# Patient Record
Sex: Female | Born: 1945
Health system: Southern US, Community
[De-identification: ages and names within clinical notes are randomized; demographics above are authoritative.]

## PROBLEM LIST (undated history)

## (undated) DIAGNOSIS — F419 Anxiety disorder, unspecified: Secondary | ICD-10-CM

## (undated) DIAGNOSIS — C50511 Malignant neoplasm of lower-outer quadrant of right female breast: Secondary | ICD-10-CM

## (undated) DIAGNOSIS — I340 Nonrheumatic mitral (valve) insufficiency: Secondary | ICD-10-CM

## (undated) DIAGNOSIS — I499 Cardiac arrhythmia, unspecified: Secondary | ICD-10-CM

## (undated) DIAGNOSIS — M1990 Unspecified osteoarthritis, unspecified site: Secondary | ICD-10-CM

## (undated) DIAGNOSIS — C50919 Malignant neoplasm of unspecified site of unspecified female breast: Secondary | ICD-10-CM

## (undated) DIAGNOSIS — R922 Inconclusive mammogram: Secondary | ICD-10-CM

## (undated) DIAGNOSIS — H269 Unspecified cataract: Secondary | ICD-10-CM

## (undated) DIAGNOSIS — E039 Hypothyroidism, unspecified: Secondary | ICD-10-CM

## (undated) DIAGNOSIS — I059 Rheumatic mitral valve disease, unspecified: Secondary | ICD-10-CM

## (undated) DIAGNOSIS — G629 Polyneuropathy, unspecified: Secondary | ICD-10-CM

## (undated) DIAGNOSIS — R011 Cardiac murmur, unspecified: Secondary | ICD-10-CM

## (undated) DIAGNOSIS — E079 Disorder of thyroid, unspecified: Secondary | ICD-10-CM

## (undated) DIAGNOSIS — R202 Paresthesia of skin: Secondary | ICD-10-CM

## (undated) DIAGNOSIS — G250 Essential tremor: Secondary | ICD-10-CM

## (undated) DIAGNOSIS — M5412 Radiculopathy, cervical region: Secondary | ICD-10-CM

## (undated) DIAGNOSIS — Z923 Personal history of irradiation: Secondary | ICD-10-CM

## (undated) DIAGNOSIS — G2581 Restless legs syndrome: Secondary | ICD-10-CM

## (undated) DIAGNOSIS — I491 Atrial premature depolarization: Secondary | ICD-10-CM

## (undated) DIAGNOSIS — R251 Tremor, unspecified: Secondary | ICD-10-CM

## (undated) DIAGNOSIS — I341 Nonrheumatic mitral (valve) prolapse: Secondary | ICD-10-CM

## (undated) DIAGNOSIS — C801 Malignant (primary) neoplasm, unspecified: Secondary | ICD-10-CM

## (undated) DIAGNOSIS — Z170 Estrogen receptor positive status [ER+]: Secondary | ICD-10-CM

## (undated) DIAGNOSIS — N3010 Interstitial cystitis (chronic) without hematuria: Secondary | ICD-10-CM

## (undated) DIAGNOSIS — I83891 Varicose veins of right lower extremities with other complications: Secondary | ICD-10-CM

## (undated) DIAGNOSIS — Z853 Personal history of malignant neoplasm of breast: Secondary | ICD-10-CM

## (undated) HISTORY — DX: Polyneuropathy, unspecified: G62.9

## (undated) HISTORY — PX: EYE SURGERY: SHX253

## (undated) HISTORY — DX: Palpitations: R00.2

## (undated) HISTORY — DX: Unspecified cataract: H26.9

## (undated) HISTORY — DX: Disorder of thyroid, unspecified: E07.9

## (undated) HISTORY — DX: Radiculopathy, cervical region: M54.12

## (undated) HISTORY — DX: Malignant neoplasm of lower-outer quadrant of right female breast: C50.511

## (undated) HISTORY — PX: TONSILLECTOMY: SUR1361

## (undated) HISTORY — DX: Essential tremor: G25.0

## (undated) HISTORY — DX: Atrial premature depolarization: I49.1

## (undated) HISTORY — PX: BREAST LUMPECTOMY: SHX2

## (undated) HISTORY — DX: Interstitial cystitis (chronic) without hematuria: N30.10

## (undated) HISTORY — DX: Nonrheumatic mitral (valve) insufficiency: I34.0

## (undated) HISTORY — PX: DILATION AND CURETTAGE OF UTERUS: SHX78

## (undated) HISTORY — DX: Tremor, unspecified: R25.1

## (undated) HISTORY — DX: Estrogen receptor positive status (ER+): Z17.0

## (undated) HISTORY — DX: Nonrheumatic mitral (valve) prolapse: I34.1

## (undated) HISTORY — PX: CORONARY ANGIOPLASTY: SHX604

## (undated) HISTORY — DX: Rheumatic mitral valve disease, unspecified: I05.9

## (undated) HISTORY — PX: CARDIAC CATHETERIZATION: SHX172

## (undated) HISTORY — DX: Restless legs syndrome: G25.81

## (undated) HISTORY — DX: Malignant neoplasm of unspecified site of unspecified female breast: C50.919

## (undated) HISTORY — PX: OTHER SURGICAL HISTORY: SHX169

## (undated) HISTORY — DX: Inconclusive mammogram: R92.2

## (undated) HISTORY — DX: Anxiety disorder, unspecified: F41.9

## (undated) HISTORY — DX: Unspecified osteoarthritis, unspecified site: M19.90

## (undated) HISTORY — DX: Paresthesia of skin: R20.2

## (undated) HISTORY — PX: SHOULDER ARTHROSCOPY: SHX128

## (undated) HISTORY — DX: Varicose veins of right lower extremity with other complications: I83.891

## (undated) HISTORY — DX: Personal history of malignant neoplasm of breast: Z85.3

## (undated) SURGICAL SUPPLY — 2 items
WATCHMAN FLX PRO PROCEDURE (KITS) ×1 IMPLANT
WATCHMAN TRUSTEER PROCEDURE (KITS) ×1 IMPLANT

---

## 1978-09-29 HISTORY — PX: MELANOMA EXCISION: SHX5266

## 1988-09-29 HISTORY — PX: HEMANGIOMA EXCISION: SHX1734

## 1998-10-17 ENCOUNTER — Other Ambulatory Visit: Admission: RE | Admit: 1998-10-17 | Discharge: 1998-10-17 | Payer: Self-pay | Admitting: Obstetrics and Gynecology

## 1999-10-24 ENCOUNTER — Other Ambulatory Visit: Admission: RE | Admit: 1999-10-24 | Discharge: 1999-10-24 | Payer: Self-pay | Admitting: Obstetrics and Gynecology

## 2002-11-28 ENCOUNTER — Other Ambulatory Visit: Admission: RE | Admit: 2002-11-28 | Discharge: 2002-11-28 | Payer: Self-pay | Admitting: Obstetrics & Gynecology

## 2004-01-22 ENCOUNTER — Other Ambulatory Visit: Admission: RE | Admit: 2004-01-22 | Discharge: 2004-01-22 | Payer: Self-pay | Admitting: Obstetrics & Gynecology

## 2005-01-30 ENCOUNTER — Other Ambulatory Visit: Admission: RE | Admit: 2005-01-30 | Discharge: 2005-01-30 | Payer: Self-pay | Admitting: Obstetrics & Gynecology

## 2007-07-07 ENCOUNTER — Ambulatory Visit: Payer: Self-pay | Admitting: Internal Medicine

## 2007-07-22 ENCOUNTER — Ambulatory Visit: Payer: Self-pay | Admitting: Internal Medicine

## 2007-09-30 HISTORY — PX: BREAST BIOPSY: SHX20

## 2007-11-15 ENCOUNTER — Ambulatory Visit: Payer: Self-pay | Admitting: Internal Medicine

## 2007-11-26 ENCOUNTER — Ambulatory Visit: Payer: Self-pay

## 2007-11-26 ENCOUNTER — Encounter: Payer: Self-pay | Admitting: Internal Medicine

## 2007-12-24 ENCOUNTER — Ambulatory Visit: Payer: Self-pay | Admitting: Internal Medicine

## 2007-12-28 ENCOUNTER — Encounter: Admission: RE | Admit: 2007-12-28 | Discharge: 2007-12-28 | Payer: Self-pay | Admitting: Internal Medicine

## 2007-12-29 ENCOUNTER — Ambulatory Visit: Payer: Self-pay | Admitting: Internal Medicine

## 2008-01-04 ENCOUNTER — Encounter: Payer: Self-pay | Admitting: Internal Medicine

## 2008-01-04 ENCOUNTER — Ambulatory Visit: Payer: Self-pay | Admitting: Internal Medicine

## 2008-02-04 ENCOUNTER — Encounter: Admission: RE | Admit: 2008-02-04 | Discharge: 2008-02-04 | Payer: Self-pay | Admitting: Internal Medicine

## 2008-07-11 ENCOUNTER — Encounter: Admission: RE | Admit: 2008-07-11 | Discharge: 2008-07-11 | Payer: Self-pay | Admitting: Obstetrics & Gynecology

## 2008-07-12 ENCOUNTER — Encounter: Admission: RE | Admit: 2008-07-12 | Discharge: 2008-07-12 | Payer: Self-pay | Admitting: Obstetrics & Gynecology

## 2008-07-12 ENCOUNTER — Encounter (INDEPENDENT_AMBULATORY_CARE_PROVIDER_SITE_OTHER): Payer: Self-pay | Admitting: Diagnostic Radiology

## 2008-07-20 ENCOUNTER — Encounter: Admission: RE | Admit: 2008-07-20 | Discharge: 2008-07-20 | Payer: Self-pay | Admitting: Obstetrics & Gynecology

## 2008-07-25 ENCOUNTER — Encounter: Admission: RE | Admit: 2008-07-25 | Discharge: 2008-07-25 | Payer: Self-pay | Admitting: General Surgery

## 2008-07-25 ENCOUNTER — Ambulatory Visit (HOSPITAL_BASED_OUTPATIENT_CLINIC_OR_DEPARTMENT_OTHER): Admission: RE | Admit: 2008-07-25 | Discharge: 2008-07-25 | Payer: Self-pay | Admitting: General Surgery

## 2008-07-25 ENCOUNTER — Encounter (INDEPENDENT_AMBULATORY_CARE_PROVIDER_SITE_OTHER): Payer: Self-pay | Admitting: General Surgery

## 2008-07-25 DIAGNOSIS — C50919 Malignant neoplasm of unspecified site of unspecified female breast: Secondary | ICD-10-CM

## 2008-07-25 HISTORY — DX: Malignant neoplasm of unspecified site of unspecified female breast: C50.919

## 2008-07-25 HISTORY — PX: BREAST LUMPECTOMY: SHX2

## 2008-08-01 ENCOUNTER — Ambulatory Visit: Payer: Self-pay | Admitting: Oncology

## 2008-08-16 ENCOUNTER — Encounter: Payer: Self-pay | Admitting: Internal Medicine

## 2008-08-16 LAB — CBC WITH DIFFERENTIAL/PLATELET
Basophils Absolute: 0 10*3/uL (ref 0.0–0.1)
EOS%: 3.8 % (ref 0.0–7.0)
Eosinophils Absolute: 0.2 10*3/uL (ref 0.0–0.5)
HGB: 13.7 g/dL (ref 11.6–15.9)
LYMPH%: 27 % (ref 14.0–48.0)
MCH: 30.2 pg (ref 26.0–34.0)
MCV: 88 fL (ref 81.0–101.0)
MONO%: 12.5 % (ref 0.0–13.0)
NEUT#: 2.6 10*3/uL (ref 1.5–6.5)
NEUT%: 56.1 % (ref 39.6–76.8)
Platelets: 289 10*3/uL (ref 145–400)
RDW: 13.6 % (ref 11.3–14.5)

## 2008-08-17 LAB — CANCER ANTIGEN 27.29: CA 27.29: 32 U/mL (ref 0–39)

## 2008-08-17 LAB — COMPREHENSIVE METABOLIC PANEL
AST: 25 U/L (ref 0–37)
Albumin: 4.5 g/dL (ref 3.5–5.2)
Alkaline Phosphatase: 74 U/L (ref 39–117)
BUN: 12 mg/dL (ref 6–23)
Creatinine, Ser: 0.77 mg/dL (ref 0.40–1.20)
Glucose, Bld: 69 mg/dL — ABNORMAL LOW (ref 70–99)
Potassium: 3.8 mEq/L (ref 3.5–5.3)
Total Bilirubin: 0.5 mg/dL (ref 0.3–1.2)

## 2008-08-17 LAB — VITAMIN D 25 HYDROXY (VIT D DEFICIENCY, FRACTURES): Vit D, 25-Hydroxy: 58 ng/mL (ref 30–89)

## 2008-08-18 ENCOUNTER — Ambulatory Visit: Admission: RE | Admit: 2008-08-18 | Discharge: 2008-11-05 | Payer: Self-pay | Admitting: Radiation Oncology

## 2008-10-13 ENCOUNTER — Ambulatory Visit: Payer: Self-pay | Admitting: Oncology

## 2008-10-17 ENCOUNTER — Encounter: Payer: Self-pay | Admitting: Internal Medicine

## 2008-10-23 ENCOUNTER — Ambulatory Visit: Payer: Self-pay | Admitting: Internal Medicine

## 2009-02-06 ENCOUNTER — Ambulatory Visit: Payer: Self-pay | Admitting: Oncology

## 2009-02-08 LAB — CBC WITH DIFFERENTIAL/PLATELET
BASO%: 0.2 % (ref 0.0–2.0)
EOS%: 1.7 % (ref 0.0–7.0)
LYMPH%: 19.8 % (ref 14.0–49.7)
MCH: 30.4 pg (ref 25.1–34.0)
MCHC: 33.9 g/dL (ref 31.5–36.0)
MONO#: 0.5 10*3/uL (ref 0.1–0.9)
RBC: 4.05 10*6/uL (ref 3.70–5.45)
WBC: 4.5 10*3/uL (ref 3.9–10.3)
lymph#: 0.9 10*3/uL (ref 0.9–3.3)

## 2009-02-09 LAB — COMPREHENSIVE METABOLIC PANEL
ALT: 11 U/L (ref 0–35)
AST: 18 U/L (ref 0–37)
Alkaline Phosphatase: 43 U/L (ref 39–117)
BUN: 11 mg/dL (ref 6–23)
Calcium: 9.2 mg/dL (ref 8.4–10.5)
Chloride: 108 mEq/L (ref 96–112)
Creatinine, Ser: 0.85 mg/dL (ref 0.40–1.20)
Potassium: 4.1 mEq/L (ref 3.5–5.3)

## 2009-02-09 LAB — CANCER ANTIGEN 27.29: CA 27.29: 20 U/mL (ref 0–39)

## 2009-02-15 ENCOUNTER — Encounter: Payer: Self-pay | Admitting: Internal Medicine

## 2009-02-23 ENCOUNTER — Encounter (INDEPENDENT_AMBULATORY_CARE_PROVIDER_SITE_OTHER): Payer: Self-pay | Admitting: *Deleted

## 2009-04-09 ENCOUNTER — Encounter: Payer: Self-pay | Admitting: Internal Medicine

## 2009-05-08 ENCOUNTER — Ambulatory Visit: Payer: Self-pay | Admitting: Internal Medicine

## 2009-05-08 DIAGNOSIS — I059 Rheumatic mitral valve disease, unspecified: Secondary | ICD-10-CM

## 2009-05-08 DIAGNOSIS — I491 Atrial premature depolarization: Secondary | ICD-10-CM

## 2009-05-08 DIAGNOSIS — I340 Nonrheumatic mitral (valve) insufficiency: Secondary | ICD-10-CM | POA: Insufficient documentation

## 2009-05-08 HISTORY — DX: Rheumatic mitral valve disease, unspecified: I05.9

## 2009-05-08 HISTORY — DX: Atrial premature depolarization: I49.1

## 2009-07-12 ENCOUNTER — Encounter: Admission: RE | Admit: 2009-07-12 | Discharge: 2009-07-12 | Payer: Self-pay | Admitting: Obstetrics & Gynecology

## 2009-07-16 ENCOUNTER — Ambulatory Visit: Payer: Self-pay | Admitting: Oncology

## 2009-07-18 ENCOUNTER — Encounter: Payer: Self-pay | Admitting: Internal Medicine

## 2009-07-18 LAB — RESEARCH LABS

## 2009-09-10 ENCOUNTER — Telehealth (INDEPENDENT_AMBULATORY_CARE_PROVIDER_SITE_OTHER): Payer: Self-pay | Admitting: *Deleted

## 2009-09-11 ENCOUNTER — Ambulatory Visit (HOSPITAL_COMMUNITY): Admission: RE | Admit: 2009-09-11 | Discharge: 2009-09-11 | Payer: Self-pay | Admitting: Neurosurgery

## 2009-10-30 HISTORY — PX: NECK SURGERY: SHX720

## 2009-11-09 ENCOUNTER — Encounter: Admission: RE | Admit: 2009-11-09 | Discharge: 2009-11-09 | Payer: Self-pay | Admitting: Neurosurgery

## 2010-02-18 ENCOUNTER — Ambulatory Visit: Payer: Self-pay | Admitting: Oncology

## 2010-02-19 LAB — CBC WITH DIFFERENTIAL/PLATELET
Basophils Absolute: 0 10*3/uL (ref 0.0–0.1)
EOS%: 2.9 % (ref 0.0–7.0)
HCT: 37.4 % (ref 34.8–46.6)
HGB: 12.8 g/dL (ref 11.6–15.9)
MCH: 30.9 pg (ref 25.1–34.0)
NEUT%: 60.6 % (ref 38.4–76.8)
lymph#: 1.3 10*3/uL (ref 0.9–3.3)

## 2010-02-20 LAB — COMPREHENSIVE METABOLIC PANEL
ALT: 17 U/L (ref 0–35)
Albumin: 4.3 g/dL (ref 3.5–5.2)
Alkaline Phosphatase: 42 U/L (ref 39–117)
Chloride: 104 mEq/L (ref 96–112)
Glucose, Bld: 85 mg/dL (ref 70–99)
Total Bilirubin: 0.4 mg/dL (ref 0.3–1.2)
Total Protein: 7.1 g/dL (ref 6.0–8.3)

## 2010-02-26 ENCOUNTER — Encounter: Payer: Self-pay | Admitting: Internal Medicine

## 2010-03-12 ENCOUNTER — Ambulatory Visit: Payer: Self-pay | Admitting: Internal Medicine

## 2010-04-03 ENCOUNTER — Ambulatory Visit (HOSPITAL_COMMUNITY): Admission: RE | Admit: 2010-04-03 | Discharge: 2010-04-03 | Payer: Self-pay | Admitting: Internal Medicine

## 2010-04-03 ENCOUNTER — Encounter: Payer: Self-pay | Admitting: Internal Medicine

## 2010-04-03 ENCOUNTER — Ambulatory Visit: Payer: Self-pay | Admitting: Internal Medicine

## 2010-04-03 ENCOUNTER — Ambulatory Visit: Payer: Self-pay | Admitting: Cardiology

## 2010-07-17 ENCOUNTER — Encounter: Admission: RE | Admit: 2010-07-17 | Discharge: 2010-07-17 | Payer: Self-pay | Admitting: Oncology

## 2010-08-15 ENCOUNTER — Ambulatory Visit: Payer: Self-pay | Admitting: Oncology

## 2010-08-19 LAB — CBC WITH DIFFERENTIAL/PLATELET
BASO%: 0.3 % (ref 0.0–2.0)
Basophils Absolute: 0 10*3/uL (ref 0.0–0.1)
EOS%: 2.3 % (ref 0.0–7.0)
HCT: 37.8 % (ref 34.8–46.6)
HGB: 12.7 g/dL (ref 11.6–15.9)
LYMPH%: 24.5 % (ref 14.0–49.7)
MCH: 30.7 pg (ref 25.1–34.0)
MCHC: 33.7 g/dL (ref 31.5–36.0)
MCV: 91.3 fL (ref 79.5–101.0)
MONO%: 11 % (ref 0.0–14.0)
NEUT%: 61.9 % (ref 38.4–76.8)
Platelets: 182 10*3/uL (ref 145–400)
lymph#: 1 10*3/uL (ref 0.9–3.3)

## 2010-08-19 LAB — COMPREHENSIVE METABOLIC PANEL
AST: 22 U/L (ref 0–37)
BUN: 13 mg/dL (ref 6–23)
Calcium: 9.7 mg/dL (ref 8.4–10.5)
Chloride: 105 mEq/L (ref 96–112)
Creatinine, Ser: 0.82 mg/dL (ref 0.40–1.20)
Total Bilirubin: 0.3 mg/dL (ref 0.3–1.2)

## 2010-08-28 ENCOUNTER — Encounter: Payer: Self-pay | Admitting: Internal Medicine

## 2010-10-31 NOTE — Assessment & Plan Note (Signed)
Summary: rov/jss  Medications Added VITAMIN D 1000 UNIT TABS (CHOLECALCIFEROL) 1 tab 4 days weekly * OMEGA 3 1000MG   (OMEGA-3 FATTY ACIDS) 1 tab by mouth two times a day MACRODANTIN 50 MG CAPS (NITROFURANTOIN MACROCRYSTAL) 1 tab by mouth once daily BIOTIN 5000 MCG CAPS (BIOTIN) 1 tab by mouth once daily MAGNESIUM 250 MG TABS (MAGNESIUM) 1 tab by mouth once daily * ELAVIL 25MG  1 tab by mouth once daily        Primary Provider:  Buren Kos MD  CC:  check up.  History of Present Illness: Kellie Humphrey is a very pleasant 65 year old woman with a history of mild mitral valve prolapse and associated PACs.   She had an exercise echo done for chest pain in 2009, this showed good exercise capacity with normal stress echo images.  She did have occasional PACs with brief 5-6 beat run of atrial tachycardia.   Last year we increased her lopressor due to palpitations. She returns for routine f/u. Feels good with few palpitations. Occasionally feels a hum with her heartbeat.  Walks 4 miles 4 days per week without problem. No CP or undue SOB. No edema, syncope, presyncope. Having hot flashes with Tamoxifen consider trying Neurontin.   Current Medications (verified): 1)  Metoprolol Tartrate 25 Mg Tabs (Metoprolol Tartrate) .[name].. 1 Tab Two Times A Day 2)  Aspirin 81 Mg Tbec (Aspirin) .... Take One Tablet By Mouth Daily 3)  Multivitamins   Tabs (Multiple Vitamin) .... Take One Tablet By Mouth Once Daily. 4)  Vitamin D 1000 Unit Tabs (Cholecalciferol) .[name].. 1 Tab 4 Days Weekly 5)  Omega 3 1000mg   (Omega-3 Fatty Acids) .[name].. 1 Tab By Mouth Two Times A Day 6)  Calcium Citrate   Powd (Calcium Citrate Tetrahydrate) .... 100mg  Daily 7)  Tamoxifen Citrate 20 Mg Tabs (Tamoxifen Citrate) .... Take One Tablet By Mouth Once Daily. 8)  Flexeril 10 Mg Tabs (Cyclobenzaprine Hcl) .... As Needed 9)  Macrodantin 50 Mg Caps (Nitrofurantoin Macrocrystal) .[name].. 1 Tab By Mouth Once Daily 10)  Biotin 5000 Mcg Caps (Biotin) .[name].. 1  Tab By Mouth Once Daily 11)  Magnesium 250 Mg Tabs (Magnesium) .[name].. 1 Tab By Mouth Once Daily 12)  Elavil 25mg  .... 1 Tab By Mouth Once Daily  Allergies: 1)  ! Codeine  Past History:  Past Medical History: Last updated: 05/08/2009 Breast CA s/p lumpectomy 2009     --adjuvant radiation      --on Tamoxifen Mitral valve prolapse Palpitations due to PACs  Past Surgical History: Last updated: 05/04/2009  DATE OF PROCEDURE:  07/25/2008   PROCEDURES:   1. Right breast wire-guided lumpectomy.   2. Right axillary sentinel lymph node biopsy with radioactive tracer       and methylene blue dye.     SURGEON:  Juanetta Gosling, MD   Review of Systems        As per HPI and past medical history; otherwise all systems negative.   Vital Signs:  Patient profile:   65 year old female Height:      68 inches Weight:      137 pounds BMI:     20.91 Pulse rate:   75 / minute Resp:     12 per minute BP sitting:   100 / 60  (left arm)  Vitals Entered By: Kem Parkinson (March 12, 2010 7:59 AM)  Physical Exam  General:  Thin. well appearing in no acute distress, ambulates around the clinic without any respiratory difficulty. HEENT:  Normal. NECK:  Supple.  There is no JVD.  Carotids are 2+ bilaterally without any bruits.  There is no lymphadenopathy or thyromegaly. CARDIAC:  PMI is nondisplaced.  She is regular rate and rhythm.  There is a very soft midsystolic click. no murmur LUNGS:  Clear. ABDOMEN:  Soft, nontender, and nondistended.  No hepatosplenomegaly.  No bruits.  No masses.  Good bowel sounds. EXTREMITIES:  Warm with no cyanosis, clubbing, or edema.  No rash. NEURO:  Alert and oriented x3.  Cranial nerves II through XII are intact.  Moves all 4 extremities without difficulty.  Affect is pleasant.   Impression & Recommendations:  Problem # 1:  PREMATURE ATRIAL CONTRACTIONS (ICD-427.61) Stable. Continue b-blocker.  Problem # 2:  MITRAL VALVE PROLAPSE (ICD-424.0) Stable  on exam. She is due for her repeat echo.   Other Orders: EKG w/ Interpretation (93000) Echocardiogram (Echo)  Patient Instructions: 1)  Your physician recommends that you schedule a follow-up appointment in: 79yr with Dr Gala Romney 2)  Your physician recommends that you continue on your current medications as directed. Please refer to the Current Medication list given to you today. 3)  Your physician has requested that you have an echocardiogram.  Echocardiography is a painless test that uses sound waves to create images of your heart. It provides your doctor with information about the size and shape of your heart and how well your heart's chambers and valves are working.  This procedure takes approximately one hour. There are no restrictions for this procedure.

## 2010-10-31 NOTE — Letter (Signed)
Summary: [facility]  [facility]   Imported By: Sherian Rein 09/17/2010 14:00:33  _____________________________________________________________________  External Attachment:    Type:   Image     Comment:   External Document

## 2010-10-31 NOTE — Progress Notes (Signed)
Summary: [facility]  [facility]   Imported By: Debby Freiberg 03/23/2010 10:04:08  _____________________________________________________________________  External Attachment:    Type:   Image     Comment:   External Document

## 2010-12-31 LAB — BASIC METABOLIC PANEL
BUN: 11 mg/dL (ref 6–23)
CO2: 28 mEq/L (ref 19–32)
Calcium: 9.6 mg/dL (ref 8.4–10.5)
Chloride: 105 mEq/L (ref 96–112)
Creatinine, Ser: 0.71 mg/dL (ref 0.4–1.2)
GFR calc Af Amer: >60 mL/min (ref >60)
GFR calc non Af Amer: >60 mL/min (ref >60)
Glucose, Bld: 98 mg/dL (ref 70–99)
Potassium: 4 mEq/L (ref 3.5–5.1)
Sodium: 141 mEq/L (ref 135–145)

## 2010-12-31 LAB — TYPE AND SCREEN

## 2010-12-31 LAB — DIFFERENTIAL
Basophils Relative: 1 % (ref 0–1)
Eosinophils Absolute: 0.1 10*3/uL (ref 0.0–0.7)
Monocytes Absolute: 0.6 10*3/uL (ref 0.1–1.0)
Monocytes Relative: 14 % — ABNORMAL HIGH (ref 3–12)
Neutro Abs: 2.1 10*3/uL (ref 1.7–7.7)

## 2010-12-31 LAB — CBC
Hemoglobin: 13.1 g/dL (ref 12.0–15.0)
MCHC: 34.6 g/dL (ref 30.0–36.0)
MCV: 91.7 fL (ref 78.0–100.0)
RBC: 4.13 MIL/uL (ref 3.87–5.11)

## 2010-12-31 LAB — ABO/RH: ABO/RH(D): A POS

## 2011-01-27 ENCOUNTER — Other Ambulatory Visit: Payer: Self-pay | Admitting: Internal Medicine

## 2011-02-11 ENCOUNTER — Other Ambulatory Visit: Payer: Self-pay | Admitting: Oncology

## 2011-02-11 ENCOUNTER — Encounter (HOSPITAL_BASED_OUTPATIENT_CLINIC_OR_DEPARTMENT_OTHER): Payer: BC Managed Care – PPO | Admitting: Oncology

## 2011-02-11 DIAGNOSIS — M949 Disorder of cartilage, unspecified: Secondary | ICD-10-CM

## 2011-02-11 DIAGNOSIS — C50319 Malignant neoplasm of lower-inner quadrant of unspecified female breast: Secondary | ICD-10-CM

## 2011-02-11 LAB — CBC WITH DIFFERENTIAL/PLATELET
Basophils Absolute: 0 10*3/uL (ref 0.0–0.1)
EOS%: 4.7 % (ref 0.0–7.0)
Eosinophils Absolute: 0.2 10*3/uL (ref 0.0–0.5)
HCT: 36.2 % (ref 34.8–46.6)
HGB: 12.3 g/dL (ref 11.6–15.9)
MCH: 30.5 pg (ref 25.1–34.0)
MONO#: 0.4 10*3/uL (ref 0.1–0.9)
NEUT#: 2.5 10*3/uL (ref 1.5–6.5)
NEUT%: 62.1 % (ref 38.4–76.8)
lymph#: 0.9 10*3/uL (ref 0.9–3.3)

## 2011-02-11 LAB — COMPREHENSIVE METABOLIC PANEL
Albumin: 4.3 g/dL (ref 3.5–5.2)
BUN: 19 mg/dL (ref 6–23)
CO2: 27 mEq/L (ref 19–32)
Calcium: 9.5 mg/dL (ref 8.4–10.5)
Chloride: 103 mEq/L (ref 96–112)
Creatinine, Ser: 0.76 mg/dL (ref 0.40–1.20)
Glucose, Bld: 100 mg/dL — ABNORMAL HIGH (ref 70–99)

## 2011-02-11 LAB — VITAMIN D 25 HYDROXY (VIT D DEFICIENCY, FRACTURES): Vit D, 25-Hydroxy: 61 ng/mL (ref 30–89)

## 2011-02-11 LAB — CANCER ANTIGEN 27.29: CA 27.29: 22 U/mL (ref 0–39)

## 2011-02-11 NOTE — Assessment & Plan Note (Signed)
[facility]                            CARDIOLOGY OFFICE NOTE   VEVERLY, LARIMER                       MRN:          [id number]  DATE:10/23/2008                            DOB:          1946/02/20    PRIMARY CARE PHYSICIAN:  Kari Baars, MD   INTERVAL HISTORY:  Kellie Humphrey is a very pleasant 65 year old woman with a  history of mild mitral valve prolapse and associated PACs.   She had an exercise test done for chest pain, this showed good exercise  capacity with normal stress echo images.  She did have occasional PACs  with brief 5-6 beat run of atrial tachycardia.   She returns today for routine followup.  She is doing well.  She remains  very active.  She does note that she continues to have brief  palpitations, but nothing prolonged.  Lopressure may have helped some.  She has not had any syncope or presyncope.  No dyspnea.  No chest pain.   REVIEW OF SYSTEMS:  Remainder of review of systems is negative except as  above.   CURRENT MEDICATIONS:  1. Multivitamin.  2. Vitamin D.  3. Aspirin 81.  4. Lopressor 25 a day.  5. Fish oil.  6. Calcium.  7. Amitriptyline 25 a day.   PHYSICAL EXAMINATION:  GENERAL:  She is well appearing in no acute  distress, ambulates around the clinic without any respiratory  difficulty.  VITAL SIGNS:  Blood pressure is 110/64, heart rate is 94, weight is 136.  HEENT:  Normal.  NECK:  Supple.  There is no JVD.  Carotids are 2+ bilaterally without  any bruits.  There is no lymphadenopathy or thyromegaly.  CARDIAC:  PMI is nondisplaced.  She is regular rate and rhythm.  There  is a midsystolic click with a very brief mitral regurgitation murmur at  the apex.  LUNGS:  Clear.  ABDOMEN:  Soft, nontender, and nondistended.  No hepatosplenomegaly.  No  bruits.  No masses.  Good bowel sounds.  EXTREMITIES:  Warm with no cyanosis, clubbing, or edema.  No rash.  NEURO:  Alert and oriented x3.  Cranial nerves II  through XII are  intact.  Moves all 4 extremities without difficulty.  Affect is  pleasant.   ASSESSMENT AND PLAN:  Mitral valve prolapse with associated premature  atrial contractions and brief atrial tachycardia.  I will increase her  Lopressor to 25 b.i.d. and continue to follow and reassure her that  these are relatively benign.  She will not given new guideline.  She  will not need endocarditis prophylaxis prior to dental procedures.   DISPOSITION:  We will see her back in 6 months for routine followup.     Kellie Humphrey. Bensimhon, MD  Electronically Signed    DRB/MedQ  DD: 10/23/2008  DT: 10/24/2008  Job #: [id number]

## 2011-02-11 NOTE — Assessment & Plan Note (Signed)
[facility]                         GASTROENTEROLOGY OFFICE NOTE   Kellie Humphrey, Kellie Humphrey                       MRN:          [id number]  DATE:12/29/2007                            DOB:          1946-03-04    REFERRING PHYSICIAN:  Kari Baars, M.D.   PROBLEM:  Chest burning and indigestion.   HISTORY:  Kellie Humphrey is a 65 year old female known to Dr. Stan Head from  prior colonoscopy which was done in October of 2008 for routine  screening.  This was a normal exam.  The patient says that she has been  having constant heartburn over the past 3 weeks.  She initially started  taking over-the-counter Prilosec, and her symptoms did not improve at  all.  She was then seen by Dr. Clelia Croft and placed on Protonix 40 mg b.i.d.  Again, after 1 week of Protonix, she does not feel any relief.  She has  no complaint of dysphagia or odynophagia.  She has noticed some  intermittent radiation to her mid-back.  She denies any new medications,  supplements, vitamins, etc., and has not been on any recent antibiotics.  She feels that the burning some times is transiently better while she is  eating and then seems to be exacerbated.  She also denies any regular  aspirin or NSAID use.  She had a recent ultrasound done that showed 2  gallbladder polyps, was otherwise negative.   CURRENT MEDICATIONS:  1. Multivitamin daily.  2. Vitamin D.  3. Fish oil.  4. Aspirin 81 mg daily.  5. Protonix 40 mg twice daily.  6. Prempro 1 p.o. daily.  7. Lopressor 24 daily.   ALLERGIES/INTOLERANCES:  CODEINE WITH NAUSEA AND VOMITING.   FAMILY HISTORY:  Negative for colon cancer, polyps, or inflammatory  bowel disease.   PAST MEDICAL HISTORY:  1. Mitral valve prolapse.  2. Melanoma on her right thigh approximately 25 years ago which was      removed.  3. No prior abdominal surgeries.   SOCIAL HISTORY:  The patient is married.  She has 2 children.  She is a  nonsmoker.  She drinks  alcohol socially.   REVIEW OF SYSTEMS:  10-point review of systems is entirely negative,  other than GI as mentioned above.   PHYSICAL EXAMINATION:  GENERAL:  Well-developed white female in no acute  distress.  VITAL SIGNS:  Height 5 feet 8 inches, weight 132.4.  Blood pressure  104/72, pulse 88.  HEENT:  Atraumatic, normocephalic.  EOMI.  PERRLA.  Sclerae anicteric.  NECK:  Supple.  CARDIOVASCULAR:  Regular rate and rhythm with S1 and S2.  No murmurs,  rubs, or gallops.  PULMONARY:  Clear to A&P.  ABDOMEN:  Soft and nontender.  There is no palpable mass or  hepatosplenomegaly.  Bowel sounds are active.  RECTAL:  Exam was not done at this time.   IMPRESSION:  A 65 year old white female with 3-week history of constant  heartburn unrelieved at present with twice daily proton pump inhibitors.  Rule out acute esophagitis, rule out infectious esophagitis.   PLAN:  1. Schedule upper endoscopy with  Dr. Leone Payor as soon as possible.  2. Continue b.i.d. Protonix.  3. Add Carafate slurry 1 gram between meals and at bedtime.      Mike Gip, PA-C  Electronically Signed      Wilhemina Bonito. Marina Goodell, MD  Electronically Signed   AE/MedQ  DD: 01/03/2008  DT: 01/03/2008  Job #: [id number]   cc:   Kari Baars, M.D.  Iva Boop, MD,FACG

## 2011-02-11 NOTE — Assessment & Plan Note (Signed)
[facility]                            CARDIOLOGY OFFICE NOTE   Ahmoni, Edge GENEVIEVE ARBAUGH                       MRN:          [id number]  DATE:12/24/2007                            DOB:          05-Feb-1946    INTERVAL HISTORY:  Ms. Wurtz is a delightful 65 year old woman with  history of mitral valve prolapse and associated PACs.  She also has a  history of melanoma.  Otherwise, her medical history is fairly benign.   We saw her in consultation last month for dyspnea with irregular  heartbeat as well as abnormal EKG.  Since that time, she underwent  placement of a Holter monitor and a stress echocardiogram.  She had  excellent exercise capacity exercising for 11 minutes on a Bruce  protocol.  Stress echo was normal.  No evidence of ischemia.  Her  baseline echo showed an EF of 55-65% with mild bileaflet prolapse and  mild mitral regurgitation.  Holter monitor showed PACs with occasional  brief runs, 5-6 beats of atrial tachycardia.   She returns today.  She says she is feeling better.  Her shortness of  breath and lightheadedness have markedly improved.  She does  occasionally have irregular heartbeats, but these are either 5-6 beats  or less.  There have been no prolonged arrhythmias.  No syncope.  She  does have what she calls sort of a diffuse chest pain which she thinks  may be related to reflux.  Recently her PPI was changed to see if this  would help.  Otherwise, she is doing well.   CURRENT MEDICATIONS:  1. Multivitamin.  2. Vitamin D.  3. Fish oil 1000 a day.  4. Aspirin 81 a day.  5. Protonix 80 a day.  6. Prempro.   PHYSICAL EXAMINATION:  GENERAL:  She is well-appearing in no acute  distress.  She ambulates around the clinic without any respiratory  difficulty.  VITAL SIGNS:  Blood pressure 110/78, heart rate 80, weight is 133.  HEENT:  Normal.  NECK:  Supple.  There is no JVD.  Carotids are 2+ bilaterally without  any bruits.  There is  no lymphadenopathy or thyromegaly.  CARDIAC:  She has a regular rate and rhythm.  PMI is nondisplaced.  She  has a midsystolic click with no audible murmur.  LUNGS:  Clear.  ABDOMEN:  Soft, nontender, nondistended.  There is no  hepatosplenomegaly, no bruits, no masses.  Good bowel sounds.  EXTREMITIES:  Warm with no cyanosis, clubbing or edema.  No rash.  NEURO:  Alert and oriented x3.  Cranial nerves II-XII are intact.  Moves  all 4 extremities without difficulty.  Affect is pleasant.   ASSESSMENT/PLAN:  1. Palpitations.  Her Holter monitor shows clear episodes of PACs and      brief atrial tachycardia.  This is benign.  We have given her a      prescription for Lopressor 25 mg a day to take as needed.  I have      reminded her of the need to avoid stress as much as possible and  avoid caffeine as well which she is already doing.  2. Chest pain.  I suspect given her excellent exercise capacity and      normal stress echo, I suspect this is noncardiac.  However, should      it continue and another cause is not identifiable, we could      consider cardiac catheterization down the road, but I would like to      exhaust other avenues first as I think significant coronary artery      disease is fairly unlikely here.   DISPOSITION:  We will see her back in 6 months for a routine followup.     Bevelyn Buckles. Bensimhon, MD  Electronically Signed    DRB/MedQ  DD: 12/24/2007  DT: 12/25/2007  Job #: [id number]   cc:   Kari Baars, M.D.

## 2011-02-11 NOTE — Op Note (Signed)
Kellie Humphrey, Kellie Humphrey                ACCOUNT NO.:  [id number]   MEDICAL RECORD NO.:  [id number]          PATIENT TYPE:  AMB   LOCATION:  DSC                          FACILITY:  [facility]   PHYSICIAN:  Juanetta Gosling, MDDATE OF BIRTH:  Aug 26, 1946   DATE OF PROCEDURE:  07/25/2008  DATE OF DISCHARGE:                               OPERATIVE REPORT   PREOPERATIVE DIAGNOSIS:  Right breast cancer.   POSTOPERATIVE DIAGNOSIS:  Right breast cancer.   PROCEDURES:  1. Right breast wire-guided lumpectomy.  2. Right axillary sentinel lymph node biopsy with radioactive tracer      and methylene blue dye.   SURGEON:  Juanetta Gosling, MD   ASSISTANTS:  None.   ANESTHESIA:  General.   SPECIMENS:  Sentinel node x2 from the right axilla with counts in the  1300 range.  The bed was in the 10-20 range.  Left breast lumpectomy  with wire oriented appropriately to Pathology.  Touch prep of the  axillary nodes was negative for tumor.   ESTIMATED BLOOD LOSS:  Minimal.   COMPLICATIONS:  None.   DRAINS:  None.   DISPOSITION:  To PACU in stable condition.   INDICATIONS:  She is a 65 year old female who is otherwise healthy who  was found to have on her yearly screening mammogram, a hypoechoic mass  of the 4'o clock position measuring 1.2 cm side ultrasound-guided core  biopsy showed a low-grade invasive ductal carcinoma with low-grade DCIS.  We discussed a right breast wire-guided lumpectomy and a sentinel lymph  node biopsy.   PROCEDURE:  After informed consent was obtained, the patient was taken  to the operating room.  She was administered 1 g of intravenous Ancef.  She was then placed under general endotracheal anesthesia.  She  previously during the day had had a wire placed in the lesion as well as  radioactive tracer injected into the right breast.  After she was  asleep, I noted an area with the NeoProbe that appeared to have a  sentinel node in her right axilla.  Her right breast  and axilla were  then prepped and draped in a standard sterile surgical fashion.  A 2 mL  of methylene blue was then injected at four positions around the nipple  without complication.  This was massaged and allowed to spread.  I then  identified the sentinel node with the NeoProbe.  A 1.5 cm incision was  then made in her axilla.  Dissection carried out down to the level of  the axillary fascia.  The NeoProbe was then used to identify an area of  increased activity.  There was one node that was noted to be hot and  blue.  This was removed and passed off the table as a specimen, and  counts of this were approximately 1300.  There was still some activity  remaining and I removed one further specimen, which what the counts were  approximately in the 200 range.  Following this, the bed counts were in  the 10-20 range with no other further activity.  There were no other  abnormalities and no other blue nodes that I noted during this portion  of procedure.  These were sent to pathologist.  They were negative on  touch prep.  The axillary fascia was then closed with 3-0 Vicryl and  skin closed with 4-0 Monocryl, this layer was closed with Dermabond for  the skin.  The breast was then approached and approximately 4.5-cm  incision was made in the inferior portion of her right breast overlying  where the tumor was as well as the wire.  The mammograms were available  for my review in the operating room.  Dissection was carried out  inferiorly, superiorly, laterally, and medially with flaps.  The tumor  was completely encircled with a margin of normal tissue.  This was then  taken down to the muscle and the fascia was removed in total.  The wire  was brought into the wound.  The lumpectomy specimen was then passed off  the table and specimen after being appropriately marked.  The Faxitron  was then used to confirm removal, which it did.  Hemostasis was  observed.  I closed her breast tissue to  attempt to maintain her shape  with a 3-0 Vicryl.  I then closed the dermis with a 4-0 Vicryl and the  skin with a 4-0 Monocryl in a subcuticular fashion.  I infiltrated 10 mL  of 0.25% Marcaine mixed with 1% lidocaine without epinephrine into the  breast tumor as well into axillary wound.  The Steri-Strips and a  sterile drapes were placed over the breast wound.  She tolerated this  well, was transferred to the PACU in stable condition.      Juanetta Gosling, MD  Electronically Signed     MCW/MEDQ  D:  07/25/2008  T:  07/26/2008  Job:  [id number]   cc:   Kari Baars, M.D.  Freddy Finner, M.D.  Daryl Eastern, M.D.

## 2011-02-11 NOTE — Assessment & Plan Note (Signed)
[facility]                            CARDIOLOGY OFFICE NOTE   Kellie Humphrey, Kellie Humphrey                       MRN:          [id number]  DATE:11/15/2007                            DOB:          1945-12-11    REASON FOR CONSULTATION:  Dyspnea and irregular heartbeat.   HISTORY OF PRESENT ILLNESS:  Kellie Humphrey is a delightful 65 year old woman  with history of mitral valve prolapse and trivial mitral regurgitation.  She also has a history of melanoma.  Otherwise her medical history is  relatively benign.  She denies any history of hypertension,  hyperlipidemia, diabetes and known coronary artery disease.  She tells  me that about 30 years ago she experienced irregular heartbeat and she  wore a monitor at that time, was told she had some skipped beat.  In  2002 she developed some chest pain.  She underwent what appeared to be a  stress echocardiogram which was normal.  There was mild mitral valve  prolapse with trivial mitral regurgitation.  The chest pain eventually  waned in the several months and she has not had a problem with it.   Over the past few weeks she has had 15 or 20 episodes of transient  dyspnea.  She says that this she gets short of breath and cannot catch  her breath.  This lasts about 30 seconds and then resolves.  It usually  occurs at rest.  There is really no pattern to it.  She occasionally  feels a little bit flushed and then it resolves.  After she feels like  she may have a little bit of a cold sweat.  She occasionally has some  chest tightness with this.  She denies any palpitations during these  episodes.  However, she does note that she is more aware of her  heartbeat has frequent skipped beats.  She did have one episode where  she woke up at night and the first sensation she felt was almost  presyncopal feeling.  This has not recurred.  She has not had any other  presyncope or syncope.   At baseline, she is very active.  She  walks 4 miles several times a week  at a brisk pace about four miles an hour.  She has not noticed any  change in exercise capacity.  She denies any significant caffeine  intake.  She does not feel that she is under an inordinate amount of  stress.  She saw Dr. Clelia Croft was referred over here for further evaluation.  Of note, in his office, EKG showed some mild nonspecific ST depression  which was more prominent from previous.  TSH was normal.   On review of systems are Caspar HPI and problem list.  Otherwise, only  notable for allergies and hay fever.  She does not have any wheezing and  no cough.  No fevers, chills, weight loss or night sweats.   PAST MEDICAL HISTORY:  1. Mitral valve prolapse.  2. Palpitations.  3. Postmenopausal.   CURRENT MEDICATIONS:  Multivitamin, calcium, omega 3, vitamin D, low-  dose aspirin, and hormone  replacement.   ALLERGIES:  CODEINE.   SOCIAL HISTORY:  She is married with 2 kids.  She is a housewife.  No  tobacco social alcohol.   FAMILY HISTORY:  Mother is alive at 64.  She has a history hypertension,  atrial fibrillation.  Father is alive at 83.  He is okay.  Sister is 3  is okay.  There is no family history of premature coronary artery  disease.   PHYSICAL EXAM:  She is well-appearing no acute distress ambulates  without any respiratory difficulty.  Blood pressure is 116/70.  Heart rate is 86.  HEENT is normal.  Neck is supple.  There is no JVD.  Carotids 2+ bilateral without bruits.  There is no lymphadenopathy or thyromegaly.  CARDIAC:  PMI is nondisplaced.  Regular rate and rhythm with no murmur  or rub.  There does appear to be a mid systolic click.  Lungs are clear.  ABDOMEN:  Soft, nontender, nondistended.  No hepatosplenomegaly.  No  bruits, no masses.  Good bowel sounds.  EXTREMITIES:  Warm with no cyanosis, clubbing or edema.  No rash.  NEURO:  Alert and x3.  Cranial nerves II-XII intact.  Moves all four  extremities without  difficulty.  Affect is pleasant.   EKG shows normal sinus rhythm at a rate of 86.  There is normal axis and  intervals.  No pre-excitation minimal ST depression diffusely.   ASSESSMENT/PLAN:  Paroxysmal dyspnea and occasional chest tightness.  Given her history of mitral valve prolapse I am most concerned for  possible underlying atrial arrhythmia.  We will put 2-week event monitor  Holter monitor on her and also check a stress echocardiogram to evaluate  for structural heart disease as well as possible ischemia.  We discussed  the possibility of rare processes like pheochromocytoma or possible  carcinoid given her flushing, but I think these are quite unlikely.  We  can consider these at a further workup by down the road.   DISPOSITION:  Will see her back in several weeks for routine follow-up.     Bevelyn Buckles. Bensimhon, MD  Electronically Signed    DRB/MedQ  DD: 11/15/2007  DT: 11/16/2007  Job #: [id number]

## 2011-02-18 ENCOUNTER — Encounter (HOSPITAL_BASED_OUTPATIENT_CLINIC_OR_DEPARTMENT_OTHER): Payer: BC Managed Care – PPO | Admitting: Oncology

## 2011-02-18 DIAGNOSIS — Z170 Estrogen receptor positive status [ER+]: Secondary | ICD-10-CM

## 2011-02-18 DIAGNOSIS — C50319 Malignant neoplasm of lower-inner quadrant of unspecified female breast: Secondary | ICD-10-CM

## 2011-02-18 DIAGNOSIS — I059 Rheumatic mitral valve disease, unspecified: Secondary | ICD-10-CM

## 2011-07-01 LAB — COMPREHENSIVE METABOLIC PANEL
Albumin: 3.6
Alkaline Phosphatase: 55
BUN: 9
CO2: 30
Chloride: 106
Creatinine, Ser: 0.69
GFR calc non Af Amer: >60
Glucose, Bld: 75
Total Bilirubin: 0.5

## 2011-07-01 LAB — LACTATE DEHYDROGENASE: LDH: 143

## 2011-07-01 LAB — DIFFERENTIAL
Basophils Absolute: 0
Basophils Relative: 1
Lymphocytes Relative: 25
Neutro Abs: 2.6
Neutrophils Relative %: 63

## 2011-07-01 LAB — CBC
HCT: 39.7
MCHC: 34
MCV: 89.5
RBC: 4.44

## 2011-07-01 LAB — CEA: CEA: <0.5

## 2011-07-15 ENCOUNTER — Other Ambulatory Visit: Payer: Self-pay | Admitting: Oncology

## 2011-07-15 DIAGNOSIS — Z9889 Other specified postprocedural states: Secondary | ICD-10-CM

## 2011-07-15 DIAGNOSIS — Z853 Personal history of malignant neoplasm of breast: Secondary | ICD-10-CM

## 2011-07-30 ENCOUNTER — Ambulatory Visit
Admission: RE | Admit: 2011-07-30 | Discharge: 2011-07-30 | Disposition: A | Discharge status: Home / Self Care | Payer: BC Managed Care – PPO | Source: Ambulatory Visit | Attending: Oncology | Admitting: Oncology

## 2011-07-30 DIAGNOSIS — Z9889 Other specified postprocedural states: Secondary | ICD-10-CM

## 2011-07-30 DIAGNOSIS — Z853 Personal history of malignant neoplasm of breast: Secondary | ICD-10-CM

## 2011-08-04 ENCOUNTER — Encounter: Payer: Self-pay | Admitting: Internal Medicine

## 2011-08-04 ENCOUNTER — Other Ambulatory Visit: Payer: Self-pay | Admitting: Oncology

## 2011-08-04 ENCOUNTER — Other Ambulatory Visit (HOSPITAL_BASED_OUTPATIENT_CLINIC_OR_DEPARTMENT_OTHER): Payer: BC Managed Care – PPO | Admitting: Lab

## 2011-08-04 DIAGNOSIS — Z170 Estrogen receptor positive status [ER+]: Secondary | ICD-10-CM

## 2011-08-04 DIAGNOSIS — C50319 Malignant neoplasm of lower-inner quadrant of unspecified female breast: Secondary | ICD-10-CM

## 2011-08-04 LAB — CBC WITH DIFFERENTIAL/PLATELET
Basophils Absolute: 0 10*3/uL (ref 0.0–0.1)
EOS%: 3.5 % (ref 0.0–7.0)
Eosinophils Absolute: 0.1 10*3/uL (ref 0.0–0.5)
HGB: 12.8 g/dL (ref 11.6–15.9)
NEUT#: 2.4 10*3/uL (ref 1.5–6.5)
RBC: 4.26 10*6/uL (ref 3.70–5.45)
RDW: 14 % (ref 11.2–14.5)
lymph#: 1.1 10*3/uL (ref 0.9–3.3)

## 2011-08-05 ENCOUNTER — Ambulatory Visit (INDEPENDENT_AMBULATORY_CARE_PROVIDER_SITE_OTHER): Payer: BC Managed Care – PPO | Admitting: Internal Medicine

## 2011-08-05 ENCOUNTER — Encounter: Payer: Self-pay | Admitting: Internal Medicine

## 2011-08-05 VITALS — BP 100/68 | HR 67 | Ht 67.5 in | Wt 138.8 lb

## 2011-08-05 DIAGNOSIS — I341 Nonrheumatic mitral (valve) prolapse: Secondary | ICD-10-CM

## 2011-08-05 DIAGNOSIS — R002 Palpitations: Secondary | ICD-10-CM

## 2011-08-05 DIAGNOSIS — I491 Atrial premature depolarization: Secondary | ICD-10-CM

## 2011-08-05 DIAGNOSIS — I059 Rheumatic mitral valve disease, unspecified: Secondary | ICD-10-CM

## 2011-08-05 LAB — COMPREHENSIVE METABOLIC PANEL
AST: 24 U/L (ref 0–37)
Albumin: 4.4 g/dL (ref 3.5–5.2)
Alkaline Phosphatase: 68 U/L (ref 39–117)
BUN: 17 mg/dL (ref 6–23)
Calcium: 9.6 mg/dL (ref 8.4–10.5)
Chloride: 102 mEq/L (ref 96–112)
Glucose, Bld: 78 mg/dL (ref 70–99)
Potassium: 4 mEq/L (ref 3.5–5.3)
Sodium: 140 mEq/L (ref 135–145)
Total Protein: 6.9 g/dL (ref 6.0–8.3)

## 2011-08-05 NOTE — Patient Instructions (Signed)
The current medical regimen is effective;  continue present plan and medications.  Your physician has requested that you have an echocardiogram in 1 year. Echocardiography is a painless test that uses sound waves to create images of your heart. It provides your doctor with information about the size and shape of your heart and how well your heart's chambers and valves are working. This procedure takes approximately one hour. There are no restrictions for this procedure.  Follow up in 1 year with Dr Gala Romney in 1 year in the CHF  clinic.  You will receive a letter in the mail 2 months before you are due.  Please call us when you receive this letter to schedule your follow up appointment.

## 2011-08-05 NOTE — Assessment & Plan Note (Signed)
Occasional palpitations. Doing well with b-blockers. Contact us if prolonged palpitations.

## 2011-08-05 NOTE — Assessment & Plan Note (Signed)
Stable. Will f/u echo every 2 years unless develops symptoms.

## 2011-08-05 NOTE — Progress Notes (Signed)
HPI:  Kellie Humphrey is a very pleasant 65 year old woman with a history of mild mitral valve prolapse and associated PACs.  She had an exercise echo done for chest pain in 2009, this showed good exercise capacity with normal stress echo images.  She did have occasional PACs with brief 5-6 beat run of atrial tachycardia.Last year we increased her lopressor due to palpitations.  She returns for routine f/u. Feels good with brief palpitations a couple of times per day.   Walks 4 miles 4 days per week without problem. No CP or undue SOB. No edema, syncope, presyncope. BP 90-100 but asymptomatic.   ROS: All systems negative except as listed in HPI, PMH and Problem List.  Past Medical History  Diagnosis Date  . Breast cancer     s/p lumpectomy  . MVP (mitral valve prolapse)   . PAC (premature atrial contraction)   . Palpitations     Current Outpatient Prescriptions  Medication Sig Dispense Refill  . aspirin 81 MG tablet Take 81 mg by mouth daily.        . Biotin 5000 MCG CAPS Take 5,000 mcg by mouth daily.        . calcium carbonate (OS-CAL) 600 MG TABS Take 600 mg by mouth daily.        . cholecalciferol (VITAMIN D) 1000 UNITS tablet Take 1,000 Units by mouth daily. Taking 3 Days a Week      . DULoxetine (CYMBALTA) 60 MG capsule Take 60 mg by mouth daily.        . fish oil-omega-3 fatty acids 1000 MG capsule Take 2 g by mouth daily.        [name] gabapentin (NEURONTIN) 300 MG capsule Take 300 mg by mouth daily.        [name] letrozole (FEMARA) 2.5 MG tablet Take 2.5 mg by mouth daily.        . metoprolol tartrate (LOPRESSOR) 25 MG tablet TAKE ONE TABLET BY MOUTH TWICE DAILY  60 tablet  6  . multivitamin (THERAGRAN) per tablet Take 1 tablet by mouth daily.        . Nutritional Supplements (MELATONIN PO) Take by mouth daily.           PHYSICAL EXAM: Filed Vitals:   08/05/11 0900  BP: 100/68  Pulse: 67   General:  Well appearing. No resp difficulty HEENT: normal Neck: supple. JVP flat. Carotids  2+ bilaterally; no bruits. No lymphadenopathy or thryomegaly appreciated. Cor: PMI normal. Regular rate & rhythm. No rubs, gallops. + mid-systolic click and 2/6 MR Lungs: clear Abdomen: soft, nontender, nondistended. No hepatosplenomegaly. No bruits or masses. Good bowel sounds. Extremities: no cyanosis, clubbing, rash, edema Neuro: alert & orientedx3, cranial nerves grossly intact. Moves all 4 extremities w/o difficulty. Affect pleasant.    ECG:  NSR 67 No ST-T wave abnormalities.     ASSESSMENT & PLAN:

## 2011-08-11 ENCOUNTER — Ambulatory Visit (HOSPITAL_BASED_OUTPATIENT_CLINIC_OR_DEPARTMENT_OTHER): Payer: BC Managed Care – PPO | Admitting: Oncology

## 2011-08-11 ENCOUNTER — Telehealth: Payer: Self-pay | Admitting: *Deleted

## 2011-08-11 VITALS — BP 102/66 | HR 73 | Temp 97.5°F | Ht 68.0 in | Wt 142.0 lb

## 2011-08-11 DIAGNOSIS — C50919 Malignant neoplasm of unspecified site of unspecified female breast: Secondary | ICD-10-CM

## 2011-08-11 DIAGNOSIS — C50319 Malignant neoplasm of lower-inner quadrant of unspecified female breast: Secondary | ICD-10-CM

## 2011-08-11 DIAGNOSIS — Z170 Estrogen receptor positive status [ER+]: Secondary | ICD-10-CM

## 2011-08-11 NOTE — Progress Notes (Signed)
ID: Willaim Rayas   Interval History: Kellie Humphrey today for followup of her breast cancer she is studying up on "spirited children" as she has a 63-year-old grandchild who is different. 18 and is very intolerant of transitions and irritants such as itchy". Family but she seems to be to make the very best of it.  She had gone off letrozole because of aches and pains but found that her aches and pains really did not get any better so she ended up going back on the letrozole is tolerating it just like before and overall tells me she is doing "great". She is looking forward to the holidays which she plans to and with family.    ROS:   For her aches and pains she takes ibuprofen 400 mg twice daily. She is not on a proton pump inhibitor. Assert palpitations of which is CA with any shortness of breath or chest pressure and which have not increased in frequency or intensity, she thinks she needs glasses, and she has some mild hot flashes for which any medication she is not having any gastritis nausea vomiting or abdominal discomfort at detailed review of systems today was otherwise entirely unremarkable  . Medications: I have reviewed the patient's current medications.   Current Outpatient Prescriptions  Medication Sig Dispense Refill  . aspirin 81 MG tablet Take 81 mg by mouth daily.        . Biotin 5000 MCG CAPS Take 5,000 mcg by mouth daily.        . calcium carbonate (OS-CAL) 600 MG TABS Take 600 mg by mouth daily.        . cholecalciferol (VITAMIN D) 1000 UNITS tablet Take 1,000 Units by mouth daily. Taking 3 Days a Week      . DULoxetine (CYMBALTA) 60 MG capsule Take 60 mg by mouth daily.        . fish oil-omega-3 fatty acids 1000 MG capsule Take 2 g by mouth daily.        [name] gabapentin (NEURONTIN) 300 MG capsule Take 300 mg by mouth daily.        [name] letrozole (FEMARA) 2.5 MG tablet Take 2.5 mg by mouth daily.        . metoprolol tartrate (LOPRESSOR) 25 MG tablet TAKE ONE TABLET BY MOUTH TWICE DAILY   60 tablet  6  . multivitamin (THERAGRAN) per tablet Take 1 tablet by mouth daily.        . Nutritional Supplements (MELATONIN PO) Take by mouth daily.           Objective: Vital signs in last 24 hours: BP 102/66  Pulse 73  Temp(Src) 97.5 F (36.4 C) (Oral)  Ht 5\' 8"  (1.727 m)  Wt 142 lb (64.411 kg)  BMI 21.59 kg/m2   Physical Exam:    Sclerae unicteric  Oropharynx clear  No peripheral adenopathy  Lungs clear -- no rales or rhonchi  Heart regular rate and rhythm  Abdomen benign  MSK no focal spinal tenderness, no peripheral edema  Neuro nonfocal  Breast exam: This is status post lumpectomy with very good cosmetic result it is a "lumpy" breast, but not change from prior left breast no suspicious masses  Lab Results:   BMET    Component Value Date/Time   NA 140 08/04/2011 1416   NA 140 08/04/2011 1416   NA 140 08/04/2011 1416   K 4.0 08/04/2011 1416   K 4.0 08/04/2011 1416   K 4.0 08/04/2011 1416   CL 102  08/04/2011 1416   CL 102 08/04/2011 1416   CL 102 08/04/2011 1416   CO2 29 08/04/2011 1416   CO2 29 08/04/2011 1416   CO2 29 08/04/2011 1416   GLUCOSE 78 08/04/2011 1416   GLUCOSE 78 08/04/2011 1416   GLUCOSE 78 08/04/2011 1416   BUN 17 08/04/2011 1416   BUN 17 08/04/2011 1416   BUN 17 08/04/2011 1416   CREATININE 0.79 08/04/2011 1416   CREATININE 0.79 08/04/2011 1416   CREATININE 0.79 08/04/2011 1416   CALCIUM 9.6 08/04/2011 1416   CALCIUM 9.6 08/04/2011 1416   CALCIUM 9.6 08/04/2011 1416   GFRNONAA >60 09/10/2009 0918   GFRAA  Value: >60        The eGFR has been calculated using the MDRD equation. This calculation has not been validated in all clinical situations. eGFR's persistently <60 mL/min signify possible Chronic Kidney Disease. 09/10/2009 0918     CMP     Component Value Date/Time   NA 140 08/04/2011 1416   NA 140 08/04/2011 1416   NA 140 08/04/2011 1416   K 4.0 08/04/2011 1416   K 4.0 08/04/2011 1416   K 4.0 08/04/2011 1416   CL 102 08/04/2011 1416   CL 102 08/04/2011  1416   CL 102 08/04/2011 1416   CO2 29 08/04/2011 1416   CO2 29 08/04/2011 1416   CO2 29 08/04/2011 1416   GLUCOSE 78 08/04/2011 1416   GLUCOSE 78 08/04/2011 1416   GLUCOSE 78 08/04/2011 1416   BUN 17 08/04/2011 1416   BUN 17 08/04/2011 1416   BUN 17 08/04/2011 1416   CREATININE 0.79 08/04/2011 1416   CREATININE 0.79 08/04/2011 1416   CREATININE 0.79 08/04/2011 1416   CALCIUM 9.6 08/04/2011 1416   CALCIUM 9.6 08/04/2011 1416   CALCIUM 9.6 08/04/2011 1416   PROT 6.9 08/04/2011 1416   PROT 6.9 08/04/2011 1416   PROT 6.9 08/04/2011 1416   ALBUMIN 4.4 08/04/2011 1416   ALBUMIN 4.4 08/04/2011 1416   ALBUMIN 4.4 08/04/2011 1416   AST 24 08/04/2011 1416   AST 24 08/04/2011 1416   AST 24 08/04/2011 1416   ALT 20 08/04/2011 1416   ALT 20 08/04/2011 1416   ALT 20 08/04/2011 1416   ALKPHOS 68 08/04/2011 1416   ALKPHOS 68 08/04/2011 1416   ALKPHOS 68 08/04/2011 1416   BILITOT 0.4 08/04/2011 1416   BILITOT 0.4 08/04/2011 1416   BILITOT 0.4 08/04/2011 1416   GFRNONAA >60 09/10/2009 0918   GFRAA  Value: >60        The eGFR has been calculated using the MDRD equation. This calculation has not been validated in all clinical situations. eGFR's persistently <60 mL/min signify possible Chronic Kidney Disease. 09/10/2009 0918    CBC    Component Value Date/Time   WBC 4.0 09/10/2009 0918   RBC 4.13 09/10/2009 0918   HGB 12.8 08/04/2011 1416   HGB 13.1 09/10/2009 0918   HCT 38.1 08/04/2011 1416   HCT 37.9 09/10/2009 0918   PLT 209 08/04/2011 1416   PLT 184 09/10/2009 0918   MCV 89.3 08/04/2011 1416   MCV 91.7 09/10/2009 0918   MCHC 34.6 09/10/2009 0918   RDW 13.5 09/10/2009 0918   LYMPHSABS 1.2 09/10/2009 0918   MONOABS 0.6 09/10/2009 0918   EOSABS 0.1 08/04/2011 1416   EOSABS 0.1 09/10/2009 0918   BASOSABS 0.0 08/04/2011 1416   BASOSABS 0.0 09/10/2009 0918        Studies/Results: Diagnostic mammography October 31 of this year  was unremarkable Assessment:  65 year old [place] woman status post right  lumpectomy and sentinel lymph node sampling October of 2009 for a T1 N0 grade 1 invasive ductal carcinoma strongly estrogen and progesterone receptor positive HER-2 negative with an MIB-1-1 of less than 13; treated adjuvantly with radiation then tamoxifen between February of 2010 and January of 2012 when we switched to letrozole.     Plan: The plan is to continue letrozole on till we complete 5 years of anti-estrogen therapy and that will be February of 2015. She will see me again in May (she sees Dr. Clelia Croft in February and Dr. Lloyd Huger in the fall) is that she start omeprazole prophylactically at 20 mg daily. Her yoga or swimming at to see if that makes a difference to her aches and pains. She will let us know if any new problems develop before the next visit.  MAGRINAT,GUSTAV C 08/11/2011

## 2011-08-11 NOTE — Telephone Encounter (Signed)
Gave patient appointment for 01-2012 

## 2011-08-14 ENCOUNTER — Other Ambulatory Visit: Payer: Self-pay | Admitting: Internal Medicine

## 2011-08-14 MED ORDER — METOPROLOL TARTRATE 25 MG PO TABS: 50.0000 mg | ORAL_TABLET | Freq: Two times a day (BID) | ORAL | Status: DC

## 2011-08-15 ENCOUNTER — Encounter: Payer: Self-pay | Admitting: *Deleted

## 2011-11-13 ENCOUNTER — Other Ambulatory Visit: Payer: Self-pay | Admitting: *Deleted

## 2011-11-13 DIAGNOSIS — C50919 Malignant neoplasm of unspecified site of unspecified female breast: Secondary | ICD-10-CM

## 2011-11-13 MED ORDER — LETROZOLE 2.5 MG PO TABS: 2.5000 mg | ORAL_TABLET | Freq: Every day | ORAL | Status: DC

## 2012-02-04 ENCOUNTER — Other Ambulatory Visit: Payer: BC Managed Care – PPO | Admitting: Lab

## 2012-02-11 ENCOUNTER — Ambulatory Visit (HOSPITAL_BASED_OUTPATIENT_CLINIC_OR_DEPARTMENT_OTHER): Payer: BC Managed Care – PPO | Admitting: Oncology

## 2012-02-11 ENCOUNTER — Telehealth: Payer: Self-pay | Admitting: *Deleted

## 2012-02-11 VITALS — BP 93/58 | HR 67 | Temp 98.3°F | Ht 68.0 in | Wt 141.7 lb

## 2012-02-11 DIAGNOSIS — C50919 Malignant neoplasm of unspecified site of unspecified female breast: Secondary | ICD-10-CM

## 2012-02-11 DIAGNOSIS — Z170 Estrogen receptor positive status [ER+]: Secondary | ICD-10-CM

## 2012-02-11 DIAGNOSIS — C50511 Malignant neoplasm of lower-outer quadrant of right female breast: Secondary | ICD-10-CM | POA: Insufficient documentation

## 2012-02-11 HISTORY — DX: Malignant neoplasm of lower-outer quadrant of right female breast: C50.511

## 2012-02-11 NOTE — Progress Notes (Signed)
ID: Kellie Humphrey   DOB: Oct 29, 1945  MR#: [id number]  WUJ#:[id number]  HISTORY OF PRESENT ILLNESS: She had a screening mammogram October 7th which showed a possible mass in the right breast.  Additional views October 13th showed a spiculated mass at the 4 o'clock position which mammographically measured 1.4 cm.  It was palpable by Dr. Deboraha Sprang at that time.  An ultrasound showed a spiculated hypoechoic mass measuring 1.2 cm by ultrasound.    Core biopsy was scheduled for the next day, October 14th, and showed ([id number] and [id number]) an invasive ductal carcinoma, appearing low grade, 100% estrogen and 100% progesterone receptor positive with a low MIB-1 at 11% and negative for the HER-2 receptor overexpression test at 1+.    Bilateral breast MRIs October 22nd.  There was no evidence of lymphadenopathy on either side in the right breast.  There was a 1.4 cm spiculated enhancing mass, but no other area of abnormality. With this information, the patient underwent needle localization, right lumpectomy and sentinel lymph node biopsy on July 25, 2008.  The final pathology ([id number]) showed a 1.3 cm invasive ductal carcinoma, grade 1, with ample margins, positive for lymphovascular invasion, but with 0 of 2 sentinel lymph nodes involved.   Her subsequent history is as detailed below  INTERVAL HISTORY: Kellie Humphrey returns today for followup of her breast cancer. The interval history is generally unremarkable. She enjoyed watching her grandchild graduate with [employer]. [employer], from preschool.  REVIEW OF SYSTEMS: She went off tamoxifen because of concerns regarding possible interactions with Cymbalta. She tried letrozole but it seemed to cause arthralgias and myalgias. She went off it for one month and the arthralgias and myalgias were no better, so she went back to it. However she feels that those aches and pains are getting worse all the time. If she sits down for 15 minutes starting to move his unpleasant. She just feels  "old". She is having some hot flashes and vaginal dryness issues, some of palpitations which are not accompanied by chest pain or shortness of breath or other symptoms, but it's really the arthralgias/myalgias problem that of is bothering her. A detailed review of systems was otherwise entirely negative  PAST MEDICAL HISTORY: Past Medical History  Diagnosis Date  . Breast cancer     s/p lumpectomy  . MVP (mitral valve prolapse)   . PAC (premature atrial contraction)   . Palpitations   She had a melanoma removed from the right side by Elmyra Ricks in 1982.  She had a hemangioma removed from the left elbow in 1987.  She is status post tonsillectomy. She has a history of mitral valve prolapse and has been evaluated for this most recently by Nicholes Mango, and she had an echocardiogram November 26, 2007 at [facility] which showed a normal left ventricle with an ejection fraction in the 55 to 65%, with mild mitral valve prolapse and regurgitation.  There was a small pericardial effusion.  Her understanding is that this is not enough of a concern that she would need antibiotic prophylaxis for dental or similar procedures.    PAST SURGICAL HISTORY: Past Surgical History  Procedure Date  . Breast lumpectomy     FAMILY HISTORY The patient's father is in his mid 40s. The patient's mother is also in her mid 20s.  She had a myocardial infarction remotely but is doing "okay" currently.  The patient has one sister in good health.  There is no history of breast or ovarian cancer in the family to  her knowledge.  [name]  GYNECOLOGIC HISTORY: She is GX P2, first pregnancy to term age 25.  She went through the change of life more than 10 years ago and had been on hormones until her breast cancer diagnosis.    SOCIAL HISTORY: She is a Manufacturing systems engineer.  Her husband Theodoro Grist is retired from [employer].  They have a daughter, Rubye Beach, who lives in [place] and has two children; a daughter, Jodelle Green, who lives in  [place] and has one child.  They are members of [employer].   ADVANCED DIRECTIVES: in place  HEALTH MAINTENANCE: History  Substance Use Topics  . Smoking status: Never Smoker   . Smokeless tobacco: Not on file  . Alcohol Use: Yes     Socially     Colonoscopy: 2008  PAP: UTD/Neal  Bone density: osteopenia  Lipid panel:  Allergies  Allergen Reactions  . Codeine     Current Outpatient Prescriptions  Medication Sig Dispense Refill  . aspirin 81 MG tablet Take 81 mg by mouth daily.        . calcium carbonate (OS-CAL) 600 MG TABS Take 600 mg by mouth daily.        . cholecalciferol (VITAMIN D) 1000 UNITS tablet Take 1,000 Units by mouth daily. Taking 3 Days a Week      . DULoxetine (CYMBALTA) 60 MG capsule Take 60 mg by mouth daily.        . fish oil-omega-3 fatty acids 1000 MG capsule Take 2 g by mouth daily.        [name] gabapentin (NEURONTIN) 300 MG capsule Take 300 mg by mouth daily.        [name] letrozole (FEMARA) 2.5 MG tablet Take 1 tablet (2.5 mg total) by mouth daily.  90 tablet  4  . metoprolol tartrate (LOPRESSOR) 25 MG tablet Take 2 tablets (50 mg total) by mouth 2 (two) times daily.  60 tablet  6  . multivitamin (THERAGRAN) per tablet Take 1 tablet by mouth daily.        . Biotin 5000 MCG CAPS Take 5,000 mcg by mouth daily.        . Nutritional Supplements (MELATONIN PO) Take by mouth daily.          OBJECTIVE: Middle-aged white woman in no acute distress Filed Vitals:   02/11/12 1151  BP: 93/58  Pulse: 67  Temp: 98.3 F (36.8 C)     Body mass index is 21.55 kg/(m^2).    ECOG FS: 0  Sclerae unicteric Oropharynx clear No peripheral adenopathy Lungs no rales or rhonchi Heart regular rate and rhythm Abd benign MSK no focal spinal tenderness, no peripheral edema Neuro: nonfocal Breasts: The right breast is status post lumpectomy. There is no evidence of local recurrence. The left breast is unremarkable  LAB RESULTS: Lab Results  Component Value Date    WBC 4.1 08/04/2011   NEUTROABS 2.4 08/04/2011   HGB 12.8 08/04/2011   HCT 38.1 08/04/2011   MCV 89.3 08/04/2011   PLT 209 08/04/2011      Chemistry      Component Value Date/Time   NA 140 08/04/2011 1416   NA 140 08/04/2011 1416   NA 140 08/04/2011 1416   K 4.0 08/04/2011 1416   K 4.0 08/04/2011 1416   K 4.0 08/04/2011 1416   CL 102 08/04/2011 1416   CL 102 08/04/2011 1416   CL 102 08/04/2011 1416   CO2 29 08/04/2011 1416   CO2 29 08/04/2011 1416  CO2 29 08/04/2011 1416   BUN 17 08/04/2011 1416   BUN 17 08/04/2011 1416   BUN 17 08/04/2011 1416   CREATININE 0.79 08/04/2011 1416   CREATININE 0.79 08/04/2011 1416   CREATININE 0.79 08/04/2011 1416      Component Value Date/Time   CALCIUM 9.6 08/04/2011 1416   CALCIUM 9.6 08/04/2011 1416   CALCIUM 9.6 08/04/2011 1416   ALKPHOS 68 08/04/2011 1416   ALKPHOS 68 08/04/2011 1416   ALKPHOS 68 08/04/2011 1416   AST 24 08/04/2011 1416   AST 24 08/04/2011 1416   AST 24 08/04/2011 1416   ALT 20 08/04/2011 1416   ALT 20 08/04/2011 1416   ALT 20 08/04/2011 1416   BILITOT 0.4 08/04/2011 1416   BILITOT 0.4 08/04/2011 1416   BILITOT 0.4 08/04/2011 1416       Lab Results  Component Value Date   LABCA2 23 08/04/2011   LABCA2 23 08/04/2011    No components found with this basename: [id number]    No results found for this basename: INR:1;PROTIME:1 in the last 168 hours  Urinalysis No results found for this basename: colorurine, appearanceur, labspec, phurine, glucoseu, hgbur, bilirubinur, ketonesur, proteinur, urobilinogen, nitrite, leukocytesur    STUDIES: Mammography October of 2012 was unremarkable  ASSESSMEN 66 year old [place] woman status post right lumpectomy and sentinel lymph node sampling October 2009 for a T1 N0, stage IA  invasive ductal carcinoma, grade 1, strongly estrogen and progesterone receptor positive, HER2 negative, with a proliferation fraction under 13.  She received adjuvant radiation and then started tamoxifen in February 2010.  When  she started Cymbalta, we switched her to letrozole beginning January 2012.   PLAN: We discussed the data that shows that tamoxifen metabolism, while in the affected by medications such as Cymbalta, result in no clinical loss of function. In short tamoxifen works equally well whether or not she takes drugs like Cymbalta. The second new bit of information is that she can obtain optimal risk reduction by taking tamoxifen for total of 10 years, so that a switch to an aromatase inhibitor is not mandatory.  Her choices boil down to taking letrozole an additional 2 years or taking tamoxifen an additional 7 years.  She is very much attracted to the idea of stopping after 2 years, so what we decided to do is stop the letrozole now and wait 3 months. She will call me late August. If she is feeling considerably better off the letrozole will go on tamoxifen and she will take an additional 7 years. If she is not really feeling much different, then she will go back on the letrozole and take it an additional 2 years  A side from all this she will see me again in one year. She knows to call for any problems that may develop before the next visit   Dezzie Badilla C    02/11/2012

## 2012-02-11 NOTE — Telephone Encounter (Signed)
gave patient appointment for 01-2013 printed out calendar and gave to the patient 

## 2012-02-20 ENCOUNTER — Other Ambulatory Visit (HOSPITAL_COMMUNITY): Payer: Self-pay | Admitting: *Deleted

## 2012-02-20 MED ORDER — METOPROLOL TARTRATE 25 MG PO TABS: 25.0000 mg | ORAL_TABLET | Freq: Two times a day (BID) | ORAL | Status: DC

## 2012-02-20 NOTE — Telephone Encounter (Signed)
Refilled metoprolol 

## 2012-05-11 ENCOUNTER — Telehealth: Payer: Self-pay | Admitting: *Deleted

## 2012-05-11 NOTE — Telephone Encounter (Signed)
Message left by pt stating she has been off the letrozole for 3 months with no significant benefit.  She would like to resume tamoxifen instead letrozole with the understanding she would be on it longer.  This RN called pt's home number and obtained identified answering machine. Message left for a return call per above with inquiry if tamoxifen prescription needed to be sent to pharmacy as well as to verify appropriate pharmacy.

## 2012-05-12 ENCOUNTER — Other Ambulatory Visit: Payer: Self-pay | Admitting: Emergency Medicine

## 2012-05-12 MED ORDER — TAMOXIFEN CITRATE 20 MG PO TABS: 20.0000 mg | ORAL_TABLET | Freq: Every day | ORAL | Status: AC

## 2012-06-21 ENCOUNTER — Other Ambulatory Visit: Payer: Self-pay | Admitting: Obstetrics & Gynecology

## 2012-06-21 DIAGNOSIS — Z853 Personal history of malignant neoplasm of breast: Secondary | ICD-10-CM

## 2012-06-21 DIAGNOSIS — Z9889 Other specified postprocedural states: Secondary | ICD-10-CM

## 2012-08-19 ENCOUNTER — Ambulatory Visit
Admission: RE | Admit: 2012-08-19 | Discharge: 2012-08-19 | Disposition: A | Discharge status: Home / Self Care | Payer: BC Managed Care – PPO | Source: Ambulatory Visit | Attending: Obstetrics & Gynecology | Admitting: Obstetrics & Gynecology

## 2012-08-19 DIAGNOSIS — Z853 Personal history of malignant neoplasm of breast: Secondary | ICD-10-CM

## 2012-08-19 DIAGNOSIS — Z9889 Other specified postprocedural states: Secondary | ICD-10-CM

## 2012-09-07 ENCOUNTER — Other Ambulatory Visit (HOSPITAL_COMMUNITY): Payer: Self-pay | Admitting: Internal Medicine

## 2012-10-26 ENCOUNTER — Other Ambulatory Visit (HOSPITAL_COMMUNITY): Payer: Self-pay | Admitting: Cardiology

## 2012-10-26 DIAGNOSIS — I059 Rheumatic mitral valve disease, unspecified: Secondary | ICD-10-CM

## 2012-10-26 MED ORDER — METOPROLOL TARTRATE 25 MG PO TABS: 25.0000 mg | ORAL_TABLET | Freq: Two times a day (BID) | ORAL | Status: DC

## 2012-10-26 NOTE — Telephone Encounter (Signed)
PT CALLED TO REQUEST A REFILL ON MEDS AS HER MOST RECENT REFILL STATES SHE NEEDS OFFICE VISIT FOR ADDITIONAL REFILLS. PT IS NOT DUE TO RETURN TO OFFICE UNTIL 07/2013. REFILLED MEDS UNTIL NEXT APPT DUE

## 2012-11-29 ENCOUNTER — Telehealth: Payer: Self-pay | Admitting: Internal Medicine

## 2012-11-29 NOTE — Telephone Encounter (Signed)
I have reviewed her chart and do not see an indication for an earlier colonoscopy (routine) based upon her situation. Specifically - there is not a proven increased risk of colon cancer in breast cancer patients except possibly in ones with a pecific genetic abnormality and unless wrong do not think she has that.  If she is having GI sxs we can discuss at an office visit.

## 2012-11-29 NOTE — Telephone Encounter (Signed)
Patient advised of Dr. Gessner's recommendations 

## 2012-11-29 NOTE — Telephone Encounter (Signed)
Patient had a normal colon in 06/2007 recall recommended for 2018.  She has been diagnosed with breast cancer since.  She wants to discuss if ok to schedule colon earlier.  Her oncologist has recommended this to her.  Please advise

## 2012-12-09 ENCOUNTER — Other Ambulatory Visit (HOSPITAL_COMMUNITY): Payer: Self-pay | Admitting: *Deleted

## 2012-12-09 DIAGNOSIS — I059 Rheumatic mitral valve disease, unspecified: Secondary | ICD-10-CM

## 2012-12-09 MED ORDER — METOPROLOL TARTRATE 25 MG PO TABS: 25.0000 mg | ORAL_TABLET | Freq: Two times a day (BID) | ORAL | Status: DC

## 2013-01-06 ENCOUNTER — Encounter: Payer: Self-pay | Admitting: Oncology

## 2013-02-01 ENCOUNTER — Other Ambulatory Visit (HOSPITAL_BASED_OUTPATIENT_CLINIC_OR_DEPARTMENT_OTHER): Payer: BC Managed Care – PPO | Admitting: Lab

## 2013-02-01 DIAGNOSIS — C50919 Malignant neoplasm of unspecified site of unspecified female breast: Secondary | ICD-10-CM

## 2013-02-01 DIAGNOSIS — C50319 Malignant neoplasm of lower-inner quadrant of unspecified female breast: Secondary | ICD-10-CM

## 2013-02-01 LAB — COMPREHENSIVE METABOLIC PANEL (CC13)
ALT: 15 U/L (ref 0–55)
Albumin: 3.4 g/dL — ABNORMAL LOW (ref 3.5–5.0)
CO2: 27 mEq/L (ref 22–29)
Calcium: 8.8 mg/dL (ref 8.4–10.4)
Chloride: 108 mEq/L — ABNORMAL HIGH (ref 98–107)
Creatinine: 0.8 mg/dL (ref 0.6–1.1)

## 2013-02-01 LAB — CBC WITH DIFFERENTIAL/PLATELET
BASO%: 0.6 % (ref 0.0–2.0)
Basophils Absolute: 0 10*3/uL (ref 0.0–0.1)
HCT: 37.6 % (ref 34.8–46.6)
HGB: 12.7 g/dL (ref 11.6–15.9)
MCHC: 33.9 g/dL (ref 31.5–36.0)
MONO#: 0.4 10*3/uL (ref 0.1–0.9)
NEUT%: 62.4 % (ref 38.4–76.8)
WBC: 3.2 10*3/uL — ABNORMAL LOW (ref 3.9–10.3)
lymph#: 0.8 10*3/uL — ABNORMAL LOW (ref 0.9–3.3)

## 2013-02-08 ENCOUNTER — Telehealth: Payer: Self-pay | Admitting: *Deleted

## 2013-02-08 ENCOUNTER — Ambulatory Visit (HOSPITAL_BASED_OUTPATIENT_CLINIC_OR_DEPARTMENT_OTHER): Payer: BC Managed Care – PPO | Admitting: Oncology

## 2013-02-08 VITALS — BP 102/66 | HR 67 | Temp 98.6°F | Resp 20 | Ht 68.0 in | Wt 136.1 lb

## 2013-02-08 DIAGNOSIS — C50919 Malignant neoplasm of unspecified site of unspecified female breast: Secondary | ICD-10-CM

## 2013-02-08 DIAGNOSIS — C50911 Malignant neoplasm of unspecified site of right female breast: Secondary | ICD-10-CM

## 2013-02-08 NOTE — Telephone Encounter (Signed)
appts made and printed...td 

## 2013-02-08 NOTE — Progress Notes (Signed)
ID: Kellie Humphrey   DOB: 1946/08/31  MR#: [id number]  AOZ#:[id number]  PCP: Pcp Not In System   HISTORY OF PRESENT ILLNESS: She had a screening mammogram October 7th which showed a possible mass in the right breast.  Additional views October 13th showed a spiculated mass at the 4 o'clock position which mammographically measured 1.4 cm.  It was palpable by Dr. Deboraha Humphrey at that time.  An ultrasound showed a spiculated hypoechoic mass measuring 1.2 cm by ultrasound.    Core biopsy was scheduled for the next day, October 14th, and showed ([id number] and [id number]) an invasive ductal carcinoma, appearing low grade, 100% estrogen and 100% progesterone receptor positive with a low MIB-1 at 11% and negative for the HER-2 receptor overexpression test at 1+.    Bilateral breast MRIs October 22nd.  There was no evidence of lymphadenopathy on either side in the right breast.  There was a 1.4 cm spiculated enhancing mass, but no other area of abnormality. With this information, the patient underwent needle localization, right lumpectomy and sentinel lymph node biopsy on July 25, 2008.  The final pathology ([id number]) showed a 1.3 cm invasive ductal carcinoma, grade 1, with ample margins, positive for lymphovascular invasion, but with 0 of 2 sentinel lymph nodes involved.   Her subsequent history is as detailed below  INTERVAL HISTORY: Kellie Humphrey returns today for followup of her breast cancer. The interval history is generally unremarkable. She exercises mostly by walking. The go to the mountains as often as they can and do a little bit of hiking there as well  REVIEW OF SYSTEMS: She went off letrozole May 2013, and after 3 months she really didn't feel a lot better. Despite that she prefer to go back on tamoxifen, which she resumed August of 2013. She is having minimal hot flashes, no problems with vaginal dryness or vaginal wetness. She has some heartburn, and she is taking Prilosec for that. She feels some joint  stiffness here in there, and has a history of palpitations, which were evaluated by Dr. Gala Humphrey. She was started on beta blockers. Otherwise a detailed review of systems today was entirely benign.  PAST MEDICAL HISTORY: Past Medical History  Diagnosis Date  . Breast cancer     s/p lumpectomy  . MVP (mitral valve prolapse)   . PAC (premature atrial contraction)   . Palpitations   She had a melanoma removed from the right side by Kellie Humphrey in 1982.  She had a hemangioma removed from the left elbow in 1987.  She is status post tonsillectomy. She has a history of mitral valve prolapse and has been evaluated for this most recently by Kellie Humphrey, and she had an echocardiogram November 26, 2007 at [facility] which showed a normal left ventricle with an ejection fraction in the 55 to 65%, with mild mitral valve prolapse and regurgitation.  There was a small pericardial effusion.  Her understanding is that this is not enough of a concern that she would need antibiotic prophylaxis for dental or similar procedures.    PAST SURGICAL HISTORY: Past Surgical History  Procedure Laterality Date  . Breast lumpectomy      FAMILY HISTORY The patient's father is in his mid 5s. The patient's mother is also in her mid 32s.  She had a myocardial infarction remotely but is doing "okay" currently.  The patient has one sister in good health.  There is no history of breast or ovarian cancer in the family to her knowledge.  [name]  GYNECOLOGIC HISTORY: She is GX P2, first pregnancy to term age 28.  She went through the change of life more than 10 years ago and had been on hormones until her breast cancer diagnosis.    SOCIAL HISTORY: She is a Manufacturing systems engineer.  Her husband Kellie Humphrey is retired from [employer].  They have a daughter, Kellie Humphrey, who lives in [place] and has two children; a daughter, Kellie Humphrey, who lives in [place] and has one child.  They are members of [employer].   ADVANCED DIRECTIVES: in  place  HEALTH MAINTENANCE: History  Substance Use Topics  . Smoking status: Never Smoker   . Smokeless tobacco: Not on file  . Alcohol Use: Yes     Comment: Socially     Colonoscopy: 2008  PAP: UTD/Kellie Humphrey  Bone density: osteopenia  Lipid panel:  Allergies  Allergen Reactions  . Codeine     Current Outpatient Prescriptions  Medication Sig Dispense Refill  . aspirin 81 MG tablet Take 81 mg by mouth daily.        . Biotin 5000 MCG CAPS Take 5,000 mcg by mouth daily.        . calcium carbonate (OS-CAL) 600 MG TABS Take 600 mg by mouth daily.        . cholecalciferol (VITAMIN D) 1000 UNITS tablet Take 1,000 Units by mouth daily. Taking 3 Days a Week      . DULoxetine (CYMBALTA) 60 MG capsule Take 60 mg by mouth daily.        . fish oil-omega-3 fatty acids 1000 MG capsule Take 2 g by mouth daily.        [name] gabapentin (NEURONTIN) 300 MG capsule Take 300 mg by mouth daily.        . metoprolol tartrate (LOPRESSOR) 25 MG tablet Take 1 tablet (25 mg total) by mouth 2 (two) times daily.  180 tablet  0  . multivitamin (THERAGRAN) per tablet Take 1 tablet by mouth daily.        . Nutritional Supplements (MELATONIN PO) Take by mouth daily.         No current facility-administered medications for this visit.    OBJECTIVE: Middle-aged white woman who looks well Filed Vitals:   02/08/13 0858  BP: 102/66  Pulse: 67  Temp: 98.6 F (37 C)  Resp: 20     Body mass index is 20.7 kg/(m^2).    ECOG FS: 0  Sclerae unicteric Oropharynx clear No peripheral adenopathy Lungs no rales or rhonchi Heart regular rate and rhythm Abd benign MSK no focal spinal tenderness, no peripheral edema Neuro: nonfocal Breasts: The right breast is status post lumpectomy and radiation. There is no evidence of local recurrence. The right axilla is benign. The left breast is unremarkable  LAB RESULTS: Lab Results  Component Value Date   WBC 3.2* 02/01/2013   NEUTROABS 2.0 02/01/2013   HGB 12.7 02/01/2013   HCT  37.6 02/01/2013   MCV 89.7 02/01/2013   PLT 183 02/01/2013      Chemistry      Component Value Date/Time   NA 142 02/01/2013 0953   NA 140 08/04/2011 1416   K 3.8 02/01/2013 0953   K 4.0 08/04/2011 1416   CL 108* 02/01/2013 0953   CL 102 08/04/2011 1416   CO2 27 02/01/2013 0953   CO2 29 08/04/2011 1416   BUN 11.2 02/01/2013 0953   BUN 17 08/04/2011 1416   CREATININE 0.8 02/01/2013 0953   CREATININE 0.79 08/04/2011 1416  Component Value Date/Time   CALCIUM 8.8 02/01/2013 0953   CALCIUM 9.6 08/04/2011 1416   ALKPHOS 49 02/01/2013 0953   ALKPHOS 68 08/04/2011 1416   AST 21 02/01/2013 0953   AST 24 08/04/2011 1416   ALT 15 02/01/2013 0953   ALT 20 08/04/2011 1416   BILITOT 0.49 02/01/2013 0953   BILITOT 0.4 08/04/2011 1416       Lab Results  Component Value Date   LABCA2 23 08/04/2011    No components found with this basename: [id number]    No results found for this basename: INR,  in the last 168 hours  Urinalysis No results found for this basename: colorurine,  appearanceur,  labspec,  phurine,  glucoseu,  hgbur,  bilirubinur,  ketonesur,  proteinur,  urobilinogen,  nitrite,  leukocytesur    STUDIES: Mammography 08/19/2012 was unremarkable  ASSESSMEN 67 y.o. [place] woman status post right lumpectomy and sentinel lymph node sampling October 2009 for a T1c N0, stage IA  invasive ductal carcinoma, grade 1, strongly estrogen and progesterone receptor positive, HER2 negative, with a proliferation fraction under 13.  She received adjuvant radiation and then started tamoxifen in February 2010.  When she started Cymbalta, we switched her to letrozole beginning January 2012. She then went off letrozole in May 2013, resuming tamoxifen August of 2013  PLAN: We spent most of today's 30 minute visit discussing her options regarding antiestrogen therapy. Basically a year from now she will have completed 5 years of anti-estrogens, including 1-1/4 year of aromatase inhibitors. If she has had 2 full years of  aromatase inhibitors, the risk reduction would be equivalent to her taking tamoxifen for 10 years the difference between one and a quarter year of letrozole in 2 years of letrozole is probably no more than 1 or 1-1/2% risk reduction.  In short I am comfortable stopping her anti-estrogens May of 2015. We will release surgery her primary care physician at that time. She knows to call for any problems that may develop before then.   MAGRINAT,GUSTAV C    02/08/2013

## 2013-02-10 ENCOUNTER — Ambulatory Visit: Payer: BC Managed Care – PPO | Admitting: Oncology

## 2013-02-10 ENCOUNTER — Other Ambulatory Visit: Payer: Self-pay | Admitting: *Deleted

## 2013-02-28 ENCOUNTER — Other Ambulatory Visit: Payer: Self-pay | Admitting: *Deleted

## 2013-02-28 DIAGNOSIS — C50919 Malignant neoplasm of unspecified site of unspecified female breast: Secondary | ICD-10-CM

## 2013-02-28 MED ORDER — TAMOXIFEN CITRATE 20 MG PO TABS: 20.0000 mg | ORAL_TABLET | Freq: Every day | ORAL | Status: DC

## 2013-02-28 NOTE — Progress Notes (Signed)
Tamoxifen refill to provider for review.

## 2013-03-11 ENCOUNTER — Other Ambulatory Visit: Payer: Self-pay | Admitting: *Deleted

## 2013-05-25 ENCOUNTER — Other Ambulatory Visit (HOSPITAL_COMMUNITY): Payer: Self-pay | Admitting: *Deleted

## 2013-05-25 DIAGNOSIS — I059 Rheumatic mitral valve disease, unspecified: Secondary | ICD-10-CM

## 2013-05-25 MED ORDER — METOPROLOL TARTRATE 25 MG PO TABS: 25.0000 mg | ORAL_TABLET | Freq: Two times a day (BID) | ORAL | Status: DC

## 2013-07-20 ENCOUNTER — Other Ambulatory Visit: Payer: Self-pay | Admitting: Obstetrics & Gynecology

## 2013-07-20 ENCOUNTER — Other Ambulatory Visit: Payer: Self-pay

## 2013-07-20 DIAGNOSIS — Z853 Personal history of malignant neoplasm of breast: Secondary | ICD-10-CM

## 2013-07-20 DIAGNOSIS — Z9889 Other specified postprocedural states: Secondary | ICD-10-CM

## 2013-08-22 ENCOUNTER — Ambulatory Visit
Admission: RE | Admit: 2013-08-22 | Discharge: 2013-08-22 | Disposition: A | Discharge status: Home / Self Care | Payer: Medicare Other | Source: Ambulatory Visit | Attending: Obstetrics & Gynecology | Admitting: Obstetrics & Gynecology

## 2013-08-22 DIAGNOSIS — Z9889 Other specified postprocedural states: Secondary | ICD-10-CM

## 2013-08-22 DIAGNOSIS — Z853 Personal history of malignant neoplasm of breast: Secondary | ICD-10-CM

## 2013-08-29 ENCOUNTER — Telehealth (HOSPITAL_COMMUNITY): Payer: Self-pay | Admitting: Cardiology

## 2013-08-29 DIAGNOSIS — C50919 Malignant neoplasm of unspecified site of unspecified female breast: Secondary | ICD-10-CM

## 2013-08-29 NOTE — Telephone Encounter (Signed)
Order placed for upcoming ECHO 

## 2013-08-31 ENCOUNTER — Ambulatory Visit (HOSPITAL_COMMUNITY)
Admission: RE | Admit: 2013-08-31 | Discharge: 2013-08-31 | Disposition: A | Discharge status: Home / Self Care | Payer: Medicare Other | Source: Ambulatory Visit | Attending: Internal Medicine | Admitting: Internal Medicine

## 2013-08-31 ENCOUNTER — Ambulatory Visit (HOSPITAL_BASED_OUTPATIENT_CLINIC_OR_DEPARTMENT_OTHER)
Admission: RE | Admit: 2013-08-31 | Discharge: 2013-08-31 | Disposition: A | Discharge status: Home / Self Care | Payer: Medicare Other | Source: Ambulatory Visit | Attending: Internal Medicine | Admitting: Internal Medicine

## 2013-08-31 ENCOUNTER — Encounter (HOSPITAL_COMMUNITY): Payer: Self-pay

## 2013-08-31 VITALS — BP 106/72 | HR 69 | Ht 67.5 in | Wt 138.8 lb

## 2013-08-31 DIAGNOSIS — I369 Nonrheumatic tricuspid valve disorder, unspecified: Secondary | ICD-10-CM

## 2013-08-31 DIAGNOSIS — C50919 Malignant neoplasm of unspecified site of unspecified female breast: Secondary | ICD-10-CM

## 2013-08-31 DIAGNOSIS — I491 Atrial premature depolarization: Secondary | ICD-10-CM | POA: Insufficient documentation

## 2013-08-31 DIAGNOSIS — I059 Rheumatic mitral valve disease, unspecified: Secondary | ICD-10-CM | POA: Insufficient documentation

## 2013-08-31 LAB — BASIC METABOLIC PANEL
BUN: 24 mg/dL — ABNORMAL HIGH (ref 6–23)
Chloride: 102 mEq/L (ref 96–112)
GFR calc Af Amer: 34 mL/min — ABNORMAL LOW (ref >90)
Glucose, Bld: 107 mg/dL — ABNORMAL HIGH (ref 70–99)
Potassium: 4 mEq/L (ref 3.5–5.1)
Sodium: 138 mEq/L (ref 135–145)

## 2013-08-31 MED ORDER — METOPROLOL TARTRATE 25 MG PO TABS: 25.0000 mg | ORAL_TABLET | Freq: Two times a day (BID) | ORAL | Status: DC

## 2013-08-31 NOTE — Progress Notes (Signed)
*  PRELIMINARY RESULTS* Echocardiogram 2D Echocardiogram has been performed.  Kellie Humphrey 08/31/2013, 10:55 AM

## 2013-08-31 NOTE — Progress Notes (Signed)
Patient ID: Kellie Humphrey, female   DOB: Dec 01, 1945, 67 y.o.   MRN: [id number] PCP: Dr. Clelia Croft  HPI: Kellie Humphrey is a very pleasant 67year-old woman with a history of mild mitral valve prolapse and associated PACs.  She had an exercise echo done for chest pain in 2009, this showed good exercise capacity with normal stress echo images.  She did have occasional PACs with brief 5-6 beat run of atrial tachycardia.  Follow up: Doing well. Brief palpitations every couple of days. No dizziness, SOB, or CP.  Walks a couple of days per week without problem.  No edema, syncope, presyncope.  Echo: EF 50-55% LVIDd 4.6 MV moderately thickened. Mild bileaflet MVP mild to moderate posterior MR.  No pulmonary HTN.    ROS: All systems negative except as listed in HPI, PMH and Problem List.  Past Medical History  Diagnosis Date  . Breast cancer     s/p lumpectomy  . MVP (mitral valve prolapse)   . PAC (premature atrial contraction)   . Palpitations     Current Outpatient Prescriptions  Medication Sig Dispense Refill  . amitriptyline (ELAVIL) 50 MG tablet Take 50 mg by mouth at bedtime as needed for sleep.      . Biotin 5000 MCG CAPS Take 5,000 mcg by mouth daily.        . calcium carbonate (OS-CAL) 600 MG TABS Take 600 mg by mouth daily.        . cholecalciferol (VITAMIN D) 1000 UNITS tablet Take 1,000 Units by mouth daily. Taking 3 Days a Week      . fish oil-omega-3 fatty acids 1000 MG capsule Take 2 g by mouth daily.        [name] gabapentin (NEURONTIN) 300 MG capsule Take 300 mg by mouth daily.        . metoprolol tartrate (LOPRESSOR) 25 MG tablet Take 1 tablet (25 mg total) by mouth 2 (two) times daily.  180 tablet  0  . multivitamin (THERAGRAN) per tablet Take 1 tablet by mouth daily.        . tamoxifen (NOLVADEX) 20 MG tablet Take 1 tablet (20 mg total) by mouth daily.  90 tablet  3   No current facility-administered medications for this encounter.   Filed Vitals:   08/31/13 1107  BP: 106/72   Pulse: 69  Height: 5' 7.5" (1.715 m)  Weight: 138 lb 12.8 oz (62.959 kg)  SpO2: 99%   PHYSICAL EXAM: General:  Well appearing. No resp difficulty HEENT: normal Neck: supple. JVP flat. Carotids 2+ bilaterally; no bruits. No lymphadenopathy or thryomegaly appreciated. Cor: PMI normal. Regular rate & rhythm. No rubs, gallops. + mid-systolic click and faint MR Lungs: clear Abdomen: soft, nontender, nondistended. No hepatosplenomegaly. No bruits or masses. Good bowel sounds. Extremities: no cyanosis, clubbing, rash, edema Neuro: alert & orientedx3, cranial nerves grossly intact. Moves all 4 extremities w/o difficulty. Affect pleasant.  ASSESSMENT & PLAN:  1) PACs - Still having a few PACs every couple of days. Not symptomatic. Will not increase BB at this time SBP 100s.  2) MVP with MR - ECHO reviewed today personally. MVP, MR and LV dimensions all very stable. Will continue to follow with yearly echos. If any symptoms call clinic.   Tahiry Spicer,MD 11:40 AM

## 2013-08-31 NOTE — Progress Notes (Signed)
*  PRELIMINARY RESULTS* Echocardiogram 2D Echocardiogram has been performed.  Kellie Humphrey 08/31/2013, 11:13 AM

## 2013-09-06 ENCOUNTER — Telehealth: Payer: Self-pay | Admitting: Oncology

## 2013-09-06 ENCOUNTER — Other Ambulatory Visit: Payer: Self-pay | Admitting: *Deleted

## 2013-09-06 DIAGNOSIS — Z853 Personal history of malignant neoplasm of breast: Secondary | ICD-10-CM

## 2013-09-06 DIAGNOSIS — R922 Inconclusive mammogram: Secondary | ICD-10-CM

## 2013-09-06 HISTORY — DX: Inconclusive mammogram: R92.2

## 2013-09-06 HISTORY — DX: Personal history of malignant neoplasm of breast: Z85.3

## 2013-09-06 NOTE — Progress Notes (Signed)
This RN spoke with pt per her inquiry by GYN stating need to obtain MRI of breast due to noted dense breast.  Per discussion and MD recommendation this RN placed order for MRI of breast.

## 2013-09-06 NOTE — Telephone Encounter (Signed)
pof sent 12/9 re order entered for mri-breast @ [facility] central rad scheduling will contact pt w/appt - no other orders

## 2013-10-05 ENCOUNTER — Other Ambulatory Visit (HOSPITAL_COMMUNITY): Payer: Self-pay | Admitting: Cardiology

## 2013-10-05 ENCOUNTER — Ambulatory Visit (INDEPENDENT_AMBULATORY_CARE_PROVIDER_SITE_OTHER): Payer: Medicare Other | Admitting: *Deleted

## 2013-10-05 ENCOUNTER — Ambulatory Visit (HOSPITAL_COMMUNITY): Admission: RE | Admit: 2013-10-05 | Payer: Medicare Other | Source: Ambulatory Visit

## 2013-10-05 DIAGNOSIS — I059 Rheumatic mitral valve disease, unspecified: Secondary | ICD-10-CM

## 2013-10-05 LAB — BASIC METABOLIC PANEL
BUN: 11 mg/dL (ref 6–23)
CALCIUM: 9.5 mg/dL (ref 8.4–10.5)
CO2: 31 meq/L (ref 19–32)
CREATININE: 0.8 mg/dL (ref 0.4–1.2)
Chloride: 104 mEq/L (ref 96–112)
GFR: 74.18 mL/min (ref >60.00)
Glucose, Bld: 78 mg/dL (ref 70–99)
Potassium: 3.4 mEq/L — ABNORMAL LOW (ref 3.5–5.1)
Sodium: 141 mEq/L (ref 135–145)

## 2013-10-11 ENCOUNTER — Other Ambulatory Visit: Payer: Self-pay | Admitting: *Deleted

## 2013-10-12 ENCOUNTER — Other Ambulatory Visit: Payer: Self-pay | Admitting: *Deleted

## 2013-10-14 ENCOUNTER — Telehealth (HOSPITAL_COMMUNITY): Payer: Self-pay | Admitting: Cardiology

## 2013-10-14 NOTE — Telephone Encounter (Signed)
Message copied by Mende Biswell, Sharlot Gowda on Fri Oct 14, 2013  5:50 PM ------      Message from: Jolaine Artist      Created: Fri Oct 14, 2013  4:16 PM       Potassium low. Please recheck ------

## 2013-10-18 NOTE — Telephone Encounter (Signed)
Pt aware of lab results via Kevan Rosebush, RN

## 2013-10-19 ENCOUNTER — Ambulatory Visit (HOSPITAL_COMMUNITY)
Admission: RE | Admit: 2013-10-19 | Discharge: 2013-10-19 | Disposition: A | Discharge status: Home / Self Care | Payer: Medicare Other | Source: Ambulatory Visit | Attending: Oncology | Admitting: Oncology

## 2013-10-19 DIAGNOSIS — K769 Liver disease, unspecified: Secondary | ICD-10-CM | POA: Insufficient documentation

## 2013-10-19 DIAGNOSIS — R922 Inconclusive mammogram: Secondary | ICD-10-CM

## 2013-10-19 DIAGNOSIS — Z09 Encounter for follow-up examination after completed treatment for conditions other than malignant neoplasm: Secondary | ICD-10-CM | POA: Insufficient documentation

## 2013-10-19 DIAGNOSIS — Z853 Personal history of malignant neoplasm of breast: Secondary | ICD-10-CM

## 2013-10-19 MED ORDER — GADOBENATE DIMEGLUMINE 529 MG/ML IV SOLN
12.0000 mL | Freq: Once | INTRAVENOUS | Status: AC | PRN
  Administered 2013-10-19: 12 mL via INTRAVENOUS

## 2013-10-27 ENCOUNTER — Telehealth: Payer: Self-pay | Admitting: *Deleted

## 2013-10-27 NOTE — Telephone Encounter (Signed)
Called pt to inform her of MRI results. No evidence of cancer. Told pt to F/U with appt in May to see Dr. Jana Hakim and yearly mammogram due in November 2015.

## 2013-12-07 ENCOUNTER — Encounter: Payer: Self-pay | Admitting: Neurology

## 2013-12-08 ENCOUNTER — Encounter: Payer: Self-pay | Admitting: Neurology

## 2013-12-08 ENCOUNTER — Encounter (INDEPENDENT_AMBULATORY_CARE_PROVIDER_SITE_OTHER): Payer: Self-pay

## 2013-12-08 ENCOUNTER — Ambulatory Visit (INDEPENDENT_AMBULATORY_CARE_PROVIDER_SITE_OTHER): Payer: Medicare Other | Admitting: Neurology

## 2013-12-08 VITALS — BP 103/71 | HR 78 | Ht 68.25 in | Wt 141.0 lb

## 2013-12-08 DIAGNOSIS — R251 Tremor, unspecified: Secondary | ICD-10-CM

## 2013-12-08 DIAGNOSIS — R259 Unspecified abnormal involuntary movements: Secondary | ICD-10-CM

## 2013-12-08 DIAGNOSIS — R209 Unspecified disturbances of skin sensation: Secondary | ICD-10-CM

## 2013-12-08 DIAGNOSIS — R202 Paresthesia of skin: Secondary | ICD-10-CM

## 2013-12-08 HISTORY — DX: Tremor, unspecified: R25.1

## 2013-12-08 MED ORDER — GABAPENTIN 300 MG PO CAPS: ORAL_CAPSULE | ORAL | Status: DC

## 2013-12-08 NOTE — Progress Notes (Signed)
[facility]    Provider:  Dr Janann Colonel Referring Provider: Marton Redwood, MD Primary Care Physician:  Marton Redwood, MD  CC:  tremor  HPI:  Kellie Humphrey is a 68 y.o. female here as a referral from Dr. Brigitte Pulse for tremor management   Has been evaluated and treated by Dr Erling Cruz in the past, currently taking Gabapentin 600mg  TID for suspected familial essential tremor. She initially noted tremors around 4 years ago. Describes as an action/postural tremor, no rest component. Has slowly progressed, feels it is bilateral. No difficulty with eating, does not make a mess. Has difficulty with writing and using a mouse. Describes difficulty with fine motor tasks. No LE or head tremor. Lately feels legs are a little unsteady but no trips or falls. Denies any slowing down of walking or movements. Tremor worse with stress, anxiety or cold weather. Does not drink caffeine. No noted change with EtOH intake. Currently taking Gabapentin 600mg  TID for tremor, feels it also helps RLS symptoms. Has not been on any other tremor medications in the past.   Patient also notes a generalized pins and needles sensation and specific areas where she has a burning sensation. Is currently taking Elavil 100mg  daily for this, has not noted any improvement.   Father has similar tremors.    Review of Systems: Out of a complete 14 system review, the patient complains of only the following symptoms, and all other reviewed systems are negative. + for skin sensitivity, palpitations, murmur, tremor  History   Social History  . Marital Status: Married    Spouse Name: [name]    Number of Children: 2  . Years of Education: Bachelor   Occupational History  . Not on file.   Social History Main Topics  . Smoking status: Never Smoker   . Smokeless tobacco: Never Used  . Alcohol Use: 0.0 oz/week     Comment: Socially  . Drug Use: No  . Sexual Activity: Not on file   Other Topics Concern  . Not on file    Social History Narrative   Patient is married to [name], has 2 children   Patient is right handed   Education level is Bachelor    Caffeine consumption is none          Family History  Problem Relation Age of Onset  . Coronary artery disease Mother   . Hypertension Mother   . Osteoporosis Mother     wrist fracture  . Osteoarthritis Father   . Neuropathy Father   . Tremor Father   . Other Father     lymphedema  . Fibromyalgia Sister   . Heart disease Maternal Grandmother     Past Medical History  Diagnosis Date  . Breast cancer     s/p lumpectomy  . MVP (mitral valve prolapse)   . PAC (premature atrial contraction)   . Palpitations     Past Surgical History  Procedure Laterality Date  . Breast lumpectomy    . Melanoma excision Right 1980    thigh  . Hemangioma excision Left 1990    arm  . Breast biopsy  2009  . Neck surgery  10/2009    C5-6 fusion    Current Outpatient Prescriptions  Medication Sig Dispense Refill  . amitriptyline (ELAVIL) 50 MG tablet Take 50 mg by mouth at bedtime as needed for sleep.      [name] aspirin 81 MG tablet Take 81 mg by mouth daily.      [name]  Biotin 5000 MCG CAPS Take 5,000 mcg by mouth daily.        [name] CALCIUM CITRATE PO Take 500 mg by mouth.      . cholecalciferol (VITAMIN D) 1000 UNITS tablet Take 1,000 Units by mouth daily. Taking 3 Days a Week      . fish oil-omega-3 fatty acids 1000 MG capsule Take 2 g by mouth daily.        [name] gabapentin (NEURONTIN) 600 MG tablet Take 600 mg by mouth 3 (three) times daily.      . metoprolol tartrate (LOPRESSOR) 25 MG tablet Take 1 tablet (25 mg total) by mouth 2 (two) times daily.  180 tablet  3  . multivitamin (THERAGRAN) per tablet Take 1 tablet by mouth daily.        . nitrofurantoin (MACRODANTIN) 50 MG capsule Take 50 mg by mouth 4 (four) times daily.      . tamoxifen (NOLVADEX) 20 MG tablet Take 1 tablet (20 mg total) by mouth daily.  90 tablet  3   No current facility-administered  medications for this visit.    Allergies as of 12/08/2013 - Review Complete 12/08/2013  Allergen Reaction Noted  . Codeine      Vitals: BP 103/71  Pulse 78  Ht 5' 8.25" (1.734 m)  Wt 141 lb (63.957 kg)  BMI 21.27 kg/m2 Last Weight:  Wt Readings from Last 1 Encounters:  12/08/13 141 lb (63.957 kg)   Last Height:   Ht Readings from Last 1 Encounters:  12/08/13 5' 8.25" (1.734 m)     Physical exam: Exam: Gen: NAD, conversant Eyes: anicteric sclerae, moist conjunctivae HENT: Atraumatic, oropharynx clear Neck: Trachea midline; supple,  Lungs: CTA, no wheezing, rales, rhonic                          CV: RRR, no MRG Abdomen: Soft, non-tender;  Extremities: No peripheral edema  Skin: Normal temperature, no rash,  Psych: Appropriate affect, pleasant  Neuro: MS: AA&Ox3, appropriately interactive, normal affect   Speech: fluent w/o paraphasic error  Memory: good recent and remote recall  CN: PERRL, EOMI no nystagmus, no ptosis, sensation intact to LT V1-V3 bilat, face symmetric, no weakness, hearing grossly intact, palate elevates symmetrically, shoulder shrug 5/5 bilat,  tongue protrudes midline, no fasiculations noted.  Motor: normal bulk and tone Strength: 5/5  In all extremities  Coord: no resting tremor noted, very mild postural and intention tremor L>R. No bradykinesia. Normal handwriting sample, no difficulty with Archimedes spiral  Reflexes: diminished but symmetrical, bilat downgoing toes  Sens: LT intact in all extremities. Decreased temp, PP and vibration bilateral LE to mid shin  Gait: posture, stance, stride and arm-swing normal. Tandem gait intact. Able to walk on heels and toes. Romberg absent.   Assessment:  After physical and neurologic examination, review of laboratory studies, imaging, neurophysiology testing and pre-existing records, assessment will be reviewed on the problem list.  Plan:  Treatment plan and additional workup will be reviewed  under Problem List.  1)Tremor: suspect benign essential tremor 2)Paresthesias  67y/o woman presenting for evaluation of tremor and paresthesias. Patient has seen Dr Erling Cruz in the past and given diagnosis of "familial tremor" and started on Gabapentin, currently taking 600mg  TID. Symptoms most consistent with a very mild essential tremor. Would hesitate to start patient on a new tremor medication at this time as symptoms appear mild. In the future if there is progression would consider addition of primidone  12.5mg  nightly. Counseled patient on this and she expressed understanding. Unclear etiology of paresthesias. Could be medication related (history of breast CA with treatment) Will check lab work (B12 and TSH normal per patient). If unremarkable would consider EMG/NCS. Will slowly titrate gabapentin up to 900mg  TID as tolerated. Will likely give benefit to both paresthesias and tremor symptoms. Follow up in 4 months.   Jim Like, DO  [facility] [address] [place], [place] [address]  Phone [phone] Fax [phone]

## 2013-12-08 NOTE — Patient Instructions (Signed)
Overall you are doing fairly well but I do want to suggest a few things today:   Remember to drink plenty of fluid, eat healthy meals and do not skip any meals. Try to eat protein with a every meal and eat a healthy snack such as fruit or nuts in between meals. Try to keep a regular sleep-wake schedule and try to exercise daily, particularly in the form of walking, 20-30 minutes a day, if you can.   As far as your medications are concerned, I would like to suggest the following: 1)Please increase the gabapentin to 900mg  three times a day. Start slowly by increasing just the evening dose to 900mg  (one 600mg  capsule and one 300mg  capsule) and then increase the other doses as tolerated.   As far as diagnostic testing:  1)Please have some blood work completed after checking out  I would like to see you back in 6 months, sooner if we need to. Please call us with any interim questions, concerns, problems, updates or refill requests.   My clinical assistant and will answer any of your questions and relay your messages to me and also relay most of my messages to you.   Our phone number is [phone]. We also have an after hours call service for urgent matters and there is a physician on-call for urgent questions. For any emergencies you know to call 911 or go to the nearest emergency room

## 2013-12-09 LAB — HEMOGLOBIN A1C
Est. average glucose Bld gHb Est-mCnc: 111 mg/dL
Hgb A1c MFr Bld: 5.5 % (ref 4.8–5.6)

## 2013-12-09 LAB — ANA W/REFLEX IF POSITIVE: Anti Nuclear Antibody(ANA): NEGATIVE

## 2013-12-12 NOTE — Progress Notes (Signed)
Quick Note:  Informed patient of normal lab work, patient expressed understanding no further questions or concerns. ______

## 2013-12-26 ENCOUNTER — Encounter: Payer: Self-pay | Admitting: Neurology

## 2013-12-26 ENCOUNTER — Other Ambulatory Visit: Payer: Self-pay | Admitting: Neurology

## 2013-12-26 MED ORDER — NITROFURANTOIN MACROCRYSTAL 50 MG PO CAPS: 50.0000 mg | ORAL_CAPSULE | Freq: Every day | ORAL | Status: DC

## 2014-01-02 ENCOUNTER — Telehealth: Payer: Self-pay | Admitting: Neurology

## 2014-01-02 ENCOUNTER — Other Ambulatory Visit: Payer: Self-pay | Admitting: Neurology

## 2014-01-02 MED ORDER — PREGABALIN 50 MG PO CAPS: 50.0000 mg | ORAL_CAPSULE | Freq: Two times a day (BID) | ORAL | Status: DC

## 2014-01-02 NOTE — Telephone Encounter (Signed)
Please let her know to discontinue the gabapentin and try Lyrica 50mg  twice a day. The prescription was sent to her pharmacy. Thanks.

## 2014-01-02 NOTE — Telephone Encounter (Signed)
Rx has been sent  

## 2014-01-02 NOTE — Telephone Encounter (Signed)
Patient states Lyrica was sent to [facility] @ corner of [address] but pharmacy says does not have RX-please send in again--thank you.

## 2014-01-02 NOTE — Telephone Encounter (Signed)
Called pt to inform her per Dr. Janann Colonel to discontinue Gabapentin and to try Lyrica 50 mg twice daily and the Rx has been sent to pt's pharmacy and if pt has any other problems, questions or concerns to call the office. Pt verbalized understanding.

## 2014-01-02 NOTE — Telephone Encounter (Signed)
Pt called states the medication gabapentin (NEURONTIN) 600 MG tablet did not help, pt states it hasn't done anything can you advise. Pt would like for Dr. Janann Colonel or his nurse to c/b concerning this matter. Thanks

## 2014-01-16 ENCOUNTER — Telehealth: Payer: Self-pay | Admitting: Neurology

## 2014-01-16 MED ORDER — PREGABALIN 100 MG PO CAPS: 100.0000 mg | ORAL_CAPSULE | Freq: Two times a day (BID) | ORAL | Status: DC

## 2014-01-16 NOTE — Telephone Encounter (Signed)
I called patient. The patient is on Lyrica taking 50 mg twice daily for paresthesias. The patient also has tremor. The past, the patient was on 600 mg 3 times daily of gabapentin without much benefit. The patient will be increased on the Lyrica taking 50 mg in the morning and 100 mg in the evening for 2 weeks, and then she will convert to 100 mg twice daily.

## 2014-01-16 NOTE — Telephone Encounter (Signed)
I called the patient back.  She said she has been taking Lyrica 50mg  twice daily, but does not seem to be getting relief.  She would ike to know if the dose could be increased.   Dr Janann Colonel is out of the office, forwarding to [facility] for review.  Please advise.  Thank you.

## 2014-01-16 NOTE — Telephone Encounter (Signed)
Pt called wants to know if the dosage of her medication pregabalin (LYRICA) 50 MG capsule can be increased. Please call pt concerning this matter. Thanks

## 2014-01-27 ENCOUNTER — Telehealth: Payer: Self-pay | Admitting: Oncology

## 2014-01-27 NOTE — Telephone Encounter (Signed)
per GM to move the appt. cld & left message with pt of time & date. Will mail copy of sch

## 2014-01-30 ENCOUNTER — Telehealth: Payer: Self-pay | Admitting: Hematology and Oncology

## 2014-01-30 NOTE — Telephone Encounter (Signed)
per pof to sch pt appts/ref to [facility] Dr Newman/will ask Kamini about the pof ref dx-gave copy of sch to pt °

## 2014-02-02 ENCOUNTER — Other Ambulatory Visit: Payer: BC Managed Care – PPO

## 2014-02-09 ENCOUNTER — Ambulatory Visit: Payer: BC Managed Care – PPO | Admitting: Oncology

## 2014-02-14 ENCOUNTER — Telehealth: Payer: Self-pay | Admitting: Neurology

## 2014-02-14 NOTE — Telephone Encounter (Signed)
Pt calling wanting to make Dr. Janann Colonel aware that pt stop taking Lyrica 100 mg because it has not helped and started taking Gabapentin. FYI

## 2014-02-14 NOTE — Telephone Encounter (Signed)
Patient calling to inform Dr Janann Colonel that the increase dosage of pregabalin (LYRICA) 100 MG capsule has not helped.  She has discontinued and started taking Gabapentin.

## 2014-02-15 ENCOUNTER — Other Ambulatory Visit: Payer: Self-pay | Admitting: *Deleted

## 2014-02-15 MED ORDER — TAMOXIFEN CITRATE 20 MG PO TABS: 20.0000 mg | ORAL_TABLET | Freq: Every day | ORAL | Status: DC

## 2014-02-15 NOTE — Telephone Encounter (Signed)
Approved refill of tamoxifen for #30 days. According to note of 01/2013, she will complete all antiestrogen therapy in May 2015. Next OV 03/01/14.

## 2014-02-21 ENCOUNTER — Telehealth: Payer: Self-pay | Admitting: Oncology

## 2014-02-21 NOTE — Telephone Encounter (Signed)
moved 5/28 appt to 9:45am due to Surv Day. lmonvm informing pt.

## 2014-02-21 NOTE — Telephone Encounter (Signed)
pt called to r/s lab to later time due to being out of town...done...pt aware of new time

## 2014-02-22 ENCOUNTER — Other Ambulatory Visit: Payer: Self-pay | Admitting: *Deleted

## 2014-02-22 ENCOUNTER — Other Ambulatory Visit: Payer: Medicare Other

## 2014-02-22 DIAGNOSIS — C50919 Malignant neoplasm of unspecified site of unspecified female breast: Secondary | ICD-10-CM

## 2014-02-23 ENCOUNTER — Other Ambulatory Visit: Payer: Medicare Other

## 2014-02-23 ENCOUNTER — Other Ambulatory Visit (HOSPITAL_BASED_OUTPATIENT_CLINIC_OR_DEPARTMENT_OTHER): Payer: Medicare Other

## 2014-02-23 DIAGNOSIS — C50919 Malignant neoplasm of unspecified site of unspecified female breast: Secondary | ICD-10-CM

## 2014-02-23 LAB — CBC WITH DIFFERENTIAL/PLATELET
BASO%: 0.3 % (ref 0.0–2.0)
Basophils Absolute: 0 10*3/uL (ref 0.0–0.1)
EOS%: 4.6 % (ref 0.0–7.0)
Eosinophils Absolute: 0.2 10*3/uL (ref 0.0–0.5)
HCT: 35.6 % (ref 34.8–46.6)
HGB: 11.7 g/dL (ref 11.6–15.9)
LYMPH%: 29 % (ref 14.0–49.7)
MCH: 29.4 pg (ref 25.1–34.0)
MCHC: 32.9 g/dL (ref 31.5–36.0)
MCV: 89.4 fL (ref 79.5–101.0)
MONO#: 0.4 10*3/uL (ref 0.1–0.9)
MONO%: 9.4 % (ref 0.0–14.0)
NEUT%: 56.7 % (ref 38.4–76.8)
NEUTROS ABS: 2.1 10*3/uL (ref 1.5–6.5)
Platelets: 177 10*3/uL (ref 145–400)
RBC: 3.98 10*6/uL (ref 3.70–5.45)
RDW: 13.8 % (ref 11.2–14.5)
WBC: 3.7 10*3/uL — ABNORMAL LOW (ref 3.9–10.3)
lymph#: 1.1 10*3/uL (ref 0.9–3.3)

## 2014-02-23 LAB — COMPREHENSIVE METABOLIC PANEL (CC13)
ALT: 12 U/L (ref 0–55)
AST: 19 U/L (ref 5–34)
Albumin: 3.5 g/dL (ref 3.5–5.0)
Alkaline Phosphatase: 47 U/L (ref 40–150)
Anion Gap: 8 mEq/L (ref 3–11)
BILIRUBIN TOTAL: 0.29 mg/dL (ref 0.20–1.20)
BUN: 12.8 mg/dL (ref 7.0–26.0)
CO2: 25 mEq/L (ref 22–29)
Calcium: 8.9 mg/dL (ref 8.4–10.4)
Chloride: 108 mEq/L (ref 98–109)
Creatinine: 0.8 mg/dL (ref 0.6–1.1)
Glucose: 88 mg/dl (ref 70–140)
Potassium: 4 mEq/L (ref 3.5–5.1)
SODIUM: 141 meq/L (ref 136–145)
TOTAL PROTEIN: 6.5 g/dL (ref 6.4–8.3)

## 2014-03-01 ENCOUNTER — Ambulatory Visit (HOSPITAL_BASED_OUTPATIENT_CLINIC_OR_DEPARTMENT_OTHER): Payer: Medicare Other | Admitting: Internal Medicine

## 2014-03-01 VITALS — BP 104/71 | HR 82 | Temp 97.7°F | Resp 18 | Ht 68.25 in | Wt 138.0 lb

## 2014-03-01 DIAGNOSIS — C50319 Malignant neoplasm of lower-inner quadrant of unspecified female breast: Secondary | ICD-10-CM

## 2014-03-01 DIAGNOSIS — R922 Inconclusive mammogram: Secondary | ICD-10-CM

## 2014-03-01 DIAGNOSIS — C50919 Malignant neoplasm of unspecified site of unspecified female breast: Secondary | ICD-10-CM

## 2014-03-01 NOTE — Progress Notes (Signed)
ID: Kellie Humphrey   DOB: [birth year]  MR#: [id number]  HLK#:[id number]  PCP: Marton Redwood, MD   HISTORY OF PRESENT ILLNESS: 1) Early stage right breast cancer T1, N0, M0, status post lumpectomy and sentinel lymph node resection on October 27 of 2009, grade 1, ER positive, PR positive, HER-2/neu negative, status post adjuvant radiation treatment on anti-estrogens since  Breast cancer history: She had a screening mammogram October 7th which showed a possible mass in the right breast.  Additional views October 13th showed a spiculated mass at the 4 o'clock position which mammographically measured 1.4 cm.  It was palpable by Dr. Sadie Haber at that time.  An ultrasound showed a spiculated hypoechoic mass measuring 1.2 cm by ultrasound.    Core biopsy was scheduled for the next day, October 14th, and showed ([id number] and [id number]) an invasive ductal carcinoma, appearing low grade, 100% estrogen and 100% progesterone receptor positive with a low MIB-1 at 11% and negative for the HER-2 receptor overexpression test at 1+.    Bilateral breast MRIs October 22nd.  There was no evidence of lymphadenopathy on either side in the right breast.  There was a 1.4 cm spiculated enhancing mass, but no other area of abnormality. With this information, the patient underwent needle localization, right lumpectomy and sentinel lymph node biopsy on July 25, 2008.  The final pathology ([id number]) showed a 1.3 cm invasive ductal carcinoma, grade 1, with ample margins, positive for lymphovascular invasion, but with 0 of 2 sentinel lymph nodes involved.   Her subsequent history is as detailed below  INTERVAL HISTORY: She continues to remain on tamoxifen with good tolerance. Hot flashes are minimal to none. She is eating well. Denies any problems with bowel movement or urine function.  She had MRI of both breasts performed in January of this year for dense breast. She did not have any BRCA testing done. She does not have any  family history of breast ovarian cancers. She is due for diagnostic mammogram in November of this year.   ROS:  Negative for respiratory, negative for skin, negative for neurology, negative for cardiovascular, negative for gastroenterology, negative for endocrine, negative for ID, negative for genitourinary, negative for mental health, negative for constitutional symptoms, negative for musculoskeletal, negative for HEENT   PAST MEDICAL HISTORY: Past Medical History  Diagnosis Date  . Breast cancer     s/p lumpectomy  . MVP (mitral valve prolapse)   . PAC (premature atrial contraction)   . Palpitations   She had a melanoma removed from the right side by [facility] in 1982.  She had a hemangioma removed from the left elbow in 1987.  She is status post tonsillectomy. She has a history of mitral valve prolapse and has been evaluated for this most recently by Pierre Bali, and she had an echocardiogram November 26, 2007 at [facility] which showed a normal left ventricle with an ejection fraction in the 55 to 65%, with mild mitral valve prolapse and regurgitation.  There was a small pericardial effusion.  Her understanding is that this is not enough of a concern that she would need antibiotic prophylaxis for dental or similar procedures.    PAST SURGICAL HISTORY: Past Surgical History  Procedure Laterality Date  . Breast lumpectomy    . Melanoma excision Right 1980    thigh  . Hemangioma excision Left 1990    arm  . Breast biopsy  2009  . Neck surgery  10/2009    C5-6 fusion    FAMILY  HISTORY The patient's father is in his mid 75s. The patient's mother is also in her mid 23s.  She had a myocardial infarction remotely but is doing "okay" currently.  The patient has one sister in good health.  There is no history of breast or ovarian cancer in the family to her knowledge.  [name]  GYNECOLOGIC HISTORY: She is GX P2, first pregnancy to term age 78.  She went through the change of life more than  10 years ago and had been on hormones until her breast cancer diagnosis.    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  They have a daughter, Alexis Goodell, who lives in [place] and has two children; a daughter, Kerri Perches, who lives in [place] and has one child.  They are members of [employer].   ADVANCED DIRECTIVES: in place  HEALTH MAINTENANCE: History  Substance Use Topics  . Smoking status: Never Smoker   . Smokeless tobacco: Never Used  . Alcohol Use: 0.0 oz/week     Comment: Socially     Colonoscopy: 2008  PAP: UTD/Neal  Bone density: osteopenia  Lipid panel:  Allergies  Allergen Reactions  . Codeine Nausea Only    Current Outpatient Prescriptions  Medication Sig Dispense Refill  . amitriptyline (ELAVIL) 50 MG tablet Take 50 mg by mouth at bedtime as needed for sleep.      [name] aspirin 81 MG tablet Take 81 mg by mouth daily.      . Biotin 5000 MCG CAPS Take 5,000 mcg by mouth daily.        [name] CALCIUM CITRATE PO Take 500 mg by mouth.      . cholecalciferol (VITAMIN D) 1000 UNITS tablet Take 1,000 Units by mouth daily. Taking 3 Days a Week      . fish oil-omega-3 fatty acids 1000 MG capsule Take 2 g by mouth daily.        [name] gabapentin (NEURONTIN) 300 MG capsule Take 600 mg by mouth 3 (three) times daily.      . metoprolol tartrate (LOPRESSOR) 25 MG tablet Take 1 tablet (25 mg total) by mouth 2 (two) times daily.  180 tablet  3  . multivitamin (THERAGRAN) per tablet Take 1 tablet by mouth daily.        . nitrofurantoin (MACRODANTIN) 50 MG capsule Take 1 capsule (50 mg total) by mouth daily.  30 capsule  6  . tamoxifen (NOLVADEX) 20 MG tablet Take 1 tablet (20 mg total) by mouth daily.  30 tablet  0   No current facility-administered medications for this visit.   PHYSICAL EXAM: OBJECTIVE: Middle-aged white woman who looks well Filed Vitals:   03/01/14 1504  BP: 104/71  Pulse: 82  Temp: 97.7 F (36.5 C)  Resp: 18      Body mass index is 20.82 kg/(m^2).    ECOG FS: 0 General: well-nourished in no acute distress. Alert and oriented X 3 HEENT: PERRLA. Anicteric sclerae.  Mucous membranes moist Lymph nodes: no adenopathy appreciated within cervical, supraclavicular, infraclavicular or axillary regions.  Lungs: Clear to ausculatation throughout. No rales or wheezing. Cardiovascular: regular, rate, rhythm. S1, S2. No jugular vein distension. Positive peripheral pulses. Breast:  no palpable suspicious masses in both breasts. She has fibroglandular nodular breasts. Abdomen:  Positive bowel sounds. No hepatospenomegaly. Non-tender. Non-distended Groin: No adenopathy Extremities: No swelling bilaterally Cranial nerves: no gross focal deficits Skin: unremarkable   LAB RESULTS: Lab Results  Component Value  Date   WBC 3.7* 02/23/2014   NEUTROABS 2.1 02/23/2014   HGB 11.7 02/23/2014   HCT 35.6 02/23/2014   MCV 89.4 02/23/2014   PLT 177 02/23/2014      Chemistry      Component Value Date/Time   NA 141 02/23/2014 1208   NA 141 10/05/2013 1639   K 4.0 02/23/2014 1208   K 3.4* 10/05/2013 1639   CL 104 10/05/2013 1639   CL 108* 02/01/2013 0953   CO2 25 02/23/2014 1208   CO2 31 10/05/2013 1639   BUN 12.8 02/23/2014 1208   BUN 11 10/05/2013 1639   CREATININE 0.8 02/23/2014 1208   CREATININE 0.8 10/05/2013 1639      Component Value Date/Time   CALCIUM 8.9 02/23/2014 1208   CALCIUM 9.5 10/05/2013 1639   ALKPHOS 47 02/23/2014 1208   ALKPHOS 68 08/04/2011 1416   AST 19 02/23/2014 1208   AST 24 08/04/2011 1416   ALT 12 02/23/2014 1208   ALT 20 08/04/2011 1416   BILITOT 0.29 02/23/2014 1208   BILITOT 0.4 08/04/2011 1416      STUDIES: Mammography 07/2013 was unremarkable  IMPRESSION/REPORT/PLAN:  68 y.o. [place] woman  1) T1c N0, stage IA  invasive ductal carcinoma, grade 1, strongly estrogen and progesterone receptor positive, HER2 negative, with a proliferation fraction under 13, status post right lumpectomy and sentinel lymph  node sampling October 2009 status post adjuvant radiation and then started tamoxifen in February 2010.  When she started Cymbalta, we switched her to letrozole beginning January 2012. She then went off letrozole in May 2013, resuming tamoxifen August of 2013  She has not clinical evidence for recurrence. MRI breasts 09/2013 for dense breasts negative.  She has had approximately 1-1/4 years of aromatase inhibitor adjuvant treatment and the rest has been on tamoxifen. She has been on anti-estrogens since February of 2010. I reviewed her pathology which was low grade, but showed lymphovascular invasion.  We discussed her continuing on tamoxifen which she has been tolerating very well. For now, she plans to continue tamoxifen.  Plan for mammogram 07/2014 and follow-up in a year.  Multiple questions answered  Total time 15 minutes face-to-face where more than 50% spent on counseling.  Doristine Church    03/01/2014

## 2014-03-06 ENCOUNTER — Telehealth: Payer: Self-pay | Admitting: Internal Medicine

## 2014-03-06 NOTE — Telephone Encounter (Signed)
, °

## 2014-03-07 ENCOUNTER — Other Ambulatory Visit: Payer: Self-pay | Admitting: *Deleted

## 2014-03-07 DIAGNOSIS — C50919 Malignant neoplasm of unspecified site of unspecified female breast: Secondary | ICD-10-CM

## 2014-03-07 MED ORDER — TAMOXIFEN CITRATE 20 MG PO TABS: 20.0000 mg | ORAL_TABLET | Freq: Every day | ORAL | Status: DC

## 2014-03-08 ENCOUNTER — Telehealth: Payer: Self-pay | Admitting: *Deleted

## 2014-03-08 NOTE — Telephone Encounter (Signed)
This RN received call from pt inquiring " why was my tamoxifen only refilled for 30 tabs with my last mail order ?"  This RN researched refills and noted refill in May was given for 30 days vs usual 90 days.  New refill has been sent x 2 to correct for 90 day supply.  Note Kellie Humphrey wanted to know " why and how this happened because I had to pay for the 30 days and I would have had to pay if it was for 90 days."  This RN informed medications are refilled by automated system via computer and RN when refilled put in 30 vs 90 . Noted new refills given yesterday with dispense amount of 90. 30 day fill was a typo.  Kellie Humphrey stated " I have been on the phone for 2 hours trying to get this worked out " " it only cost me $3 but what if it was a $100.   This RN apologized for above occurrence again informing her new script is for 90 days supply.  No other needs at this time.

## 2014-05-12 ENCOUNTER — Encounter: Payer: Self-pay | Admitting: Internal Medicine

## 2014-05-23 ENCOUNTER — Other Ambulatory Visit (HOSPITAL_COMMUNITY): Payer: Self-pay | Admitting: Anesthesiology

## 2014-05-23 DIAGNOSIS — I341 Nonrheumatic mitral (valve) prolapse: Secondary | ICD-10-CM

## 2014-06-13 ENCOUNTER — Ambulatory Visit: Payer: Medicare Other | Admitting: Neurology

## 2014-07-24 ENCOUNTER — Telehealth: Payer: Self-pay

## 2014-07-24 ENCOUNTER — Ambulatory Visit (INDEPENDENT_AMBULATORY_CARE_PROVIDER_SITE_OTHER): Payer: Medicare Other | Admitting: Neurology

## 2014-07-24 ENCOUNTER — Encounter: Payer: Self-pay | Admitting: Neurology

## 2014-07-24 VITALS — BP 105/71 | HR 80 | Temp 98.3°F | Resp 18 | Ht 67.0 in | Wt 140.0 lb

## 2014-07-24 DIAGNOSIS — G609 Hereditary and idiopathic neuropathy, unspecified: Secondary | ICD-10-CM

## 2014-07-24 DIAGNOSIS — R202 Paresthesia of skin: Secondary | ICD-10-CM

## 2014-07-24 NOTE — Patient Instructions (Signed)
Overall you are doing fairly well but I do want to suggest a few things today:   Remember to drink plenty of fluid, eat healthy meals and do not skip any meals. Try to eat protein with a every meal and eat a healthy snack such as fruit or nuts in between meals. Try to keep a regular sleep-wake schedule and try to exercise daily, particularly in the form of walking, 20-30 minutes a day, if you can.   As far as your medications are concerned, I would like to suggest: stop gabapentin and start Gralise.   EMG/NCS please schedule  I would like to see you back in 3 months, sooner if we need to. Please call us with any interim questions, concerns, problems, updates or refill requests.   Please also call us for any test results so we can go over those with you on the phone.  My clinical assistant and will answer any of your questions and relay your messages to me and also relay most of my messages to you.   Our phone number is [phone]. We also have an after hours call service for urgent matters and there is a physician on-call for urgent questions. For any emergencies you know to call 911 or go to the nearest emergency room

## 2014-07-24 NOTE — Progress Notes (Signed)
[facility]    Provider:  Dr Jaynee Eagles Referring Provider: Marton Redwood, MD Primary Care Physician:  Marton Redwood, MD  CC:  Tremors, paresthesias, RLS  HPI:  Kellie Humphrey is a 68 y.o. female here as a follow up for tremors and paresthesias and RLS  Still taking gabapoentin 659m bid for RLS and tremors. Helps with the RLS and tremors but not with the paresthesias. Paresthesias described as sensitive skin and prickles all over her body: Her hands and feet and her thorax, sometimes even her hair. Usually general  and symmetric but describes some focal focal paresthesias consistently in the left ulnar aspect of the left arm. The other sensations in the toes and fingers feel like they fell asleep.  No side effects from the gabapentin. She tried going up to 3x a day dosing and it didn't help at all with the strange sensations. She has been taking it for years. As far as the more focal sensations in her left hand and forearm, she denies weakness, not dropping objects - Right inbetween digit 4 and 5. Numbness in hand started 6 months ago. The hematoma 20 years ago on the ulnar aspect of the wrist removed however the sensations in the left ulnar hand only started 6 months ago. No other symptoms. Father has tremors.   Previous records: Patient has seen Dr LErling Cruzand Dr. SJanann Colonelin the past and given diagnosis of "familial tremor" and started on Gabapentin, currently taking 6060mTID  Review of Systems: Patient complains of symptoms per HPI as well as the following symptoms leg swelling, murmur. Pertinent negatives per HPI. All others negative.   History   Social History  . Marital Status: Married    Spouse Name: DaShanon Brow  Number of Children: 2  . Years of Education: Bachelor   Occupational History  . Not on file.   Social History Main Topics  . Smoking status: Never Smoker   . Smokeless tobacco: Never Used  . Alcohol Use: 0.0 oz/week     Comment: Socially  . Drug Use:  No  . Sexual Activity: Not on file   Other Topics Concern  . Not on file   Social History Narrative   Patient is married to DaShanon Browhas 2 children   Patient is right handed   Education level is Bachelor    Caffeine consumption is none          Family History  Problem Relation Age of Onset  . Coronary artery disease Mother   . Hypertension Mother   . Osteoporosis Mother     wrist fracture  . Osteoarthritis Father   . Neuropathy Father   . Tremor Father   . Other Father     lymphedema  . Fibromyalgia Sister   . Heart disease Maternal Grandmother     Past Medical History  Diagnosis Date  . Breast cancer     s/p lumpectomy  . MVP (mitral valve prolapse)   . PAC (premature atrial contraction)   . Palpitations     Past Surgical History  Procedure Laterality Date  . Breast lumpectomy    . Melanoma excision Right 1980    thigh  . Hemangioma excision Left 1990    arm  . Breast biopsy  2009  . Neck surgery  10/2009    C5-6 fusion    Current Outpatient Prescriptions  Medication Sig Dispense Refill  . amitriptyline (ELAVIL) 50 MG tablet Take 50 mg by mouth at bedtime  as needed for sleep.      [name] aspirin 81 MG tablet Take 81 mg by mouth daily.      . Biotin 5000 MCG CAPS Take 5,000 mcg by mouth daily.        [name] CALCIUM CITRATE PO Take 500 mg by mouth.      . cholecalciferol (VITAMIN D) 1000 UNITS tablet Take 1,000 Units by mouth daily. Taking 3 Days a Week      . fish oil-omega-3 fatty acids 1000 MG capsule Take 2 g by mouth daily.        [name] gabapentin (NEURONTIN) 300 MG capsule Take 600 mg by mouth 3 (three) times daily.      . metoprolol tartrate (LOPRESSOR) 25 MG tablet Take 1 tablet (25 mg total) by mouth 2 (two) times daily.  180 tablet  3  . multivitamin (THERAGRAN) per tablet Take 1 tablet by mouth daily.        . nitrofurantoin (MACRODANTIN) 50 MG capsule Take 1 capsule (50 mg total) by mouth daily.  30 capsule  6  . tamoxifen (NOLVADEX) 20 MG tablet Take 1  tablet (20 mg total) by mouth daily.  90 tablet  3   No current facility-administered medications for this visit.    Allergies as of 07/24/2014 - Review Complete 07/24/2014  Allergen Reaction Noted  . Codeine Nausea Only 03/01/2014    Vitals: BP 105/71  Pulse 80  Temp(Src) 98.3 F (36.8 C) (Oral)  Resp 18  Ht '5\' 7"'  (1.702 m)  Wt 140 lb (63.504 kg)  BMI 21.92 kg/m2 Last Weight:  Wt Readings from Last 1 Encounters:  07/24/14 140 lb (63.504 kg)   Last Height:   Ht Readings from Last 1 Encounters:  07/24/14 '5\' 7"'  (1.702 m)   Physical exam: Exam: Gen: NAD, conversant, well nourised, obese, well groomed                     CV: RRR, no MRG. No Carotid Bruits. No peripheral edema, warm, nontender Eyes: Conjunctivae clear without exudates or hemorrhage  Neuro: Detailed Neurologic Exam  Speech:    Speech is normal; fluent and spontaneous with normal comprehension.  Cognition:    The patient is oriented to person, place, and time;     recent and remote memory intact;     language fluent;     normal attention, concentration,     fund of knowledge Cranial Nerves:    The pupils are equal, round, and reactive to light. The fundi are normal and spontaneous venous pulsations are present. Visual fields are full to finger confrontation. Extraocular movements are intact. Trigeminal sensation is intact and the muscles of mastication are normal. The face is symmetric. The palate elevates in the midline. Voice is normal. Shoulder shrug is normal. The tongue has normal motion without fasciculations.   Coordination:    Normal finger to nose and heel to shin. Normal rapid alternating movements. No resting tremor noted, very mild postural and intention tremor L>R. No bradykinesia. Normal handwriting sample, no difficulty with Archimedes spiral    Gait:    Heel-toe and tandem gait are normal.   Motor Observation:    No asymmetry, no atrophy, and no involuntary movements noted. Tone:     Normal muscle tone.    Posture:    Posture is normal. normal erect    Strength:    Strength is V/V in the upper and lower limbs.      Sensation:  LT intact in all extremities. Decreased temp, PP and vibration bilateral LE to mid shin  Reflex Exam:  DTR's:    Deep tendon reflexes in the upper and lower extremities are normal bilaterally.   Toes:    The toes are downgoing bilaterally.   Clonus:    Clonus is absent.       Assessment/Plan:   67y/o woman presenting for evaluation of tremor and paresthesias. Symptoms most consistent with a very mild essential tremor.   - Tremor: suspect benign essential tremor: Gralise. Can try primidone in the future if it bothers patient or progresses.  - Paresthesias : Try Gralise. Will evaluate further with EMG/NCS. Will order labs including TSH, HIV, ESR, B12, IFE, Heavy metals, RPR, lyme - RLS: Gralise. Has ferritin been checked? - Ulnar neuropathy: Will follow up with EMG/NCS.   Sarina Ill, MD  [facility] [address] [place], [place] [address]  Phone [phone] Fax [phone]

## 2014-07-24 NOTE — Telephone Encounter (Signed)
I called ins at [phone].  Spoke with [employer].  She said Gralise is not covered under the patient's plan, and would require a formulary exception prior auth request.  She then transferred me to [place].  He said they would not be able to tell me what the co-pay cost would be, but said IF ins approved the exception request, patient would pay tier 4 co-pay.  I called the patient.  She said she will try the samples and call us back to let us know how she is doing before deciding wether or not she wants to pay this tier amount for med.

## 2014-07-25 ENCOUNTER — Other Ambulatory Visit (INDEPENDENT_AMBULATORY_CARE_PROVIDER_SITE_OTHER): Payer: Self-pay

## 2014-07-25 ENCOUNTER — Encounter (INDEPENDENT_AMBULATORY_CARE_PROVIDER_SITE_OTHER): Payer: Self-pay | Admitting: Radiology

## 2014-07-25 ENCOUNTER — Ambulatory Visit (INDEPENDENT_AMBULATORY_CARE_PROVIDER_SITE_OTHER): Payer: Medicare Other | Admitting: Neurology

## 2014-07-25 DIAGNOSIS — Z0289 Encounter for other administrative examinations: Secondary | ICD-10-CM

## 2014-07-25 DIAGNOSIS — G609 Hereditary and idiopathic neuropathy, unspecified: Secondary | ICD-10-CM

## 2014-07-27 ENCOUNTER — Encounter: Payer: Self-pay | Admitting: Neurology

## 2014-07-27 LAB — IFE AND PE, SERUM
ALPHA 1: 0.3 g/dL (ref 0.1–0.4)
Albumin SerPl Elph-Mcnc: 3.9 g/dL (ref 3.2–5.6)
Albumin/Glob SerPl: 1.4 (ref 0.7–2.0)
Alpha2 Glob SerPl Elph-Mcnc: 0.5 g/dL (ref 0.4–1.2)
B-Globulin SerPl Elph-Mcnc: 1 g/dL (ref 0.6–1.3)
Gamma Glob SerPl Elph-Mcnc: 1.1 g/dL (ref 0.5–1.6)
Globulin, Total: 2.9 g/dL (ref 2.0–4.5)
IGA/IMMUNOGLOBULIN A, SERUM: 361 mg/dL (ref 91–414)
IGM (IMMUNOGLOBULIN M), SRM: 62 mg/dL (ref 40–230)
IgG (Immunoglobin G), Serum: 1278 mg/dL (ref 700–1600)
TOTAL PROTEIN: 6.8 g/dL (ref 6.0–8.5)

## 2014-07-27 LAB — HEAVY METALS, BLOOD
Arsenic: 8 ug/L (ref 2–23)
Lead, Blood: 2 ug/dL (ref 0–19)
Mercury: NOT DETECTED ug/L (ref 0.0–14.9)

## 2014-07-27 LAB — TSH: TSH: 3.46 u[IU]/mL (ref 0.450–4.500)

## 2014-07-27 LAB — RPR: RPR: NONREACTIVE

## 2014-07-27 LAB — VITAMIN B12: Vitamin B-12: 801 pg/mL (ref 211–946)

## 2014-07-27 LAB — HIV ANTIBODY (ROUTINE TESTING W REFLEX)
HIV 1/O/2 Abs-Index Value: <1 (ref <1.00)
HIV-1/HIV-2 Ab: NONREACTIVE

## 2014-07-27 LAB — SEDIMENTATION RATE: SED RATE: 2 mm/h (ref 0–40)

## 2014-07-29 DIAGNOSIS — R202 Paresthesia of skin: Secondary | ICD-10-CM

## 2014-07-29 HISTORY — DX: Paresthesia of skin: R20.2

## 2014-07-29 NOTE — Progress Notes (Signed)
  [facility]    Provider:  Dr Jaynee Eagles Referring Provider: Marton Redwood, MD Primary Care Physician:  Marton Redwood, MD  History: Kellie Humphrey is a 68 y.o. female here as a follow up for tremors and paresthesias and RLS. Still taking gabapoentin 600mg  bid for RLS and tremors. Helps with the RLS and tremors but not with the paresthesias. Paresthesias described as sensitive skin and prickles all over her body: Her hands and feet and her thorax, sometimes even her hair. Usually general and symmetric but describes some focal focal paresthesias consistently in the left ulnar aspect of the left arm. The other sensations in the toes and fingers feel like they fell asleep. No side effects from the gabapentin. She tried going up to 3x a day dosing and it didn't help at all with the strange sensations. She has been taking it for years. As far as the more focal sensations in her left hand and forearm, she denies weakness, not dropping objects - Right inbetween digit 4 and 5. Numbness in hand started 6 months ago. The hematoma 20 years ago on the ulnar aspect of the wrist removed however the sensations in the left ulnar hand only started 6 months ago. No other symptoms. Father has tremors.  Summary  Nerve conduction studies were performed on the left upper and right lower extremities:  The left Median motor nerve showed normal conductions with normal F Wave latency The left Ulnar motor nerve showed normal conductions with normal F Wave latency The right Peroneal motor nerve showed normal conductions with normal F Wave latency The right Tibial motor nerve showed normal conductions with normal F Wave latency The left second-digit Median sensory nerve conduction was within normal limits The left fifth-digit Ulnar sensory nerve conduction was within normal limits The left Radial sensory nerve conduction was within normal limits The right Sural sensory nerve conduction was within normal  limits The left Median/Ulnar (palm) comparison nerve showed normal peak latency difference  EMG Needle study was performed on selected left upper and right lower extremity muscles:   The Deltoid, Triceps, Pronator Teres, Flexor Digitorum Profundus(Ulnar heads), Opponens Pollicis, First Dorsal interosseous, Vastus Medialis, Anterior Tibialis, Medial Gastrocnemius, Extensor Hallucis Longus and Abductor Hallucis muscles were within normal limits.  Conclusion: This is a normal study. No electrophysiologic evidence for ulnar or median neuropathy, peripheral polyneuropathy or radiculopathy. However a small fiber neuropathy could be responsible for such symptoms and still evade detection by this study. Clinical correlation recommended.    Sarina Ill, MD  [facility] [address] [place], [place] [address]  Phone [phone] Fax [phone]

## 2014-08-02 ENCOUNTER — Telehealth: Payer: Self-pay

## 2014-08-02 NOTE — Telephone Encounter (Signed)
-----   Message from Melvenia Beam, MD sent at 07/31/2014 11:22 AM EST ----- Please let patient know that their labs were normal. Thank you.

## 2014-08-02 NOTE — Telephone Encounter (Signed)
Spoke to patient. Gave results.

## 2014-08-16 ENCOUNTER — Other Ambulatory Visit: Payer: Self-pay | Admitting: Dermatology

## 2014-09-13 ENCOUNTER — Other Ambulatory Visit (HOSPITAL_COMMUNITY): Payer: Self-pay | Admitting: Internal Medicine

## 2014-09-25 ENCOUNTER — Telehealth (HOSPITAL_COMMUNITY): Payer: Self-pay | Admitting: Vascular Surgery

## 2014-09-25 ENCOUNTER — Other Ambulatory Visit (HOSPITAL_COMMUNITY): Payer: Self-pay | Admitting: Internal Medicine

## 2014-09-25 NOTE — Telephone Encounter (Signed)
Pt need a refill Metorprolol sent to [facility].. Pt needs it to be sent because first of the year she wont be able to use [facility].[name] Please call pt when you send the prescription Pleas advise

## 2014-09-29 HISTORY — PX: SHOULDER ARTHROSCOPY: SHX128

## 2014-10-18 ENCOUNTER — Ambulatory Visit
Admission: RE | Admit: 2014-10-18 | Discharge: 2014-10-18 | Disposition: A | Discharge status: Home / Self Care | Payer: PPO | Source: Ambulatory Visit | Attending: Internal Medicine | Admitting: Internal Medicine

## 2014-10-18 DIAGNOSIS — C50919 Malignant neoplasm of unspecified site of unspecified female breast: Secondary | ICD-10-CM

## 2014-10-18 DIAGNOSIS — R922 Inconclusive mammogram: Secondary | ICD-10-CM

## 2014-11-22 ENCOUNTER — Encounter (HOSPITAL_COMMUNITY): Payer: Self-pay

## 2014-11-22 ENCOUNTER — Ambulatory Visit (HOSPITAL_COMMUNITY)
Admission: RE | Admit: 2014-11-22 | Discharge: 2014-11-22 | Disposition: A | Discharge status: Home / Self Care | Payer: PPO | Source: Ambulatory Visit | Attending: Adult Health | Admitting: Adult Health

## 2014-11-22 DIAGNOSIS — Z79899 Other long term (current) drug therapy: Secondary | ICD-10-CM | POA: Diagnosis not present

## 2014-11-22 DIAGNOSIS — R002 Palpitations: Secondary | ICD-10-CM | POA: Diagnosis not present

## 2014-11-22 DIAGNOSIS — Z7982 Long term (current) use of aspirin: Secondary | ICD-10-CM | POA: Insufficient documentation

## 2014-11-22 DIAGNOSIS — I341 Nonrheumatic mitral (valve) prolapse: Secondary | ICD-10-CM | POA: Diagnosis not present

## 2014-11-22 DIAGNOSIS — I059 Rheumatic mitral valve disease, unspecified: Secondary | ICD-10-CM

## 2014-11-22 DIAGNOSIS — I471 Supraventricular tachycardia: Secondary | ICD-10-CM | POA: Diagnosis not present

## 2014-11-22 DIAGNOSIS — I491 Atrial premature depolarization: Secondary | ICD-10-CM

## 2014-11-22 MED ORDER — METOPROLOL TARTRATE 25 MG PO TABS: ORAL_TABLET | ORAL | Status: DC

## 2014-11-22 NOTE — Patient Instructions (Signed)
Your physician has requested that you have an echocardiogram. Echocardiography is a painless test that uses sound waves to create images of your heart. It provides your doctor with information about the size and shape of your heart and how well your heart's chambers and valves are working. This procedure takes approximately one hour. There are no restrictions for this procedure.  We will contact you in 1 year to schedule your next appointment.  

## 2014-11-22 NOTE — Progress Notes (Signed)
Patient ID: Kellie Humphrey, female   DOB: [birth year], 69 y.o.   MRN: [id number] PCP: Dr. Brigitte Pulse  HPI: Kellie Humphrey is a very pleasant 69year-old woman with a history of mild mitral valve prolapse and associated PACs.  She had an exercise echo done for chest pain in 2009, this showed good exercise capacity with normal stress echo images.  She did have occasional PACs with brief 5-6 beat run of atrial tachycardia.  Follow up: Overall feels great. Denies SOB/PND/Orthopnea. Exercises 3-4 times a week walking 3-4 miles. No presyncope.   08/31/2013 Echo: EF 50-55% LVIDd 4.6 MV moderately thickened. Mild bileaflet MVP mild to moderate posterior MR.  No pulmonary HTN.    ROS: All systems negative except as listed in HPI, PMH and Problem List.  Past Medical History  Diagnosis Date  . Breast cancer     s/p lumpectomy  . MVP (mitral valve prolapse)   . PAC (premature atrial contraction)   . Palpitations     Current Outpatient Prescriptions  Medication Sig Dispense Refill  . amitriptyline (ELAVIL) 50 MG tablet Take 50 mg by mouth at bedtime as needed for sleep.    [name] aspirin 81 MG tablet Take 81 mg by mouth daily.    . Biotin 5000 MCG CAPS Take 5,000 mcg by mouth daily.      [name] CALCIUM CITRATE PO Take 500 mg by mouth.    . cholecalciferol (VITAMIN D) 1000 UNITS tablet Take 1,000 Units by mouth daily. Taking 3 Days a Week    . fish oil-omega-3 fatty acids 1000 MG capsule Take 2 g by mouth daily.      [name] gabapentin (NEURONTIN) 300 MG capsule Take 900 mg by mouth 2 (two) times daily.     . metoprolol tartrate (LOPRESSOR) 25 MG tablet TAKE 1 BY MOUTH TWICE DAILY (GENERIC FOR LOPRESSOR 0- NO FURTHER REFILLS WILL BE GIVEN WITHOUT APPOINTMENT 180 tablet 0  . multivitamin (THERAGRAN) per tablet Take 1 tablet by mouth daily.      . nitrofurantoin (MACRODANTIN) 50 MG capsule Take 1 capsule (50 mg total) by mouth daily. 30 capsule 6  . tamoxifen (NOLVADEX) 20 MG tablet Take 1 tablet (20 mg total) by mouth daily. 90  tablet 3   No current facility-administered medications for this encounter.   Filed Vitals:   11/22/14 1121  BP: 103/64  Pulse: 94  Resp: 18  Weight: 140 lb (63.504 kg)  SpO2: 99%   PHYSICAL EXAM: General:  Well appearing. No resp difficulty HEENT: normal Neck: supple. JVP flat. Carotids 2+ bilaterally; no bruits. No lymphadenopathy or thryomegaly appreciated. Cor: PMI normal. Regular rate & rhythm. No rubs, gallops. + mid-systolic click and faint MR Lungs: clear Abdomen: soft, nontender, nondistended. No hepatosplenomegaly. No bruits or masses. Good bowel sounds. Extremities: no cyanosis, clubbing, rash, edema Neuro: alert & orientedx3, cranial nerves grossly intact. Moves all 4 extremities w/o difficulty. Affect pleasant.  ASSESSMENT & PLAN:  1) PACs - Having occasional palpitations thought to be from PACs. Continue current dose of lopressor.   2) MVP with MR - Will repeat ECHO and repeat annually. She was instructed to call with concerns.    Follow up in 1 year.   Kellie Bieser NP-C  11:32 AM

## 2014-11-29 ENCOUNTER — Ambulatory Visit (HOSPITAL_COMMUNITY)
Admission: RE | Admit: 2014-11-29 | Discharge: 2014-11-29 | Disposition: A | Discharge status: Home / Self Care | Payer: PPO | Source: Ambulatory Visit | Attending: Internal Medicine | Admitting: Internal Medicine

## 2014-11-29 DIAGNOSIS — I340 Nonrheumatic mitral (valve) insufficiency: Secondary | ICD-10-CM

## 2014-11-29 DIAGNOSIS — I059 Rheumatic mitral valve disease, unspecified: Secondary | ICD-10-CM | POA: Insufficient documentation

## 2014-11-29 NOTE — Progress Notes (Signed)
  Echocardiogram 2D Echocardiogram has been performed.  Kellie Humphrey 11/29/2014, 2:10 PM

## 2014-12-12 ENCOUNTER — Telehealth (HOSPITAL_COMMUNITY): Payer: Self-pay | Admitting: Vascular Surgery

## 2014-12-12 NOTE — Telephone Encounter (Signed)
Pt called she would like echo results from 2 week ago.Kellie Humphrey Please advise

## 2014-12-12 NOTE — Telephone Encounter (Signed)
Will have Dr Haroldine Laws review echo

## 2014-12-14 NOTE — Telephone Encounter (Signed)
Dr Haroldine Laws personally reviewed pt's echo and states there is no change from previous, recheck in 1 year, pt aware

## 2015-02-22 ENCOUNTER — Other Ambulatory Visit: Payer: Self-pay | Admitting: *Deleted

## 2015-02-22 DIAGNOSIS — C50919 Malignant neoplasm of unspecified site of unspecified female breast: Secondary | ICD-10-CM

## 2015-02-28 ENCOUNTER — Other Ambulatory Visit (HOSPITAL_BASED_OUTPATIENT_CLINIC_OR_DEPARTMENT_OTHER): Payer: PPO

## 2015-02-28 DIAGNOSIS — C50919 Malignant neoplasm of unspecified site of unspecified female breast: Secondary | ICD-10-CM

## 2015-02-28 LAB — CBC WITH DIFFERENTIAL/PLATELET
BASO%: 0.7 % (ref 0.0–2.0)
BASOS ABS: 0 10*3/uL (ref 0.0–0.1)
EOS ABS: 0.2 10*3/uL (ref 0.0–0.5)
EOS%: 3.9 % (ref 0.0–7.0)
HEMATOCRIT: 37.6 % (ref 34.8–46.6)
HGB: 12.3 g/dL (ref 11.6–15.9)
LYMPH%: 35.1 % (ref 14.0–49.7)
MCH: 29.2 pg (ref 25.1–34.0)
MCHC: 32.6 g/dL (ref 31.5–36.0)
MCV: 89.6 fL (ref 79.5–101.0)
MONO#: 0.5 10*3/uL (ref 0.1–0.9)
MONO%: 11.9 % (ref 0.0–14.0)
NEUT#: 2.2 10*3/uL (ref 1.5–6.5)
NEUT%: 48.4 % (ref 38.4–76.8)
Platelets: 209 10*3/uL (ref 145–400)
RBC: 4.2 10*6/uL (ref 3.70–5.45)
RDW: 14 % (ref 11.2–14.5)
WBC: 4.5 10*3/uL (ref 3.9–10.3)
lymph#: 1.6 10*3/uL (ref 0.9–3.3)

## 2015-02-28 LAB — COMPREHENSIVE METABOLIC PANEL (CC13)
ALK PHOS: 52 U/L (ref 40–150)
ALT: 16 U/L (ref 0–55)
ANION GAP: 7 meq/L (ref 3–11)
AST: 24 U/L (ref 5–34)
Albumin: 3.4 g/dL — ABNORMAL LOW (ref 3.5–5.0)
BUN: 11.5 mg/dL (ref 7.0–26.0)
CHLORIDE: 108 meq/L (ref 98–109)
CO2: 28 meq/L (ref 22–29)
CREATININE: 0.8 mg/dL (ref 0.6–1.1)
Calcium: 8.7 mg/dL (ref 8.4–10.4)
EGFR: 76 mL/min/{1.73_m2} — ABNORMAL LOW (ref >90)
GLUCOSE: 75 mg/dL (ref 70–140)
Potassium: 3.9 mEq/L (ref 3.5–5.1)
Sodium: 143 mEq/L (ref 136–145)
Total Bilirubin: 0.27 mg/dL (ref 0.20–1.20)
Total Protein: 6.5 g/dL (ref 6.4–8.3)

## 2015-03-06 ENCOUNTER — Telehealth: Payer: Self-pay | Admitting: Oncology

## 2015-03-06 ENCOUNTER — Ambulatory Visit (HOSPITAL_BASED_OUTPATIENT_CLINIC_OR_DEPARTMENT_OTHER): Payer: PPO | Admitting: Oncology

## 2015-03-06 VITALS — BP 106/67 | HR 87 | Temp 97.9°F | Resp 18 | Ht 67.0 in | Wt 139.9 lb

## 2015-03-06 DIAGNOSIS — Z853 Personal history of malignant neoplasm of breast: Secondary | ICD-10-CM | POA: Diagnosis not present

## 2015-03-06 DIAGNOSIS — Z79810 Long term (current) use of selective estrogen receptor modulators (SERMs): Secondary | ICD-10-CM

## 2015-03-06 DIAGNOSIS — C50911 Malignant neoplasm of unspecified site of right female breast: Secondary | ICD-10-CM

## 2015-03-06 NOTE — Progress Notes (Signed)
ID: JARELY JUNCAJ   DOB: 29-Jan-1946  MR#: [id number]  JSH#:[id number]  PCP: Marton Redwood, MD   HISTORY OF PRESENT ILLNESS: From the original intake note:  She had a screening mammogram October 7th which showed a possible mass in the right breast.  Additional views October 13th showed a spiculated mass at the 4 o'clock position which mammographically measured 1.4 cm.  It was palpable by Dr. Sadie Haber at that time.  An ultrasound showed a spiculated hypoechoic mass measuring 1.2 cm by ultrasound.    Core biopsy was scheduled for the next day, October 14th, and showed ([id number]) an invasive ductal carcinoma, appearing low grade, 100% estrogen and 100% progesterone receptor positive with a low MIB-1 at 11% and negative for the HER-2 receptor overexpression test at 1+.    Bilateral breast MRIs October 22nd.  There was no evidence of lymphadenopathy on either side in the right breast.  There was a 1.4 cm spiculated enhancing mass, but no other area of abnormality.  With this information, the patient underwent needle localization, right lumpectomy and sentinel lymph node biopsy on July 25, 2008.  The final pathology ([id number]) showed a 1.3 cm invasive ductal carcinoma, grade 1, with ample margins, positive for lymphovascular invasion, but with 0 of 2 sentinel lymph nodes involved.   Her subsequent history is as detailed below  INTERVAL HISTORY: Kellie Humphrey returns today for followup of her breast cancer. She continues on tamoxifen, with excellent tolerance. She has rare hot flashes. She does not have any vaginal wetness or discharge. She obtains a drug at a good price.  REVIEW OF SYSTEMS: She walks 4 miles at a time about 3 times a week. Sometimes she also does weights. She has a history of interstitial cystitis and she is doing fine on nitrofurantoin daily. Her restless legs are well-controlled on her current medications. She has a benign tremor which is also well controlled. Aside from these  issues a detailed review of systems today was entirely negative.  PAST MEDICAL HISTORY: Past Medical History  Diagnosis Date  . Breast cancer     s/p lumpectomy  . MVP (mitral valve prolapse)   . PAC (premature atrial contraction)   . Palpitations   She had a melanoma removed from the right side by Benson [place] in 1982.  She had a hemangioma removed from the left elbow in 1987.  She is status post tonsillectomy. She has a history of mitral valve prolapse and has been evaluated for this most recently by Pierre Bali, and she had an echocardiogram November 26, 2007 at [facility] which showed a normal left ventricle with an ejection fraction in the 55 to 65%, with mild mitral valve prolapse and regurgitation.  There was a small pericardial effusion.  Her understanding is that this is not enough of a concern that she would need antibiotic prophylaxis for dental or similar procedures.    PAST SURGICAL HISTORY: Past Surgical History  Procedure Laterality Date  . Breast lumpectomy    . Melanoma excision Right 1980    thigh  . Hemangioma excision Left 1990    arm  . Breast biopsy  2009  . Neck surgery  10/2009    C5-6 fusion    FAMILY HISTORY The patient's father is in his mid 56s. The patient's mother is also in her mid 63s.  She had a myocardial infarction remotely but is doing "okay" currently.  The patient has one sister in good health.  There is no history of breast  or ovarian cancer in the family to her knowledge.  [name]  GYNECOLOGIC HISTORY: She is GX P2, first pregnancy to term age 64.  She went through the change of life more than 10 years ago and had been on hormones until her breast cancer diagnosis.    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  They have a daughter, Kellie Humphrey, who lives in [place] and has two children; a daughter, Kellie Humphrey, who lives in [place] and has one child.  They are members of [employer].   ADVANCED  DIRECTIVES: in place  HEALTH MAINTENANCE: History  Substance Use Topics  . Smoking status: Never Smoker   . Smokeless tobacco: Never Used  . Alcohol Use: 0.0 oz/week     Comment: Socially     Colonoscopy: 2008  PAP: UTD/Neal  Bone density: osteopenia  Lipid panel:  Allergies  Allergen Reactions  . Codeine Nausea Only    Current Outpatient Prescriptions  Medication Sig Dispense Refill  . amitriptyline (ELAVIL) 50 MG tablet Take 50 mg by mouth at bedtime as needed for sleep.    [name] aspirin 81 MG tablet Take 81 mg by mouth daily.    . Biotin 5000 MCG CAPS Take 5,000 mcg by mouth daily.      [name] CALCIUM CITRATE PO Take 500 mg by mouth.    . cholecalciferol (VITAMIN D) 1000 UNITS tablet Take 1,000 Units by mouth daily. Taking 3 Days a Week    . fish oil-omega-3 fatty acids 1000 MG capsule Take 2 g by mouth daily.      [name] gabapentin (NEURONTIN) 300 MG capsule Take 900 mg by mouth 2 (two) times daily.     . metoprolol tartrate (LOPRESSOR) 25 MG tablet TAKE 1 BY MOUTH TWICE DAILY (GENERIC FOR LOPRESSOR) 180 tablet 3  . multivitamin (THERAGRAN) per tablet Take 1 tablet by mouth daily.      . nitrofurantoin (MACRODANTIN) 50 MG capsule Take 1 capsule (50 mg total) by mouth daily. 30 capsule 6  . tamoxifen (NOLVADEX) 20 MG tablet Take 1 tablet (20 mg total) by mouth daily. 90 tablet 3   No current facility-administered medications for this visit.    OBJECTIVE: Middle-aged white woman in no acute distress Filed Vitals:   03/06/15 1433  BP: 106/67  Pulse: 87  Temp: 97.9 F (36.6 C)  Resp: 18     Body mass index is 21.91 kg/(m^2).    ECOG FS: 0 Sclerae unicteric, pupils round and equal Oropharynx clear and moist-- no thrush or other lesions No cervical or supraclavicular adenopathy Lungs no rales or rhonchi Heart regular rate and rhythm Abd soft, nontender, positive bowel sounds MSK no focal spinal tenderness, no upper extremity lymphedema Neuro: nonfocal, well oriented, appropriate  affect Breasts: The right breast is status post lumpectomy and radiation. There is no evidence of local recurrence. There is minimal tenderness to palpation in the inferior aspect of the breast. There are some telangiectasias. The right axilla is benign per the left breast is unremarkable. `   LAB RESULTS: Lab Results  Component Value Date   WBC 4.5 02/28/2015   NEUTROABS 2.2 02/28/2015   HGB 12.3 02/28/2015   HCT 37.6 02/28/2015   MCV 89.6 02/28/2015   PLT 209 02/28/2015      Chemistry      Component Value Date/Time   NA 143 02/28/2015 1426   NA 141 10/05/2013 1639   K 3.9 02/28/2015 1426  K 3.4* 10/05/2013 1639   CL 104 10/05/2013 1639   CL 108* 02/01/2013 0953   CO2 28 02/28/2015 1426   CO2 31 10/05/2013 1639   BUN 11.5 02/28/2015 1426   BUN 11 10/05/2013 1639   CREATININE 0.8 02/28/2015 1426   CREATININE 0.8 10/05/2013 1639      Component Value Date/Time   CALCIUM 8.7 02/28/2015 1426   CALCIUM 9.5 10/05/2013 1639   ALKPHOS 52 02/28/2015 1426   ALKPHOS 68 08/04/2011 1416   AST 24 02/28/2015 1426   AST 24 08/04/2011 1416   ALT 16 02/28/2015 1426   ALT 20 08/04/2011 1416   BILITOT 0.27 02/28/2015 1426   BILITOT 0.4 08/04/2011 1416       Lab Results  Component Value Date   LABCA2 23 08/04/2011    No components found for: [id number]  No results for input(s): INR in the last 168 hours.  Urinalysis No results found for: COLORURINE  STUDIES: Study Result     CLINICAL DATA: History of right breast cancer status post lumpectomy in 2009. The patient has no current complaints.  EXAM: DIGITAL DIAGNOSTIC BILATERAL MAMMOGRAM WITH CAD  COMPARISON: With priors.  ACR Breast Density Category c: The breast tissue is heterogeneously dense, which may obscure small masses.  FINDINGS: Stable lumpectomy changes are seen in the right breast. No suspicious mass or malignant type microcalcifications identified in either breast.  Mammographic images were  processed with CAD.  IMPRESSION: No evidence of malignancy in either breast.  RECOMMENDATION: Bilateral diagnostic mammogram in 1 year is recommended.  I have discussed the findings and recommendations with the patient. Results were also provided in writing at the conclusion of the visit. If applicable, a reminder letter will be sent to the patient regarding the next appointment.  BI-RADS CATEGORY 2: Benign Finding(s)   Electronically Signed  By: Lillia Mountain M.D.  On: 10/18/2014 11:48        ASSESSMEN 69 y.o. [place] woman status post right lumpectomy and sentinel lymph node sampling October 2009 for a T1c N0, stage IA  invasive ductal carcinoma, grade 1, strongly estrogen and progesterone receptor positive, HER2 negative, with a proliferation fraction under 13.  She received adjuvant radiation and then started tamoxifen in February 2010.  When she started Cymbalta, we switched her to letrozole beginning January 2012. She then went off letrozole in May 2013, resuming tamoxifen August of 2013  PLAN: Tiphany is into her sixth year of antiestrogen therapy. We discussed the recent data that shows that 10 years of letrozole further decreases the risk of recurrence as opposed to 5 years of letrozole. Of course we have similar data with tamoxifen. In both cases the difference is in the 2-3% range.  This is certainly marginal. On the other hand Nene is tolerating tamoxifen with no side effects of consequence, and obtaining it at a very good price.  I gave her the choice and what she would like to do is continue tamoxifen until she has a total of 10 years of antiestrogen. That will take [place] to 2020. Since she would like to space her visits with Dr. Brigitte Pulse and with me by 6 months, if I start seeing her in February next year, after her mammography, she could see Dr. Brigitte Pulse August of next year and we could keep tag team in her in that fashion until she completes her 10 years of  anti-estrogens.  She was interested in stopping the calcium. So she continues on the vitamin D I have no  problems with  She knows to call for any problems that may develop before her next visit here.     Tianni Escamilla C    03/06/2015

## 2015-03-06 NOTE — Telephone Encounter (Signed)
Appointments made and avs printed for patient °

## 2015-03-26 ENCOUNTER — Other Ambulatory Visit: Payer: Self-pay

## 2015-04-09 ENCOUNTER — Other Ambulatory Visit: Payer: Self-pay | Admitting: Oncology

## 2015-04-09 NOTE — Telephone Encounter (Signed)
Last ov 03/16/15.  Next OV 11/13/15.  Chart reviewed

## 2015-04-25 ENCOUNTER — Telehealth: Payer: Self-pay | Admitting: Oncology

## 2015-04-25 NOTE — Telephone Encounter (Signed)
Confirmed appointment change for 11/13/15

## 2015-09-11 ENCOUNTER — Other Ambulatory Visit: Payer: Self-pay | Admitting: Obstetrics & Gynecology

## 2015-09-11 DIAGNOSIS — Z853 Personal history of malignant neoplasm of breast: Secondary | ICD-10-CM

## 2015-10-17 DIAGNOSIS — Z6821 Body mass index (BMI) 21.0-21.9, adult: Secondary | ICD-10-CM | POA: Diagnosis not present

## 2015-10-17 DIAGNOSIS — M8588 Other specified disorders of bone density and structure, other site: Secondary | ICD-10-CM | POA: Diagnosis not present

## 2015-10-17 DIAGNOSIS — Z1272 Encounter for screening for malignant neoplasm of vagina: Secondary | ICD-10-CM | POA: Diagnosis not present

## 2015-10-17 DIAGNOSIS — N3091 Cystitis, unspecified with hematuria: Secondary | ICD-10-CM | POA: Diagnosis not present

## 2015-10-17 DIAGNOSIS — N958 Other specified menopausal and perimenopausal disorders: Secondary | ICD-10-CM | POA: Diagnosis not present

## 2015-10-17 DIAGNOSIS — Z124 Encounter for screening for malignant neoplasm of cervix: Secondary | ICD-10-CM | POA: Diagnosis not present

## 2015-10-22 ENCOUNTER — Ambulatory Visit
Admission: RE | Admit: 2015-10-22 | Discharge: 2015-10-22 | Disposition: A | Discharge status: Home / Self Care | Payer: PPO | Source: Ambulatory Visit | Attending: Obstetrics & Gynecology | Admitting: Obstetrics & Gynecology

## 2015-10-22 DIAGNOSIS — Z853 Personal history of malignant neoplasm of breast: Secondary | ICD-10-CM

## 2015-10-22 DIAGNOSIS — R922 Inconclusive mammogram: Secondary | ICD-10-CM | POA: Diagnosis not present

## 2015-10-31 DIAGNOSIS — D225 Melanocytic nevi of trunk: Secondary | ICD-10-CM | POA: Diagnosis not present

## 2015-10-31 DIAGNOSIS — D2271 Melanocytic nevi of right lower limb, including hip: Secondary | ICD-10-CM | POA: Diagnosis not present

## 2015-10-31 DIAGNOSIS — L821 Other seborrheic keratosis: Secondary | ICD-10-CM | POA: Diagnosis not present

## 2015-10-31 DIAGNOSIS — D2261 Melanocytic nevi of right upper limb, including shoulder: Secondary | ICD-10-CM | POA: Diagnosis not present

## 2015-10-31 DIAGNOSIS — D2272 Melanocytic nevi of left lower limb, including hip: Secondary | ICD-10-CM | POA: Diagnosis not present

## 2015-10-31 DIAGNOSIS — D2262 Melanocytic nevi of left upper limb, including shoulder: Secondary | ICD-10-CM | POA: Diagnosis not present

## 2015-10-31 DIAGNOSIS — Z85820 Personal history of malignant melanoma of skin: Secondary | ICD-10-CM | POA: Diagnosis not present

## 2015-10-31 DIAGNOSIS — L819 Disorder of pigmentation, unspecified: Secondary | ICD-10-CM | POA: Diagnosis not present

## 2015-10-31 DIAGNOSIS — D1801 Hemangioma of skin and subcutaneous tissue: Secondary | ICD-10-CM | POA: Diagnosis not present

## 2015-11-05 ENCOUNTER — Other Ambulatory Visit: Payer: Self-pay

## 2015-11-05 DIAGNOSIS — C50911 Malignant neoplasm of unspecified site of right female breast: Secondary | ICD-10-CM

## 2015-11-06 ENCOUNTER — Other Ambulatory Visit (HOSPITAL_BASED_OUTPATIENT_CLINIC_OR_DEPARTMENT_OTHER): Payer: PPO

## 2015-11-06 DIAGNOSIS — Z853 Personal history of malignant neoplasm of breast: Secondary | ICD-10-CM | POA: Diagnosis not present

## 2015-11-06 DIAGNOSIS — C50911 Malignant neoplasm of unspecified site of right female breast: Secondary | ICD-10-CM

## 2015-11-06 LAB — COMPREHENSIVE METABOLIC PANEL
ALBUMIN: 4 g/dL (ref 3.5–5.0)
ALT: 15 U/L (ref 0–55)
AST: 20 U/L (ref 5–34)
Alkaline Phosphatase: 71 U/L (ref 40–150)
Anion Gap: 10 mEq/L (ref 3–11)
BUN: 11.8 mg/dL (ref 7.0–26.0)
CO2: 27 meq/L (ref 22–29)
Calcium: 9.2 mg/dL (ref 8.4–10.4)
Chloride: 106 mEq/L (ref 98–109)
Creatinine: 0.8 mg/dL (ref 0.6–1.1)
EGFR: 79 mL/min/{1.73_m2} — ABNORMAL LOW (ref >90)
GLUCOSE: 91 mg/dL (ref 70–140)
POTASSIUM: 3.9 meq/L (ref 3.5–5.1)
SODIUM: 142 meq/L (ref 136–145)
Total Bilirubin: <0.3 mg/dL (ref 0.20–1.20)
Total Protein: 7.5 g/dL (ref 6.4–8.3)

## 2015-11-06 LAB — CBC WITH DIFFERENTIAL/PLATELET
BASO%: 0.6 % (ref 0.0–2.0)
BASOS ABS: 0 10*3/uL (ref 0.0–0.1)
EOS%: 3.8 % (ref 0.0–7.0)
Eosinophils Absolute: 0.2 10*3/uL (ref 0.0–0.5)
HCT: 39.5 % (ref 34.8–46.6)
HGB: 12.8 g/dL (ref 11.6–15.9)
LYMPH%: 28 % (ref 14.0–49.7)
MCH: 29.3 pg (ref 25.1–34.0)
MCHC: 32.5 g/dL (ref 31.5–36.0)
MCV: 90.2 fL (ref 79.5–101.0)
MONO#: 0.5 10*3/uL (ref 0.1–0.9)
MONO%: 10.9 % (ref 0.0–14.0)
NEUT%: 56.7 % (ref 38.4–76.8)
NEUTROS ABS: 2.6 10*3/uL (ref 1.5–6.5)
Platelets: 197 10*3/uL (ref 145–400)
RBC: 4.38 10*6/uL (ref 3.70–5.45)
RDW: 13.8 % (ref 11.2–14.5)
WBC: 4.5 10*3/uL (ref 3.9–10.3)
lymph#: 1.3 10*3/uL (ref 0.9–3.3)

## 2015-11-09 DIAGNOSIS — E038 Other specified hypothyroidism: Secondary | ICD-10-CM | POA: Diagnosis not present

## 2015-11-13 ENCOUNTER — Other Ambulatory Visit (HOSPITAL_COMMUNITY): Payer: Self-pay | Admitting: Adult Health

## 2015-11-13 ENCOUNTER — Ambulatory Visit: Payer: PPO | Admitting: Oncology

## 2015-11-13 ENCOUNTER — Ambulatory Visit (HOSPITAL_BASED_OUTPATIENT_CLINIC_OR_DEPARTMENT_OTHER): Payer: PPO | Admitting: Oncology

## 2015-11-13 ENCOUNTER — Telehealth: Payer: Self-pay | Admitting: Oncology

## 2015-11-13 VITALS — BP 120/70 | HR 67 | Temp 98.7°F | Resp 18 | Ht 67.0 in | Wt 139.3 lb

## 2015-11-13 DIAGNOSIS — Z853 Personal history of malignant neoplasm of breast: Secondary | ICD-10-CM

## 2015-11-13 DIAGNOSIS — R682 Dry mouth, unspecified: Secondary | ICD-10-CM

## 2015-11-13 DIAGNOSIS — C50511 Malignant neoplasm of lower-outer quadrant of right female breast: Secondary | ICD-10-CM

## 2015-11-13 DIAGNOSIS — Z87440 Personal history of urinary (tract) infections: Secondary | ICD-10-CM | POA: Diagnosis not present

## 2015-11-13 DIAGNOSIS — N3010 Interstitial cystitis (chronic) without hematuria: Secondary | ICD-10-CM | POA: Diagnosis not present

## 2015-11-13 MED ORDER — TAMOXIFEN CITRATE 20 MG PO TABS
20.0000 mg | ORAL_TABLET | Freq: Every day | ORAL | Status: DC
Start: 2015-11-13 — End: 2016-12-10

## 2015-11-13 NOTE — Progress Notes (Signed)
ID: RENNIE HACK   DOB: 13-May-1946  MR#: [id number]  POE#:[id number]  PCP: Marton Redwood, MD   HISTORY OF PRESENT ILLNESS: From the original intake note:  She had a screening mammogram October 7th which showed a possible mass in the right breast.  Additional views October 13th showed a spiculated mass at the 4 o'clock position which mammographically measured 1.4 cm.  It was palpable by Dr. Sadie Haber at that time.  An ultrasound showed a spiculated hypoechoic mass measuring 1.2 cm by ultrasound.    Core biopsy was scheduled for the next day, October 14th, and showed ([id number] and [id number]) an invasive ductal carcinoma, appearing low grade, 100% estrogen and 100% progesterone receptor positive with a low MIB-1 at 11% and negative for the HER-2 receptor overexpression test at 1+.    Bilateral breast MRIs October 22nd.  There was no evidence of lymphadenopathy on either side in the right breast.  There was a 1.4 cm spiculated enhancing mass, but no other area of abnormality.  With this information, the patient underwent needle localization, right lumpectomy and sentinel lymph node biopsy on July 25, 2008.  The final pathology ([id number]) showed a 1.3 cm invasive ductal carcinoma, grade 1, with ample margins, positive for lymphovascular invasion, but with 0 of 2 sentinel lymph nodes involved.   Her subsequent history is as detailed below  INTERVAL HISTORY: Kellie Humphrey returns today for followup of her estrogen receptor positive breast cancer. Kellie Humphrey continues on tamoxifen. Hot flashes and vaginal wetness are not concerns. She obtains a drug at a very good progress.  REVIEW OF SYSTEMS: [place]. She walks 3-4 miles about 4 days a week. She does about 10-15 minutes of stretching a couple of days a week as well. The only complaint she has is that she feels "creaky". She has interstitial cystitis, which is stable. She tends to get a dry mouth from the  Elavil, and she choose, to take care of that problem. However the gum has sorbitol, which she was not aware of. That may be causing some gas and some abdominal cramping. Aside from these issues a detailed review of systems today was stable  PAST MEDICAL HISTORY: Past Medical History  Diagnosis Date  . Breast cancer     s/p lumpectomy  . MVP (mitral valve prolapse)   . PAC (premature atrial contraction)   . Palpitations   She had a melanoma removed from the right side by Benson [place] in 1982.  She had a hemangioma removed from the left elbow in 1987.  She is status post tonsillectomy. She has a history of mitral valve prolapse and has been evaluated for this most recently by Pierre Bali, and she had an echocardiogram November 26, 2007 at [facility] which showed a normal left ventricle with an ejection fraction in the 55 to 65%, with mild mitral valve prolapse and regurgitation.  There was a small pericardial effusion.  Her understanding is that this is not enough of a concern that she would need antibiotic prophylaxis for dental or similar procedures.    PAST SURGICAL HISTORY: Past Surgical History  Procedure Laterality Date  . Breast lumpectomy    . Melanoma excision Right 1980    thigh  . Hemangioma excision Left 1990    arm  . Breast biopsy  2009  . Neck surgery  10/2009    C5-6 fusion    FAMILY HISTORY The patient's father is in his mid 4s.  The patient's mother is also in her mid 57s.  She had a myocardial infarction remotely but is doing "okay" currently.  The patient has one sister in good health.  There is no history of breast or ovarian cancer in the family to her knowledge.  [name]  GYNECOLOGIC HISTORY: She is GX P2, first pregnancy to term age 68.  She went through the change of life more than 10 years ago and had been on hormones until her breast cancer diagnosis.    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  They have a  daughter, Alexis Goodell, who lives in [place] and has two children; a daughter, Kerri Perches, who lives in [place] and has one child.  They are members of [employer].   ADVANCED DIRECTIVES: in place  HEALTH MAINTENANCE: Social History  Substance Use Topics  . Smoking status: Never Smoker   . Smokeless tobacco: Never Used  . Alcohol Use: 0.0 oz/week     Comment: Socially     Colonoscopy: 2008  PAP: UTD/Neal  Bone density: osteopenia  Lipid panel:  Allergies  Allergen Reactions  . Codeine Nausea Only    Current Outpatient Prescriptions  Medication Sig Dispense Refill  . amitriptyline (ELAVIL) 50 MG tablet Take 50 mg by mouth at bedtime as needed for sleep.    [name] aspirin 81 MG tablet Take 81 mg by mouth daily.    . Biotin 5000 MCG CAPS Take 5,000 mcg by mouth daily.      . cholecalciferol (VITAMIN D) 1000 UNITS tablet Take 1,000 Units by mouth daily. Taking 3 Days a Week    . fish oil-omega-3 fatty acids 1000 MG capsule Take 2 g by mouth daily.      [name] gabapentin (NEURONTIN) 300 MG capsule Take 900 mg by mouth 2 (two) times daily.     [name] levothyroxine (SYNTHROID) 50 MCG tablet Take 1 tablet (50 mcg total) by mouth daily before breakfast.    . metoprolol tartrate (LOPRESSOR) 25 MG tablet TAKE 1 TABLET BY MOUTH TWICE DAILY. 180 tablet 0  . multivitamin (THERAGRAN) per tablet Take 1 tablet by mouth daily.      . nitrofurantoin (MACRODANTIN) 50 MG capsule Take 1 capsule (50 mg total) by mouth daily. 30 capsule 6  . tamoxifen (NOLVADEX) 20 MG tablet Take 1 tablet (20 mg total) by mouth daily. 90 tablet 3   No current facility-administered medications for this visit.    OBJECTIVE: Middle-aged white woman who appears well Filed Vitals:   11/13/15 1506  BP: 120/70  Pulse: 67  Temp: 98.7 F (37.1 C)  Resp: 18     Body mass index is 21.81 kg/(m^2).    ECOG FS: 0  Sclerae unicteric, EOMs intact Oropharynx clear, dentition in good repair No cervical or supraclavicular adenopathy Lungs  no rales or rhonchi Heart regular rate and rhythm Abd soft, nontender, positive bowel sounds MSK no focal spinal tenderness, no upper extremity lymphedema Neuro: nonfocal, well oriented, appropriate affect Breasts: The right breast is status post lumpectomy and radiation. There is no evidence of local recurrence. The right axilla is benign. Left breast is unremarkable.    LAB RESULTS: Lab Results  Component Value Date   WBC 4.5 11/06/2015   NEUTROABS 2.6 11/06/2015   HGB 12.8 11/06/2015   HCT 39.5 11/06/2015   MCV 90.2 11/06/2015   PLT 197 11/06/2015      Chemistry      Component Value Date/Time  NA 142 11/06/2015 1553   NA 141 10/05/2013 1639   K 3.9 11/06/2015 1553   K 3.4* 10/05/2013 1639   CL 104 10/05/2013 1639   CL 108* 02/01/2013 0953   CO2 27 11/06/2015 1553   CO2 31 10/05/2013 1639   BUN 11.8 11/06/2015 1553   BUN 11 10/05/2013 1639   CREATININE 0.8 11/06/2015 1553   CREATININE 0.8 10/05/2013 1639      Component Value Date/Time   CALCIUM 9.2 11/06/2015 1553   CALCIUM 9.5 10/05/2013 1639   ALKPHOS 71 11/06/2015 1553   ALKPHOS 68 08/04/2011 1416   AST 20 11/06/2015 1553   AST 24 08/04/2011 1416   ALT 15 11/06/2015 1553   ALT 20 08/04/2011 1416   BILITOT <0.30 11/06/2015 1553   BILITOT 0.4 08/04/2011 1416       Lab Results  Component Value Date   LABCA2 23 08/04/2011    No components found for: [id number]  No results for input(s): INR in the last 168 hours.  Urinalysis No results found for: COLORURINE  STUDIES: Mm Diag Breast Tomo Bilateral  10/22/2015  CLINICAL DATA:  History of right breast cancer, status post lumpectomy in 2009. Annual exam. The patient is asymptomatic. EXAM: DIGITAL DIAGNOSTIC BILATERAL MAMMOGRAM WITH 3D TOMOSYNTHESIS AND CAD COMPARISON:  Previous exam(s). ACR Breast Density Category c: The breast tissue is heterogeneously dense, which may obscure small masses. FINDINGS: There are postsurgical changes in the posterior third of  the inferior right breast. No mass, nonsurgical distortion, or suspicious microcalcifications identified in either breast to suggest malignancy. Mammographic images were processed with CAD. IMPRESSION: No evidence of malignancy in either breast. RECOMMENDATION: Diagnostic mammogram is suggested in 1 year. (Code:[id number]-01Y) I have discussed the findings and recommendations with the patient. Results were also provided in writing at the conclusion of the visit. If applicable, a reminder letter will be sent to the patient regarding the next appointment. BI-RADS CATEGORY  2: Benign. Electronically Signed   By: Curlene Dolphin M.D.   On: 10/22/2015 12:11     ASSESSMEN 71 y.o. [place] woman status post right lumpectomy and sentinel lymph node sampling October 2009 for a T1c N0, stage IA  invasive ductal carcinoma, grade 1, strongly estrogen and progesterone receptor positive, HER2 negative, with a proliferation fraction under 13.   (a) s/p adjuvant radiation  (b) started tamoxifen in February 2010.  When she started Cymbalta, we switched her to letrozole beginning January 2012. She then went off letrozole in May 2013, resuming tamoxifen August of 2013  PLAN: Georganna continues to do well as far as her breast cancer is concerned, now more than 7 years out from her definitive surgery without evidence of disease progression. This is very favorable.  She continues on anti-estrogens, specifically tamoxifen, and is tolerating that well. The plan is to do a total of 10 years, which is going to take [place] to February 2020. She has an excellent exercise regimen. I think if she did her morning stretching every day instead of 2 or 3 days a week some of the "creaking S" that she feels might get a little bit better.  As far as her dry mouth is concerned we discussed that she can make her own saline at home and swish and spit as frequently as she would like.  Otherwise she will see [place] again in one year. She knows to call for  any problems that may develop before that visit.    MAGRINAT,GUSTAV C    11/13/2015

## 2015-11-13 NOTE — Telephone Encounter (Signed)
Appointments made and avs printed °

## 2015-12-25 DIAGNOSIS — M542 Cervicalgia: Secondary | ICD-10-CM | POA: Diagnosis not present

## 2016-01-22 DIAGNOSIS — M542 Cervicalgia: Secondary | ICD-10-CM | POA: Diagnosis not present

## 2016-01-24 DIAGNOSIS — M4806 Spinal stenosis, lumbar region: Secondary | ICD-10-CM | POA: Diagnosis not present

## 2016-01-24 DIAGNOSIS — M542 Cervicalgia: Secondary | ICD-10-CM | POA: Diagnosis not present

## 2016-02-13 DIAGNOSIS — Z6820 Body mass index (BMI) 20.0-20.9, adult: Secondary | ICD-10-CM | POA: Diagnosis not present

## 2016-02-13 DIAGNOSIS — R600 Localized edema: Secondary | ICD-10-CM | POA: Diagnosis not present

## 2016-02-13 DIAGNOSIS — I872 Venous insufficiency (chronic) (peripheral): Secondary | ICD-10-CM | POA: Diagnosis not present

## 2016-02-13 DIAGNOSIS — I781 Nevus, non-neoplastic: Secondary | ICD-10-CM | POA: Diagnosis not present

## 2016-02-15 ENCOUNTER — Other Ambulatory Visit (HOSPITAL_COMMUNITY): Payer: Self-pay | Admitting: Internal Medicine

## 2016-02-15 ENCOUNTER — Ambulatory Visit (HOSPITAL_COMMUNITY)
Admission: RE | Admit: 2016-02-15 | Discharge: 2016-02-15 | Disposition: A | Discharge status: Home / Self Care | Payer: PPO | Source: Ambulatory Visit | Attending: Vascular Surgery | Admitting: Vascular Surgery

## 2016-02-15 DIAGNOSIS — R600 Localized edema: Secondary | ICD-10-CM | POA: Insufficient documentation

## 2016-02-15 DIAGNOSIS — I781 Nevus, non-neoplastic: Secondary | ICD-10-CM | POA: Diagnosis not present

## 2016-02-15 DIAGNOSIS — M542 Cervicalgia: Secondary | ICD-10-CM | POA: Diagnosis not present

## 2016-03-19 ENCOUNTER — Encounter: Payer: Self-pay | Admitting: Vascular Surgery

## 2016-03-25 ENCOUNTER — Encounter: Payer: Self-pay | Admitting: Vascular Surgery

## 2016-03-25 ENCOUNTER — Ambulatory Visit (INDEPENDENT_AMBULATORY_CARE_PROVIDER_SITE_OTHER): Payer: PPO | Admitting: Vascular Surgery

## 2016-03-25 VITALS — BP 117/74 | HR 64 | Temp 97.8°F | Resp 16 | Ht 67.5 in | Wt 136.0 lb

## 2016-03-25 DIAGNOSIS — I83891 Varicose veins of right lower extremities with other complications: Secondary | ICD-10-CM | POA: Insufficient documentation

## 2016-03-25 HISTORY — DX: Varicose veins of right lower extremity with other complications: I83.891

## 2016-03-25 NOTE — Progress Notes (Signed)
Subjective:     Patient ID: Kellie Humphrey, female   DOB: 10-20-1945, 70 y.o.   MRN: [id number]  HPI this 70 year old female was referred by Dr. Lang Snow for evaluation of painful varicosities and swelling in both legs right worse than left. She has had increasing discomfort particularly in the right leg over the last 6-12 months with aching throbbing and burning discomfort. She develops swelling in the right ankle as the day progresses. She has no history of DVT thrombophlebitis stasis ulcers or bleeding. She does have some long leg elastic compression stockings which she has worn intermittently in the past and are 20/30 compression. She does not elevate her legs a regular basis.  Past Medical History  Diagnosis Date  . Breast cancer ([place])     s/p lumpectomy  . MVP (mitral valve prolapse)   . PAC (premature atrial contraction)   . Palpitations     Social History  Substance Use Topics  . Smoking status: Never Smoker   . Smokeless tobacco: Never Used  . Alcohol Use: 0.0 oz/week     Comment: Socially    Family History  Problem Relation Age of Onset  . Coronary artery disease Mother   . Hypertension Mother   . Osteoporosis Mother     wrist fracture  . Osteoarthritis Father   . Neuropathy Father   . Tremor Father   . Other Father     lymphedema  . Fibromyalgia Sister   . Heart disease Maternal Grandmother     Allergies  Allergen Reactions  . Codeine Nausea Only     Current outpatient prescriptions:  .  amitriptyline (ELAVIL) 50 MG tablet, Take 50 mg by mouth at bedtime as needed for sleep., Disp: , Rfl:  .  aspirin 81 MG tablet, Take 81 mg by mouth daily., Disp: , Rfl:  .  Biotin 5000 MCG CAPS, Take 5,000 mcg by mouth daily.  , Disp: , Rfl:  .  cholecalciferol (VITAMIN D) 1000 UNITS tablet, Take 1,000 Units by mouth daily. Taking 3 Days a Week, Disp: , Rfl:  .  fish oil-omega-3 fatty acids 1000 MG capsule, Take 2 g by mouth daily.  , Disp: , Rfl:  .  gabapentin  (NEURONTIN) 300 MG capsule, Take 900 mg by mouth 2 (two) times daily. , Disp: , Rfl:  .  levothyroxine (SYNTHROID) 50 MCG tablet, Take 1 tablet (50 mcg total) by mouth daily before breakfast., Disp: , Rfl:  .  metoprolol tartrate (LOPRESSOR) 25 MG tablet, TAKE 1 TABLET BY MOUTH TWICE DAILY., Disp: 180 tablet, Rfl: 0 .  nitrofurantoin (MACRODANTIN) 50 MG capsule, Take 1 capsule (50 mg total) by mouth daily., Disp: 30 capsule, Rfl: 6 .  tamoxifen (NOLVADEX) 20 MG tablet, Take 1 tablet (20 mg total) by mouth daily., Disp: 90 tablet, Rfl: 3 .  multivitamin (THERAGRAN) per tablet, Take 1 tablet by mouth daily. Reported on 03/25/2016, Disp: , Rfl:   Filed Vitals:   03/25/16 1257  BP: 117/74  Pulse: 64  Temp: 97.8 F (36.6 C)  Resp: 16  Height: 5' 7.5" (1.715 m)  Weight: 136 lb (61.689 kg)  SpO2: 100%    Body mass index is 20.97 kg/(m^2).           Review of Systems denies chest pain, dyspnea on exertion, PND, orthopnea, has history of breast cancer with lumpectomy in the past. Has had palpitations and mitral valve prolapse in the past. All other systems negative and complete review of systems  Objective:   Physical Exam BP 117/74 mmHg  Pulse 64  Temp(Src) 97.8 F (36.6 C)  Resp 16  Ht 5' 7.5" (1.715 m)  Wt 136 lb (61.689 kg)  BMI 20.97 kg/m2  SpO2 100%    Gen.-alert and oriented x3 in no apparent distress HEENT normal for age Lungs no rhonchi or wheezing Cardiovascular regular rhythm no murmurs carotid pulses 3+ palpable no bruits audible Abdomen soft nontender no palpable masses Musculoskeletal free of  major deformities Skin clear -no rashes Neurologic normal Lower extremities 3+ femoral and dorsalis pedis pulses palpable bilaterally with 1+ edema on the right no edema on the left Extensive reticular and spider veins lower third of leg extending down onto the dorsum of the foot between the medial and lateral malleolus with no active ulceration.  Today I ordered  bilateral venous duplex exam which I reviewed and interpreted. There is no DVT. The right great saphenous vein is of large caliber and has gross reflux throughout. The left great saphenous vein is of much smaller caliber. There is reflux at the junction and in the left small saphenous vein.  I performed an independent sono site exam today-B-mode ultrasound and confirm that there is a large caliber right great saphenous vein with gross reflux throughout up through the junction-saphenofemoral     Assessment:     Painful varicosities right leg with chronic edema due to gross reflux right great saphenous vein causing symptoms which are affecting patient's daily living    Plan:         #1 long leg elastic compression stockings 20-30 mm gradient #2 elevate legs as much as possible #3 ibuprofen daily on a regular basis for pain #4 return in 3 months-if no significant improvement then she will need #1 laser ablation right great saphenous vein followed by three-month waiting period and probable sclerotherapy to complete her treatment regimen Return in 3 months

## 2016-04-07 ENCOUNTER — Other Ambulatory Visit (HOSPITAL_COMMUNITY): Payer: Self-pay | Admitting: Adult Health

## 2016-06-06 DIAGNOSIS — H43393 Other vitreous opacities, bilateral: Secondary | ICD-10-CM | POA: Diagnosis not present

## 2016-06-06 DIAGNOSIS — H52223 Regular astigmatism, bilateral: Secondary | ICD-10-CM | POA: Diagnosis not present

## 2016-06-06 DIAGNOSIS — H5213 Myopia, bilateral: Secondary | ICD-10-CM | POA: Diagnosis not present

## 2016-06-06 DIAGNOSIS — H25819 Combined forms of age-related cataract, unspecified eye: Secondary | ICD-10-CM | POA: Diagnosis not present

## 2016-06-14 DIAGNOSIS — Z23 Encounter for immunization: Secondary | ICD-10-CM | POA: Diagnosis not present

## 2016-07-01 ENCOUNTER — Ambulatory Visit: Payer: PPO | Admitting: Vascular Surgery

## 2016-07-18 ENCOUNTER — Encounter: Payer: Self-pay | Admitting: Vascular Surgery

## 2016-07-22 ENCOUNTER — Ambulatory Visit (INDEPENDENT_AMBULATORY_CARE_PROVIDER_SITE_OTHER): Payer: PPO | Admitting: Vascular Surgery

## 2016-07-22 ENCOUNTER — Encounter: Payer: Self-pay | Admitting: Vascular Surgery

## 2016-07-22 VITALS — BP 104/72 | HR 81 | Temp 97.4°F | Resp 16 | Ht 67.0 in | Wt 135.0 lb

## 2016-07-22 DIAGNOSIS — I83891 Varicose veins of right lower extremities with other complications: Secondary | ICD-10-CM

## 2016-07-22 NOTE — Progress Notes (Signed)
Subjective:     Patient ID: Kellie Humphrey, female   DOB: 1946-07-18, 70 y.o.   MRN: [id number]  HPI This 70 year old female returns for 3 month follow-up regarding her pain and swelling in the right lower extremity. She has tried long leg elastic compression stockings 20-30 millimeter gradient as well as elevation and ibuprofen but continues to have aching and throbbing discomfort as well as itching in the lower third of the leg which worsens as the day progresses. She has no history of DVT thrombophlebitis stasis ulcers bleeding. She has no significant symptoms in the left leg. She feels that the symptoms have worsened over the past 6 months and are affecting her daily living.  Past Medical History:  Diagnosis Date  . Breast cancer ([place])    s/p lumpectomy  . MVP (mitral valve prolapse)   . PAC (premature atrial contraction)   . Palpitations     Social History  Substance Use Topics  . Smoking status: Never Smoker  . Smokeless tobacco: Never Used  . Alcohol use 0.0 oz/week     Comment: Socially    Family History  Problem Relation Age of Onset  . Coronary artery disease Mother   . Hypertension Mother   . Osteoporosis Mother     wrist fracture  . Osteoarthritis Father   . Neuropathy Father   . Tremor Father   . Other Father     lymphedema  . Fibromyalgia Sister   . Heart disease Maternal Grandmother     Allergies  Allergen Reactions  . Codeine Nausea Only and Nausea And Vomiting     Current Outpatient Prescriptions:  .  amitriptyline (ELAVIL) 50 MG tablet, Take 50 mg by mouth at bedtime as needed for sleep., Disp: , Rfl:  .  aspirin 81 MG tablet, Take 81 mg by mouth daily., Disp: , Rfl:  .  Biotin 5000 MCG CAPS, Take 5,000 mcg by mouth daily.  , Disp: , Rfl:  .  cholecalciferol (VITAMIN D) 1000 UNITS tablet, Take 1,000 Units by mouth daily. Taking 3 Days a Week, Disp: , Rfl:  .  fish oil-omega-3 fatty acids 1000 MG capsule, Take 2 g by mouth daily.  , Disp: , Rfl:  .   gabapentin (NEURONTIN) 300 MG capsule, Take 900 mg by mouth 2 (two) times daily. , Disp: , Rfl:  .  levothyroxine (SYNTHROID) 50 MCG tablet, Take 1 tablet (50 mcg total) by mouth daily before breakfast., Disp: , Rfl:  .  metoprolol tartrate (LOPRESSOR) 25 MG tablet, TAKE 1 TABLET BY MOUTH TWICE DAILY., Disp: 180 tablet, Rfl: 3 .  nitrofurantoin (MACRODANTIN) 50 MG capsule, Take 1 capsule (50 mg total) by mouth daily., Disp: 30 capsule, Rfl: 6 .  tamoxifen (NOLVADEX) 20 MG tablet, Take 1 tablet (20 mg total) by mouth daily., Disp: 90 tablet, Rfl: 3 .  multivitamin (THERAGRAN) per tablet, Take 1 tablet by mouth daily. Reported on 03/25/2016, Disp: , Rfl:   Vitals:   07/22/16 1441  BP: 104/72  Pulse: 81  Resp: 16  Temp: 97.4 F (36.3 C)  SpO2: 96%  Weight: 135 lb (61.2 kg)  Height: 5\' 7"  (1.702 m)    Body mass index is 21.14 kg/m.         Review of Systems Denies chest pain, history of coronary artery disease, hemoptysis, claudication.    Objective:   Physical Exam BP 104/72 (BP Location: Left Arm, Patient Position: Sitting, Cuff Size: Normal)   Pulse 81   Temp 97.4  F (36.3 C)   Resp 16   Ht 5\' 7"  (1.702 m)   Wt 135 lb (61.2 kg)   SpO2 96%   BMI 21.14 kg/m   Gen. well-developed well-nourished female no apparent distress alert and oriented 3 Lungs no rhonchi or wheezing Right leg with extensive network of reticular veins lower third right leg with 1+ edema. 3+ dorsalis pedis pulse palpable.  At last visit patient had formal venous duplex exam which revealed gross reflux throughout the right great saphenous vein with no evidence of DVT. Right great saphenous vein was enlarged.     Assessment:     Pain and swelling right leg due to gross reflux right great saphenous vein which is not responding to conservative measures including long-leg elastic compression stockings, elevation, and ibuprofen. Symptoms are affecting her daily living.    Plan:     Would recommend  proceeding with laser ablation right great saphenous vein and then have patient return in 3 months to see if sclerotherapy would be indicated for residual reticular veins We'll proceed with precertification to perform this in the near future and hopefully relieve her symptoms

## 2016-07-23 DIAGNOSIS — N390 Urinary tract infection, site not specified: Secondary | ICD-10-CM | POA: Diagnosis not present

## 2016-07-23 DIAGNOSIS — R8299 Other abnormal findings in urine: Secondary | ICD-10-CM | POA: Diagnosis not present

## 2016-07-23 DIAGNOSIS — E038 Other specified hypothyroidism: Secondary | ICD-10-CM | POA: Diagnosis not present

## 2016-07-23 DIAGNOSIS — M859 Disorder of bone density and structure, unspecified: Secondary | ICD-10-CM | POA: Diagnosis not present

## 2016-07-30 DIAGNOSIS — Z1389 Encounter for screening for other disorder: Secondary | ICD-10-CM | POA: Diagnosis not present

## 2016-07-30 DIAGNOSIS — Z6821 Body mass index (BMI) 21.0-21.9, adult: Secondary | ICD-10-CM | POA: Diagnosis not present

## 2016-07-30 DIAGNOSIS — G250 Essential tremor: Secondary | ICD-10-CM | POA: Diagnosis not present

## 2016-07-30 DIAGNOSIS — F419 Anxiety disorder, unspecified: Secondary | ICD-10-CM | POA: Diagnosis not present

## 2016-07-30 DIAGNOSIS — R002 Palpitations: Secondary | ICD-10-CM | POA: Diagnosis not present

## 2016-07-30 DIAGNOSIS — N3010 Interstitial cystitis (chronic) without hematuria: Secondary | ICD-10-CM | POA: Diagnosis not present

## 2016-07-30 DIAGNOSIS — M5412 Radiculopathy, cervical region: Secondary | ICD-10-CM | POA: Diagnosis not present

## 2016-07-30 DIAGNOSIS — I781 Nevus, non-neoplastic: Secondary | ICD-10-CM | POA: Diagnosis not present

## 2016-07-30 DIAGNOSIS — M859 Disorder of bone density and structure, unspecified: Secondary | ICD-10-CM | POA: Diagnosis not present

## 2016-07-30 DIAGNOSIS — C50919 Malignant neoplasm of unspecified site of unspecified female breast: Secondary | ICD-10-CM | POA: Diagnosis not present

## 2016-07-30 DIAGNOSIS — E038 Other specified hypothyroidism: Secondary | ICD-10-CM | POA: Diagnosis not present

## 2016-07-30 DIAGNOSIS — Z0000 Encounter for general adult medical examination without abnormal findings: Secondary | ICD-10-CM | POA: Diagnosis not present

## 2016-08-04 ENCOUNTER — Other Ambulatory Visit: Payer: Self-pay | Admitting: *Deleted

## 2016-08-04 DIAGNOSIS — I83811 Varicose veins of right lower extremities with pain: Secondary | ICD-10-CM

## 2016-08-27 ENCOUNTER — Encounter: Payer: Self-pay | Admitting: Vascular Surgery

## 2016-08-29 ENCOUNTER — Encounter: Payer: Self-pay | Admitting: Vascular Surgery

## 2016-09-01 ENCOUNTER — Ambulatory Visit (INDEPENDENT_AMBULATORY_CARE_PROVIDER_SITE_OTHER): Payer: PPO | Admitting: Vascular Surgery

## 2016-09-01 VITALS — BP 97/70 | HR 75 | Resp 14 | Ht 67.0 in | Wt 135.0 lb

## 2016-09-01 DIAGNOSIS — I83891 Varicose veins of right lower extremities with other complications: Secondary | ICD-10-CM | POA: Diagnosis not present

## 2016-09-01 NOTE — Progress Notes (Signed)
Subjective:     Patient ID: Kellie Humphrey, female   DOB: 04/24/1946, 70 y.o.   MRN: [id number]  HPI  This 70 year old female had laser ablation right great saphenous vein from the proximal calf to near the saphenofemoral junction performed under local tumescent anesthesia. A total of 2100 J of energy was utilized. She tolerated the procedure well. Review of Systems     Objective:   Physical Exam BP 97/70 (BP Location: Left Arm, Patient Position: Sitting, Cuff Size: Normal)   Pulse 75   Resp 14   Ht 5\' 7"  (1.702 m)   Wt 135 lb (61.2 kg)   SpO2 100%   BMI 21.14 kg/m        Assessment:     Well-tolerated laser ablation right great saphenous vein performed under local tumescent anesthesia    Plan:     Return in 1 week for venous duplex exam to confirm closure right great saphenous vein

## 2016-09-01 NOTE — Progress Notes (Signed)
Laser Ablation Procedure    Date: 09/01/2016   Kellie Humphrey DOB:June 28, 1946  Consent signed: Yes    Surgeon:  Dr. Nelda Severe. Kellie Simmering  Procedure: Laser Ablation: right Greater Saphenous Vein  BP 97/70 (BP Location: Left Arm, Patient Position: Sitting, Cuff Size: Normal)   Pulse 75   Resp 14   Ht 5\' 7"  (1.702 m)   Wt 135 lb (61.2 kg)   SpO2 100%   BMI 21.14 kg/m   Tumescent Anesthesia: 350 cc 0.9% NaCl with 50 cc Lidocaine HCL with 1% Epi and 15 cc 8.4% NaHCO3  Local Anesthesia: 4 cc Lidocaine HCL and NaHCO3 (ratio 2:1)  Pulsed Mode: 15 watts, 570ms delay, 1.0 duration  Total Energy: 2140             Total Pulses: 143               Total Time: 2:23    Patient tolerated procedure well  Notes:   Description of Procedure:  After marking the course of the secondary varicosities, the patient was placed on the operating table in the supine position, and the right leg was prepped and draped in sterile fashion.   Local anesthetic was administered and under ultrasound guidance the saphenous vein was accessed with a micro needle and guide wire; then the mirco puncture sheath was placed.  A guide wire was inserted saphenofemoral junction , followed by a 5 french sheath.  The position of the sheath and then the laser fiber below the junction was confirmed using the ultrasound.  Tumescent anesthesia was administered along the course of the saphenous vein using ultrasound guidance. The patient was placed in Trendelenburg position and protective laser glasses were placed on patient and staff, and the laser was fired at 15 watts continuous mode advancing 1-43mm/second for a total of 2140 joules.     Steri strips were applied to the stab wounds and ABD pads and thigh high compression stockings were applied.  Ace wrap bandages were applied over the phlebectomy sites and at the top of the saphenofemoral junction. Blood loss was less than 15 cc.  The patient ambulated out of the operating room having  tolerated the procedure well.

## 2016-09-03 ENCOUNTER — Encounter: Payer: Self-pay | Admitting: Vascular Surgery

## 2016-09-08 ENCOUNTER — Encounter: Payer: Self-pay | Admitting: Vascular Surgery

## 2016-09-09 ENCOUNTER — Encounter: Payer: Self-pay | Admitting: Vascular Surgery

## 2016-09-09 ENCOUNTER — Ambulatory Visit (HOSPITAL_COMMUNITY)
Admission: RE | Admit: 2016-09-09 | Discharge: 2016-09-09 | Disposition: A | Discharge status: Home / Self Care | Payer: PPO | Source: Ambulatory Visit | Attending: Vascular Surgery | Admitting: Vascular Surgery

## 2016-09-09 ENCOUNTER — Ambulatory Visit (INDEPENDENT_AMBULATORY_CARE_PROVIDER_SITE_OTHER): Payer: PPO | Admitting: Vascular Surgery

## 2016-09-09 VITALS — BP 101/67 | HR 73 | Temp 98.0°F | Resp 18 | Ht 67.0 in | Wt 139.3 lb

## 2016-09-09 DIAGNOSIS — I83811 Varicose veins of right lower extremities with pain: Secondary | ICD-10-CM | POA: Diagnosis not present

## 2016-09-09 DIAGNOSIS — I83891 Varicose veins of right lower extremities with other complications: Secondary | ICD-10-CM

## 2016-09-09 DIAGNOSIS — Z9889 Other specified postprocedural states: Secondary | ICD-10-CM | POA: Insufficient documentation

## 2016-09-09 NOTE — Progress Notes (Signed)
Subjective:     Patient ID: Kellie Humphrey, female   DOB: [birth year], 70 y.o.   MRN: [id number]  HPI This 70 year old female returns 1 week post-laser ablation right great saphenous vein from distal thigh to near the saphenofemoral junction performed for reflux with venous hypertension and painful varicosities. She has had minimal discomfort in the thigh. She has worn her long leg elastic compression stockings 20-30 millimeter gradient as instructed and taken ibuprofen for pain.. She denies any significant distal edema in the right ankle.  Past Medical History:  Diagnosis Date  . Breast cancer ([place])    s/p lumpectomy  . MVP (mitral valve prolapse)   . PAC (premature atrial contraction)   . Palpitations     Social History  Substance Use Topics  . Smoking status: Never Smoker  . Smokeless tobacco: Never Used  . Alcohol use 0.0 oz/week     Comment: Socially    Family History  Problem Relation Age of Onset  . Coronary artery disease Mother   . Hypertension Mother   . Osteoporosis Mother     wrist fracture  . Osteoarthritis Father   . Neuropathy Father   . Tremor Father   . Other Father     lymphedema  . Fibromyalgia Sister   . Heart disease Maternal Grandmother     Allergies  Allergen Reactions  . Codeine Nausea Only and Nausea And Vomiting     Current Outpatient Prescriptions:  .  amitriptyline (ELAVIL) 50 MG tablet, Take 50 mg by mouth at bedtime as needed for sleep., Disp: , Rfl:  .  aspirin 81 MG tablet, Take 81 mg by mouth daily., Disp: , Rfl:  .  Biotin 5000 MCG CAPS, Take 5,000 mcg by mouth daily.  , Disp: , Rfl:  .  cholecalciferol (VITAMIN D) 1000 UNITS tablet, Take 1,000 Units by mouth daily. Taking 3 Days a Week, Disp: , Rfl:  .  fish oil-omega-3 fatty acids 1000 MG capsule, Take 2 g by mouth daily.  , Disp: , Rfl:  .  gabapentin (NEURONTIN) 300 MG capsule, Take 900 mg by mouth 2 (two) times daily. , Disp: , Rfl:  .  levothyroxine (SYNTHROID) 50 MCG tablet,  Take 1 tablet (50 mcg total) by mouth daily before breakfast., Disp: , Rfl:  .  metoprolol tartrate (LOPRESSOR) 25 MG tablet, TAKE 1 TABLET BY MOUTH TWICE DAILY., Disp: 180 tablet, Rfl: 3 .  multivitamin (THERAGRAN) per tablet, Take 1 tablet by mouth daily. Reported on 03/25/2016, Disp: , Rfl:  .  nitrofurantoin (MACRODANTIN) 50 MG capsule, Take 1 capsule (50 mg total) by mouth daily., Disp: 30 capsule, Rfl: 6 .  tamoxifen (NOLVADEX) 20 MG tablet, Take 1 tablet (20 mg total) by mouth daily., Disp: 90 tablet, Rfl: 3  Vitals:   09/09/16 1026  BP: 101/67  Pulse: 73  Resp: 18  Temp: 98 F (36.7 C)  TempSrc: Oral  SpO2: 100%  Weight: 139 lb 4.8 oz (63.2 kg)  Height: 5\' 7"  (1.702 m)    Body mass index is 21.82 kg/m.         Review of Systems Eyes chest pain, dyspnea on exertion, PND, orthopnea, hemoptysis, claudication    Objective:   Physical Exam BP 101/67 (BP Location: Left Arm, Patient Position: Sitting, Cuff Size: Normal)   Pulse 73   Temp 98 F (36.7 C) (Oral)   Resp 18   Ht 5\' 7"  (1.702 m)   Wt 139 lb 4.8 oz (63.2 kg)  SpO2 100%   BMI 21.82 kg/m   Gen. well-developed well-nourished female no apparent distress alert and oriented 3 Lungs no rhonchi or wheezing Right leg with mild discomfort to deep palpation over great saphenous vein in mid to proximal thigh. No ecchymosis noted. No distal edema noted. Extensive reticular network lower third right leg near malleolus on the medial side. 3+ dorsalis pedis pulse palpable.  Today I ordered a venous duplex exam the right leg which I reviewed and interpreted. There is no DVT. There is total closure of the right great saphenous vein from the distal thigh to near the saphenofemoral junction     Assessment:     #1 successful laser ablation right great saphenous vein for gross reflux with painful varicosities    Plan:     Return in 3 months for further evaluation regarding need for possible foam sclerotherapy for  painful reticular veins

## 2016-09-16 ENCOUNTER — Other Ambulatory Visit: Payer: Self-pay | Admitting: Obstetrics & Gynecology

## 2016-09-16 DIAGNOSIS — Z853 Personal history of malignant neoplasm of breast: Secondary | ICD-10-CM

## 2016-10-08 DIAGNOSIS — E038 Other specified hypothyroidism: Secondary | ICD-10-CM | POA: Diagnosis not present

## 2016-10-20 DIAGNOSIS — Z01419 Encounter for gynecological examination (general) (routine) without abnormal findings: Secondary | ICD-10-CM | POA: Diagnosis not present

## 2016-10-20 DIAGNOSIS — Z6821 Body mass index (BMI) 21.0-21.9, adult: Secondary | ICD-10-CM | POA: Diagnosis not present

## 2016-10-22 ENCOUNTER — Ambulatory Visit
Admission: RE | Admit: 2016-10-22 | Discharge: 2016-10-22 | Disposition: A | Discharge status: Home / Self Care | Payer: PPO | Source: Ambulatory Visit | Attending: Obstetrics & Gynecology | Admitting: Obstetrics & Gynecology

## 2016-10-22 DIAGNOSIS — R922 Inconclusive mammogram: Secondary | ICD-10-CM | POA: Diagnosis not present

## 2016-10-22 DIAGNOSIS — Z853 Personal history of malignant neoplasm of breast: Secondary | ICD-10-CM

## 2016-11-05 ENCOUNTER — Telehealth: Payer: Self-pay | Admitting: Oncology

## 2016-11-05 NOTE — Telephone Encounter (Signed)
Called to confirm appointment changes and patient said she was aware of them though MyChart

## 2016-11-12 ENCOUNTER — Other Ambulatory Visit: Payer: PPO

## 2016-11-12 ENCOUNTER — Ambulatory Visit: Payer: PPO | Admitting: Oncology

## 2016-11-28 ENCOUNTER — Encounter: Payer: Self-pay | Admitting: Vascular Surgery

## 2016-11-28 DIAGNOSIS — H52223 Regular astigmatism, bilateral: Secondary | ICD-10-CM | POA: Diagnosis not present

## 2016-11-28 DIAGNOSIS — H25813 Combined forms of age-related cataract, bilateral: Secondary | ICD-10-CM | POA: Diagnosis not present

## 2016-11-28 DIAGNOSIS — H5213 Myopia, bilateral: Secondary | ICD-10-CM | POA: Diagnosis not present

## 2016-11-28 DIAGNOSIS — H524 Presbyopia: Secondary | ICD-10-CM | POA: Diagnosis not present

## 2016-12-09 ENCOUNTER — Encounter: Payer: Self-pay | Admitting: Vascular Surgery

## 2016-12-09 ENCOUNTER — Other Ambulatory Visit: Payer: Self-pay | Admitting: Adult Health

## 2016-12-09 ENCOUNTER — Ambulatory Visit (INDEPENDENT_AMBULATORY_CARE_PROVIDER_SITE_OTHER): Payer: PPO | Admitting: Vascular Surgery

## 2016-12-09 VITALS — BP 100/68 | HR 73 | Temp 98.1°F | Resp 16 | Ht 65.0 in | Wt 136.0 lb

## 2016-12-09 DIAGNOSIS — I83891 Varicose veins of right lower extremities with other complications: Secondary | ICD-10-CM

## 2016-12-09 DIAGNOSIS — Z170 Estrogen receptor positive status [ER+]: Principal | ICD-10-CM

## 2016-12-09 DIAGNOSIS — C50511 Malignant neoplasm of lower-outer quadrant of right female breast: Secondary | ICD-10-CM

## 2016-12-09 NOTE — Progress Notes (Signed)
Subjective:     Patient ID: Kellie Humphrey, female   DOB: 1946-01-31, 71 y.o.   MRN: [id number]  HPI This 71 year old female returns 3 months post-laser ablation right great saphenous vein for gross reflux with pain and swelling. She had a successful ablation of the right great saphenous vein documented by previous ultrasound. She states that the slight swelling she was experiencing previously is now resolved. She is having some aching discomfort in the calf as the day progresses. She is not wearing elastic compression stockings at this time.  Past Medical History:  Diagnosis Date  . Breast cancer ([place])    s/p lumpectomy  . MVP (mitral valve prolapse)   . PAC (premature atrial contraction)   . Palpitations     Social History  Substance Use Topics  . Smoking status: Never Smoker  . Smokeless tobacco: Never Used  . Alcohol use 0.0 oz/week     Comment: Socially    Family History  Problem Relation Age of Onset  . Coronary artery disease Mother   . Hypertension Mother   . Osteoporosis Mother     wrist fracture  . Osteoarthritis Father   . Neuropathy Father   . Tremor Father   . Other Father     lymphedema  . Fibromyalgia Sister   . Heart disease Maternal Grandmother     Allergies  Allergen Reactions  . Codeine Nausea Only and Nausea And Vomiting     Current Outpatient Prescriptions:  .  amitriptyline (ELAVIL) 50 MG tablet, Take 50 mg by mouth at bedtime as needed for sleep., Disp: , Rfl:  .  aspirin 81 MG tablet, Take 81 mg by mouth daily., Disp: , Rfl:  .  Biotin 5000 MCG CAPS, Take 5,000 mcg by mouth daily.  , Disp: , Rfl:  .  cholecalciferol (VITAMIN D) 1000 UNITS tablet, Take 1,000 Units by mouth daily. Taking 3 Days a Week, Disp: , Rfl:  .  fish oil-omega-3 fatty acids 1000 MG capsule, Take 2 g by mouth daily.  , Disp: , Rfl:  .  gabapentin (NEURONTIN) 300 MG capsule, Take 900 mg by mouth 2 (two) times daily. , Disp: , Rfl:  .  levothyroxine (SYNTHROID) 50 MCG  tablet, Take 1 tablet (50 mcg total) by mouth daily before breakfast., Disp: , Rfl:  .  metoprolol tartrate (LOPRESSOR) 25 MG tablet, TAKE 1 TABLET BY MOUTH TWICE DAILY., Disp: 180 tablet, Rfl: 3 .  nitrofurantoin (MACRODANTIN) 50 MG capsule, Take 1 capsule (50 mg total) by mouth daily., Disp: 30 capsule, Rfl: 6 .  tamoxifen (NOLVADEX) 20 MG tablet, Take 1 tablet (20 mg total) by mouth daily., Disp: 90 tablet, Rfl: 3 .  multivitamin (THERAGRAN) per tablet, Take 1 tablet by mouth daily. Reported on 03/25/2016, Disp: , Rfl:   Vitals:   12/09/16 1124  BP: 100/68  Pulse: 73  Resp: 16  Temp: 98.1 F (36.7 C)  SpO2: 100%  Weight: 136 lb (61.7 kg)  Height: 5\' 5"  (1.651 m)    Body mass index is 22.63 kg/m.         Review of Systems As chest pain, dyspnea on exertion, PND, orthopnea, hemoptysis, claudication    Objective:   Physical Exam BP 100/68 (BP Location: Left Arm, Patient Position: Sitting, Cuff Size: Normal)   Pulse 73   Temp 98.1 F (36.7 C)   Resp 16   Ht 5\' 5"  (1.651 m)   Wt 136 lb (61.7 kg)   SpO2 100%  BMI 22.63 kg/m   Gen. well-developed well-nourished female no apparent distress alert and oriented 3 Lungs no rhonchi or wheezing Cardiovascular regular rhythm no murmurs Right leg with no distal edema appreciated. 2+ posterior tibial pulse palpable. Collection of reticular and spider veins lower third left leg around left medial malleolar area with no active ulceration. One cluster of spider veins in distal left medial thigh.     Assessment:     Successful laser ablation right great saphenous vein with residual reticular veins and right ankle    Plan:     Believe patient should have 2 units of foam sclerotherapy to complete her treatment regimen and relieve her symptoms.

## 2016-12-10 ENCOUNTER — Ambulatory Visit (HOSPITAL_BASED_OUTPATIENT_CLINIC_OR_DEPARTMENT_OTHER): Payer: PPO | Admitting: Oncology

## 2016-12-10 ENCOUNTER — Other Ambulatory Visit (HOSPITAL_BASED_OUTPATIENT_CLINIC_OR_DEPARTMENT_OTHER): Payer: PPO

## 2016-12-10 VITALS — BP 96/62 | HR 76 | Temp 97.8°F | Resp 18 | Ht 65.0 in | Wt 136.7 lb

## 2016-12-10 DIAGNOSIS — Z853 Personal history of malignant neoplasm of breast: Secondary | ICD-10-CM

## 2016-12-10 DIAGNOSIS — Z79810 Long term (current) use of selective estrogen receptor modulators (SERMs): Secondary | ICD-10-CM

## 2016-12-10 DIAGNOSIS — C50511 Malignant neoplasm of lower-outer quadrant of right female breast: Secondary | ICD-10-CM

## 2016-12-10 DIAGNOSIS — Z170 Estrogen receptor positive status [ER+]: Principal | ICD-10-CM

## 2016-12-10 LAB — COMPREHENSIVE METABOLIC PANEL
ALBUMIN: 3.7 g/dL (ref 3.5–5.0)
ALK PHOS: 64 U/L (ref 40–150)
ALT: 10 U/L (ref 0–55)
ANION GAP: 8 meq/L (ref 3–11)
AST: 20 U/L (ref 5–34)
BILIRUBIN TOTAL: 0.34 mg/dL (ref 0.20–1.20)
BUN: 8.6 mg/dL (ref 7.0–26.0)
CALCIUM: 9 mg/dL (ref 8.4–10.4)
CHLORIDE: 107 meq/L (ref 98–109)
CO2: 28 mEq/L (ref 22–29)
CREATININE: 0.8 mg/dL (ref 0.6–1.1)
EGFR: 71 mL/min/{1.73_m2} — ABNORMAL LOW (ref >90)
Glucose: 84 mg/dl (ref 70–140)
Potassium: 4.1 mEq/L (ref 3.5–5.1)
Sodium: 143 mEq/L (ref 136–145)
TOTAL PROTEIN: 6.8 g/dL (ref 6.4–8.3)

## 2016-12-10 LAB — CBC WITH DIFFERENTIAL/PLATELET
BASO%: 0.5 % (ref 0.0–2.0)
Basophils Absolute: 0 10*3/uL (ref 0.0–0.1)
EOS%: 5.3 % (ref 0.0–7.0)
Eosinophils Absolute: 0.2 10*3/uL (ref 0.0–0.5)
HEMATOCRIT: 38.1 % (ref 34.8–46.6)
HEMOGLOBIN: 12.7 g/dL (ref 11.6–15.9)
LYMPH#: 1 10*3/uL (ref 0.9–3.3)
LYMPH%: 24.8 % (ref 14.0–49.7)
MCH: 29.8 pg (ref 25.1–34.0)
MCHC: 33.3 g/dL (ref 31.5–36.0)
MCV: 89.4 fL (ref 79.5–101.0)
MONO#: 0.4 10*3/uL (ref 0.1–0.9)
MONO%: 10 % (ref 0.0–14.0)
NEUT#: 2.5 10*3/uL (ref 1.5–6.5)
NEUT%: 59.4 % (ref 38.4–76.8)
PLATELETS: 196 10*3/uL (ref 145–400)
RBC: 4.26 10*6/uL (ref 3.70–5.45)
RDW: 13.8 % (ref 11.2–14.5)
WBC: 4.2 10*3/uL (ref 3.9–10.3)

## 2016-12-10 MED ORDER — TAMOXIFEN CITRATE 20 MG PO TABS: 20.0000 mg | ORAL_TABLET | Freq: Every day | ORAL | 3 refills | Status: DC

## 2016-12-10 NOTE — Progress Notes (Signed)
ID: DAVID TOWSON   DOB: April 04, 1946  MR#: [id number]  JZP#:[id number]  PCP: Marton Redwood, MD    INTERVAL HISTORY: Camdyn returns today for follow-up of her estrogen receptor positive breast cancer. The interval history is generally unremarkable. She continues on tamoxifen, with excellent tolerance. Hot flashes or vaginal discharge or minimal area she obtains a drug at a good price.[name]  REVIEW OF SYSTEMS: Latonia exercises by walking about 4 miles about 4 days a week. She and her husband are [place] more [place] the [place]. They enjoy walking their as well. She has developed a little jerky tremors which make her hand writing difficult and she started to present. She doesn't have a resting tremor however. She does have restless legs and she takes gabapentin for that. There is also helpful as far as hot flashes are concerned. She takes nitrofurantoin for her interstitial cystitis. That is also well t control. Aside from these issues a detailed review of systems today was stable  HISTORY OF PRESENT ILLNESS: From the original intake note:  She had a screening mammogram October 7th which showed a possible mass in the right breast.  Additional views October 13th showed a spiculated mass at the 4 o'clock position which mammographically measured 1.4 cm.  It was palpable by Dr. Sadie Haber at that time.  An ultrasound showed a spiculated hypoechoic mass measuring 1.2 cm by ultrasound.    Core biopsy was scheduled for the next day, October 14th, and showed ([id number]) an invasive ductal carcinoma, appearing low grade, 100% estrogen and 100% progesterone receptor positive with a low MIB-1 at 11% and negative for the HER-2 receptor overexpression test at 1+.    Bilateral breast MRIs October 22nd.  There was no evidence of lymphadenopathy on either side in the right breast.  There was a 1.4 cm spiculated enhancing mass, but no other area of abnormality.  With this information, the patient underwent  needle localization, right lumpectomy and sentinel lymph node biopsy on July 25, 2008.  The final pathology ([id number]) showed a 1.3 cm invasive ductal carcinoma, grade 1, with ample margins, positive for lymphovascular invasion, but with 0 of 2 sentinel lymph nodes involved.   Her subsequent history is as detailed below  PAST MEDICAL HISTORY: Past Medical History:  Diagnosis Date  . Breast cancer ([place])    s/p lumpectomy  . MVP (mitral valve prolapse)   . PAC (premature atrial contraction)   . Palpitations   She had a melanoma removed from the right side by [facility] in 1982.  She had a hemangioma removed from the left elbow in 1987.  She is status post tonsillectomy. She has a history of mitral valve prolapse and has been evaluated for this most recently by Pierre Bali, and she had an echocardiogram November 26, 2007 at [facility] which showed a normal left ventricle with an ejection fraction in the 55 to 65%, with mild mitral valve prolapse and regurgitation.  There was a small pericardial effusion.  Her understanding is that this is not enough of a concern that she would need antibiotic prophylaxis for dental or similar procedures.    PAST SURGICAL HISTORY: Past Surgical History:  Procedure Laterality Date  . BREAST BIOPSY  2009  . BREAST LUMPECTOMY    . HEMANGIOMA EXCISION Left 1990   arm  . MELANOMA EXCISION Right 1980   thigh  . NECK SURGERY  10/2009   C5-6 fusion    FAMILY HISTORY The patient's father is in his mid  2s. The patient's mother is also in her mid 31s.  She had a myocardial infarction remotely but is doing "okay" currently.  The patient has one sister in good health.  There is no history of breast or ovarian cancer in the family to her knowledge.  [name]  GYNECOLOGIC HISTORY: She is GX P2, first pregnancy to term age 86.  She went through the change of life more than 10 years ago and had been on hormones until her breast cancer diagnosis.    SOCIAL HISTORY: She  is a Horticulturist, commercial.  Her husband Waunita Schooner is retired from [employer].  They have a daughter, Alexis Goodell, who lives in [place] and has two children; a daughter, Kerri Perches, who lives in [place] and has one child.  They are members of [employer].   ADVANCED DIRECTIVES: in place  HEALTH MAINTENANCE: Social History  Substance Use Topics  . Smoking status: Never Smoker  . Smokeless tobacco: Never Used  . Alcohol use 0.0 oz/week     Comment: Socially     Colonoscopy: 2008  PAP: UTD/Neal  Bone density: osteopenia  Lipid panel:  Allergies  Allergen Reactions  . Codeine Nausea Only and Nausea And Vomiting    Current Outpatient Prescriptions  Medication Sig Dispense Refill  . amitriptyline (ELAVIL) 50 MG tablet Take 50 mg by mouth at bedtime as needed for sleep.    [name] aspirin 81 MG tablet Take 81 mg by mouth daily.    . Biotin 5000 MCG CAPS Take 5,000 mcg by mouth daily.      . cholecalciferol (VITAMIN D) 1000 UNITS tablet Take 1,000 Units by mouth daily. Taking 3 Days a Week    . fish oil-omega-3 fatty acids 1000 MG capsule Take 2 g by mouth daily.      [name] gabapentin (NEURONTIN) 300 MG capsule Take 900 mg by mouth 2 (two) times daily.     [name] levothyroxine (SYNTHROID) 50 MCG tablet Take 1 tablet (50 mcg total) by mouth daily before breakfast.    . metoprolol tartrate (LOPRESSOR) 25 MG tablet TAKE 1 TABLET BY MOUTH TWICE DAILY. 180 tablet 3  . multivitamin (THERAGRAN) per tablet Take 1 tablet by mouth daily. Reported on 03/25/2016    . nitrofurantoin (MACRODANTIN) 50 MG capsule Take 1 capsule (50 mg total) by mouth daily. 30 capsule 6  . tamoxifen (NOLVADEX) 20 MG tablet Take 1 tablet (20 mg total) by mouth daily. 90 tablet 3   No current facility-administered medications for this visit.     OBJECTIVE: Middle-aged white woman Who appears well Vitals:   12/10/16 1112  BP: 96/62  Pulse: 76  Resp: 18  Temp: 97.8 F (36.6 C)     Body mass index is 22.75 kg/m.    ECOG FS:  0  Sclerae unicteric, pupils round and equal Oropharynx clear and moist-- no thrush or other lesions No cervical or supraclavicular adenopathy Lungs no rales or rhonchi Heart regular rate and rhythm Abd soft, nontender, positive bowel sounds MSK no focal spinal tenderness, no upper extremity lymphedema Neuro: nonfocal, well oriented, appropriate affect Breasts: The right breast has undergone lumpectomy followed by radiation. There is the expected induration and slight shrinkage. There is no evidence of local recurrence. The left breast is unremarkable. Both axillae are benign.  LAB RESULTS: Lab Results  Component Value Date   WBC 4.2 12/10/2016   NEUTROABS 2.5 12/10/2016   HGB 12.7 12/10/2016   HCT 38.1 12/10/2016   MCV 89.4 12/10/2016   PLT  196 12/10/2016      Chemistry      Component Value Date/Time   NA 142 11/06/2015 1553   K 3.9 11/06/2015 1553   CL 104 10/05/2013 1639   CL 108 (H) 02/01/2013 0953   CO2 27 11/06/2015 1553   BUN 11.8 11/06/2015 1553   CREATININE 0.8 11/06/2015 1553      Component Value Date/Time   CALCIUM 9.2 11/06/2015 1553   ALKPHOS 71 11/06/2015 1553   AST 20 11/06/2015 1553   ALT 15 11/06/2015 1553   BILITOT <0.30 11/06/2015 1553       Lab Results  Component Value Date   LABCA2 23 08/04/2011    No components found for: GNPHQ301  No results for input(s): INR in the last 168 hours.  Urinalysis No results found for: COLORURINE  STUDIES: Mamm[facility] at the [facility] 10/22/2016 found the breast density to be category C. There was no evidence of malignancy.  ASSESSMEN 71 y.o. [place] woman status post right lumpectomy and sentinel lymph node sampling October 2009 for a T1c N0, stage IA  invasive ductal carcinoma, grade 1, strongly estrogen and progesterone receptor positive, HER2 negative, with a proliferation fraction under 13.   (a) s/p adjuvant radiation  (b) started tamoxifen in February 2010.  When she started Cymbalta, we  switched her to letrozole beginning January 2012. She then went off letrozole in May 2013, resuming tamoxifen August of 2013  PLAN: Yissel is now a little more than 8 years out from her definitive surgery for breast cancer with no evidence of disease recurrence. This is very favorable  Her breasts are still dense. Of course we make sure that she gets her mammography with her yearly mammography.  She is tolerating tamoxifen well. The original plan was to continue that for a total of 8 years but she didn't take letrozole for more than a year in that interval and that tells me that if she were to take tamoxifen one more year that would be adequate.  Accordingly the plan is for her to continue tamoxifen until she sees me next year at which time she will "graduate" from follow-up here.     Darcus Edds C    12/10/2016

## 2016-12-29 DIAGNOSIS — H16142 Punctate keratitis, left eye: Secondary | ICD-10-CM | POA: Diagnosis not present

## 2016-12-29 DIAGNOSIS — H11002 Unspecified pterygium of left eye: Secondary | ICD-10-CM | POA: Diagnosis not present

## 2016-12-30 ENCOUNTER — Encounter: Payer: Self-pay | Admitting: *Deleted

## 2017-01-07 ENCOUNTER — Ambulatory Visit (INDEPENDENT_AMBULATORY_CARE_PROVIDER_SITE_OTHER): Payer: PPO | Admitting: *Deleted

## 2017-01-07 DIAGNOSIS — I83891 Varicose veins of right lower extremities with other complications: Secondary | ICD-10-CM | POA: Diagnosis not present

## 2017-01-07 NOTE — Progress Notes (Signed)
X=.3% Sotradecol administered with a 27g butterfly.  Patient received a total of 18cc.  Treated the majority of her concerns, Easy access. Tol well. Anticipate good results. Will see her back 6/6 for her last ins covered tx.    Compression stockings applied: Yes.

## 2017-01-08 ENCOUNTER — Encounter: Payer: Self-pay | Admitting: *Deleted

## 2017-01-13 DIAGNOSIS — Z87440 Personal history of urinary (tract) infections: Secondary | ICD-10-CM | POA: Diagnosis not present

## 2017-01-14 DIAGNOSIS — H11002 Unspecified pterygium of left eye: Secondary | ICD-10-CM | POA: Diagnosis not present

## 2017-01-21 DIAGNOSIS — D2271 Melanocytic nevi of right lower limb, including hip: Secondary | ICD-10-CM | POA: Diagnosis not present

## 2017-01-21 DIAGNOSIS — D225 Melanocytic nevi of trunk: Secondary | ICD-10-CM | POA: Diagnosis not present

## 2017-01-21 DIAGNOSIS — D2272 Melanocytic nevi of left lower limb, including hip: Secondary | ICD-10-CM | POA: Diagnosis not present

## 2017-01-21 DIAGNOSIS — L814 Other melanin hyperpigmentation: Secondary | ICD-10-CM | POA: Diagnosis not present

## 2017-01-21 DIAGNOSIS — D1801 Hemangioma of skin and subcutaneous tissue: Secondary | ICD-10-CM | POA: Diagnosis not present

## 2017-01-21 DIAGNOSIS — Z85820 Personal history of malignant melanoma of skin: Secondary | ICD-10-CM | POA: Diagnosis not present

## 2017-01-21 DIAGNOSIS — D2262 Melanocytic nevi of left upper limb, including shoulder: Secondary | ICD-10-CM | POA: Diagnosis not present

## 2017-01-21 DIAGNOSIS — D2261 Melanocytic nevi of right upper limb, including shoulder: Secondary | ICD-10-CM | POA: Diagnosis not present

## 2017-01-21 DIAGNOSIS — L821 Other seborrheic keratosis: Secondary | ICD-10-CM | POA: Diagnosis not present

## 2017-03-02 ENCOUNTER — Encounter: Payer: Self-pay | Admitting: *Deleted

## 2017-03-04 ENCOUNTER — Ambulatory Visit (INDEPENDENT_AMBULATORY_CARE_PROVIDER_SITE_OTHER): Payer: PPO | Admitting: *Deleted

## 2017-03-04 DIAGNOSIS — I83891 Varicose veins of right lower extremities with other complications: Secondary | ICD-10-CM | POA: Diagnosis not present

## 2017-03-04 NOTE — Progress Notes (Signed)
X=.3% Sotradecol administered with a 27g butterfly.  Patient received a total of 6cc.  Cleaned up any remaining open sider veins. Easy access. Tol well. Anticipate good results. She did have three ulcers come up at injection sites around the ankle area. Resolving now.   Photos: No.  Compression stockings applied: Yes.

## 2017-03-05 ENCOUNTER — Encounter: Payer: Self-pay | Admitting: *Deleted

## 2017-03-16 ENCOUNTER — Ambulatory Visit (INDEPENDENT_AMBULATORY_CARE_PROVIDER_SITE_OTHER): Payer: PPO | Admitting: Orthopaedic Surgery

## 2017-03-16 ENCOUNTER — Ambulatory Visit (INDEPENDENT_AMBULATORY_CARE_PROVIDER_SITE_OTHER): Payer: PPO

## 2017-03-16 ENCOUNTER — Encounter (INDEPENDENT_AMBULATORY_CARE_PROVIDER_SITE_OTHER): Payer: Self-pay | Admitting: Orthopaedic Surgery

## 2017-03-16 VITALS — BP 97/58 | HR 72 | Ht 68.0 in | Wt 134.0 lb

## 2017-03-16 DIAGNOSIS — M25511 Pain in right shoulder: Secondary | ICD-10-CM

## 2017-03-16 DIAGNOSIS — G8929 Other chronic pain: Secondary | ICD-10-CM | POA: Diagnosis not present

## 2017-03-16 MED ORDER — LIDOCAINE HCL 1 % IJ SOLN
2.0000 mL | INTRAMUSCULAR | Status: AC | PRN
  Administered 2017-03-16: 2 mL

## 2017-03-16 MED ORDER — METHYLPREDNISOLONE ACETATE 40 MG/ML IJ SUSP
80.0000 mg | INTRAMUSCULAR | Status: AC | PRN
  Administered 2017-03-16: 80 mg

## 2017-03-16 MED ORDER — BUPIVACAINE HCL 0.5 % IJ SOLN
2.0000 mL | INTRAMUSCULAR | Status: AC | PRN
  Administered 2017-03-16: 2 mL via INTRA_ARTICULAR

## 2017-03-16 NOTE — Progress Notes (Signed)
Office Visit Note   Patient: Kellie Humphrey           Date of Birth: Jun 28, 1946           MRN: [id number] Visit Date: 03/16/2017              Requested by: Marton Redwood, MD [address] [place], [place] [address] PCP: Marton Redwood, MD   Assessment & Plan: Visit Diagnoses:  1. Chronic right shoulder pain   Impingement syndrome right shoulder  Plan: Subacromial cortisone injection, monitor her response. If no improvement or recurrent symptoms within the next 4-6 weeks would suggest an MRI scan of right shoulder  Follow-Up Instructions: Return if symptoms worsen or fail to improve.   Orders:  Orders Placed This Encounter  Procedures  . XR Shoulder Right   No orders of the defined types were placed in this encounter.     Procedures: Large Joint Inj Date/Time: 03/16/2017 11:48 AM Performed by: Garald Balding Authorized by: Garald Balding   Consent Given by:  Patient Timeout: prior to procedure the correct patient, procedure, and site was verified   Indications:  Pain Location:  Shoulder Site:  L subacromial bursa Prep: patient was prepped and draped in usual sterile fashion   Needle Size:  25 G Needle Length:  1.5 inches Approach:  Lateral Ultrasound Guidance: No   Fluoroscopic Guidance: No   Arthrogram: No   Medications:  80 mg methylPREDNISolone acetate 40 MG/ML; 2 mL lidocaine 1 %; 2 mL bupivacaine 0.5 % Aspiration Attempted: No   Patient tolerance:  Patient tolerated the procedure well with no immediate complications     Clinical Data: No additional findings.   Subjective: Chief Complaint  Patient presents with  . Right Shoulder - Pain    Kellie Humphrey is a 71 y o that presents with Right shoulder pain x 4 months. She relates tha pain is gradually getting worse and ROM behind her back is painful. Denies injury, no fever, chills, no radiation, no numbness.  Zoriana is been having trouble with her right shoulder for several months without history of  injury or trauma. She's had some difficulty raising her arm overhead related to her pain. Recently she's had a little trouble sleeping on that side. Pain seems localized to her shoulder. No injury or trauma. No bruising. Prior history of upper outer quadrant right breast cancer  HPI  Review of Systems   Objective: Vital Signs: BP (!) 97/58   Pulse 72   Ht 5\' 8"  (1.727 m)   Wt 134 lb (60.8 kg)   BMI 20.37 kg/m   Physical Exam  Ortho Exam right shoulder with minimally positive impingement on the extreme of external rotation. Mildly positive empty can test. Good strength. Full overhead motion. No loss of internal/external rotation. No popping or clicking. No bruising. Prominence of the acromioclavicular joint but without pain.  Specialty Comments:  No specialty comments available.  Imaging: Xr Shoulder Right  Result Date: 03/16/2017 Films of the right shoulder obtained in several projections. There are degenerative changes at the acromioclavicular joint. No ectopic calcification. Humeral head is centered about the glenoid. No evidence of osteoarthritis. Normal space between the humeral head and the acromion. Prior surgical clips from breast surgery    PMFS History: Patient Active Problem List   Diagnosis Date Noted  . Varicose veins of right lower extremity with complications 35/57/3220  . Paresthesias 07/29/2014  . Tremor 12/08/2013  . Personal history of breast cancer 09/06/2013  .  Dense breast 09/06/2013  . Malignant neoplasm of lower-outer quadrant of right breast of female, estrogen receptor positive ([place]) 02/11/2012  . Mitral valve disorder 05/08/2009  . PREMATURE ATRIAL CONTRACTIONS 05/08/2009   Past Medical History:  Diagnosis Date  . Breast cancer ([place])    s/p lumpectomy  . MVP (mitral valve prolapse)   . PAC (premature atrial contraction)   . Palpitations     Family History  Problem Relation Age of Onset  . Coronary artery disease Mother   . Hypertension  Mother   . Osteoporosis Mother        wrist fracture  . Osteoarthritis Father   . Neuropathy Father   . Tremor Father   . Other Father        lymphedema  . Fibromyalgia Sister   . Heart disease Maternal Grandmother     Past Surgical History:  Procedure Laterality Date  . BREAST BIOPSY  2009  . BREAST LUMPECTOMY    . HEMANGIOMA EXCISION Left 1990   arm  . MELANOMA EXCISION Right 1980   thigh  . NECK SURGERY  10/2009   C5-6 fusion   Social History   Occupational History  . Not on file.   Social History Main Topics  . Smoking status: Never Smoker  . Smokeless tobacco: Never Used  . Alcohol use 0.0 oz/week     Comment: Socially  . Drug use: No  . Sexual activity: Not on file     Garald Balding, MD   Note - This record has been created using [employer].  Chart creation errors have been sought, but may not always  have been located. Such creation errors do not reflect on  the standard of medical care.

## 2017-04-06 ENCOUNTER — Other Ambulatory Visit (HOSPITAL_COMMUNITY): Payer: Self-pay | Admitting: Adult Health

## 2017-04-10 ENCOUNTER — Telehealth (HOSPITAL_COMMUNITY): Payer: Self-pay | Admitting: Vascular Surgery

## 2017-04-10 DIAGNOSIS — I341 Nonrheumatic mitral (valve) prolapse: Secondary | ICD-10-CM

## 2017-04-10 MED ORDER — METOPROLOL TARTRATE 25 MG PO TABS: 25.0000 mg | ORAL_TABLET | Freq: Two times a day (BID) | ORAL | 3 refills | Status: DC

## 2017-04-10 NOTE — Telephone Encounter (Signed)
Pt need refill Metoprolol she needed to make appt first ava is Aug 24, can pt get a refill until her next appt... Please advise

## 2017-04-10 NOTE — Telephone Encounter (Signed)
Pt aware, rx sent in, pt is due for yearly echo and f/u, echo ordered and sch for 8/24

## 2017-05-22 ENCOUNTER — Encounter (HOSPITAL_COMMUNITY): Payer: Self-pay | Admitting: Internal Medicine

## 2017-05-22 ENCOUNTER — Ambulatory Visit (HOSPITAL_BASED_OUTPATIENT_CLINIC_OR_DEPARTMENT_OTHER)
Admission: RE | Admit: 2017-05-22 | Discharge: 2017-05-22 | Disposition: A | Discharge status: Home / Self Care | Payer: PPO | Source: Ambulatory Visit

## 2017-05-22 ENCOUNTER — Ambulatory Visit (HOSPITAL_COMMUNITY)
Admission: RE | Admit: 2017-05-22 | Discharge: 2017-05-22 | Disposition: A | Discharge status: Home / Self Care | Payer: PPO | Source: Ambulatory Visit | Attending: Internal Medicine | Admitting: Internal Medicine

## 2017-05-22 VITALS — BP 108/66 | HR 66 | Ht 68.0 in | Wt 139.6 lb

## 2017-05-22 DIAGNOSIS — Z853 Personal history of malignant neoplasm of breast: Secondary | ICD-10-CM | POA: Insufficient documentation

## 2017-05-22 DIAGNOSIS — I471 Supraventricular tachycardia: Secondary | ICD-10-CM | POA: Insufficient documentation

## 2017-05-22 DIAGNOSIS — I059 Rheumatic mitral valve disease, unspecified: Secondary | ICD-10-CM | POA: Diagnosis not present

## 2017-05-22 DIAGNOSIS — Z7982 Long term (current) use of aspirin: Secondary | ICD-10-CM | POA: Insufficient documentation

## 2017-05-22 DIAGNOSIS — I491 Atrial premature depolarization: Secondary | ICD-10-CM | POA: Insufficient documentation

## 2017-05-22 DIAGNOSIS — R002 Palpitations: Secondary | ICD-10-CM

## 2017-05-22 DIAGNOSIS — I341 Nonrheumatic mitral (valve) prolapse: Secondary | ICD-10-CM

## 2017-05-22 DIAGNOSIS — I340 Nonrheumatic mitral (valve) insufficiency: Secondary | ICD-10-CM

## 2017-05-22 NOTE — Progress Notes (Signed)
  Echocardiogram 2D Echocardiogram has been performed.  Kellie Humphrey 05/22/2017, 12:25 PM

## 2017-05-22 NOTE — Patient Instructions (Addendum)
Echocardiogram and Follow up in 1 year

## 2017-05-22 NOTE — Progress Notes (Signed)
ADVANCED HF CLINIC NOTE  Patient ID: Kellie Humphrey, female   DOB: 10/31/1945, 71 y.o.   MRN: [id number] PCP: Dr. Brigitte Pulse  HPI: Kellie Humphrey is a 71year-old woman with a history of mild mitral valve prolapse and associated PACs.  She had an exercise echo done for chest pain in 2009, this showed good exercise capacity with normal stress echo images.  She did have occasional PACs with brief 5-6 beat run of atrial tachycardia.  Sh presents for f/u. We have not seen her in about 2 years.   Follow up: Doing great. Walks 4 miles 3 days a week. Denies SOB/PND/Orthopnea. 1-2x per day has some skipped beats. Just lasts a few seconds and goes away. Longest episode is about 10 seconds. No syncope or presyncope.   Echo today reviewed personally EF 55% MV moderately thickened. Mild bileaflet MVP mild to moderate posterior MR.  No pulmonary HTN. No chance  08/31/2013 Echo: EF 50-55% LVIDd 4.6 MV moderately thickened. Mild bileaflet MVP mild to moderate posterior MR.  No pulmonary HTN.    ROS: All systems negative except as listed in HPI, PMH and Problem List.  Past Medical History:  Diagnosis Date  . Breast cancer ([place])    s/p lumpectomy  . MVP (mitral valve prolapse)   . PAC (premature atrial contraction)   . Palpitations     Current Outpatient Prescriptions  Medication Sig Dispense Refill  . amitriptyline (ELAVIL) 50 MG tablet Take 50 mg by mouth at bedtime as needed for sleep.    [name] aspirin 81 MG tablet Take 81 mg by mouth daily.    . Biotin 5000 MCG CAPS Take 5,000 mcg by mouth daily.      . cholecalciferol (VITAMIN D) 1000 UNITS tablet Take 1,000 Units by mouth daily. Taking 3 Days a Week    . fish oil-omega-3 fatty acids 1000 MG capsule Take 2 g by mouth daily.      [name] gabapentin (NEURONTIN) 300 MG capsule Take 900 mg by mouth 2 (two) times daily.     [name] levothyroxine (SYNTHROID) 50 MCG tablet Take 1 tablet (50 mcg total) by mouth daily before breakfast.    . metoprolol tartrate (LOPRESSOR) 25  MG tablet Take 1 tablet (25 mg total) by mouth 2 (two) times daily. 180 tablet 3  . nitrofurantoin (MACRODANTIN) 50 MG capsule Take 1 capsule (50 mg total) by mouth daily. 30 capsule 6  . tamoxifen (NOLVADEX) 20 MG tablet Take 1 tablet (20 mg total) by mouth daily. 90 tablet 3   No current facility-administered medications for this encounter.    Vitals:   05/22/17 1158  BP: 108/66  Pulse: 66  SpO2: 100%  Weight: 139 lb 9.6 oz (63.3 kg)  Height: 5\' 8"  (1.727 m)   PHYSICAL EXAM: General:  Well appearing. No resp difficulty HEENT: normal Neck: supple. no JVD. Carotids 2+ bilat; no bruits. No lymphadenopathy or thryomegaly appreciated. Cor: PMI nondisplaced. Regular rate & rhythm. No rubs, gallops. 1/6 MR Lungs: clear  Abdomen: soft, nontender, nondistended. No hepatosplenomegaly. No bruits or masses. Good bowel sounds. Extremities: no cyanosis, clubbing, rash, edema Neuro: alert & orientedx3, cranial nerves grossly intact. moves all 4 extremities w/o difficulty. Affect pleasant   ASSESSMENT & PLAN:  1) PACs - Still with brief palpitations. Nothing sustained. Discussed [employer] monitoring. Can consider event monitor if more frequent  2) MVP with mild to moderate MR - Echo reviewed personally. MR stable. LV EF and size stable. Continue to follow with early echos.  Would have low threshold to send for surgical evaluation if progressing any.  Follow up in 1 year with [facility]   Avon Mergenthaler MD 12:11 PM

## 2017-07-11 DIAGNOSIS — Z23 Encounter for immunization: Secondary | ICD-10-CM | POA: Diagnosis not present

## 2017-07-20 ENCOUNTER — Other Ambulatory Visit (INDEPENDENT_AMBULATORY_CARE_PROVIDER_SITE_OTHER): Payer: Self-pay | Admitting: *Deleted

## 2017-07-20 ENCOUNTER — Ambulatory Visit (INDEPENDENT_AMBULATORY_CARE_PROVIDER_SITE_OTHER): Payer: PPO | Admitting: Orthopaedic Surgery

## 2017-07-20 DIAGNOSIS — G8929 Other chronic pain: Secondary | ICD-10-CM | POA: Diagnosis not present

## 2017-07-20 DIAGNOSIS — M25511 Pain in right shoulder: Secondary | ICD-10-CM | POA: Diagnosis not present

## 2017-07-20 NOTE — Progress Notes (Signed)
Office Visit Note   Patient: Kellie Humphrey           Date of Birth: June 06, 1946           MRN: [id number] Visit Date: 07/20/2017              Requested by: Marton Redwood, MD [address] [place], [place] [address] PCP: Marton Redwood, MD   Assessment & Plan: Visit Diagnoses:  1. Chronic right shoulder pain   Possible rotator cuff tear with minimal response to subacromial cortisone injection in June. Plan: MRI scan right shoulder  Follow-Up Instructions: Return after MRI right shoulder.   Orders:  No orders of the defined types were placed in this encounter.  No orders of the defined types were placed in this encounter.     Procedures: No procedures performed   Clinical Data: No additional findings.   Subjective: No chief complaint on file. [name] was seen in June for evaluation of right shoulder pain. She had evidence of positive impingement. I injected the subacromial area with cortisone with minimal response. She now is at the point where she is having trouble sleeping and even more discomfort in raising her arm over her head. She denies any injury or trauma. She's had a prior cervical fusion but doesn't feel it is any correlation between her shoulder pain in her neck area she's not having any numbness or tingling.  HPI  Review of Systems   Objective: Vital Signs: There were no vitals taken for this visit.  Physical Exam  Ortho Exam wake alert and oriented 3. Comfortable sitting. A mildly painful overhead arc of motion. No crepitation. Positive impingement and positive empty can testing. No loss of motion. Skin intact. Biceps intact. Some anterior discomfort along the subacromial region with good grip and good release. No evidence of adhesive capsulitis. Good strength  Specialty Comments:  No specialty comments available.  Imaging: No results found.   PMFS History: Patient Active Problem List   Diagnosis Date Noted  . Varicose veins of right lower  extremity with complications 74/25/9563  . Paresthesias 07/29/2014  . Tremor 12/08/2013  . Personal history of breast cancer 09/06/2013  . Dense breast 09/06/2013  . Malignant neoplasm of lower-outer quadrant of right breast of female, estrogen receptor positive ([place]) 02/11/2012  . Mitral valve disorder 05/08/2009  . PREMATURE ATRIAL CONTRACTIONS 05/08/2009   Past Medical History:  Diagnosis Date  . Breast cancer ([place]ellie Humphrey)    s/p lumpectomy  . MVP (mitral valve prolapse)   . PAC (premature atrial contraction)   . Palpitations     Family History  Problem Relation Age of Onset  . Coronary artery disease Mother   . Hypertension Mother   . Osteoporosis Mother        wrist fracture  . Osteoarthritis Father   . Neuropathy Father   . Tremor Father   . Other Father        lymphedema  . Fibromyalgia Sister   . Heart disease Maternal Grandmother     Past Surgical History:  Procedure Laterality Date  . BREAST BIOPSY  2009  . BREAST LUMPECTOMY    . HEMANGIOMA EXCISION Left 1990   arm  . MELANOMA EXCISION Right 1980   thigh  . NECK SURGERY  10/2009   C5-6 fusion   Social History   Occupational History  . Not on file.   Social History Main Topics  . Smoking status: Never Smoker  . Smokeless tobacco: Never Used  .  Alcohol use 0.0 oz/week     Comment: Socially  . Drug use: No  . Sexual activity: Not on file     Garald Balding, MD   Note - This record has been created using [employer].  Chart creation errors have been sought, but may not always  have been located. Such creation errors do not reflect on  the standard of medical care.

## 2017-07-29 DIAGNOSIS — E038 Other specified hypothyroidism: Secondary | ICD-10-CM | POA: Diagnosis not present

## 2017-07-29 DIAGNOSIS — H5213 Myopia, bilateral: Secondary | ICD-10-CM | POA: Diagnosis not present

## 2017-07-29 DIAGNOSIS — H524 Presbyopia: Secondary | ICD-10-CM | POA: Diagnosis not present

## 2017-07-29 DIAGNOSIS — H52223 Regular astigmatism, bilateral: Secondary | ICD-10-CM | POA: Diagnosis not present

## 2017-07-29 DIAGNOSIS — M859 Disorder of bone density and structure, unspecified: Secondary | ICD-10-CM | POA: Diagnosis not present

## 2017-07-29 DIAGNOSIS — H11002 Unspecified pterygium of left eye: Secondary | ICD-10-CM | POA: Diagnosis not present

## 2017-07-29 DIAGNOSIS — R82998 Other abnormal findings in urine: Secondary | ICD-10-CM | POA: Diagnosis not present

## 2017-07-29 DIAGNOSIS — H2513 Age-related nuclear cataract, bilateral: Secondary | ICD-10-CM | POA: Diagnosis not present

## 2017-07-31 ENCOUNTER — Other Ambulatory Visit (HOSPITAL_COMMUNITY): Payer: Self-pay | Admitting: *Deleted

## 2017-08-02 ENCOUNTER — Other Ambulatory Visit: Payer: PPO

## 2017-08-02 ENCOUNTER — Ambulatory Visit
Admission: RE | Admit: 2017-08-02 | Discharge: 2017-08-02 | Disposition: A | Discharge status: Home / Self Care | Payer: PPO | Source: Ambulatory Visit | Attending: Orthopaedic Surgery | Admitting: Orthopaedic Surgery

## 2017-08-02 DIAGNOSIS — G8929 Other chronic pain: Secondary | ICD-10-CM

## 2017-08-02 DIAGNOSIS — M25511 Pain in right shoulder: Secondary | ICD-10-CM | POA: Diagnosis not present

## 2017-08-04 DIAGNOSIS — E038 Other specified hypothyroidism: Secondary | ICD-10-CM | POA: Diagnosis not present

## 2017-08-04 DIAGNOSIS — Z1389 Encounter for screening for other disorder: Secondary | ICD-10-CM | POA: Diagnosis not present

## 2017-08-04 DIAGNOSIS — N3010 Interstitial cystitis (chronic) without hematuria: Secondary | ICD-10-CM | POA: Diagnosis not present

## 2017-08-04 DIAGNOSIS — R002 Palpitations: Secondary | ICD-10-CM | POA: Diagnosis not present

## 2017-08-04 DIAGNOSIS — Z0000 Encounter for general adult medical examination without abnormal findings: Secondary | ICD-10-CM | POA: Diagnosis not present

## 2017-08-04 DIAGNOSIS — H9311 Tinnitus, right ear: Secondary | ICD-10-CM | POA: Diagnosis not present

## 2017-08-04 DIAGNOSIS — G250 Essential tremor: Secondary | ICD-10-CM | POA: Diagnosis not present

## 2017-08-04 DIAGNOSIS — H6121 Impacted cerumen, right ear: Secondary | ICD-10-CM | POA: Diagnosis not present

## 2017-08-04 DIAGNOSIS — Z6821 Body mass index (BMI) 21.0-21.9, adult: Secondary | ICD-10-CM | POA: Diagnosis not present

## 2017-08-04 DIAGNOSIS — C50911 Malignant neoplasm of unspecified site of right female breast: Secondary | ICD-10-CM | POA: Diagnosis not present

## 2017-08-04 DIAGNOSIS — M8580 Other specified disorders of bone density and structure, unspecified site: Secondary | ICD-10-CM | POA: Diagnosis not present

## 2017-08-10 ENCOUNTER — Ambulatory Visit (INDEPENDENT_AMBULATORY_CARE_PROVIDER_SITE_OTHER): Payer: Self-pay

## 2017-08-10 ENCOUNTER — Encounter (INDEPENDENT_AMBULATORY_CARE_PROVIDER_SITE_OTHER): Payer: Self-pay | Admitting: Orthopaedic Surgery

## 2017-08-10 ENCOUNTER — Ambulatory Visit (INDEPENDENT_AMBULATORY_CARE_PROVIDER_SITE_OTHER): Payer: PPO | Admitting: Orthopaedic Surgery

## 2017-08-10 VITALS — BP 108/66 | HR 89 | Resp 12 | Ht 67.5 in | Wt 139.0 lb

## 2017-08-10 DIAGNOSIS — M25511 Pain in right shoulder: Secondary | ICD-10-CM | POA: Diagnosis not present

## 2017-08-10 MED ORDER — BUPIVACAINE HCL 0.5 % IJ SOLN
2.0000 mL | INTRAMUSCULAR | Status: AC | PRN
  Administered 2017-08-10: 2 mL via INTRA_ARTICULAR

## 2017-08-10 MED ORDER — METHYLPREDNISOLONE ACETATE 40 MG/ML IJ SUSP
80.0000 mg | INTRAMUSCULAR | Status: AC | PRN
Start: 2017-08-10 — End: 2017-08-10
  Administered 2017-08-10: 80 mg

## 2017-08-10 MED ORDER — LIDOCAINE HCL 2 % IJ SOLN
2.0000 mL | INTRAMUSCULAR | Status: AC | PRN
  Administered 2017-08-10: 2 mL

## 2017-08-10 NOTE — Progress Notes (Signed)
Office Visit Note   Patient: Kellie Humphrey           Date of Birth: Dec 07, 1945           MRN: [id number] Visit Date: 08/10/2017              Requested by: Marton Redwood, MD [id number] [place], [place] [address] PCP: Marton Redwood, MD   Assessment & Plan: Visit Diagnoses:  1. Right shoulder pain, unspecified chronicity   MRI scan demonstrates moderate osteoarthritis at the acromioclavicular joint right shoulder with mild infra and supraspinatus tendinosis  Plan: Long discussion regarding the MRI scan findings. We'll try a course of physical therapy. Inject the acromioclavicular joint with Xylocaine and monitor her response  Follow-Up Instructions: Return if symptoms worsen or fail to improve.   Orders:  Orders Placed This Encounter  Procedures  . Large Joint Inj: R subacromial bursa  . Ambulatory referral to Physical Therapy   No orders of the defined types were placed in this encounter.     Procedures: Large Joint Inj: R subacromial bursa on 08/10/2017 3:38 PM Indications: pain and diagnostic evaluation Details: 25 G 1.5 in needle, anterolateral approach  Arthrogram: No  Medications: 2 mL lidocaine 2 %; 2 mL bupivacaine 0.5 %; 80 mg methylPREDNISolone acetate 40 MG/ML Consent was given by the patient. Immediately prior to procedure a time out was called to verify the correct patient, procedure, equipment, support staff and site/side marked as required. Patient was prepped and draped in the usual sterile fashion.       Clinical Data: No additional findings.   Subjective: Chief Complaint  Patient presents with  . Right Shoulder - Pain    Kellie Humphrey is a 71 y o here for Right shoulder MRI results  No change in symptoms. Still having some difficulty with overhead motion. No numbness or tingling or pain referred from cervical spine  HPI  Review of Systems  Constitutional: Negative for chills, fatigue and fever.  Eyes: Negative for itching.  Respiratory:  Negative for chest tightness and shortness of breath.   Cardiovascular: Negative for chest pain, palpitations and leg swelling.  Gastrointestinal: Negative for blood in stool, constipation and diarrhea.  Endocrine: Negative for polyuria.  Genitourinary: Negative for dysuria.  Musculoskeletal: Positive for neck stiffness. Negative for back pain and joint swelling.  Allergic/Immunologic: Negative for immunocompromised state.  Neurological: Negative for dizziness and numbness.  Hematological: Does not bruise/bleed easily.  Psychiatric/Behavioral: The patient is not nervous/anxious.      Objective: Vital Signs: BP 108/66   Pulse 89   Resp 12   Ht 5' 7.5" (1.715 m)   Wt 139 lb (63 kg)   BMI 21.45 kg/m   Physical Exam  Ortho Exam awake alert and oriented 3. Comfortable sitting. Hypertrophy about the acromioclavicular joint was some mild discomfort. Full overhead motion. Full internal rotation. Good strength. Neurovascular exam intact. Skin intact. No evidence of adhesive capsulitis or instability sepsis intact.  Specialty Comments:  No specialty comments available.  Imaging: No results found.   PMFS History: Patient Active Problem List   Diagnosis Date Noted  . Varicose veins of right lower extremity with complications 32/67/1245  . Paresthesias 07/29/2014  . Tremor 12/08/2013  . Personal history of breast cancer 09/06/2013  . Dense breast 09/06/2013  . Malignant neoplasm of lower-outer quadrant of right breast of female, estrogen receptor positive ([place]) 02/11/2012  . Mitral valve disorder 05/08/2009  . PREMATURE ATRIAL CONTRACTIONS 05/08/2009   Past Medical  History:  Diagnosis Date  . Breast cancer ([place])    s/p lumpectomy  . MVP (mitral valve prolapse)   . PAC (premature atrial contraction)   . Palpitations     Family History  Problem Relation Age of Onset  . Coronary artery disease Mother   . Hypertension Mother   . Osteoporosis Mother        wrist fracture  .  Osteoarthritis Father   . Neuropathy Father   . Tremor Father   . Other Father        lymphedema  . Fibromyalgia Sister   . Heart disease Maternal Grandmother     Past Surgical History:  Procedure Laterality Date  . BREAST BIOPSY  2009  . BREAST LUMPECTOMY    . HEMANGIOMA EXCISION Left 1990   arm  . MELANOMA EXCISION Right 1980   thigh  . NECK SURGERY  10/2009   C5-6 fusion   Social History   Occupational History  . Not on file  Tobacco Use  . Smoking status: Never Smoker  . Smokeless tobacco: Never Used  Substance and Sexual Activity  . Alcohol use: Yes    Alcohol/week: 0.0 oz    Comment: Socially  . Drug use: No  . Sexual activity: Not on file

## 2017-08-14 DIAGNOSIS — M25611 Stiffness of right shoulder, not elsewhere classified: Secondary | ICD-10-CM | POA: Diagnosis not present

## 2017-08-14 DIAGNOSIS — M25511 Pain in right shoulder: Secondary | ICD-10-CM | POA: Diagnosis not present

## 2017-08-14 DIAGNOSIS — M6281 Muscle weakness (generalized): Secondary | ICD-10-CM | POA: Diagnosis not present

## 2017-09-01 DIAGNOSIS — H9313 Tinnitus, bilateral: Secondary | ICD-10-CM | POA: Diagnosis not present

## 2017-09-01 DIAGNOSIS — H903 Sensorineural hearing loss, bilateral: Secondary | ICD-10-CM | POA: Diagnosis not present

## 2017-09-02 ENCOUNTER — Encounter: Payer: Self-pay | Admitting: Internal Medicine

## 2017-09-08 ENCOUNTER — Other Ambulatory Visit: Payer: Self-pay | Admitting: Obstetrics & Gynecology

## 2017-09-08 DIAGNOSIS — Z1231 Encounter for screening mammogram for malignant neoplasm of breast: Secondary | ICD-10-CM

## 2017-09-09 ENCOUNTER — Encounter: Payer: Self-pay | Admitting: Internal Medicine

## 2017-09-17 ENCOUNTER — Other Ambulatory Visit: Payer: Self-pay

## 2017-09-17 ENCOUNTER — Ambulatory Visit (AMBULATORY_SURGERY_CENTER): Payer: Self-pay

## 2017-09-17 VITALS — Ht 68.0 in | Wt 135.6 lb

## 2017-09-17 DIAGNOSIS — Z86010 Personal history of colonic polyps: Secondary | ICD-10-CM

## 2017-09-17 NOTE — Progress Notes (Signed)
Denies allergies to eggs or soy products. Denies complication of anesthesia or sedation. Denies use of weight loss medication. Denies use of O2.   Emmi instructions declined.  

## 2017-10-13 ENCOUNTER — Encounter: Payer: Self-pay | Admitting: Internal Medicine

## 2017-10-21 ENCOUNTER — Other Ambulatory Visit: Payer: Self-pay

## 2017-10-21 ENCOUNTER — Ambulatory Visit (AMBULATORY_SURGERY_CENTER): Payer: PPO | Admitting: Internal Medicine

## 2017-10-21 ENCOUNTER — Encounter: Payer: Self-pay | Admitting: Internal Medicine

## 2017-10-21 VITALS — BP 99/61 | HR 72 | Temp 97.5°F | Resp 11 | Ht 68.0 in | Wt 135.0 lb

## 2017-10-21 DIAGNOSIS — Z1212 Encounter for screening for malignant neoplasm of rectum: Secondary | ICD-10-CM | POA: Diagnosis not present

## 2017-10-21 DIAGNOSIS — Z1211 Encounter for screening for malignant neoplasm of colon: Secondary | ICD-10-CM | POA: Diagnosis not present

## 2017-10-21 MED ORDER — SODIUM CHLORIDE 0.9 % IV SOLN: 500.0000 mL | Freq: Once | INTRAVENOUS | Status: DC

## 2017-10-21 NOTE — Progress Notes (Signed)
Pt's states no medical or surgical changes since previsit or office visit. 

## 2017-10-21 NOTE — Progress Notes (Signed)
A/ox3 pleased with MAC, report to [facility]

## 2017-10-21 NOTE — Op Note (Signed)
[facility] Patient Name: Kellie Humphrey Procedure Date: 10/21/2017 2:25 PM MRN: [id number] Endoscopist: Gatha Mayer , MD Age: 72 Referring MD:  Date of Birth: 11/30/1945 Gender: Female Account #: [id number] Procedure:                Colonoscopy Indications:              Screening for colorectal malignant neoplasm, Last                            colonoscopy: 2008 Medicines:                Propofol per Anesthesia, Monitored Anesthesia Care Procedure:                Pre-Anesthesia Assessment:                           - Prior to the procedure, a History and Physical                            was performed, and patient medications and                            allergies were reviewed. The patient's tolerance of                            previous anesthesia was also reviewed. The risks                            and benefits of the procedure and the sedation                            options and risks were discussed with the patient.                            All questions were answered, and informed consent                            was obtained. Prior Anticoagulants: The patient has                            taken no previous anticoagulant or antiplatelet                            agents. ASA Grade Assessment: II - A patient with                            mild systemic disease. After reviewing the risks                            and benefits, the patient was deemed in                            satisfactory condition to undergo the procedure.  After obtaining informed consent, the colonoscope                            was passed under direct vision. Throughout the                            procedure, the patient's blood pressure, pulse, and                            oxygen saturations were monitored continuously. The                            Model PCF-H190DL [id number]) scope was introduced                            through the anus  and advanced to the the cecum,                            identified by appendiceal orifice and ileocecal                            valve. The colonoscopy was performed without                            difficulty. The patient tolerated the procedure                            well. The quality of the bowel preparation was                            excellent. The bowel preparation used was Miralax.                            The ileocecal valve, appendiceal orifice, and                            rectum were photographed. Scope In: 2:36:45 PM Scope Out: 2:50:53 PM Scope Withdrawal Time: 0 hours 10 minutes 44 seconds  Total Procedure Duration: 0 hours 14 minutes 8 seconds  Findings:                 The perianal and digital rectal examinations were                            normal.                           The colon (entire examined portion) appeared normal.                           No additional abnormalities were found on                            retroflexion. Complications:            No immediate complications. Estimated Blood Loss:  Estimated blood loss: none. Impression:               - The entire examined colon is normal.                           - No specimens collected. Recommendation:           - Patient has a contact number available for                            emergencies. The signs and symptoms of potential                            delayed complications were discussed with the                            patient. Return to normal activities tomorrow.                            Written discharge instructions were provided to the                            patient.                           - Resume previous diet.                           - Continue present medications.                           - No repeat colonoscopy due to age and the absence                            of advanced adenomas. Gatha Mayer, MD 10/21/2017 2:57:10 PM This report has been  signed electronically.

## 2017-10-21 NOTE — Patient Instructions (Addendum)
   No polyps (or cancer) seen.  You will be 55 in 10 years and routine colonoscopy not currently recommended around that age so I do not plan to repeat a routine colonoscopy then.  I appreciate the opportunity to care for you. Gatha Mayer, MD, FACG   YOU HAD AN ENDOSCOPIC PROCEDURE TODAY AT [facility]:   Refer to the procedure report that was given to you for any specific questions about what was found during the examination.  If the procedure report does not answer your questions, please call your gastroenterologist to clarify.  If you requested that your care partner not be given the details of your procedure findings, then the procedure report has been included in a sealed envelope for you to review at your convenience later.  YOU SHOULD EXPECT: Some feelings of bloating in the abdomen. Passage of more gas than usual.  Walking can help get rid of the air that was put into your GI tract during the procedure and reduce the bloating. If you had a lower endoscopy (such as a colonoscopy or flexible sigmoidoscopy) you may notice spotting of blood in your stool or on the toilet paper. If you underwent a bowel prep for your procedure, you may not have a normal bowel movement for a few days.  Please Note:  You might notice some irritation and congestion in your nose or some drainage.  This is from the oxygen used during your procedure.  There is no need for concern and it should clear up in a day or so.  SYMPTOMS TO REPORT IMMEDIATELY:   Following lower endoscopy (colonoscopy or flexible sigmoidoscopy):  Excessive amounts of blood in the stool  Significant tenderness or worsening of abdominal pains  Swelling of the abdomen that is new, acute  Fever of 100F or higher   For urgent or emergent issues, a gastroenterologist can be reached at any hour by calling [phone].   DIET:  We do recommend a small meal at first, but then you may proceed to your regular diet.   Drink plenty of fluids but you should avoid alcoholic beverages for 24 hours.  ACTIVITY:  You should plan to take it easy for the rest of today and you should NOT DRIVE or use heavy machinery until tomorrow (because of the sedation medicines used during the test).    FOLLOW UP: Our staff will call the number listed on your records the next business day following your procedure to check on you and address any questions or concerns that you may have regarding the information given to you following your procedure. If we do not reach you, we will leave a message.  However, if you are feeling well and you are not experiencing any problems, there is no need to return our call.  We will assume that you have returned to your regular daily activities without incident.  If any biopsies were taken you will be contacted by phone or by letter within the next 1-3 weeks.  Please call us at [phone] if you have not heard about the biopsies in 3 weeks.    SIGNATURES/CONFIDENTIALITY: You and/or your care partner have signed paperwork which will be entered into your electronic medical record.  These signatures attest to the fact that that the information above on your After Visit Summary has been reviewed and is understood.  Full responsibility of the confidentiality of this discharge information lies with you and/or your care-partner.   Resume medications.

## 2017-10-22 ENCOUNTER — Telehealth: Payer: Self-pay

## 2017-10-22 DIAGNOSIS — Z01419 Encounter for gynecological examination (general) (routine) without abnormal findings: Secondary | ICD-10-CM | POA: Diagnosis not present

## 2017-10-22 DIAGNOSIS — Z124 Encounter for screening for malignant neoplasm of cervix: Secondary | ICD-10-CM | POA: Diagnosis not present

## 2017-10-22 DIAGNOSIS — Z6820 Body mass index (BMI) 20.0-20.9, adult: Secondary | ICD-10-CM | POA: Diagnosis not present

## 2017-10-22 NOTE — Telephone Encounter (Signed)
Left message on answering machine. 

## 2017-10-27 ENCOUNTER — Ambulatory Visit
Admission: RE | Admit: 2017-10-27 | Discharge: 2017-10-27 | Disposition: A | Discharge status: Home / Self Care | Payer: PPO | Source: Ambulatory Visit | Attending: Obstetrics & Gynecology | Admitting: Obstetrics & Gynecology

## 2017-10-27 DIAGNOSIS — Z1231 Encounter for screening mammogram for malignant neoplasm of breast: Secondary | ICD-10-CM

## 2017-12-08 NOTE — Progress Notes (Signed)
[facility]  Telephone:([phone] [phone]    ID: Kellie Humphrey   DOB: 01/20/1946  MR#: [id number]  QVZ#:[id number]   Patient Care Team: Marton Redwood, MD as PCP - General (Internal Medicine) Bensimhon, Shaune Pascal, MD (Cardiology) Keny Donald, Virgie Dad, MD as Consulting Physician (Oncology) Maisie Fus, MD as Consulting Physician (Obstetrics and Gynecology)   INTERVAL HISTORY: Kellie Humphrey returns today for follow-up of her estrogen receptor positive breast cancer.she continues on tamoxifen, which she tolerates well, without any side effects that she is aware of.   Since her last visit here she had bilateral screening mammography with tomography, on 10/27/2017, showing the breast density to be category C.  There was no evidence of malignancy.   REVIEW OF SYSTEMS: Kellie Humphrey is doing well. She has been staying busy, walking, going to the mountains and babysitting grandchildren. She denies unusual headaches, visual changes, nausea, vomiting, or dizziness. There has been no unusual cough, phlegm production, or pleurisy. This been no change in bowel or bladder habits. She denies unexplained fatigue or unexplained weight loss, bleeding, rash, or fever. A detailed review of systems was otherwise noncontributory.   HISTORY OF PRESENT ILLNESS: From the original intake note:  She had a screening mammogram October 7th which showed a possible mass in the right breast.  Additional views October 13th showed a spiculated mass at the 4 o'clock position which mammographically measured 1.4 cm.  It was palpable by Dr. Sadie Haber at that time.  An ultrasound showed a spiculated hypoechoic mass measuring 1.2 cm by ultrasound.    Core biopsy was scheduled for the next day, October 14th, and showed ([id number] and [id number]) an invasive ductal carcinoma, appearing low grade, 100% estrogen and 100% progesterone receptor positive with a low MIB-1 at 11% and negative for the HER-2 receptor  overexpression test at 1+.    Bilateral breast MRIs October 22nd.  There was no evidence of lymphadenopathy on either side in the right breast.  There was a 1.4 cm spiculated enhancing mass, but no other area of abnormality.  With this information, the patient underwent needle localization, right lumpectomy and sentinel lymph node biopsy on July 25, 2008.  The final pathology ([id number]) showed a 1.3 cm invasive ductal carcinoma, grade 1, with ample margins, positive for lymphovascular invasion, but with 0 of 2 sentinel lymph nodes involved.   Her subsequent history is as detailed below  PAST MEDICAL HISTORY: Past Medical History:  Diagnosis Date  . Arthritis   . Breast cancer ([place])    s/p lumpectomy  . Cataract   . MVP (mitral valve prolapse)   . PAC (premature atrial contraction)   . Palpitations   . Thyroid disease   She had a melanoma removed from the right side by [facility] in 1982.  She had a hemangioma removed from the left elbow in 1987.  She is status post tonsillectomy. She has a history of mitral valve prolapse and has been evaluated for this most recently by Pierre Bali, and she had an echocardiogram November 26, 2007 at [facility] which showed a normal left ventricle with an ejection fraction in the 55 to 65%, with mild mitral valve prolapse and regurgitation.  There was a small pericardial effusion.  Her understanding is that this is not enough of a concern that she would need antibiotic prophylaxis for dental or similar procedures.    PAST SURGICAL HISTORY: Past Surgical History:  Procedure Laterality Date  . BREAST BIOPSY  2009  . BREAST LUMPECTOMY    .  HEMANGIOMA EXCISION Left 1990   arm  . MELANOMA EXCISION Right 1980   thigh  . NECK SURGERY  10/2009   C5-6 fusion  . TONSILLECTOMY      FAMILY HISTORY The patient's father is in his mid 72s. The patient's mother is also in her mid 13s.  She had a myocardial infarction remotely but is doing "okay" currently.   The patient has one sister in good health.  There is no history of breast or ovarian cancer in the family to her knowledge.  [name]  GYNECOLOGIC HISTORY: She is GX P2, first pregnancy to term age 46.  She went through the change of life more than 10 years ago and had been on hormones until her breast cancer diagnosis.    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  They have a daughter, Kellie Humphrey, who lives in [place] and has two children; a daughter, Kellie Humphrey, who lives in [place] and has one child.  They are members of [employer].   ADVANCED DIRECTIVES: in place  HEALTH MAINTENANCE: Social History   Tobacco Use  . Smoking status: Never Smoker  . Smokeless tobacco: Never Used  Substance Use Topics  . Alcohol use: Yes    Alcohol/week: 0.0 oz    Comment: Socially  . Drug use: No     Colonoscopy: 2008  PAP: UTD/Neal  Bone density: osteopenia  Lipid panel:  Allergies  Allergen Reactions  . Codeine Nausea Only and Nausea And Vomiting    Current Outpatient Medications  Medication Sig Dispense Refill  . amitriptyline (ELAVIL) 50 MG tablet Take 50 mg by mouth at bedtime as needed for sleep.    . Biotin 5000 MCG CAPS Take 5,000 mcg by mouth daily.      . cholecalciferol (VITAMIN D) 1000 UNITS tablet Take 1,000 Units by mouth daily. Taking 3 Days a Week    . fish oil-omega-3 fatty acids 1000 MG capsule Take 2 g by mouth daily.      [name] gabapentin (NEURONTIN) 300 MG capsule Take 900 mg by mouth 2 (two) times daily.     [name] levothyroxine (SYNTHROID) 50 MCG tablet Take 1 tablet (50 mcg total) by mouth daily before breakfast.    . metoprolol tartrate (LOPRESSOR) 25 MG tablet Take 1 tablet (25 mg total) by mouth 2 (two) times daily. 180 tablet 3  . nitrofurantoin (MACRODANTIN) 50 MG capsule Take 1 capsule (50 mg total) by mouth daily. 30 capsule 6  . polyethylene glycol powder (GLYCOLAX/MIRALAX) powder Take 1 Container by mouth once.    .  tamoxifen (NOLVADEX) 20 MG tablet Take 1 tablet (20 mg total) by mouth daily. 90 tablet 3   No current facility-administered medications for this visit.     OBJECTIVE: Middle-aged white woman in no acute distress Vitals:   12/09/17 1131  BP: (!) 104/55  Pulse: 71  Resp: 18  Temp: 98.1 F (36.7 C)  SpO2: 100%     Body mass index is 21.06 kg/m.    ECOG FS: 0  Sclerae unicteric, EOMs intact Oropharynx clear and moist No cervical or supraclavicular adenopathy Lungs no rales or rhonchi Heart regular rate and rhythm Abd soft, nontender, positive bowel sounds MSK no focal spinal tenderness, no upper extremity lymphedema Neuro: nonfocal, well oriented, appropriate affect Breasts: The right breast is status post lumpectomy and radiation.  There is no evidence of local recurrence.  The left breast is benign.  Both axillae are  benign.  LAB RESULTS: Lab Results  Component Value Date   WBC 3.4 (L) 12/09/2017   NEUTROABS 1.9 12/09/2017   HGB 12.3 12/09/2017   HCT 37.4 12/09/2017   MCV 90.0 12/09/2017   PLT 185 12/09/2017      Chemistry      Component Value Date/Time   NA 143 12/10/2016 1100   K 4.1 12/10/2016 1100   CL 104 10/05/2013 1639   CL 108 (H) 02/01/2013 0953   CO2 28 12/10/2016 1100   BUN 8.6 12/10/2016 1100   CREATININE 0.8 12/10/2016 1100      Component Value Date/Time   CALCIUM 9.0 12/10/2016 1100   ALKPHOS 64 12/10/2016 1100   AST 20 12/10/2016 1100   ALT 10 12/10/2016 1100   BILITOT 0.34 12/10/2016 1100       Lab Results  Component Value Date   LABCA2 23 08/04/2011    No components found for: [id number]  No results for input(s): INR in the last 168 hours.  Urinalysis No results found for: COLORURINE  STUDIES: Since her last visit here she had bilateral screening mammography with tomography, on 10/27/2017, showing the breast density to be category C.  There was no evidence of malignancy.  ASSESSMEN 72 y.o. [place] woman status post right  lumpectomy and sentinel lymph node sampling October 2009 for a T1c N0, stage IA  invasive ductal carcinoma, grade 1, strongly estrogen and progesterone receptor positive, HER2 negative, with a proliferation fraction under 13.   (a) s/p adjuvant radiation  (b) started tamoxifen in February 2010.  When she started Cymbalta, we switched her to letrozole beginning January 2012. She then went off letrozole in May 2013, resuming tamoxifen August of 2013  PLAN: Kellie Humphrey is now nearly 10 years out from definitive surgery for her breast cancer with no evidence of disease recurrence.  This is very favorable.  I am comfortable with her stopping tamoxifen at this point.  She will take her current supply and not refill it.  She understands tamoxifen will "taper it itself off".  It takes about 4 months for the blood level to become undetectable once she stops the medication  Since she really was having no side effects from the treatment I doubt that she will notice any difference going off it.  At this point she is being released to her primary care physician.  All she will need in terms of breast cancer follow-up is yearly mammography and a yearly physician breast exam.  I gave her some information on the a light program in case she would like to volunteer here.  I will be glad to see Kellie Humphrey at any point in the future if and when the need arises but as of now are making no routine appointments for her here   Chauncey Cruel, MD  12/09/17 11:57 AM Medical Oncology and Hematology [facility] [place], [place] [address] Tel. [phone]    Fax. [phone]   This document serves as a record of services personally performed by Chauncey Cruel, MD. It was created on his behalf by Margit Banda, a trained medical scribe. The creation of this record is based on the scribe's personal observations and the provider's statements to them.   I have reviewed the above  documentation for accuracy and completeness, and I agree with the above.

## 2017-12-09 ENCOUNTER — Inpatient Hospital Stay: Payer: PPO | Attending: Oncology | Admitting: Oncology

## 2017-12-09 ENCOUNTER — Inpatient Hospital Stay: Payer: PPO

## 2017-12-09 VITALS — BP 104/55 | HR 71 | Temp 98.1°F | Resp 18 | Ht 68.0 in | Wt 138.5 lb

## 2017-12-09 DIAGNOSIS — Z85820 Personal history of malignant melanoma of skin: Secondary | ICD-10-CM | POA: Insufficient documentation

## 2017-12-09 DIAGNOSIS — Z170 Estrogen receptor positive status [ER+]: Secondary | ICD-10-CM

## 2017-12-09 DIAGNOSIS — E079 Disorder of thyroid, unspecified: Secondary | ICD-10-CM | POA: Diagnosis not present

## 2017-12-09 DIAGNOSIS — C50511 Malignant neoplasm of lower-outer quadrant of right female breast: Secondary | ICD-10-CM | POA: Diagnosis not present

## 2017-12-09 DIAGNOSIS — Z79899 Other long term (current) drug therapy: Secondary | ICD-10-CM | POA: Insufficient documentation

## 2017-12-09 DIAGNOSIS — Z79810 Long term (current) use of selective estrogen receptor modulators (SERMs): Secondary | ICD-10-CM | POA: Diagnosis not present

## 2017-12-09 DIAGNOSIS — C50411 Malignant neoplasm of upper-outer quadrant of right female breast: Secondary | ICD-10-CM | POA: Insufficient documentation

## 2017-12-09 DIAGNOSIS — I341 Nonrheumatic mitral (valve) prolapse: Secondary | ICD-10-CM | POA: Diagnosis not present

## 2017-12-09 LAB — CBC WITH DIFFERENTIAL/PLATELET
BASOS ABS: 0 10*3/uL (ref 0.0–0.1)
BASOS PCT: 1 %
EOS PCT: 4 %
Eosinophils Absolute: 0.1 10*3/uL (ref 0.0–0.5)
HCT: 37.4 % (ref 34.8–46.6)
Hemoglobin: 12.3 g/dL (ref 11.6–15.9)
Lymphocytes Relative: 29 %
Lymphs Abs: 1 10*3/uL (ref 0.9–3.3)
MCH: 29.6 pg (ref 25.1–34.0)
MCHC: 32.9 g/dL (ref 31.5–36.0)
MCV: 90 fL (ref 79.5–101.0)
MONO ABS: 0.4 10*3/uL (ref 0.1–0.9)
Monocytes Relative: 12 %
Neutro Abs: 1.9 10*3/uL (ref 1.5–6.5)
Neutrophils Relative %: 54 %
PLATELETS: 185 10*3/uL (ref 145–400)
RBC: 4.16 MIL/uL (ref 3.70–5.45)
RDW: 14.1 % (ref 11.2–14.5)
WBC: 3.4 10*3/uL — ABNORMAL LOW (ref 3.9–10.3)

## 2017-12-09 LAB — COMPREHENSIVE METABOLIC PANEL
ALBUMIN: 3.7 g/dL (ref 3.5–5.0)
ALT: 11 U/L (ref 0–55)
AST: 19 U/L (ref 5–34)
Alkaline Phosphatase: 61 U/L (ref 40–150)
Anion gap: 7 (ref 3–11)
BUN: 10 mg/dL (ref 7–26)
CO2: 28 mmol/L (ref 22–29)
Calcium: 9 mg/dL (ref 8.4–10.4)
Chloride: 107 mmol/L (ref 98–109)
Creatinine, Ser: 0.81 mg/dL (ref 0.60–1.10)
GFR calc Af Amer: >60 mL/min (ref >60)
GFR calc non Af Amer: >60 mL/min (ref >60)
GLUCOSE: 73 mg/dL (ref 70–140)
POTASSIUM: 3.9 mmol/L (ref 3.5–5.1)
Sodium: 142 mmol/L (ref 136–145)
Total Bilirubin: 0.4 mg/dL (ref 0.2–1.2)
Total Protein: 6.8 g/dL (ref 6.4–8.3)

## 2017-12-10 ENCOUNTER — Telehealth: Payer: Self-pay | Admitting: Oncology

## 2017-12-10 NOTE — Telephone Encounter (Signed)
Per 3/13 no los

## 2018-01-18 ENCOUNTER — Encounter (INDEPENDENT_AMBULATORY_CARE_PROVIDER_SITE_OTHER): Payer: Self-pay | Admitting: Orthopaedic Surgery

## 2018-01-18 ENCOUNTER — Ambulatory Visit (INDEPENDENT_AMBULATORY_CARE_PROVIDER_SITE_OTHER): Payer: PPO | Admitting: Orthopaedic Surgery

## 2018-01-18 VITALS — BP 109/62 | HR 76 | Resp 14 | Ht 67.0 in | Wt 135.0 lb

## 2018-01-18 DIAGNOSIS — M25511 Pain in right shoulder: Secondary | ICD-10-CM

## 2018-01-18 DIAGNOSIS — G8929 Other chronic pain: Secondary | ICD-10-CM | POA: Diagnosis not present

## 2018-01-18 NOTE — Progress Notes (Signed)
Office Visit Note   Patient: Kellie Humphrey           Date of Birth: Feb 10, 1946           MRN: [id number] Visit Date: 01/18/2018              Requested by: Marton Redwood, MD [address] [place], [place] [address] PCP: Marton Redwood, MD   Assessment & Plan: Visit Diagnoses:  1. Chronic right shoulder pain     Plan: We will schedule right shoulder surgery to include an arthroscopic subacromial decompression and distal clavicle resection based on the MRI scan findings from last year. long discussion regarding surgery, potential complications, potential for pain relief.  Etc.  Would like to proceed.  Set this up sometime in May  Follow-Up Instructions: Return will schedule right shoulder surgery.   Orders:  No orders of the defined types were placed in this encounter.  No orders of the defined types were placed in this encounter.     Procedures: No procedures performed   Clinical Data: No additional findings.   Subjective: Chief Complaint  Patient presents with  . Right Shoulder - Pain  . Shoulder Pain    Right shoulder pain x 1 year, burning, limited range of motion, has failed PT, difficulty sleeping at night, no injury, no surgery to right shoulder, cortisone inj x 2 didn't help, IBU helps, not diabetic, wants to discuss surgery  [place] had an MRI scan of her right shoulder in November 2018.  This demonstrated moderately severe acromioclavicular joint osteoarthritis and mild supraspinatus tendinopathy without a tear.  She has had 2 cortisone injections and a course of physical therapy and is still having significant pain and compromise of her activities.  Having difficulty sleeping and raising her right arm overhead.  She would like to proceed with surgery  HPI  Review of Systems  Constitutional: Negative for activity change.  HENT: Negative for trouble swallowing.   Eyes: Negative for pain.  Respiratory: Negative for shortness of breath.   Cardiovascular:  Negative for leg swelling.  Gastrointestinal: Negative for constipation.  Endocrine: Negative for cold intolerance.  Genitourinary: Negative for difficulty urinating.  Musculoskeletal: Negative for neck pain.  Skin: Negative for rash.  Allergic/Immunologic: Negative for food allergies.  Neurological: Negative for numbness.  Hematological: Does not bruise/bleed easily.  Psychiatric/Behavioral: Positive for sleep disturbance.     Objective: Vital Signs: BP 109/62 (BP Location: Left Arm, Patient Position: Sitting, Cuff Size: Normal)   Pulse 76   Resp 14   Ht 5\' 7"  (1.702 m)   Wt 135 lb (61.2 kg)   BMI 21.14 kg/m   Physical Exam  Constitutional: She is oriented to person, place, and time. She appears well-developed and well-nourished.  HENT:  Head: Normocephalic and atraumatic.  Eyes: Pupils are equal, round, and reactive to light. EOM are normal.  Pulmonary/Chest: Effort normal.  Neurological: She is alert and oriented to person, place, and time.  Skin: Skin is warm and dry.  Psychiatric: She has a normal mood and affect. Her behavior is normal. Judgment and thought content normal.    Ortho Exam awake alert and oriented x3.  Comfortable sitting.  Pain directly over the acromioclavicular joint right shoulder with a positive crossarm test.  Overhead shoulder motion causes pain over the acromioclavicular joint.  Minimally positive impingement.  Biceps intact.  Skin intact.  Negative Speed sign.  Neurovascular exam intact.  No pain with range of motion of cervical spine  Specialty Comments:  No specialty comments available.  Imaging: No results found.   PMFS History: Patient Active Problem List   Diagnosis Date Noted  . Varicose veins of right lower extremity with complications 34/19/6222  . Paresthesias 07/29/2014  . Tremor 12/08/2013  . Personal history of breast cancer 09/06/2013  . Dense breast 09/06/2013  . Malignant neoplasm of lower-outer quadrant of right breast of  female, estrogen receptor positive ([facility]) 02/11/2012  . Mitral valve disorder 05/08/2009  . PREMATURE ATRIAL CONTRACTIONS 05/08/2009   Past Medical History:  Diagnosis Date  . Arthritis   . Breast cancer ([facility])    s/p lumpectomy  . Cataract   . MVP (mitral valve prolapse)   . PAC (premature atrial contraction)   . Palpitations   . Thyroid disease     Family History  Problem Relation Age of Onset  . Coronary artery disease Mother   . Hypertension Mother   . Osteoporosis Mother        wrist fracture  . Osteoarthritis Father   . Neuropathy Father   . Tremor Father   . Other Father        lymphedema  . Fibromyalgia Sister   . Heart disease Maternal Grandmother   . Colon polyps Neg Hx   . Esophageal cancer Neg Hx   . Pancreatic cancer Neg Hx   . Stomach cancer Neg Hx   . Rectal cancer Neg Hx     Past Surgical History:  Procedure Laterality Date  . BREAST BIOPSY  2009  . BREAST LUMPECTOMY    . HEMANGIOMA EXCISION Left 1990   arm  . MELANOMA EXCISION Right 1980   thigh  . NECK SURGERY  10/2009   C5-6 fusion  . TONSILLECTOMY     Social History   Occupational History  . Not on file  Tobacco Use  . Smoking status: Never Smoker  . Smokeless tobacco: Never Used  Substance and Sexual Activity  . Alcohol use: Yes    Alcohol/week: 0.0 oz    Comment: Socially  . Drug use: No  . Sexual activity: Not on file

## 2018-01-21 DIAGNOSIS — N3010 Interstitial cystitis (chronic) without hematuria: Secondary | ICD-10-CM | POA: Diagnosis not present

## 2018-01-21 DIAGNOSIS — Z87440 Personal history of urinary (tract) infections: Secondary | ICD-10-CM | POA: Diagnosis not present

## 2018-01-24 DIAGNOSIS — J029 Acute pharyngitis, unspecified: Secondary | ICD-10-CM | POA: Diagnosis not present

## 2018-02-18 ENCOUNTER — Telehealth (INDEPENDENT_AMBULATORY_CARE_PROVIDER_SITE_OTHER): Payer: Self-pay | Admitting: Orthopaedic Surgery

## 2018-02-18 NOTE — Telephone Encounter (Signed)
Patient called stating she received information from the [facility] regarding her upcoming surgery.  Patient states it said to stop taking all Vitamin supplements 2 weeks prior to surgery.  Patient states she currently takes Fish oil, Vitamin D and Biotin and doesn't know if she should stop taking them.  Patient also stated that the information stated you can take all prescription medicines the morning of surgery.

## 2018-02-18 NOTE — Telephone Encounter (Signed)
Please advise 

## 2018-02-19 NOTE — Telephone Encounter (Signed)
Notified pt to stop taking the fish oil, vitamin d and biotin 2 weeks prior to the surgery

## 2018-02-19 NOTE — Telephone Encounter (Signed)
Stop the 3 mentioned supplements 2 weeks prior to surgery

## 2018-03-03 DIAGNOSIS — C44519 Basal cell carcinoma of skin of other part of trunk: Secondary | ICD-10-CM | POA: Diagnosis not present

## 2018-03-03 DIAGNOSIS — D225 Melanocytic nevi of trunk: Secondary | ICD-10-CM | POA: Diagnosis not present

## 2018-03-03 DIAGNOSIS — L821 Other seborrheic keratosis: Secondary | ICD-10-CM | POA: Diagnosis not present

## 2018-03-03 DIAGNOSIS — L723 Sebaceous cyst: Secondary | ICD-10-CM | POA: Diagnosis not present

## 2018-03-03 DIAGNOSIS — L819 Disorder of pigmentation, unspecified: Secondary | ICD-10-CM | POA: Diagnosis not present

## 2018-03-03 DIAGNOSIS — D485 Neoplasm of uncertain behavior of skin: Secondary | ICD-10-CM | POA: Diagnosis not present

## 2018-03-03 DIAGNOSIS — L814 Other melanin hyperpigmentation: Secondary | ICD-10-CM | POA: Diagnosis not present

## 2018-03-03 DIAGNOSIS — Z85820 Personal history of malignant melanoma of skin: Secondary | ICD-10-CM | POA: Diagnosis not present

## 2018-03-03 DIAGNOSIS — D2272 Melanocytic nevi of left lower limb, including hip: Secondary | ICD-10-CM | POA: Diagnosis not present

## 2018-03-03 DIAGNOSIS — D2261 Melanocytic nevi of right upper limb, including shoulder: Secondary | ICD-10-CM | POA: Diagnosis not present

## 2018-03-03 DIAGNOSIS — D1801 Hemangioma of skin and subcutaneous tissue: Secondary | ICD-10-CM | POA: Diagnosis not present

## 2018-03-04 ENCOUNTER — Encounter: Payer: Self-pay | Admitting: Orthopaedic Surgery

## 2018-03-04 DIAGNOSIS — G8918 Other acute postprocedural pain: Secondary | ICD-10-CM | POA: Diagnosis not present

## 2018-03-04 DIAGNOSIS — M7581 Other shoulder lesions, right shoulder: Secondary | ICD-10-CM | POA: Diagnosis not present

## 2018-03-04 DIAGNOSIS — M19011 Primary osteoarthritis, right shoulder: Secondary | ICD-10-CM | POA: Diagnosis not present

## 2018-03-04 DIAGNOSIS — M7541 Impingement syndrome of right shoulder: Secondary | ICD-10-CM | POA: Diagnosis not present

## 2018-03-04 DIAGNOSIS — M19071 Primary osteoarthritis, right ankle and foot: Secondary | ICD-10-CM | POA: Diagnosis not present

## 2018-03-12 DIAGNOSIS — Z85820 Personal history of malignant melanoma of skin: Secondary | ICD-10-CM | POA: Diagnosis not present

## 2018-03-12 DIAGNOSIS — Z85828 Personal history of other malignant neoplasm of skin: Secondary | ICD-10-CM | POA: Diagnosis not present

## 2018-03-12 DIAGNOSIS — C44519 Basal cell carcinoma of skin of other part of trunk: Secondary | ICD-10-CM | POA: Diagnosis not present

## 2018-03-15 ENCOUNTER — Ambulatory Visit (INDEPENDENT_AMBULATORY_CARE_PROVIDER_SITE_OTHER): Payer: PPO | Admitting: Orthopaedic Surgery

## 2018-03-15 ENCOUNTER — Encounter (INDEPENDENT_AMBULATORY_CARE_PROVIDER_SITE_OTHER): Payer: Self-pay | Admitting: Orthopaedic Surgery

## 2018-03-15 VITALS — BP 101/53 | HR 85 | Resp 14 | Ht 68.0 in | Wt 136.0 lb

## 2018-03-15 DIAGNOSIS — G8929 Other chronic pain: Secondary | ICD-10-CM

## 2018-03-15 DIAGNOSIS — M25511 Pain in right shoulder: Secondary | ICD-10-CM

## 2018-03-15 NOTE — Progress Notes (Signed)
Office Visit Note   Patient: Kellie Humphrey           Date of Birth: 11-Oct-1945           MRN: [id number] Visit Date: 03/15/2018              Requested by: Marton Redwood, MD [address] [place], [place] [address] PCP: Marton Redwood, MD   Assessment & Plan: Visit Diagnoses:  1. Chronic right shoulder pain     Plan: 11 days status post right shoulder arthroscopic SCD and DCR.  Doing quite well.  Taking ibuprofen for pain.  [place] and will be performing range of motion exercises.  We will reevaluate in 3 weeks.  Have instructed on range of motion exercises  Follow-Up Instructions: Return in about 3 weeks (around 04/05/2018).   Orders:  No orders of the defined types were placed in this encounter.  No orders of the defined types were placed in this encounter.     Procedures: No procedures performed   Clinical Data: No additional findings.   Subjective: Chief Complaint  Patient presents with  . Right Shoulder - Routine Post Op  . Post-op Follow-up    Right shoulder arthroscopy 03/04/18, doing good, IBU helps    HPI  Review of Systems  Constitutional: Negative for fatigue.  HENT: Negative for trouble swallowing.   Eyes: Negative for pain.  Respiratory: Negative for shortness of breath.   Cardiovascular: Negative for leg swelling.  Gastrointestinal: Negative for constipation.  Endocrine: Negative for cold intolerance.  Genitourinary: Negative for difficulty urinating.  Musculoskeletal: Positive for neck pain and neck stiffness.  Skin: Negative for rash.  Neurological: Negative for weakness.  Hematological: Does not bruise/bleed easily.  Psychiatric/Behavioral: Negative for sleep disturbance.     Objective: Vital Signs: BP (!) 101/53 (BP Location: Left Arm, Patient Position: Sitting, Cuff Size: Normal)   Pulse 85   Resp 14   Ht 5\' 8"  (1.727 m)   Wt 136 lb (61.7 kg)   BMI 20.68 kg/m   Physical Exam  Ortho Exam awake alert and  oriented x3 comfortable sitting.  Arthroscopic portals are healing without problem.  Ethilon stitches removed from the anterior and lateral portals.  Unable to abduct 90 degrees and flex about 120 degrees.  Mild discomfort.  No swelling.  No ecchymosis.  Neurovascular exam intact  Specialty Comments:  No specialty comments available.  Imaging: No results found.   PMFS History: Patient Active Problem List   Diagnosis Date Noted  . Varicose veins of right lower extremity with complications 07/23/8526  . Paresthesias 07/29/2014  . Tremor 12/08/2013  . Personal history of breast cancer 09/06/2013  . Dense breast 09/06/2013  . Malignant neoplasm of lower-outer quadrant of right breast of female, estrogen receptor positive ([facility]) 02/11/2012  . Mitral valve disorder 05/08/2009  . PREMATURE ATRIAL CONTRACTIONS 05/08/2009   Past Medical History:  Diagnosis Date  . Arthritis   . Breast cancer ([facility])    s/p lumpectomy  . Cataract   . MVP (mitral valve prolapse)   . PAC (premature atrial contraction)   . Palpitations   . Thyroid disease     Family History  Problem Relation Age of Onset  . Coronary artery disease Mother   . Hypertension Mother   . Osteoporosis Mother        wrist fracture  . Osteoarthritis Father   . Neuropathy Father   . Tremor Father   . Other Father  lymphedema  . Fibromyalgia Sister   . Heart disease Maternal Grandmother   . Colon polyps Neg Hx   . Esophageal cancer Neg Hx   . Pancreatic cancer Neg Hx   . Stomach cancer Neg Hx   . Rectal cancer Neg Hx     Past Surgical History:  Procedure Laterality Date  . BREAST BIOPSY  2009  . BREAST LUMPECTOMY    . HEMANGIOMA EXCISION Left 1990   arm  . MELANOMA EXCISION Right 1980   thigh  . NECK SURGERY  10/2009   C5-6 fusion  . SHOULDER ARTHROSCOPY    . TONSILLECTOMY     Social History   Occupational History  . Not on file  Tobacco Use  . Smoking status: Never Smoker  . Smokeless tobacco: Never  Used  Substance and Sexual Activity  . Alcohol use: Yes    Comment: Socially  . Drug use: No  . Sexual activity: Not on file

## 2018-04-08 ENCOUNTER — Ambulatory Visit (INDEPENDENT_AMBULATORY_CARE_PROVIDER_SITE_OTHER): Payer: PPO | Admitting: Orthopaedic Surgery

## 2018-04-08 ENCOUNTER — Other Ambulatory Visit (INDEPENDENT_AMBULATORY_CARE_PROVIDER_SITE_OTHER): Payer: Self-pay | Admitting: Radiology

## 2018-04-08 ENCOUNTER — Encounter (INDEPENDENT_AMBULATORY_CARE_PROVIDER_SITE_OTHER): Payer: Self-pay | Admitting: Orthopaedic Surgery

## 2018-04-08 VITALS — BP 97/63 | HR 82 | Resp 14 | Ht 68.0 in | Wt 137.0 lb

## 2018-04-08 DIAGNOSIS — G8929 Other chronic pain: Secondary | ICD-10-CM

## 2018-04-08 DIAGNOSIS — M25511 Pain in right shoulder: Secondary | ICD-10-CM

## 2018-04-08 MED ORDER — DICLOFENAC SODIUM 1 % TD GEL: TRANSDERMAL | 2 refills | Status: DC

## 2018-04-08 NOTE — Progress Notes (Signed)
Office Visit Note   Patient: Kellie Humphrey           Date of Birth: 04-20-1946           MRN: [id number] Visit Date: 04/08/2018              Requested by: Marton Redwood, MD [address] [place], [place] [address] PCP: Marton Redwood, MD   Assessment & Plan: Visit Diagnoses:  1. Chronic right shoulder pain     Plan: 5 weeks status post arthroscopic subacromial decompression distal clavicle resection.  Having some persistent pain in the area of the distal clavicle.  Injected the surgery with Xylocaine and prednisone with immediate relief of her pain.  Hopefully this is just some scar tissue inflammatory response.  Reevaluate in 1 month.  Voltaren gel.  Continue with exercises  Follow-Up Instructions: Return in about 1 month (around 05/09/2018).   Orders:  No orders of the defined types were placed in this encounter.  No orders of the defined types were placed in this encounter.     Procedures: No procedures performed   Clinical Data: No additional findings.   Subjective: Chief Complaint  Patient presents with  . Right Shoulder - Follow-up  . Shoulder Pain    Right shoulder pain since 03/04/2018 arthroscopic surgery, IBU, not diabetic  5 weeks status post arthroscopic subacromial decompression and distal clavicle resection right shoulder.  Has regained full range of motion.  Having some of similar preoperative symptoms in the area of the distal clavicle where she was noted to have degenerative changes and some edema of the distal clavicle.  No numbness or tingling.  No loss of motion.  HPI  Review of Systems   Objective: Vital Signs: BP 97/63 (BP Location: Left Arm, Patient Position: Sitting, Cuff Size: Normal)   Pulse 82   Resp 14   Ht 5\' 8"  (1.727 m)   Wt 137 lb (62.1 kg)   BMI 20.83 kg/m   Physical Exam  Ortho Exam awake alert and oriented x3.  Comfortable sitting.  Local tenderness at the very end of the distal clavicle superiorly.  Skin intact..  Full  range of motion.  Some pain with crossarm motion at the distal clavicle.  Symptoms totally relieved with local cortisone and Xylocaine injection today  Specialty Comments:  No specialty comments available.  Imaging: No results found.   PMFS History: Patient Active Problem List   Diagnosis Date Noted  . Varicose veins of right lower extremity with complications 95/18/8416  . Paresthesias 07/29/2014  . Tremor 12/08/2013  . Personal history of breast cancer 09/06/2013  . Dense breast 09/06/2013  . Malignant neoplasm of lower-outer quadrant of right breast of female, estrogen receptor positive ([facility]) 02/11/2012  . Mitral valve disorder 05/08/2009  . PREMATURE ATRIAL CONTRACTIONS 05/08/2009   Past Medical History:  Diagnosis Date  . Arthritis   . Breast cancer ([facility])    s/p lumpectomy  . Cataract   . MVP (mitral valve prolapse)   . PAC (premature atrial contraction)   . Palpitations   . Thyroid disease     Family History  Problem Relation Age of Onset  . Coronary artery disease Mother   . Hypertension Mother   . Osteoporosis Mother        wrist fracture  . Osteoarthritis Father   . Neuropathy Father   . Tremor Father   . Other Father        lymphedema  . Fibromyalgia Sister   .  Heart disease Maternal Grandmother   . Colon polyps Neg Hx   . Esophageal cancer Neg Hx   . Pancreatic cancer Neg Hx   . Stomach cancer Neg Hx   . Rectal cancer Neg Hx     Past Surgical History:  Procedure Laterality Date  . BREAST BIOPSY  2009  . BREAST LUMPECTOMY    . HEMANGIOMA EXCISION Left 1990   arm  . MELANOMA EXCISION Right 1980   thigh  . NECK SURGERY  10/2009   C5-6 fusion  . SHOULDER ARTHROSCOPY    . TONSILLECTOMY     Social History   Occupational History  . Not on file  Tobacco Use  . Smoking status: Never Smoker  . Smokeless tobacco: Never Used  Substance and Sexual Activity  . Alcohol use: Yes    Comment: Socially  . Drug use: No  . Sexual activity: Not on file       Garald Balding, MD   Note - This record has been created using [employer].  Chart creation errors have been sought, but may not always  have been located. Such creation errors do not reflect on  the standard of medical care.

## 2018-04-14 ENCOUNTER — Other Ambulatory Visit (HOSPITAL_COMMUNITY): Payer: Self-pay | Admitting: Internal Medicine

## 2018-04-14 ENCOUNTER — Telehealth: Payer: Self-pay

## 2018-04-14 NOTE — Telephone Encounter (Signed)
Pt called wanting a refill on her metoprolol.  Pt has not been seen by our practice, pt has appt to see Dr. [name] in September, per my supervisor we can not fill medication until pt is seen in our office.

## 2018-04-15 MED ORDER — METOPROLOL TARTRATE 25 MG PO TABS: 25.0000 mg | ORAL_TABLET | Freq: Two times a day (BID) | ORAL | 3 refills | Status: DC

## 2018-04-15 NOTE — Telephone Encounter (Signed)
Refill sent in per Dr Haroldine Laws, pt is aware

## 2018-04-15 NOTE — Addendum Note (Signed)
Addended by: Scarlette Calico on: 04/15/2018 03:35 PM   Modules accepted: Orders

## 2018-04-15 NOTE — Telephone Encounter (Signed)
This is Dr. Bensimhon's pt 

## 2018-05-14 ENCOUNTER — Encounter (INDEPENDENT_AMBULATORY_CARE_PROVIDER_SITE_OTHER): Payer: Self-pay | Admitting: Orthopaedic Surgery

## 2018-05-14 ENCOUNTER — Ambulatory Visit (INDEPENDENT_AMBULATORY_CARE_PROVIDER_SITE_OTHER): Payer: PPO | Admitting: Orthopaedic Surgery

## 2018-05-14 VITALS — BP 91/64 | HR 72 | Ht 68.0 in | Wt 138.0 lb

## 2018-05-14 DIAGNOSIS — M25511 Pain in right shoulder: Secondary | ICD-10-CM

## 2018-05-14 DIAGNOSIS — G8929 Other chronic pain: Secondary | ICD-10-CM

## 2018-05-14 NOTE — Progress Notes (Signed)
Office Visit Note   Patient: Kellie Humphrey           Date of Birth: 07/22/1946           MRN: [id number] Visit Date: 05/14/2018              Requested by: Marton Redwood, MD [address] [place], [place] [address] PCP: Marton Redwood, MD   Assessment & Plan: Visit Diagnoses:  1. Chronic right shoulder pain     Plan: 10 weeks status post right shoulder arthroscopic debridement including SCD and DCR.  Had some discomfort in the area of the distal clavicle resection a month ago.  I injected that area with cortisone with relief of her pain.  She is had some recurrence but better than she was.  Continue with her exercises.  She has full range of motion.  Would like to reevaluate in a month and consider second injection  Follow-Up Instructions: Return in about 1 month (around 06/14/2018).   Orders:  No orders of the defined types were placed in this encounter.  No orders of the defined types were placed in this encounter.     Procedures: No procedures performed   Clinical Data: No additional findings.   Subjective: Chief Complaint  Patient presents with  . Follow-up    04/03/18 R SHOULDER ARTH. 2 MO F/U  AT TIMES THE SHOULDER CATCHES AND HAS SOME PAIN BUT CAN SLOWLY WORK IT OUT. HAD CORTISON INJ 04/08/18 WHICH HELPED USING VOLTAREN GEL  10 weeks status post right shoulder arthroscopic debridement.  Doing relatively well and better than she was a month ago.  Still having some discomfort with certain activities.  Occasionally will have some trouble sleeping.  Cortisone injection in the area of the [facility] joint resection was helpful a month ago.  Using Voltaren gel.  Certainly better than she was preoperatively  HPI  Review of Systems  Constitutional: Negative for fatigue and fever.  HENT: Negative for ear pain.   Eyes: Negative for pain.  Respiratory: Negative for cough and shortness of breath.   Cardiovascular: Negative for leg swelling.  Gastrointestinal: Negative for  constipation and diarrhea.  Genitourinary: Negative for difficulty urinating.  Musculoskeletal: Negative for back pain and neck pain.  Skin: Negative for rash.  Allergic/Immunologic: Negative for food allergies.  Neurological: Positive for weakness. Negative for numbness.  Hematological: Does not bruise/bleed easily.  Psychiatric/Behavioral: Negative for sleep disturbance.     Objective: Vital Signs: BP 91/64 (BP Location: Left Arm, Patient Position: Sitting, Cuff Size: Normal)   Pulse 72   Ht 5\' 8"  (1.727 m)   Wt 138 lb (62.6 kg)   BMI 20.98 kg/m   Physical Exam  Ortho Exam awake alert and oriented x3.  Comfortable sitting.  Able to quickly place right arm overhead.  Negative impingement.  Little bit of discomfort in the area of the distal clavicle resection strength.  Skin intact.  Biceps intact. Specialty Comments:  No specialty comments available.  Imaging: No results found.   PMFS History: Patient Active Problem List   Diagnosis Date Noted  . Varicose veins of right lower extremity with complications 94/70/9628  . Paresthesias 07/29/2014  . Tremor 12/08/2013  . Personal history of breast cancer 09/06/2013  . Dense breast 09/06/2013  . Malignant neoplasm of lower-outer quadrant of right breast of female, estrogen receptor positive ([facility]) 02/11/2012  . Mitral valve disorder 05/08/2009  . PREMATURE ATRIAL CONTRACTIONS 05/08/2009   Past Medical History:  Diagnosis Date  .  Arthritis   . Breast cancer ([facility])    s/p lumpectomy  . Cataract   . MVP (mitral valve prolapse)   . PAC (premature atrial contraction)   . Palpitations   . Thyroid disease     Family History  Problem Relation Age of Onset  . Coronary artery disease Mother   . Hypertension Mother   . Osteoporosis Mother        wrist fracture  . Osteoarthritis Father   . Neuropathy Father   . Tremor Father   . Other Father        lymphedema  . Fibromyalgia Sister   . Heart disease Maternal Grandmother     . Colon polyps Neg Hx   . Esophageal cancer Neg Hx   . Pancreatic cancer Neg Hx   . Stomach cancer Neg Hx   . Rectal cancer Neg Hx     Past Surgical History:  Procedure Laterality Date  . BREAST BIOPSY  2009  . BREAST LUMPECTOMY    . HEMANGIOMA EXCISION Left 1990   arm  . MELANOMA EXCISION Right 1980   thigh  . NECK SURGERY  10/2009   C5-6 fusion  . SHOULDER ARTHROSCOPY    . TONSILLECTOMY     Social History   Occupational History  . Not on file  Tobacco Use  . Smoking status: Never Smoker  . Smokeless tobacco: Never Used  Substance and Sexual Activity  . Alcohol use: Yes    Comment: Socially  . Drug use: No  . Sexual activity: Not on file

## 2018-06-02 ENCOUNTER — Encounter: Payer: Self-pay | Admitting: Cardiology

## 2018-06-04 DIAGNOSIS — G43B0 Ophthalmoplegic migraine, not intractable: Secondary | ICD-10-CM | POA: Diagnosis not present

## 2018-06-08 ENCOUNTER — Telehealth: Payer: Self-pay | Admitting: Cardiology

## 2018-06-08 NOTE — Telephone Encounter (Signed)
Spoke with Scheduling Supervisor Jari Sportsman, and ok to offer the pt to see Melina Copa PA-C for tomorrow 9/11 at 2 pm.   This will be considered an established pt, being she saw Dr Haroldine Laws last year, on 05/22/17.  She just needs to establish full time with [facility], but is not a new pt, she is an established pt.   Pt made aware of appt date and time.  Pt aware to arrive 15 mins prior to this appt.   Informed the pt that I will keep her 9/19 appt there with Dr [name], and Melina Copa PA-C can advise if she needs to cancel this appt, or keep this as scheduled, at her visit with the pt tomorrow.   Pt verbalized understanding and agrees with this plan.  Pt more than gracious for all the assistance provided.   Will send this to Dr [name] as a general [place].

## 2018-06-08 NOTE — Telephone Encounter (Signed)
Pt is calling in to move her appt up with Dr [name] or another Provider in our office, for tomorrow if possible.  Pt states that she was an established pt of  Dr. Haroldine Laws and was released by him (for he is only following CHF patients), and was advised to schedule an appt with our office, to establish care, for a history of MVP and history of PACs.   Pt was scheduled back in May 2019, to see Dr [name] on 9/19, as a one year follow-up.  Pt has never seen Dr [name], and will be considered a new pt, for she has been followed by Dr Haroldine Laws, which has recently released her to establish care with [facility].   Pt states she has an appointment to meet Dr [name] for next Thursday 9/19, but, she would like to see someone sooner if possible, for she is having palpitations more frequently, especially after exerting and/or exercising.   Pt states she does have a history of PACs, and has infrequently had them occur in the past, but over the last couple days, they have been more consistent in nature.   Pt states she has no chest pain, sob, doe, pre-syncopal/syncopal episodes when these events occur.  Pt states she has no dizziness or lightheadedness when these episodes occur.   Pt states she is taking metoprolol tartrate 25 mg po bid.  Pt states she is an avid walker, and exercises daily, but her "infrequent beats," have interfered with daily exercise regimen.    Pt states "I don't think I can wait until next Thursday to be seen."  Informed the pt that I will call her back to see if we can obtain her a new pt consult appt with an APP, for tomorrow or early this week, for complaints mentioned.   Pt verbalized understanding and agrees with this plan.

## 2018-06-08 NOTE — Telephone Encounter (Signed)
New Message:    Patent is requesting a call back, she states she has some concerns and question prior to appt 06/17/18

## 2018-06-09 ENCOUNTER — Ambulatory Visit (INDEPENDENT_AMBULATORY_CARE_PROVIDER_SITE_OTHER): Payer: PPO

## 2018-06-09 ENCOUNTER — Encounter: Payer: Self-pay | Admitting: Medical

## 2018-06-09 ENCOUNTER — Ambulatory Visit: Payer: PPO | Admitting: Medical

## 2018-06-09 ENCOUNTER — Other Ambulatory Visit: Payer: PPO | Admitting: *Deleted

## 2018-06-09 ENCOUNTER — Other Ambulatory Visit: Payer: Self-pay | Admitting: Medical

## 2018-06-09 VITALS — BP 102/64 | HR 80 | Ht 68.0 in | Wt 137.4 lb

## 2018-06-09 DIAGNOSIS — R002 Palpitations: Secondary | ICD-10-CM

## 2018-06-09 DIAGNOSIS — I340 Nonrheumatic mitral (valve) insufficiency: Secondary | ICD-10-CM | POA: Diagnosis not present

## 2018-06-09 DIAGNOSIS — I341 Nonrheumatic mitral (valve) prolapse: Secondary | ICD-10-CM

## 2018-06-09 DIAGNOSIS — I491 Atrial premature depolarization: Secondary | ICD-10-CM

## 2018-06-09 MED ORDER — METOPROLOL TARTRATE 25 MG PO TABS: 25.0000 mg | ORAL_TABLET | Freq: Two times a day (BID) | ORAL | 3 refills | Status: DC

## 2018-06-09 NOTE — Progress Notes (Addendum)
Cardiology Office Note:    Date:  06/09/2018   ID:  Kellie Humphrey, DOB [birth year], MRN [id number]  PCP:  Marton Redwood, MD  Cardiologist:  Ena Dawley, MD; formerly Dr. Haroldine Laws  Referring MD: Marton Redwood, MD   Chief complaint: establish care with [facility] for mitral valve prolapse  History of Present Illness:    Kellie Humphrey is a 72 y.o. female with a PMH of mitral valve prolapse with mild-moderate MR on last echo 04/2017, PACs, breast cancer s/p lumpectomy and radiation in 2009, interstitial cystitis, and chronic cystitis on macrobid. She previously followed outpatient with Dr. Haroldine Laws for MVP/MR and PAC history, however at her last visit, she was recommended to establish care with [facility] going forward.   She presents today to establish care. She reports that her palpitations had been relatively stable up until 2 weeks ago when she noticed an increase in frequency. She also reports an increased in occular migraines around the same time. She states the skipped beats would draw her attention while she was resting and would last for a few seconds and go away spontaneously. These episodes occur multiple times per day. No associated SOB, chest pain, dizziness, lightheadedness, or syncope. She continues to be quite active, walking 4 miles per day, and does not appreciate palpitations with activity. She denies fatigue, DOE, or LE edema.  Past Medical History:  Diagnosis Date  . Arthritis   . Breast cancer ([place])    s/p lumpectomy  . Cataract   . MVP (mitral valve prolapse)   . PAC (premature atrial contraction)   . Palpitations   . Thyroid disease     Past Surgical History:  Procedure Laterality Date  . BREAST BIOPSY  2009  . BREAST LUMPECTOMY    . HEMANGIOMA EXCISION Left 1990   arm  . MELANOMA EXCISION Right 1980   thigh  . NECK SURGERY  10/2009   C5-6 fusion  . SHOULDER ARTHROSCOPY    . TONSILLECTOMY      Current Medications: Current Meds    Medication Sig  . amitriptyline (ELAVIL) 50 MG tablet Take 50 mg by mouth at bedtime as needed for sleep.  . Biotin 5000 MCG CAPS Take 5,000 mcg by mouth daily.    . cholecalciferol (VITAMIN D) 1000 UNITS tablet Take 2,000 Units by mouth daily. Taking 3 Days a Week  . Cyanocobalamin (VITAMIN B12 PO) Take by mouth.  . diclofenac sodium (VOLTAREN) 1 % GEL APPLY 2-4 GRAMS TO LARGE JOINT AREA UP TO FOUR TIMES A DAY AS NEEDED  . fish oil-omega-3 fatty acids 1000 MG capsule Take 2 g by mouth daily.    [name] gabapentin (NEURONTIN) 300 MG capsule Take 900 mg by mouth 2 (two) times daily.   [name] levothyroxine (SYNTHROID) 50 MCG tablet Take 1 tablet (50 mcg total) by mouth daily before breakfast.  . metoprolol tartrate (LOPRESSOR) 25 MG tablet Take 1 tablet (25 mg total) by mouth 2 (two) times daily.  . nitrofurantoin (MACRODANTIN) 50 MG capsule Take 1 capsule (50 mg total) by mouth daily.  . [DISCONTINUED] metoprolol tartrate (LOPRESSOR) 25 MG tablet Take 1 tablet (25 mg total) by mouth 2 (two) times daily.     Allergies:   Codeine   Social History   Socioeconomic History  . Marital status: Married    Spouse name: [name]  . Number of children: 2  . Years of education: Bachelor  . Highest education level: Not on file  Occupational History  . Not  on file  Social Needs  . Financial resource strain: Not on file  . Food insecurity:    Worry: Not on file    Inability: Not on file  . Transportation needs:    Medical: Not on file    Non-medical: Not on file  Tobacco Use  . Smoking status: Never Smoker  . Smokeless tobacco: Never Used  Substance and Sexual Activity  . Alcohol use: Yes    Comment: Socially  . Drug use: No  . Sexual activity: Not on file  Lifestyle  . Physical activity:    Days per week: Not on file    Minutes per session: Not on file  . Stress: Not on file  Relationships  . Social connections:    Talks on phone: Not on file    Gets together: Not on file    Attends religious  service: Not on file    Active member of club or organization: Not on file    Attends meetings of clubs or organizations: Not on file    Relationship status: Not on file  Other Topics Concern  . Not on file  Social History Narrative   Patient is married to [name], has 2 children   Patient is right handed   Education level is Bachelor    Caffeine consumption is none           Family History: The patient's family history includes Coronary artery disease in her mother; Fibromyalgia in her sister; Heart disease in her maternal grandmother; Hypertension in her mother; Neuropathy in her father; Osteoarthritis in her father; Osteoporosis in her mother; Other in her father; Tremor in her father. There is no history of Colon polyps, Esophageal cancer, Pancreatic cancer, Stomach cancer, or Rectal cancer.  ROS:   Please see the history of present illness.    All other systems reviewed and are negative.  EKGs/Labs/Other Studies Reviewed:    The following studies were reviewed today: Echocardiogram 04/2018: Study Conclusions  - Left ventricle: The cavity size was normal. Systolic function was   normal. The estimated ejection fraction was in the range of 55%   to 60%. Wall motion was normal; there were no regional wall   motion abnormalities. - Mitral valve: Myxomatous leaflets with mild bileaflet prolapse.   Prolapse. There was mild to moderate regurgitation. Valve area by   pressure half-time: 1.93 cm^2. Valve area by continuity equation   (using LVOT flow): 1.34 cm^2. - Atrial septum: No defect or patent foramen ovale was identified.  EKG:  EKG is  ordered today and demonstrates normal sinus rhythm; non-ischemic, no evidence of PACs/PVCs; QTC 442.   Recent Labs: 12/09/2017: ALT 11; BUN 10; Creatinine, Ser 0.81; Hemoglobin 12.3; Platelets 185; Potassium 3.9; Sodium 142  Recent Lipid Panel No results found for: CHOL, TRIG, HDL, CHOLHDL, VLDL, LDLCALC, LDLDIRECT  Physical Exam:    VS:   BP 102/64   Pulse 80   Ht 5\' 8"  (1.727 m)   Wt 137 lb 6.4 oz (62.3 kg)   BMI 20.89 kg/m     Wt Readings from Last 3 Encounters:  06/09/18 137 lb 6.4 oz (62.3 kg)  05/14/18 138 lb (62.6 kg)  04/08/18 137 lb (62.1 kg)     GEN:  Well nourished, well developed female sitting on the exam table in no acute distress HEENT: Sclera anicteric NECK: No JVD; No carotid bruits CARDIAC: RRR, no murmurs, rubs, gallops RESPIRATORY:  Clear to auscultation without rales, wheezing or rhonchi  ABDOMEN: Soft,  non-tender, non-distended MUSCULOSKELETAL:  No edema; No deformity  SKIN: Warm and dry NEUROLOGIC:  Alert and oriented x 3 PSYCHIATRIC:  Normal affect   ASSESSMENT/PLAN:    1. PACs/palpitations: occurring with increasing frequency over the past 2 weeks. Presumed palpitations are 2/2 to PACs.  - Will plan for a 48-hour holter monitor to further characterize palpitations - Will check BMET and Mg today to monitor electrolytes - Will check a TSH/FT4 - Continue metoprolol 25mg  BID - BP soft limiting titration. Could consider alternative BBlocker if holter monitor benign and she is still feeling symptomatic  2. Mitral valve prolapse with mild-moderate MR: last echo 04/2017 with stable MR and no evidence of pulmonary hypertension or LAE or LVE. Recommended for yearly echo at last visit with Dr. Haroldine Laws. She is without complaints of fatigue, DOE, orthopnea, or LE edema - Will repeat an echo at this time.   Will plan to have her follow-up with Dr. [name] in 3-4 months.    Medication Adjustments/Labs and Tests Ordered: Current medicines are reviewed at length with the patient today.  Concerns regarding medicines are outlined above.  Orders Placed This Encounter  Procedures  . Basic metabolic panel  . Magnesium  . TSH  . T4, free  . HOLTER MONITOR - 48 HOUR  . EKG 12-Lead  . ECHOCARDIOGRAM COMPLETE   Meds ordered this encounter  Medications  . metoprolol tartrate (LOPRESSOR) 25 MG tablet     Sig: Take 1 tablet (25 mg total) by mouth 2 (two) times daily.    Dispense:  180 tablet    Refill:  3    Patient Instructions  Your physician recommends that you continue on your current medications as directed. Please refer to the Current Medication list given to you today.  Your physician recommends that you return for lab work in: today (bmet, [place], tsh, free t4)  Your physician has requested that you have an echocardiogram. Echocardiography is a painless test that uses sound waves to create images of your heart. It provides your doctor with information about the size and shape of your heart and how well your heart's chambers and valves are working. This procedure takes approximately one hour. There are no restrictions for this procedure.  Your physician has recommended that you wear a holter monitor. Holter monitors are medical devices that record the heart's electrical activity. Doctors most often use these monitors to diagnose arrhythmias. Arrhythmias are problems with the speed or rhythm of the heartbeat. The monitor is a small, portable device. You can wear one while you do your normal daily activities. This is usually used to diagnose what is causing palpitations/syncope (passing out).  Your physician recommends that you schedule a follow-up appointment in: 3-4 months with Dr. [name].     Signed, Abigail Butts, PA-C  06/09/2018 2:57 PM    [facility]

## 2018-06-09 NOTE — Patient Instructions (Addendum)
Your physician recommends that you continue on your current medications as directed. Please refer to the Current Medication list given to you today.  Your physician recommends that you return for lab work in: today (bmet, [place], tsh, free t4)  Your physician has requested that you have an echocardiogram. Echocardiography is a painless test that uses sound waves to create images of your heart. It provides your doctor with information about the size and shape of your heart and how well your heart's chambers and valves are working. This procedure takes approximately one hour. There are no restrictions for this procedure.  Your physician has recommended that you wear a holter monitor. Holter monitors are medical devices that record the heart's electrical activity. Doctors most often use these monitors to diagnose arrhythmias. Arrhythmias are problems with the speed or rhythm of the heartbeat. The monitor is a small, portable device. You can wear one while you do your normal daily activities. This is usually used to diagnose what is causing palpitations/syncope (passing out).  Your follow up is scheduled with Cecilie Kicks, NP on 06/24/18 at 9:00 am.

## 2018-06-10 LAB — MAGNESIUM: Magnesium: 2.2 mg/dL (ref 1.6–2.3)

## 2018-06-10 LAB — BASIC METABOLIC PANEL
BUN/Creatinine Ratio: 16 (ref 12–28)
BUN: 12 mg/dL (ref 8–27)
CO2: 25 mmol/L (ref 20–29)
CREATININE: 0.75 mg/dL (ref 0.57–1.00)
Calcium: 9.4 mg/dL (ref 8.7–10.3)
Chloride: 102 mmol/L (ref 96–106)
GFR calc Af Amer: 93 mL/min/{1.73_m2} (ref >59)
GFR, EST NON AFRICAN AMERICAN: 81 mL/min/{1.73_m2} (ref >59)
Glucose: 89 mg/dL (ref 65–99)
Potassium: 4 mmol/L (ref 3.5–5.2)
SODIUM: 141 mmol/L (ref 134–144)

## 2018-06-10 LAB — TSH: TSH: 1.19 u[IU]/mL (ref 0.450–4.500)

## 2018-06-10 LAB — T4, FREE: Free T4: 1.52 ng/dL (ref 0.82–1.77)

## 2018-06-10 NOTE — Progress Notes (Signed)
Pt has been made aware of normal result and verbalized understanding.  jw 06/10/18

## 2018-06-17 ENCOUNTER — Ambulatory Visit: Payer: PPO | Admitting: Cardiology

## 2018-06-17 ENCOUNTER — Other Ambulatory Visit: Payer: Self-pay

## 2018-06-17 ENCOUNTER — Ambulatory Visit (HOSPITAL_COMMUNITY): Payer: PPO | Attending: Cardiology

## 2018-06-17 DIAGNOSIS — I340 Nonrheumatic mitral (valve) insufficiency: Secondary | ICD-10-CM

## 2018-06-17 DIAGNOSIS — I491 Atrial premature depolarization: Secondary | ICD-10-CM | POA: Diagnosis not present

## 2018-06-17 DIAGNOSIS — E079 Disorder of thyroid, unspecified: Secondary | ICD-10-CM | POA: Diagnosis not present

## 2018-06-17 DIAGNOSIS — I081 Rheumatic disorders of both mitral and tricuspid valves: Secondary | ICD-10-CM | POA: Insufficient documentation

## 2018-06-17 DIAGNOSIS — R002 Palpitations: Secondary | ICD-10-CM | POA: Diagnosis not present

## 2018-06-17 DIAGNOSIS — I059 Rheumatic mitral valve disease, unspecified: Secondary | ICD-10-CM | POA: Diagnosis present

## 2018-06-18 ENCOUNTER — Telehealth: Payer: Self-pay | Admitting: *Deleted

## 2018-06-18 ENCOUNTER — Encounter: Payer: Self-pay | Admitting: *Deleted

## 2018-06-18 ENCOUNTER — Other Ambulatory Visit: Payer: Self-pay | Admitting: Cardiology

## 2018-06-18 DIAGNOSIS — I491 Atrial premature depolarization: Secondary | ICD-10-CM

## 2018-06-18 DIAGNOSIS — I059 Rheumatic mitral valve disease, unspecified: Secondary | ICD-10-CM

## 2018-06-18 MED ORDER — FLECAINIDE ACETATE 50 MG PO TABS: 50.0000 mg | ORAL_TABLET | Freq: Two times a day (BID) | ORAL | 1 refills | Status: DC

## 2018-06-18 NOTE — Telephone Encounter (Signed)
-----   Message from Dorothy Spark, MD sent at 06/18/2018  7:21 AM EDT ----- We could add flecainide 50 mg po BID given that her low BP doesn't allow uptitrating metoprolol. She would then require an exercise treadmill stress test within a week.

## 2018-06-18 NOTE — Telephone Encounter (Signed)
Informed the pt of her 48 hr holter monitor results and recommendations per Dr [name], for her to start taking Flecainide 50 mg po bid, and have a GXT done within a week of start of Flecainide.  Pt states she cannot start taking this med until 9/24, due to being out of town.  Confirmed the pharmacy of choice with the pt.  Advised the pt to start taking this on 06/22/18, and we will do her GXT on 06/28/18 at 2 pm.  Pt aware to arrive 15 mins prior to this appt. Advised the pt that when she comes for her GXT, she should HOLD her metoprolol that day, and continue taking her flecainide as directed.  Advised the pt to not eat or drink 4 hrs prior to her GXT, and wear comfortable tennis shoes and appropriate clothing.  Pt verbalized understanding and agrees with this plan.

## 2018-06-21 ENCOUNTER — Telehealth: Payer: Self-pay

## 2018-06-21 ENCOUNTER — Telehealth: Payer: Self-pay | Admitting: Medical

## 2018-06-21 NOTE — Telephone Encounter (Signed)
-----   Message from Abigail Butts, PA-C sent at 06/21/2018  1:01 PM EDT ----- Please notify the patient that her ultrasound of her heart showed a normal heart squeeze, but also some worsening of her known mitral regurgitation which may require additional testing. She can discuss these results further at her follow-up visit this Thursday, 06/24/18, with Cecilie Kicks. Thank you!

## 2018-06-21 NOTE — Telephone Encounter (Signed)
Notes recorded by Frederik Schmidt, RN on 06/21/2018 at 1:13 PM EDT Informed patient of results/recommendations. She verbalized understanding. ------

## 2018-06-23 NOTE — Telephone Encounter (Signed)
Error

## 2018-06-23 NOTE — H&P (View-Only) (Signed)
Cardiology Office Note   Date:  06/24/2018   ID:  Kellie Humphrey, DOB 1946-05-30, MRN [id number]  PCP:  Kellie Redwood, MD  Cardiologist:  Dr. Meda Humphrey  Was Dr. Haroldine Humphrey  Chief Complaint  Patient presents with  . Palpitations    PVCs and MR       History of Present Illness: Kellie Humphrey is a 72 y.o. female who presents for MR and need for TEE.  PMH of mitral valve prolapse with mild-moderate MR on last echo 04/2017, PACs, breast cancer s/p lumpectomy and radiation in 2009, interstitial cystitis, and chronic cystitis on macrobid. She previously followed outpatient with Dr. Haroldine Humphrey for MVP/MR and PAC history, however at her last visit, she was recommended to establish care with [facility] going forward.  She states the skipped beats would draw her attention while she was resting and would last for a few seconds and go away spontaneously. These episodes occur multiple times per day. No associated SOB, chest pain, dizziness, lightheadedness, or syncope.   She wore a monitor which revealed sinus brady to sinus tach, with freq PVCs.  + PACs and SVT with 15 beats.  Flecainide was started at 50 mg BID.   She is for ETT on Monday to eval QRS.  She has fewer PVCs at this point.    Echo with mod to severe MR. Last year it was mild to moderate MR.  EF is the same at 55-60%, G1DD,   Still without chest pain or SOB.  No lightheadedness.    Past Medical History:  Diagnosis Date  . Arthritis   . Breast cancer ([place] Humph[place])    s/p lumpectomy  . Cataract   . MVP (mitral valve prolapse)   . PAC (premature atrial contraction)   . Palpitations   . Thyroid disease     Past Surgical History:  Procedure Laterality Date  . BREAST BIOPSY  2009  . BREAST LUMPECTOMY    . HEMANGIOMA EXCISION Left 1990   arm  . MELANOMA EXCISION Right 1980   thigh  . NECK SURGERY  10/2009   C5-6 fusion  . SHOULDER ARTHROSCOPY    . TONSILLECTOMY       Current Outpatient Medications  Medication Sig Dispense  Refill  . amitriptyline (ELAVIL) 50 MG tablet Take 50 mg by mouth at bedtime as needed for sleep.    . Biotin 5000 MCG CAPS Take 5,000 mcg by mouth daily.      . cholecalciferol (VITAMIN D) 1000 UNITS tablet Take 2,000 Units by mouth daily. Taking 3 Days a Week    . Cyanocobalamin (VITAMIN B12 PO) Take by mouth.    . diclofenac sodium (VOLTAREN) 1 % GEL APPLY 2-4 GRAMS TO LARGE JOINT AREA UP TO FOUR TIMES A DAY AS NEEDED 2 Tube 2  . fish oil-omega-3 fatty acids 1000 MG capsule Take 2 g by mouth daily.      . flecainide (TAMBOCOR) 50 MG tablet Take 1 tablet (50 mg total) by mouth 2 (two) times daily. Start taking on 06/22/18. 60 tablet 1  . gabapentin (NEURONTIN) 300 MG capsule Take 900 mg by mouth 2 (two) times daily.     [name] levothyroxine (SYNTHROID) 50 MCG tablet Take 1 tablet (50 mcg total) by mouth daily before breakfast.    . metoprolol tartrate (LOPRESSOR) 25 MG tablet Take 1 tablet (25 mg total) by mouth 2 (two) times daily. 180 tablet 3  . nitrofurantoin (MACRODANTIN) 50 MG capsule Take 1 capsule (50 mg total)  by mouth daily. 30 capsule 6   No current facility-administered medications for this visit.     Allergies:   Codeine    Social History:  The patient  reports that she has never smoked. She has never used smokeless tobacco. She reports that she drinks alcohol. She reports that she does not use drugs.   Family History:  The patient's family history includes Coronary artery disease in her mother; Fibromyalgia in her sister; Heart disease in her maternal grandmother; Hypertension in her mother; Neuropathy in her father; Osteoarthritis in her father; Osteoporosis in her mother; Other in her father; Tremor in her father.    ROS:  General:no colds or fevers, no weight changes Skin:no rashes or ulcers HEENT:no blurred vision, no congestion CV:see HPI PUL:see HPI GI:no diarrhea constipation or melena, no indigestion GU:no hematuria, no dysuria MS:no joint pain, no  claudication Neuro:no syncope, no lightheadedness Endo:no diabetes, no thyroid disease  Wt Readings from Last 3 Encounters:  06/24/18 138 lb 12.8 oz (63 kg)  06/09/18 137 lb 6.4 oz (62.3 kg)  05/14/18 138 lb (62.6 kg)     PHYSICAL EXAM: VS:  BP 100/72   Pulse 67   Ht 5\' 8"  (1.727 m)   Wt 138 lb 12.8 oz (63 kg)   SpO2 97%   BMI 21.10 kg/m  , BMI Body mass index is 21.1 kg/m. General:Pleasant affect, NAD Skin:Warm and dry, brisk capillary refill HEENT:normocephalic, sclera clear, mucus membranes moist Neck:supple, no JVD, no bruits  Heart:S1S2 RRR with soft mital murmur, no gallup, rub or click Lungs:clear without rales, rhonchi, or wheezes JQB:HALP, non tender, + BS, do not palpate liver spleen or masses Ext:no lower ext edema, 2+ pedal pulses, 2+ radial pulses Neuro:alert and oriented, X 3 MAE, follows commands, + facial symmetry    EKG:  EKG is ordered today. The ekg ordered today demonstrates SR QRS 92 ms previously 86 ms  No acute changes.    Recent Labs: 12/09/2017: ALT 11; Hemoglobin 12.3; Platelets 185 06/09/2018: BUN 12; Creatinine, Ser 0.75; Magnesium 2.2; Potassium 4.0; Sodium 141; TSH 1.190    Lipid Panel No results found for: CHOL, TRIG, HDL, CHOLHDL, VLDL, LDLCALC, LDLDIRECT     Other studies Reviewed: Additional studies/ records that were reviewed today include: . Echocardiogram 04/2017: Study Conclusions  - Left ventricle: The cavity size was normal. Systolic function was normal. The estimated ejection fraction was in the range of 55% to 60%. Wall motion was normal; there were no regional wall motion abnormalities. - Mitral valve: Myxomatous leaflets with mild bileaflet prolapse. Prolapse. There was mild to moderate regurgitation. Valve area by pressure half-time: 1.93 cm^2. Valve area by continuity equation (using LVOT flow): 1.34 cm^2. - Atrial septum: No defect or patent foramen ovale was identified.  TTE 06/17/18 Study  Conclusions  - Left ventricle: The cavity size was normal. Systolic function was   normal. The estimated ejection fraction was in the range of 55%   to 60%. Wall motion was normal; there were no regional wall   motion abnormalities. Doppler parameters are consistent with   abnormal left ventricular relaxation (grade 1 diastolic   dysfunction). Doppler parameters are consistent with elevated   ventricular end-diastolic filling pressure. - Aortic valve: There was no regurgitation. - Aortic root: The aortic root was normal in size. - Mitral valve: There was moderate to severe regurgitation. - Right ventricle: Systolic function was normal. - Tricuspid valve: There was mild regurgitation. - Pulmonic valve: There was trivial regurgitation. - Pulmonary  arteries: Systolic pressure was within the normal   range. - Inferior vena cava: The vessel was normal in size. - Pericardium, extracardiac: There was no pericardial effusion.  Impressions:  - Myxomatous, moderately thickened valve with bileaflet prolapse.   Mitral regurgitation jet is central and at least moderate to   severe with reversal of forward flow in the pulmonary veins.   However, left atrial size is normal and so are right sided   pressures. A TEE is recommended for further evaluation.  ASSESSMENT AND PLAN:  1.  Moderate to severe MR for TEE 06/30/18 with Dr. Radford Pax.     [facility] has been requested to perform a transesophageal echocardiogram on JOSSALYN FORGIONE for Mitral regurgitation.  After careful review of history and examination, the risks and benefits of transesophageal echocardiogram have been explained including risks of esophageal damage, perforation (1:10,000 risk), bleeding, pharyngeal hematoma as well as other potential complications associated with conscious sedation including aspiration, arrhythmia, respiratory failure and death. Alternatives to treatment were discussed, questions were answered. Patient is  willing to proceed.    2.  PVCs and SVT on flecainide  For ETT on Monday to eval QRS widening.    3.  Borderline BP stable.     Current medicines are reviewed with the patient today.  The patient Has no concerns regarding medicines.  The following changes have been made:  See above Labs/ tests ordered today include:see above  Disposition:   FU:  see above  Signed, Cecilie Kicks, NP  06/24/2018 9:52 AM    [facility] [place], [place], [place] Phone: [phone]; Fax: [phone]

## 2018-06-23 NOTE — Progress Notes (Signed)
Cardiology Office Note   Date:  06/24/2018   ID:  HUONG LUTHI, DOB Aug 17, 1946, MRN [id number]  PCP:  Marton Redwood, MD  Cardiologist:  Dr. [name]  Was Dr. Haroldine Laws  Chief Complaint  Patient presents with  . Palpitations    PVCs and MR       History of Present Illness: NIRALYA OHANIAN is a 72 y.o. female who presents for MR and need for TEE.  PMH of mitral valve prolapse with mild-moderate MR on last echo 04/2017, PACs, breast cancer s/p lumpectomy and radiation in 2009, interstitial cystitis, and chronic cystitis on macrobid. She previously followed outpatient with Dr. Haroldine Laws for MVP/MR and PAC history, however at her last visit, she was recommended to establish care with [facility] going forward.  She states the skipped beats would draw her attention while she was resting and would last for a few seconds and go away spontaneously. These episodes occur multiple times per day. No associated SOB, chest pain, dizziness, lightheadedness, or syncope.   She wore a monitor which revealed sinus brady to sinus tach, with freq PVCs.  + PACs and SVT with 15 beats.  Flecainide was started at 50 mg BID.   She is for ETT on Monday to eval QRS.  She has fewer PVCs at this point.    Echo with mod to severe MR. Last year it was mild to moderate MR.  EF is the same at 55-60%, G1DD,   Still without chest pain or SOB.  No lightheadedness.    Past Medical History:  Diagnosis Date  . Arthritis   . Breast cancer ([place])    s/p lumpectomy  . Cataract   . MVP (mitral valve prolapse)   . PAC (premature atrial contraction)   . Palpitations   . Thyroid disease     Past Surgical History:  Procedure Laterality Date  . BREAST BIOPSY  2009  . BREAST LUMPECTOMY    . HEMANGIOMA EXCISION Left 1990   arm  . MELANOMA EXCISION Right 1980   thigh  . NECK SURGERY  10/2009   C5-6 fusion  . SHOULDER ARTHROSCOPY    . TONSILLECTOMY       Current Outpatient Medications  Medication Sig Dispense  Refill  . amitriptyline (ELAVIL) 50 MG tablet Take 50 mg by mouth at bedtime as needed for sleep.    . Biotin 5000 MCG CAPS Take 5,000 mcg by mouth daily.      . cholecalciferol (VITAMIN D) 1000 UNITS tablet Take 2,000 Units by mouth daily. Taking 3 Days a Week    . Cyanocobalamin (VITAMIN B12 PO) Take by mouth.    . diclofenac sodium (VOLTAREN) 1 % GEL APPLY 2-4 GRAMS TO LARGE JOINT AREA UP TO FOUR TIMES A DAY AS NEEDED 2 Tube 2  . fish oil-omega-3 fatty acids 1000 MG capsule Take 2 g by mouth daily.      . flecainide (TAMBOCOR) 50 MG tablet Take 1 tablet (50 mg total) by mouth 2 (two) times daily. Start taking on 06/22/18. 60 tablet 1  . gabapentin (NEURONTIN) 300 MG capsule Take 900 mg by mouth 2 (two) times daily.     [name] levothyroxine (SYNTHROID) 50 MCG tablet Take 1 tablet (50 mcg total) by mouth daily before breakfast.    . metoprolol tartrate (LOPRESSOR) 25 MG tablet Take 1 tablet (25 mg total) by mouth 2 (two) times daily. 180 tablet 3  . nitrofurantoin (MACRODANTIN) 50 MG capsule Take 1 capsule (50 mg total)  by mouth daily. 30 capsule 6   No current facility-administered medications for this visit.     Allergies:   Codeine    Social History:  The patient  reports that she has never smoked. She has never used smokeless tobacco. She reports that she drinks alcohol. She reports that she does not use drugs.   Family History:  The patient's family history includes Coronary artery disease in her mother; Fibromyalgia in her sister; Heart disease in her maternal grandmother; Hypertension in her mother; Neuropathy in her father; Osteoarthritis in her father; Osteoporosis in her mother; Other in her father; Tremor in her father.    ROS:  General:no colds or fevers, no weight changes Skin:no rashes or ulcers HEENT:no blurred vision, no congestion CV:see HPI PUL:see HPI GI:no diarrhea constipation or melena, no indigestion GU:no hematuria, no dysuria MS:no joint pain, no  claudication Neuro:no syncope, no lightheadedness Endo:no diabetes, no thyroid disease  Wt Readings from Last 3 Encounters:  06/24/18 138 lb 12.8 oz (63 kg)  06/09/18 137 lb 6.4 oz (62.3 kg)  05/14/18 138 lb (62.6 kg)     PHYSICAL EXAM: VS:  BP 100/72   Pulse 67   Ht 5\' 8"  (1.727 m)   Wt 138 lb 12.8 oz (63 kg)   SpO2 97%   BMI 21.10 kg/m  , BMI Body mass index is 21.1 kg/m. General:Pleasant affect, NAD Skin:Warm and dry, brisk capillary refill HEENT:normocephalic, sclera clear, mucus membranes moist Neck:supple, no JVD, no bruits  Heart:S1S2 RRR with soft mital murmur, no gallup, rub or click Lungs:clear without rales, rhonchi, or wheezes CBJ:SEGB, non tender, + BS, do not palpate liver spleen or masses Ext:no lower ext edema, 2+ pedal pulses, 2+ radial pulses Neuro:alert and oriented, X 3 MAE, follows commands, + facial symmetry    EKG:  EKG is ordered today. The ekg ordered today demonstrates SR QRS 92 ms previously 86 ms  No acute changes.    Recent Labs: 12/09/2017: ALT 11; Hemoglobin 12.3; Platelets 185 06/09/2018: BUN 12; Creatinine, Ser 0.75; Magnesium 2.2; Potassium 4.0; Sodium 141; TSH 1.190    Lipid Panel No results found for: CHOL, TRIG, HDL, CHOLHDL, VLDL, LDLCALC, LDLDIRECT     Other studies Reviewed: Additional studies/ records that were reviewed today include: . Echocardiogram 04/2017: Study Conclusions  - Left ventricle: The cavity size was normal. Systolic function was normal. The estimated ejection fraction was in the range of 55% to 60%. Wall motion was normal; there were no regional wall motion abnormalities. - Mitral valve: Myxomatous leaflets with mild bileaflet prolapse. Prolapse. There was mild to moderate regurgitation. Valve area by pressure half-time: 1.93 cm^2. Valve area by continuity equation (using LVOT flow): 1.34 cm^2. - Atrial septum: No defect or patent foramen ovale was identified.  TTE 06/17/18 Study  Conclusions  - Left ventricle: The cavity size was normal. Systolic function was   normal. The estimated ejection fraction was in the range of 55%   to 60%. Wall motion was normal; there were no regional wall   motion abnormalities. Doppler parameters are consistent with   abnormal left ventricular relaxation (grade 1 diastolic   dysfunction). Doppler parameters are consistent with elevated   ventricular end-diastolic filling pressure. - Aortic valve: There was no regurgitation. - Aortic root: The aortic root was normal in size. - Mitral valve: There was moderate to severe regurgitation. - Right ventricle: Systolic function was normal. - Tricuspid valve: There was mild regurgitation. - Pulmonic valve: There was trivial regurgitation. - Pulmonary  arteries: Systolic pressure was within the normal   range. - Inferior vena cava: The vessel was normal in size. - Pericardium, extracardiac: There was no pericardial effusion.  Impressions:  - Myxomatous, moderately thickened valve with bileaflet prolapse.   Mitral regurgitation jet is central and at least moderate to   severe with reversal of forward flow in the pulmonary veins.   However, left atrial size is normal and so are right sided   pressures. A TEE is recommended for further evaluation.  ASSESSMENT AND PLAN:  1.  Moderate to severe MR for TEE 06/30/18 with Dr. Radford Pax.     [facility] has been requested to perform a transesophageal echocardiogram on MAYIA MEGILL for Mitral regurgitation.  After careful review of history and examination, the risks and benefits of transesophageal echocardiogram have been explained including risks of esophageal damage, perforation (1:10,000 risk), bleeding, pharyngeal hematoma as well as other potential complications associated with conscious sedation including aspiration, arrhythmia, respiratory failure and death. Alternatives to treatment were discussed, questions were answered. Patient is  willing to proceed.    2.  PVCs and SVT on flecainide  For ETT on Monday to eval QRS widening.    3.  Borderline BP stable.     Current medicines are reviewed with the patient today.  The patient Has no concerns regarding medicines.  The following changes have been made:  See above Labs/ tests ordered today include:see above  Disposition:   FU:  see above  Signed, Cecilie Kicks, NP  06/24/2018 9:52 AM    [address], [place] [place], [place] Phone: [phone]; Fax: [phone]

## 2018-06-24 ENCOUNTER — Ambulatory Visit (INDEPENDENT_AMBULATORY_CARE_PROVIDER_SITE_OTHER): Payer: PPO | Admitting: Cardiology

## 2018-06-24 ENCOUNTER — Encounter: Payer: Self-pay | Admitting: Cardiology

## 2018-06-24 VITALS — BP 100/72 | HR 67 | Ht 68.0 in | Wt 138.8 lb

## 2018-06-24 DIAGNOSIS — I341 Nonrheumatic mitral (valve) prolapse: Secondary | ICD-10-CM | POA: Diagnosis not present

## 2018-06-24 DIAGNOSIS — R002 Palpitations: Secondary | ICD-10-CM

## 2018-06-24 DIAGNOSIS — I340 Nonrheumatic mitral (valve) insufficiency: Secondary | ICD-10-CM | POA: Diagnosis not present

## 2018-06-24 NOTE — Patient Instructions (Addendum)
Medication Instructions:  1. Your physician recommends that you continue on your current medications as directed. Please refer to the Current Medication list given to you today.    Labwork: NONE ORDERED TODAY  Testing/Procedures: Your physician has requested that you have a TEE. During a TEE, sound waves are used to create images of your heart. It provides your doctor with information about the size and shape of your heart and how well your heart's chambers and valves are working. In this test, a transducer is attached to the end of a flexible tube that's guided down your throat and into your esophagus (the tube leading from you mouth to your stomach) to get a more detailed image of your heart. You are not awake for the procedure. Please see the instruction sheet given to you today. For further information please visit [email or web address].     Follow-Up: Cecilie Kicks, NP 07/19/18 @ 8:30 POST PROCEDURE FOLLOW UP   Any Other Special Instructions Will Be Listed Below (If Applicable).  If you need a refill on your cardiac medications before your next appointment, please call your pharmacy.  Dear Julious Oka, You are scheduled for a TEE on 06/30/18 with Dr. Radford Pax @ 12 PM.  Please arrive at the [facility] (Main Entrance A) at [facility]: [place], [place] [address] at 10:30 AM (1 hour prior to procedure unless lab work is needed; if lab work is needed arrive 1.5 hours ahead)  DIET: Nothing to eat or drink after midnight except a sip of water with medications (see medication instructions below)  Medication Instructions: Continue your FLECAINIDE   Labs: If patient is on Coumadin, patient needs pt/INR, CBC, BMET within 3 days (No pt/INR needed for patients taking Xarelto, Eliquis, Pradaxa) For patients receiving anesthesia for TEE and all Cardioversion patients: BMET, CBC within 1 week  Come to:  (Lab option #1) Come to the lab at [facility] between the hours  of 8:00 am and 4:30 pm. You do not have to be fasting. (Lab option #2) Lab at an alternate location (Lab option #3) your lab work will be done at the hospital prior to your procedure - you will need to arrive 1  hours ahead of your procedure  You must have a responsible person to drive you home and stay in the waiting area during your procedure. Failure to do so could result in cancellation.  Bring your insurance cards.  *Special Note: Every effort is made to have your procedure done on time. Occasionally there are emergencies that occur at the hospital that may cause delays. Please be patient if a delay does occur.

## 2018-06-28 ENCOUNTER — Ambulatory Visit (INDEPENDENT_AMBULATORY_CARE_PROVIDER_SITE_OTHER): Payer: PPO

## 2018-06-28 ENCOUNTER — Other Ambulatory Visit: Payer: Self-pay | Admitting: Cardiology

## 2018-06-28 DIAGNOSIS — Z79899 Other long term (current) drug therapy: Secondary | ICD-10-CM

## 2018-06-28 DIAGNOSIS — I491 Atrial premature depolarization: Secondary | ICD-10-CM | POA: Diagnosis not present

## 2018-06-28 DIAGNOSIS — I059 Rheumatic mitral valve disease, unspecified: Secondary | ICD-10-CM | POA: Diagnosis not present

## 2018-06-28 DIAGNOSIS — Z5181 Encounter for therapeutic drug level monitoring: Secondary | ICD-10-CM | POA: Diagnosis not present

## 2018-06-28 LAB — EXERCISE TOLERANCE TEST
Estimated workload: 12 METS
Exercise duration (min): 10 min
Exercise duration (sec): 10 s
MPHR: 150 {beats}/min
Peak HR: 136 {beats}/min
Percent HR: 91 %
RPE: 17
Rest HR: 71 {beats}/min

## 2018-06-30 ENCOUNTER — Telehealth: Payer: Self-pay | Admitting: Cardiology

## 2018-06-30 ENCOUNTER — Other Ambulatory Visit: Payer: Self-pay

## 2018-06-30 ENCOUNTER — Ambulatory Visit (HOSPITAL_BASED_OUTPATIENT_CLINIC_OR_DEPARTMENT_OTHER)
Admission: RE | Admit: 2018-06-30 | Discharge: 2018-06-30 | Disposition: A | Discharge status: Home / Self Care | Payer: PPO | Source: Ambulatory Visit | Attending: Cardiology | Admitting: Cardiology

## 2018-06-30 ENCOUNTER — Encounter (HOSPITAL_COMMUNITY)
Admission: RE | Disposition: A | Discharge status: Home / Self Care | Payer: Self-pay | Source: Ambulatory Visit | Attending: Cardiology

## 2018-06-30 ENCOUNTER — Ambulatory Visit (HOSPITAL_COMMUNITY)
Admission: RE | Admit: 2018-06-30 | Discharge: 2018-06-30 | Disposition: A | Discharge status: Home / Self Care | Payer: PPO | Source: Ambulatory Visit | Attending: Cardiology | Admitting: Cardiology

## 2018-06-30 DIAGNOSIS — M1990 Unspecified osteoarthritis, unspecified site: Secondary | ICD-10-CM | POA: Insufficient documentation

## 2018-06-30 DIAGNOSIS — Z8262 Family history of osteoporosis: Secondary | ICD-10-CM | POA: Insufficient documentation

## 2018-06-30 DIAGNOSIS — E079 Disorder of thyroid, unspecified: Secondary | ICD-10-CM | POA: Diagnosis not present

## 2018-06-30 DIAGNOSIS — H269 Unspecified cataract: Secondary | ICD-10-CM | POA: Insufficient documentation

## 2018-06-30 DIAGNOSIS — I340 Nonrheumatic mitral (valve) insufficiency: Secondary | ICD-10-CM

## 2018-06-30 DIAGNOSIS — Z923 Personal history of irradiation: Secondary | ICD-10-CM | POA: Insufficient documentation

## 2018-06-30 DIAGNOSIS — I493 Ventricular premature depolarization: Secondary | ICD-10-CM | POA: Insufficient documentation

## 2018-06-30 DIAGNOSIS — Z853 Personal history of malignant neoplasm of breast: Secondary | ICD-10-CM | POA: Insufficient documentation

## 2018-06-30 DIAGNOSIS — Z981 Arthrodesis status: Secondary | ICD-10-CM | POA: Diagnosis not present

## 2018-06-30 DIAGNOSIS — I341 Nonrheumatic mitral (valve) prolapse: Secondary | ICD-10-CM | POA: Insufficient documentation

## 2018-06-30 DIAGNOSIS — Z9889 Other specified postprocedural states: Secondary | ICD-10-CM | POA: Insufficient documentation

## 2018-06-30 DIAGNOSIS — Z79890 Hormone replacement therapy (postmenopausal): Secondary | ICD-10-CM | POA: Insufficient documentation

## 2018-06-30 DIAGNOSIS — Z885 Allergy status to narcotic agent status: Secondary | ICD-10-CM | POA: Diagnosis not present

## 2018-06-30 DIAGNOSIS — I491 Atrial premature depolarization: Secondary | ICD-10-CM | POA: Diagnosis not present

## 2018-06-30 DIAGNOSIS — Z8249 Family history of ischemic heart disease and other diseases of the circulatory system: Secondary | ICD-10-CM | POA: Insufficient documentation

## 2018-06-30 DIAGNOSIS — Z79899 Other long term (current) drug therapy: Secondary | ICD-10-CM | POA: Insufficient documentation

## 2018-06-30 DIAGNOSIS — Z85820 Personal history of malignant melanoma of skin: Secondary | ICD-10-CM | POA: Diagnosis not present

## 2018-06-30 DIAGNOSIS — I471 Supraventricular tachycardia: Secondary | ICD-10-CM | POA: Diagnosis not present

## 2018-06-30 HISTORY — PX: TEE WITHOUT CARDIOVERSION: SHX5443

## 2018-06-30 SURGERY — ECHOCARDIOGRAM, TRANSESOPHAGEAL
Anesthesia: Moderate Sedation

## 2018-06-30 MED ORDER — FENTANYL CITRATE (PF) 100 MCG/2ML IJ SOLN
INTRAMUSCULAR | Status: AC
  Filled 2018-06-30: qty 2

## 2018-06-30 MED ORDER — MIDAZOLAM HCL 5 MG/ML IJ SOLN
INTRAMUSCULAR | Status: AC
  Filled 2018-06-30: qty 2

## 2018-06-30 MED ORDER — FENTANYL CITRATE (PF) 100 MCG/2ML IJ SOLN
INTRAMUSCULAR | Status: DC | PRN
  Administered 2018-06-30: 25 ug via INTRAVENOUS

## 2018-06-30 MED ORDER — SODIUM CHLORIDE 0.9 % IV SOLN
INTRAVENOUS | Status: DC
  Administered 2018-06-30: 11:00:00 via INTRAVENOUS

## 2018-06-30 MED ORDER — MIDAZOLAM HCL 10 MG/2ML IJ SOLN
INTRAMUSCULAR | Status: DC | PRN
  Administered 2018-06-30: 1 mg via INTRAVENOUS
  Administered 2018-06-30: 2 mg via INTRAVENOUS

## 2018-06-30 MED ORDER — BUTAMBEN-TETRACAINE-BENZOCAINE 2-2-14 % EX AERO
INHALATION_SPRAY | CUTANEOUS | Status: DC | PRN
  Administered 2018-06-30: 2 via TOPICAL

## 2018-06-30 NOTE — Telephone Encounter (Signed)
Spoke with the pt and informed her that we did not send in a 90 day supply of Flecainide, for we wanted her GXT done and to come back normal, before kicking a full 90 day supply of this med to her local pharmacy.  Endorsed to the pt when she is getting down to the last week of supply of this med, she should mychart [place] or call us back, then we can kick this refill back to her pharmacy for a 90 day supply, as requested.  Pt verbalized understanding and agrees with this plan.

## 2018-06-30 NOTE — Progress Notes (Signed)
  Echocardiogram Echocardiogram Transesophageal has been performed.  Kellie Humphrey 06/30/2018, 12:43 PM

## 2018-06-30 NOTE — Interval H&P Note (Signed)
History and Physical Interval Note:  06/30/2018 10:40 AM  Kellie Humphrey  has presented today for surgery, with the diagnosis of mitral regurgitation  The various methods of treatment have been discussed with the patient and family. After consideration of risks, benefits and other options for treatment, the patient has consented to  Procedure(s): TRANSESOPHAGEAL ECHOCARDIOGRAM (TEE) (N/A) as a surgical intervention .  The patient's history has been reviewed, patient examined, no change in status, stable for surgery.  I have reviewed the patient's chart and labs.  Questions were answered to the patient's satisfaction.     [name]

## 2018-06-30 NOTE — Telephone Encounter (Signed)
New Message:   Patient calling to requesting that her medication be 90 instead of 30 ndays

## 2018-06-30 NOTE — Discharge Instructions (Signed)
Transesophageal Echocardiogram °Transesophageal echocardiography (TEE) is a picture test of your heart using sound waves. The pictures taken can give very detailed pictures of your heart. This can help your doctor see if there are problems with your heart. TEE can check: °· If your heart has blood clots in it. °· How well your heart valves are working. °· If you have an infection on the inside of your heart. °· Some of the major arteries of your heart. °· If your heart valve is working after a repair. °· Your heart before a procedure that uses a shock to your heart to get the rhythm back to normal. ° °What happens before the procedure? °· Do not eat or drink for 6 hours before the procedure or as told by your doctor. °· Make plans to have someone drive you home after the procedure. Do not drive yourself home. °· An IV tube will be put in your arm. °What happens during the procedure? °· You will be given a medicine to help you relax (sedative). It will be given through the IV tube. °· A numbing medicine will be sprayed or gargled in the back of your throat to help numb it. °· The tip of the probe is placed into the back of your mouth. You will be asked to swallow. This helps to pass the probe into your esophagus. °· Once the tip of the probe is in the right place, your doctor can take pictures of your heart. °· You may feel pressure at the back of your throat. °What happens after the procedure? °· You will be taken to a recovery area so the sedative can wear off. °· Your throat may be sore and scratchy. This will go away slowly over time. °· You will go home when you are fully awake and able to swallow liquids. °· You should have someone stay with you for the next 24 hours. °· Do not drive or operate machinery for the next 24 hours. °This information is not intended to replace advice given to you by your health care provider. Make sure you discuss any questions you have with your health care provider. °Document  Released: 07/13/2009 Document Revised: 02/21/2016 Document Reviewed: 03/17/2013 °Elsevier Interactive Patient Education © 2018 [employer] ° °

## 2018-06-30 NOTE — CV Procedure (Signed)
    PROCEDURE NOTE:  Procedure:  Transesophageal echocardiogram Operator:  [name], MD Indications:  MVP with MR Complications: None  During this procedure the patient is administered a total of Versed 3 mg and Fentanyl 93mcg to achieve and maintain moderate conscious sedation.  The patient's heart rate, blood pressure, and oxygen saturation are monitored continuously during the procedure. The period of conscious sedation is 20 minutes, of which I was present face-to-face 100% of this time.  Results: Normal LV size and function Normal RV size and function Mildly dilated  RA Normal LA Normal TV with mild TR Normal PV with trivial PR Mildly thickened MV leaflets with mild bileaflet prolapse.  There is moderate MR with no evidence of flow into pulmonary veins.   Mildly thickened trileaflet AV with trivial AR Normal interatrial septum with no evidence of shunt by colorflow dopper  Normal thoracic and ascending aorta.  The patient tolerated the procedure well and was transferred back to their room in stable condition.  Signed: [name], MD [facility]

## 2018-07-01 ENCOUNTER — Encounter (HOSPITAL_COMMUNITY): Payer: Self-pay | Admitting: Cardiology

## 2018-07-07 ENCOUNTER — Telehealth: Payer: Self-pay | Admitting: Cardiology

## 2018-07-07 NOTE — Telephone Encounter (Signed)
New Message    Patient is calling in reference to echo TEE results. Please call.

## 2018-07-07 NOTE — Telephone Encounter (Signed)
Result Notes for ECHO TEE   Notes recorded by Isaiah Serge, NP on 07/04/2018 at 7:15 PM EDT Dr. [name], what would you like for next step? For her severe MR?   Informed the pt that Dr [name] is out of the office this week, but this result was forwarded to her in-basket by Cecilie Kicks NP, for next steps based on this result. Informed the pt that once Dr [name] reviews and advises on this, someone from the office will follow-up with her shortly thereafter.  Pt verbalized understanding and agrees with this plan.  Will send to Dr [name] as an Juluis Rainier.

## 2018-07-13 DIAGNOSIS — I491 Atrial premature depolarization: Secondary | ICD-10-CM

## 2018-07-13 DIAGNOSIS — I059 Rheumatic mitral valve disease, unspecified: Secondary | ICD-10-CM

## 2018-07-14 MED ORDER — FLECAINIDE ACETATE 50 MG PO TABS: 50.0000 mg | ORAL_TABLET | Freq: Two times a day (BID) | ORAL | 3 refills | Status: DC

## 2018-07-19 ENCOUNTER — Ambulatory Visit: Payer: PPO | Admitting: Cardiology

## 2018-07-19 ENCOUNTER — Encounter: Payer: Self-pay | Admitting: Cardiology

## 2018-07-19 VITALS — BP 92/64 | HR 65 | Ht 68.0 in | Wt 138.1 lb

## 2018-07-19 DIAGNOSIS — R002 Palpitations: Secondary | ICD-10-CM | POA: Diagnosis not present

## 2018-07-19 DIAGNOSIS — I340 Nonrheumatic mitral (valve) insufficiency: Secondary | ICD-10-CM | POA: Diagnosis not present

## 2018-07-19 DIAGNOSIS — I471 Supraventricular tachycardia: Secondary | ICD-10-CM

## 2018-07-19 NOTE — Patient Instructions (Signed)
Medication Instructions:   Your physician recommends that you continue on your current medications as directed. Please refer to the Current Medication list given to you today.   If you need a refill on your cardiac medications before your next appointment, please call your pharmacy.  Labwork: NONE ORDERED  TODAY    Testing/Procedures: NONE ORDERED  TODAY   Follow-Up:   AS SCHEDULED   Any Other Special Instructions Will Be Listed Below (If Applicable).                                                                                                                                                   

## 2018-07-19 NOTE — Progress Notes (Signed)
Cardiology Office Note   Date:  07/19/2018   ID:  Kellie Humphrey, DOB Aug 01, 1946, MRN [id number]  PCP:  Marton Redwood, MD  Cardiologist:  Dr. [name]    Chief Complaint  Patient presents with  . Tachycardia    with SVT, severe mitral regurg.      History of Present Illness: Kellie Humphrey is a 72 y.o. female who presents for post TEE   PMH of mitral valve prolapse with mild-moderate MR on last echo 04/2017, PACs, breast cancer s/p lumpectomy and radiation in 2009, interstitial cystitis, and chronic cystitis on macrobid. She previously followed outpatient with Dr. Haroldine Laws for MVP/MR and PAC history, however at her last visit, she was recommended to establish care with [facility] going forward.  She states the skipped beats would draw her attention while she was resting and would last for a few seconds and go away spontaneously. These episodes occur multiple times per day. No associated SOB, chest pain, dizziness, lightheadedness, or syncope.   She wore a monitor which revealed sinus brady to sinus tach, with freq PVCs.  + PACs and SVT with 15 beats.  Flecainide was started at 50 mg BID.   She is for ETT on Monday to eval QRS.  She has fewer PVCs at this point.    Echo with mod to severe MR. Last year it was mild to moderate MR.  EF is the same at 55-60%, G1DD,   Still without chest pain or SOB.  No lightheadedness.    TEE with EF 60-65% Mitral valve: Moderate, holosystolicprolapse, involving the   anterior leaflet and the posterior leaflet. There was moderate to   severe regurgitation. Effective regurgitant orifice (PISA): 0.22   cm^2. Regurgitant volume (PISA): 42 ml.  If no symptoms per Dr. [name] will monitor. Per Dr. [name] "I have reviewed her images and she has severe MR however in the absence of symptoms we wont do anything. Please see her on 10/21 and I will see her in January and we will proceed with either MV repair of mitraclip once she starts having symptoms"   ETT on Flecainide  No QRS widening.   Pt's husband was with her today.  Discussed symptoms of MR and to call if they begin to occur.  Discussed briefly MVR vs mitral clip.  She continues to exercise, no dyspnea.    Past Medical History:  Diagnosis Date  . Arthritis   . Breast cancer ([place])    s/p lumpectomy  . Cataract   . Dense breast 09/06/2013  . Malignant neoplasm of lower-outer quadrant of right breast of female, estrogen receptor positive ([place]) 02/11/2012  . Mitral valve disorder 05/08/2009   Qualifier: Diagnosis of  By: [name], RN, [name]    . MVP (mitral valve prolapse)   . Nonrheumatic mitral valve regurgitation   . PAC (premature atrial contraction)   . Palpitations   . Paresthesias 07/29/2014  . Personal history of breast cancer 09/06/2013  . PREMATURE ATRIAL CONTRACTIONS 05/08/2009   Qualifier: Diagnosis of  By: [name], RN, [name]    . Thyroid disease   . Tremor 12/08/2013  . Varicose veins of right lower extremity with complications 8/56/3149    Past Surgical History:  Procedure Laterality Date  . BREAST BIOPSY  2009  . BREAST LUMPECTOMY    . HEMANGIOMA EXCISION Left 1990   arm  . MELANOMA EXCISION Right 1980   thigh  . NECK SURGERY  10/2009   C5-6 fusion  . SHOULDER ARTHROSCOPY    .  TEE WITHOUT CARDIOVERSION N/A 06/30/2018   Procedure: TRANSESOPHAGEAL ECHOCARDIOGRAM (TEE);  Surgeon: Sueanne Margarita, MD;  Location: [facility] ENDOSCOPY;  Service: Cardiovascular;  Laterality: N/A;  . TONSILLECTOMY       Current Outpatient Medications  Medication Sig Dispense Refill  . amitriptyline (ELAVIL) 50 MG tablet Take 50 mg by mouth at bedtime.     . Biotin 5000 MCG CAPS Take 5,000 mcg by mouth daily.      . Carboxymethylcellul-Glycerin (LUBRICATING EYE DROPS OP) Place 1 drop into both eyes daily as needed (dry eyes).    . Cholecalciferol (VITAMIN D) 2000 units tablet Take 2,000 Units by mouth daily.    . Cyanocobalamin (B-12) 5000 MCG CAPS Take 5,000 mcg by mouth 2 (two) times  a week.    . diclofenac sodium (VOLTAREN) 1 % GEL APPLY 2-4 GRAMS TO LARGE JOINT AREA UP TO FOUR TIMES A DAY AS NEEDED 2 Tube 2  . fish oil-omega-3 fatty acids 1000 MG capsule Take 1 g by mouth 2 (two) times daily.     . flecainide (TAMBOCOR) 50 MG tablet Take 1 tablet (50 mg total) by mouth 2 (two) times daily. Start taking on 06/22/18. 180 tablet 3  . gabapentin (NEURONTIN) 600 MG tablet Take 600 mg by mouth 2 (two) times daily.    [name] ibuprofen (ADVIL,MOTRIN) 200 MG tablet Take 600 mg by mouth daily as needed for headache or moderate pain.    [name] levothyroxine (SYNTHROID) 50 MCG tablet Take 1 tablet (50 mcg total) by mouth daily before breakfast.    . metoprolol tartrate (LOPRESSOR) 25 MG tablet Take 1 tablet (25 mg total) by mouth 2 (two) times daily. 180 tablet 3  . nitrofurantoin (MACRODANTIN) 50 MG capsule Take 1 capsule (50 mg total) by mouth daily. 30 capsule 6   No current facility-administered medications for this visit.     Allergies:   Codeine    Social History:  The patient  reports that she has never smoked. She has never used smokeless tobacco. She reports that she drinks alcohol. She reports that she does not use drugs.   Family History:  The patient's family history includes Coronary artery disease in her mother; Fibromyalgia in her sister; Heart disease in her maternal grandmother; Hypertension in her mother; Neuropathy in her father; Osteoarthritis in her father; Osteoporosis in her mother; Other in her father; Tremor in her father.    ROS:  General:no colds or fevers, no weight changes Skin:no rashes or ulcers HEENT:no blurred vision, no congestion CV:see HPI PUL:see HPI GI:no diarrhea constipation or melena, no indigestion GU:no hematuria, no dysuria MS:no joint pain, no claudication Neuro:no syncope, no lightheadedness Endo:no diabetes, + thyroid disease  Wt Readings from Last 3 Encounters:  07/19/18 138 lb 1.9 oz (62.7 kg)  06/30/18 137 lb (62.1 kg)  06/24/18 138  lb 12.8 oz (63 kg)     PHYSICAL EXAM: VS:  BP 92/64   Pulse 65   Ht 5\' 8"  (1.727 m)   Wt 138 lb 1.9 oz (62.7 kg)   SpO2 98%   BMI 21.00 kg/m  , BMI Body mass index is 21 kg/m. General:Pleasant affect, NAD Skin:Warm and dry, brisk capillary refill HEENT:normocephalic, sclera clear, mucus membranes moist Neck:supple, no JVD, no bruits  Heart:S1S2 RRR without murmur, gallup, rub or click Lungs:clear without rales, rhonchi, or wheezes KVQ:QVZD, non tender, + BS, do not palpate liver spleen or masses Ext:no lower ext edema, 2+ pedal pulses, 2+ radial pulses Neuro:alert and oriented, MAE, follows  commands, + facial symmetry    EKG:  EKG is NOT ordered today.    Recent Labs: 12/09/2017: ALT 11; Hemoglobin 12.3; Platelets 185 06/09/2018: BUN 12; Creatinine, Ser 0.75; Magnesium 2.2; Potassium 4.0; Sodium 141; TSH 1.190    Lipid Panel No results found for: CHOL, TRIG, HDL, CHOLHDL, VLDL, LDLCALC, LDLDIRECT     Other studies Reviewed: Additional studies/ records that were reviewed today include: Echo TEE 06/30/18 Study Conclusions  - Left ventricle: Systolic function was normal. The estimated   ejection fraction was in the range of 60% to 65%. Wall motion was   normal; there were no regional wall motion abnormalities. - Aortic valve: Trileaflet; mildly thickened leaflets. There was   trivial regurgitation. - Mitral valve: Moderate, holosystolicprolapse, involving the   anterior leaflet and the posterior leaflet. There was moderate to   severe regurgitation. Effective regurgitant orifice (PISA): 0.22   cm^2. Regurgitant volume (PISA): 42 ml. - Left atrium: No evidence of thrombus in the atrial cavity or   appendage. - Right atrium: The atrium was mildly dilated. No evidence of   thrombus in the atrial cavity or appendage. - Tricuspid valve: There was mild regurgitation. - Pulmonic valve: There was trivial regurgitation.  ETT on flecanide    Study Highlights      Blood pressure demonstrated a normal response to exercise.  No T wave inversion was noted during stress.  Upsloping ST segment depression ST segment depression of 1 mm was noted during stress in the II, III, aVF, V6, V5 and V4 leads, and returning to baseline after less than 1 minute of recovery.  The patient experienced no angina during the stress test  Overall, the patient's exercise capacity was excellent.  [facility] Treadmill Score: low risk   Negative stress test without evidence of ischemia at given workload. Excellent exercise capacity. Frequent PVC's noted in early recovery.    Study Conclusions  - Left ventricle: The cavity size was normal. Systolic function was   normal. The estimated ejection fraction was in the range of 55%   to 60%. Wall motion was normal; there were no regional wall   motion abnormalities. Doppler parameters are consistent with   abnormal left ventricular relaxation (grade 1 diastolic   dysfunction). Doppler parameters are consistent with elevated   ventricular end-diastolic filling pressure. - Aortic valve: There was no regurgitation. - Aortic root: The aortic root was normal in size. - Mitral valve: There was moderate to severe regurgitation. - Right ventricle: Systolic function was normal. - Tricuspid valve: There was mild regurgitation. - Pulmonic valve: There was trivial regurgitation. - Pulmonary arteries: Systolic pressure was within the normal   range. - Inferior vena cava: The vessel was normal in size. - Pericardium, extracardiac: There was no pericardial effusion.  Impressions:  - Myxomatous, moderately thickened valve with bileaflet prolapse.   Mitral regurgitation jet is central and at least moderate to   severe with reversal of forward flow in the pulmonary veins.   However, left atrial size is normal and so are right sided   pressures. A TEE is recommended for further evaluation.  ASSESSMENT AND PLAN:  1.  Severe MR  per TEE no SOB or fatigue, able to exercise she will notify us if change in exertion, recurrent tachycardia or fatigue.  2.  SVT, PACs and PVCs on flecainide with no widening of QRS on EKG.   3.  BP soft but no dizziness.   Follow up with Dr. [name] in Jan.  Current medicines are reviewed with the patient today.  The patient Has no concerns regarding medicines.  The following changes have been made:  See above Labs/ tests ordered today include:see above  Disposition:   FU:  see above  Signed, Cecilie Kicks, NP  07/19/2018 9:07 AM    [address], [place], [place], [place] Phone: [phone]; Fax: [phone]

## 2018-07-28 DIAGNOSIS — Z23 Encounter for immunization: Secondary | ICD-10-CM | POA: Diagnosis not present

## 2018-08-09 ENCOUNTER — Telehealth: Payer: Self-pay | Admitting: Cardiology

## 2018-08-09 MED ORDER — FLECAINIDE ACETATE 50 MG PO TABS: 75.0000 mg | ORAL_TABLET | Freq: Two times a day (BID) | ORAL | Status: DC

## 2018-08-09 NOTE — Telephone Encounter (Signed)
Please increase flecainide to 75 MG PO BID and bring her for ECG only in 3 days.  Thank you, KN

## 2018-08-09 NOTE — Telephone Encounter (Signed)
Pt is calling to report to Dr [name] that ever since she started taking Flecainide 50 mg po bid, her symptoms of palpitations have improved, but are still there, especially when her next dose of flecainide is due to be administered. Pt states that she will have her palpitations, then she will take her AM dose of flecainide, then her symptoms resolve completely an hour after administration.  Pt states that she can tell her palpitations come back in the evening, when she is due for her next dose of flecainide. Pt states after waiting about an hour after her second dose, her symptoms resolve.  Pt is wanting Dr [name] to advise on what she should do during the time she is having break-through palpitations, in between administration doses of flecainide.  Pt is inquiring maybe a dose increase.  Pt reports no Vital Signs.  Pt reports NO complaints of chest pain, SOB, DOE, pre-syncopal or syncopal episodes. Pt states she sometimes feels "flushed" with her palpitations.  Informed the pt that Dr [name] is out of the office today but I will route this message to her for further review and recommendation, and follow-up with the pt thereafter.  Advised the pt to avoid caffeine, chocolate, or any teas and sodas.  Advised the pt to continue her current med regimen, until otherwise advised.  Advised the pt to stay plenty hydrated.  Pt verbalized understanding and agrees with this plan.

## 2018-08-09 NOTE — Telephone Encounter (Signed)
Dr. [name] contacted me back about this pt.  If an APP appt is open for this Thursday, would prefer the pt be seen with active symptoms and increase in Flecainide to 75 mg po bid.  If appt not available with an APP, then the pt should have a nurse visit EKG.  Scheduled the pt to see Kellie Kicks NP for this Thursday 11/14 at 1130.    Spoke with the pt and endorsed to her that per Dr [name], she would like for her to increase her Flecainide to 75 mg po BID.  Endorsed to the pt that she will need to take 1 1/2 tablets of her 50 mg tabs of flecainide, twice daily to make this equivalent. Endorsed to the pt that if at that visit with Kellie Kicks NP her Flecainide is helping, then she should endorse this to Kellie Humphrey or the nurse working with her, so that they can kick further refills of this med to her confirmed pharmacy of choice.  Pt agreed to this plan, stating she had plenty of the 50 mg tabs of Flecainide on hand, and will endorse to Kellie Humphrey at her 11/14 visit, to send further refills or not.  Informed the pt that her appt is scheduled for this Thursday 08/12/18 at 1130 with Kellie Kicks NP.  Advised the pt to arrive 15 mins prior to this appt.  Informed the pt that we will do an EKG at that visit as well.  Pt verbalized understanding and agrees with this plan.

## 2018-08-09 NOTE — Telephone Encounter (Signed)
Patient c/o Palpitations:  High priority if patient c/o lightheadedness, shortness of breath, or chest pain  1) How long have you had palpitations/irregular HR/ Afib? Are you having the symptoms now? For awhile. Yes   2) Are you currently experiencing lightheadedness, SOB or CP? No  3) Do you have a history of afib (atrial fibrillation) or irregular heart rhythm? No 4) Have you checked your BP or HR? (document readings if available): No  5) Are you experiencing any other symptoms? No

## 2018-08-10 NOTE — Progress Notes (Addendum)
Cardiology Office Note   Date:  08/12/2018   ID:  Kellie Humphrey, DOB 05-23-1946, MRN [id number]  PCP:  Kellie Redwood, MD  Cardiologist:  Dr. [name]    Chief Complaint  Patient presents with  . Palpitations      History of Present Illness: Kellie Humphrey is a 72 y.o. female who presents for palpitatons and increased dose of flecainide.    PMH of mitral valve prolapse with mild-moderate MR on last echo 04/2017, PACs, breast cancer s/p lumpectomy and radiation in 2009, interstitial cystitis, and chronic cystitis on macrobid. She previously followed outpatient with Dr. Haroldine Laws for MVP/MR and PAC history, however at her last visit, she was recommended to establish care with [facility] going forward.  She states the skipped beats would draw her attention while she was resting and would last for a few seconds and go away spontaneously. These episodes occur multiple times per day. No associated SOB, chest pain, dizziness, lightheadedness, or syncope.   She wore a monitor which revealed sinus brady to sinus tach, with freq PVCs. + PACs and SVT with 15 beats. Flecainide was started at 50 mg BID. She is for ETT on Monday to eval QRS. She has fewer PVCs at this point.   Echo with mod to severe MR. Last year it was mild to moderate MR. EF is the same at 55-60%, G1DD, Still without chest pain or SOB. No lightheadedness.   TEE with EF 60-65% Mitral valve: Moderate, holosystolicprolapse, involving the anterior leaflet and the posterior leaflet. There was moderate to severe regurgitation. Effective regurgitant orifice (PISA): 0.22 cm^2. Regurgitant volume (PISA): 42 ml.  If no symptoms per Dr. [name] will monitor. Per Dr. [name] "I have reviewed her images and she has severe MR however in the absence of symptoms we wont do anything. Please see her on 10/21 and I will see her in January and we will proceed with either MV repair of mitraclip once she starts having  symptoms"   ETT on Flecainide  No QRS widening.   Pt's husband was with her on last visit.  Discussed symptoms of MR and to call if they begin to occur.  Discussed briefly MVR vs mitral clip.  She continues to exercise, no dyspnea.  If increase of palpitations she will call.  Pt now presents after increase of palpitations.  It was xlose to time or a little after of flecainide dosing.  We increased dose to 75 mg BID and symptoms have resolved.  She continues to exercise and no chest pain or SOB.  I had discussed pt with Dr. [name] earlier this week and it was decided to have pt see Dr. Roxy Manns in consult for MR.  The increase of tachycardia and palpitations is her only symptom.     Past Medical History:  Diagnosis Date  . Arthritis   . Breast cancer ([place])    s/p lumpectomy  . Cataract   . Dense breast 09/06/2013  . Malignant neoplasm of lower-outer quadrant of right breast of female, estrogen receptor positive ([place]) 02/11/2012  . Mitral valve disorder 05/08/2009   Qualifier: Diagnosis of  By: Kellie Fillers, RN, [name]    . MVP (mitral valve prolapse)   . Nonrheumatic mitral valve regurgitation   . PAC (premature atrial contraction)   . Palpitations   . Paresthesias 07/29/2014  . Personal history of breast cancer 09/06/2013  . PREMATURE ATRIAL CONTRACTIONS 05/08/2009   Qualifier: Diagnosis of  By: Kellie Fillers, RN, [name]    .  Thyroid disease   . Tremor 12/08/2013  . Varicose veins of right lower extremity with complications 7/37/1062    Past Surgical History:  Procedure Laterality Date  . BREAST BIOPSY  2009  . BREAST LUMPECTOMY    . HEMANGIOMA EXCISION Left 1990   arm  . MELANOMA EXCISION Right 1980   thigh  . NECK SURGERY  10/2009   C5-6 fusion  . SHOULDER ARTHROSCOPY    . TEE WITHOUT CARDIOVERSION N/A 06/30/2018   Procedure: TRANSESOPHAGEAL ECHOCARDIOGRAM (TEE);  Surgeon: Kellie Margarita, MD;  Location: [facility] ENDOSCOPY;  Service: Cardiovascular;  Laterality: N/A;  . TONSILLECTOMY        Current Outpatient Medications  Medication Sig Dispense Refill  . amitriptyline (ELAVIL) 50 MG tablet Take 50 mg by mouth at bedtime.     . Biotin 5000 MCG CAPS Take 5,000 mcg by mouth daily.      . Carboxymethylcellul-Glycerin (LUBRICATING EYE DROPS OP) Place 1 drop into both eyes daily as needed (dry eyes).    . Cholecalciferol (VITAMIN D) 2000 units tablet Take 2,000 Units by mouth daily.    . Cyanocobalamin (B-12) 5000 MCG CAPS Take 5,000 mcg by mouth 2 (two) times a week.    . diclofenac sodium (VOLTAREN) 1 % GEL APPLY 2-4 GRAMS TO LARGE JOINT AREA UP TO FOUR TIMES A DAY AS NEEDED 2 Tube 2  . fish oil-omega-3 fatty acids 1000 MG capsule Take 1 g by mouth 2 (two) times daily.     . flecainide (TAMBOCOR) 50 MG tablet Take 1.5 tablets (75 mg total) by mouth 2 (two) times daily.    [name] gabapentin (NEURONTIN) 600 MG tablet Take 600 mg by mouth 2 (two) times daily.    [name] ibuprofen (ADVIL,MOTRIN) 200 MG tablet Take 600 mg by mouth daily as needed for headache or moderate pain.    [name] levothyroxine (SYNTHROID) 50 MCG tablet Take 1 tablet (50 mcg total) by mouth daily before breakfast.    . metoprolol tartrate (LOPRESSOR) 25 MG tablet Take 1 tablet (25 mg total) by mouth 2 (two) times daily. 180 tablet 3  . nitrofurantoin (MACRODANTIN) 50 MG capsule Take 1 capsule (50 mg total) by mouth daily. 30 capsule 6   No current facility-administered medications for this visit.     Allergies:   Codeine    Social History:  The patient  reports that she has never smoked. She has never used smokeless tobacco. She reports that she drinks alcohol. She reports that she does not use drugs.   Family History:  The patient's family history includes Coronary artery disease in her mother; Fibromyalgia in her sister; Heart disease in her maternal grandmother; Hypertension in her mother; Neuropathy in her father; Osteoarthritis in her father; Osteoporosis in her mother; Other in her father; Tremor in her father.     ROS:  General:no colds or fevers, no weight changes Skin:no rashes or ulcers HEENT:no blurred vision, no congestion CV:see HPI PUL:see HPI GI:no diarrhea constipation or melena, no indigestion GU:no hematuria, no dysuria MS:no joint pain, no claudication Neuro:no syncope, no lightheadedness Endo:no diabetes, + thyroid disease  Wt Readings from Last 3 Encounters:  08/12/18 139 lb 6.4 oz (63.2 kg)  07/19/18 138 lb 1.9 oz (62.7 kg)  06/30/18 137 lb (62.1 kg)     PHYSICAL EXAM: VS:  BP 90/60   Pulse 60   Ht 5\' 8"  (1.727 m)   Wt 139 lb 6.4 oz (63.2 kg)   SpO2 98%   BMI 21.20 kg/m  ,  BMI Body mass index is 21.2 kg/m. General:Pleasant affect, NAD Skin:Warm and dry, brisk capillary refill HEENT:normocephalic, sclera clear, mucus membranes moist Neck:supple, no JVD, no bruits  Heart:S1S2 RRR without murmur, gallup, rub or click Lungs:clear without rales, rhonchi, or wheezes NID:POEU, non tender, + BS, do not palpate liver spleen or masses Ext:no lower ext edema, 2+ pedal pulses, 2+ radial pulses Neuro:alert and oriented X 3, MAE, follows commands, + facial symmetry    EKG:  EKG is ordered today. The ekg ordered today demonstrates SB at 58 QRS 94     Recent Labs: 12/09/2017: ALT 11; Hemoglobin 12.3; Platelets 185 06/09/2018: BUN 12; Creatinine, Ser 0.75; Magnesium 2.2; Potassium 4.0; Sodium 141; TSH 1.190    Lipid Panel No results found for: CHOL, TRIG, HDL, CHOLHDL, VLDL, LDLCALC, LDLDIRECT     Other studies Reviewed: Additional studies/ records that were reviewed today include: . Study Conclusions  - Left ventricle: The cavity size was normal. Systolic function was normal. The estimated ejection fraction was in the range of 55% to 60%. Wall motion was normal; there were no regional wall motion abnormalities. - Mitral valve: Myxomatous leaflets with mild bileaflet prolapse. Prolapse. There was mild to moderate regurgitation. Valve area by pressure  half-time: 1.93 cm^2. Valve area by continuity equation (using LVOT flow): 1.34 cm^2. - Atrial septum: No defect or patent foramen ovale was identified.  TTE 06/17/18 Study Conclusions  - Left ventricle: The cavity size was normal. Systolic function was normal. The estimated ejection fraction was in the range of 55% to 60%. Wall motion was normal; there were no regional wall motion abnormalities. Doppler parameters are consistent with abnormal left ventricular relaxation (grade 1 diastolic dysfunction). Doppler parameters are consistent with elevated ventricular end-diastolic filling pressure. - Aortic valve: There was no regurgitation. - Aortic root: The aortic root was normal in size. - Mitral valve: There was moderate to severe regurgitation. - Right ventricle: Systolic function was normal. - Tricuspid valve: There was mild regurgitation. - Pulmonic valve: There was trivial regurgitation. - Pulmonary arteries: Systolic pressure was within the normal range. - Inferior vena cava: The vessel was normal in size. - Pericardium, extracardiac: There was no pericardial effusion.  Impressions:  - Myxomatous, moderately thickened valve with bileaflet prolapse. Mitral regurgitation jet is central and at least moderate to severe with reversal of forward flow in the pulmonary veins. However, left atrial size is normal and so are right sided pressures. A TEE is recommended for further evaluation.  Echo TEE 06/30/18 Study Conclusions  - Left ventricle: Systolic function was normal. The estimated ejection fraction was in the range of 60% to 65%. Wall motion was normal; there were no regional wall motion abnormalities. - Aortic valve: Trileaflet; mildly thickened leaflets. There was trivial regurgitation. - Mitral valve: Moderate, holosystolicprolapse, involving the anterior leaflet and the posterior leaflet. There was moderate to severe  regurgitation. Effective regurgitant orifice (PISA): 0.22 cm^2. Regurgitant volume (PISA): 42 ml. - Left atrium: No evidence of thrombus in the atrial cavity or appendage. - Right atrium: The atrium was mildly dilated. No evidence of thrombus in the atrial cavity or appendage. - Tricuspid valve: There was mild regurgitation. - Pulmonic valve: There was trivial regurgitation.  ETT on flecanide    Study Highlights     Blood pressure demonstrated a normal response to exercise.  No T wave inversion was noted during stress.  Upsloping ST segment depression ST segment depression of 1 mm was noted during stress in the II, III, aVF, V6,  V5 and V4 leads, and returning to baseline after less than 1 minute of recovery.  The patient experienced no angina during the stress test  Overall, the patient's exercise capacity was excellent.  [facility] Treadmill Score: low risk  Negative stress test without evidence of ischemia at given workload. Excellent exercise capacity. Frequent PVC's noted in early recovery.    48 hour Holter 06/09/18   Sinus bradycardia to sinus tachycardia.  Infrequent PACs and frequent PVCs (1600 in 48 hrs = 1 % of all beats).  SVT runs the longest consisting of 15 beats.   Sinus bradycardia to sinus tachycardia. Infrequent PACs and frequent PVCs (1600 in 48 hrs = 1 % of all beats). SVT runs the longest consisting of 15 beats. Symptoms correlating with PVCs.   ASSESSMENT AND PLAN:  1.  Severe MR per TEE no SOB or fatigue, able to exercise, increase of SVT and palpitations, Dr. [name] recommends referral to Dr. Roxy Manns for further recommendations on MR.  Will schedule an appt.  She has follow up with Dr. [name] in Jan.   2.  SVT, PACs and PVCs on 75 mg BID of flecainide with no QRS widening on ETT   3.  BP soft with lopressor but no dizziness.   Labs from Dr. Brigitte Pulse LFTs normal tchol 175, TG 75, HDL 69, KDL 91K+ 4.3   Current medicines are reviewed with the  patient today.  The patient Has no concerns regarding medicines.  The following changes have been made:  See above Labs/ tests ordered today include:see above  Disposition:   FU:  see above  Signed, Cecilie Kicks, NP  08/12/2018 12:06 PM    [address], [place], [address] [place], [place] Phone: [phone]; Fax: [phone]

## 2018-08-11 DIAGNOSIS — R82998 Other abnormal findings in urine: Secondary | ICD-10-CM | POA: Diagnosis not present

## 2018-08-11 DIAGNOSIS — M859 Disorder of bone density and structure, unspecified: Secondary | ICD-10-CM | POA: Diagnosis not present

## 2018-08-11 DIAGNOSIS — Z79899 Other long term (current) drug therapy: Secondary | ICD-10-CM | POA: Diagnosis not present

## 2018-08-11 DIAGNOSIS — E038 Other specified hypothyroidism: Secondary | ICD-10-CM | POA: Diagnosis not present

## 2018-08-12 ENCOUNTER — Ambulatory Visit: Payer: PPO | Admitting: Cardiology

## 2018-08-12 ENCOUNTER — Encounter: Payer: Self-pay | Admitting: Cardiology

## 2018-08-12 VITALS — BP 90/60 | HR 60 | Ht 68.0 in | Wt 139.4 lb

## 2018-08-12 DIAGNOSIS — I471 Supraventricular tachycardia: Secondary | ICD-10-CM | POA: Diagnosis not present

## 2018-08-12 DIAGNOSIS — I340 Nonrheumatic mitral (valve) insufficiency: Secondary | ICD-10-CM

## 2018-08-12 NOTE — Patient Instructions (Addendum)
Medication Instructions:  Your physician recommends that you continue on your current medications as directed. Please refer to the Current Medication list given to you today.  If you need a refill on your cardiac medications before your next appointment, please call your pharmacy.   Lab work: None  If you have labs (blood work) drawn today and your tests are completely normal, you will receive your results only by: [name] MyChart Message (if you have MyChart) OR . A paper copy in the mail If you have any lab test that is abnormal or we need to change your treatment, we will call you to review the results.  Testing/Procedures: None  Follow-Up:  Keep follow appointment with Dr. [name] on 10/06/2018 @ 10:00 AM  You have been referred to see Dr. Roxy Manns. Someone will contact you with an appointment     Any Other Special Instructions Will Be Listed Below (If Applicable).

## 2018-08-18 DIAGNOSIS — Z853 Personal history of malignant neoplasm of breast: Secondary | ICD-10-CM | POA: Diagnosis not present

## 2018-08-18 DIAGNOSIS — Z0000 Encounter for general adult medical examination without abnormal findings: Secondary | ICD-10-CM | POA: Diagnosis not present

## 2018-08-18 DIAGNOSIS — G250 Essential tremor: Secondary | ICD-10-CM | POA: Diagnosis not present

## 2018-08-18 DIAGNOSIS — M859 Disorder of bone density and structure, unspecified: Secondary | ICD-10-CM | POA: Diagnosis not present

## 2018-08-18 DIAGNOSIS — N3010 Interstitial cystitis (chronic) without hematuria: Secondary | ICD-10-CM | POA: Diagnosis not present

## 2018-08-18 DIAGNOSIS — R002 Palpitations: Secondary | ICD-10-CM | POA: Diagnosis not present

## 2018-08-18 DIAGNOSIS — Z6821 Body mass index (BMI) 21.0-21.9, adult: Secondary | ICD-10-CM | POA: Diagnosis not present

## 2018-08-18 DIAGNOSIS — Z1389 Encounter for screening for other disorder: Secondary | ICD-10-CM | POA: Diagnosis not present

## 2018-08-18 DIAGNOSIS — E038 Other specified hypothyroidism: Secondary | ICD-10-CM | POA: Diagnosis not present

## 2018-08-18 DIAGNOSIS — G6289 Other specified polyneuropathies: Secondary | ICD-10-CM | POA: Diagnosis not present

## 2018-08-18 DIAGNOSIS — I340 Nonrheumatic mitral (valve) insufficiency: Secondary | ICD-10-CM | POA: Diagnosis not present

## 2018-08-31 ENCOUNTER — Encounter: Payer: Self-pay | Admitting: Thoracic Surgery (Cardiothoracic Vascular Surgery)

## 2018-08-31 ENCOUNTER — Other Ambulatory Visit: Payer: Self-pay

## 2018-08-31 ENCOUNTER — Institutional Professional Consult (permissible substitution): Payer: PPO | Admitting: Thoracic Surgery (Cardiothoracic Vascular Surgery)

## 2018-08-31 VITALS — BP 106/70 | HR 66 | Resp 18 | Ht 68.0 in | Wt 138.0 lb

## 2018-08-31 DIAGNOSIS — I059 Rheumatic mitral valve disease, unspecified: Secondary | ICD-10-CM | POA: Diagnosis not present

## 2018-08-31 DIAGNOSIS — I340 Nonrheumatic mitral (valve) insufficiency: Secondary | ICD-10-CM | POA: Diagnosis not present

## 2018-08-31 DIAGNOSIS — I341 Nonrheumatic mitral (valve) prolapse: Secondary | ICD-10-CM | POA: Diagnosis not present

## 2018-08-31 NOTE — Patient Instructions (Signed)
Continue all previous medications without any changes at this time  

## 2018-08-31 NOTE — Progress Notes (Addendum)
[address]       [place] [address]             [facility] REPORT  Referring Provider is Isaiah Serge, NP  Primary Cardiologist is Ena Dawley, MD PCP is Marton Redwood, MD  Chief Complaint  Patient presents with  . Mitral Regurgitation    Surgical eval, ECHO/TEE  06/30/18,     HPI:  Patient is a 71 year old female with history of mitral valve prolapse referred for surgical consultation for management of possibly severe mitral regurgitation.  Patient states that she has known of presence of mitral valve prolapse for many years.  She was followed for several years by Dr. Haroldine Laws, and previous echocardiograms have documented the presence of bileaflet prolapse with mild to moderate mitral regurgitation.  More recently she has seen Cecilie Kicks on several occasions and she plans to follow-up long-term with Dr. [name].  The patient developed increased symptoms of palpitations without associated chest pain or shortness of breath.  Symptoms were described as "skipped beats" and the sensation of transient irregular heart rhythm.  She wore an event monitor which revealed sinus bradycardia with episodes of sinus tachycardia, frequent PVCs, PACs, and short bursts of nonsustained SVT.  She was started on oral flecainide and symptoms resolved.  Recent follow-up echocardiogram revealed normal left ventricular systolic function with bileaflet prolapse and moderate to severe mitral regurgitation.  She subsequently underwent transesophageal echocardiogram which confirmed the presence of bileaflet prolapse and what was felt to be moderate to severe mitral regurgitation.  Cardiothoracic surgical consultation was requested.  Patient is married and lives locally in [place] with her husband.  They own a vacation property in [place] and spend a great deal of time there as well.  The patient walks at least for 5 days every week,  typically for several miles.  [place].  She specifically denies any symptoms of exertional shortness of breath or fatigue.  She has not appreciated any decline in her exercise tolerance.  She states that when she was having the sensation of skipped beats they seem to get worse after long walks, but they were not associated with any shortness of breath or chest discomfort.  Currently on oral flecainide she no longer gets the sensation of skipped beats and she continues to walk on a regular basis without any limitation.  She specifically denies any history of PND, orthopnea, or lower extremity edema.  She has not had any dizzy spells or palpitations.  Past Medical History:  Diagnosis Date  . Arthritis   . Breast cancer ([place])    s/p lumpectomy  . Cataract   . Dense breast 09/06/2013  . Malignant neoplasm of lower-outer quadrant of right breast of female, estrogen receptor positive ([place]) 02/11/2012  . Mitral valve disorder 05/08/2009   Qualifier: Diagnosis of  By: [name], RN, [name]    . MVP (mitral valve prolapse)   . Nonrheumatic mitral valve regurgitation   . PAC (premature atrial contraction)   . Palpitations   . Paresthesias 07/29/2014  . Personal history of breast cancer 09/06/2013  . PREMATURE ATRIAL CONTRACTIONS 05/08/2009   Qualifier: Diagnosis of  By: [name], RN, [name]    . Thyroid disease   . Tremor 12/08/2013  . Varicose veins of right lower extremity with complications 1/85/6314    Past Surgical History:  Procedure Laterality Date  . BREAST BIOPSY  2009  .  BREAST LUMPECTOMY    . HEMANGIOMA EXCISION Left 1990   arm  . MELANOMA EXCISION Right 1980   thigh  . NECK SURGERY  10/2009   C5-6 fusion  . SHOULDER ARTHROSCOPY    . TEE WITHOUT CARDIOVERSION N/A 06/30/2018   Procedure: TRANSESOPHAGEAL ECHOCARDIOGRAM (TEE);  Surgeon: Sueanne Margarita, MD;  Location: [facility] ENDOSCOPY;  Service: Cardiovascular;  Laterality: N/A;  . TONSILLECTOMY      Family History    Problem Relation Age of Onset  . Coronary artery disease Mother   . Hypertension Mother   . Osteoporosis Mother        wrist fracture  . Osteoarthritis Father   . Neuropathy Father   . Tremor Father   . Other Father        lymphedema  . Fibromyalgia Sister   . Heart disease Maternal Grandmother   . Colon polyps Neg Hx   . Esophageal cancer Neg Hx   . Pancreatic cancer Neg Hx   . Stomach cancer Neg Hx   . Rectal cancer Neg Hx     Social History   Socioeconomic History  . Marital status: Married    Spouse name: [name]  . Number of children: 2  . Years of education: Bachelor  . Highest education level: Not on file  Occupational History  . Not on file  Social Needs  . Financial resource strain: Not on file  . Food insecurity:    Worry: Not on file    Inability: Not on file  . Transportation needs:    Medical: Not on file    Non-medical: Not on file  Tobacco Use  . Smoking status: Never Smoker  . Smokeless tobacco: Never Used  Substance and Sexual Activity  . Alcohol use: Yes    Comment: Socially  . Drug use: No  . Sexual activity: Not on file  Lifestyle  . Physical activity:    Days per week: Not on file    Minutes per session: Not on file  . Stress: Not on file  Relationships  . Social connections:    Talks on phone: Not on file    Gets together: Not on file    Attends religious service: Not on file    Active member of club or organization: Not on file    Attends meetings of clubs or organizations: Not on file    Relationship status: Not on file  . Intimate partner violence:    Fear of current or ex partner: Not on file    Emotionally abused: Not on file    Physically abused: Not on file    Forced sexual activity: Not on file  Other Topics Concern  . Not on file  Social History Narrative   Patient is married to [name], has 2 children   Patient is right handed   Education level is Bachelor    Caffeine consumption is none          Current  Outpatient Medications  Medication Sig Dispense Refill  . amitriptyline (ELAVIL) 50 MG tablet Take 50 mg by mouth at bedtime.     . Biotin 5000 MCG CAPS Take 5,000 mcg by mouth daily.      . Carboxymethylcellul-Glycerin (LUBRICATING EYE DROPS OP) Place 1 drop into both eyes daily as needed (dry eyes).    . Cholecalciferol (VITAMIN D) 2000 units tablet Take 2,000 Units by mouth daily.    . Cyanocobalamin (B-12) 5000 MCG CAPS Take 5,000 mcg by mouth 2 (two)  times a week.    . diclofenac sodium (VOLTAREN) 1 % GEL APPLY 2-4 GRAMS TO LARGE JOINT AREA UP TO FOUR TIMES A DAY AS NEEDED 2 Tube 2  . fish oil-omega-3 fatty acids 1000 MG capsule Take 1 g by mouth 2 (two) times daily.     . flecainide (TAMBOCOR) 50 MG tablet Take 1.5 tablets (75 mg total) by mouth 2 (two) times daily.    [name] gabapentin (NEURONTIN) 600 MG tablet Take 600 mg by mouth 2 (two) times daily.    [name] ibuprofen (ADVIL,MOTRIN) 200 MG tablet Take 600 mg by mouth daily as needed for headache or moderate pain.    [name] levothyroxine (SYNTHROID) 50 MCG tablet Take 1 tablet (50 mcg total) by mouth daily before breakfast.    . metoprolol tartrate (LOPRESSOR) 25 MG tablet Take 1 tablet (25 mg total) by mouth 2 (two) times daily. 180 tablet 3  . nitrofurantoin (MACRODANTIN) 50 MG capsule Take 1 capsule (50 mg total) by mouth daily. 30 capsule 6   No current facility-administered medications for this visit.     Allergies  Allergen Reactions  . Codeine Nausea And Vomiting      Review of Systems:   General:  normal appetite, normal energy, no weight gain, no weight loss, no fever  Cardiac:  no chest pain with exertion, no chest pain at rest, noSOB with exertion, no resting SOB, no PND, no orthopnea, + palpitations, no arrhythmia, no atrial fibrillation, no LE edema, no dizzy spells, no syncope  Respiratory:  no shortness of breath, no home oxygen, no productive cough, no dry cough, no bronchitis, no wheezing, no hemoptysis, no asthma, no pain  with inspiration or cough, no sleep apnea, no CPAP at night  GI:   no difficulty swallowing, no reflux, no frequent heartburn, no hiatal hernia, no abdominal pain, no constipation, no diarrhea, no hematochezia, no hematemesis, no melena  GU:   no dysuria,  no frequency, no urinary tract infection, no hematuria, no kidney stones, no kidney disease  Vascular:  no pain suggestive of claudication, no pain in feet, no leg cramps, no varicose veins, no DVT, no non-healing foot ulcer  Neuro:   no stroke, no TIA's, no seizures, no headaches, no temporary blindness one eye,  no slurred speech, no peripheral neuropathy, no chronic pain, no instability of gait, no memory/cognitive dysfunction  Musculoskeletal: + arthritis, no joint swelling, no myalgias, no difficulty walking, normal mobility   Skin:   no rash, no itching, no skin infections, no pressure sores or ulcerations  Psych:   no anxiety, no depression, no nervousness, no unusual recent stress  Eyes:   no blurry vision, + floaters, no recent vision changes, + wears glasses or contacts  ENT:   no hearing loss, no loose or painful teeth, no dentures, last saw dentist September 2019  Hematologic:  no easy bruising, no abnormal bleeding, no clotting disorder, no frequent epistaxis  Endocrine:  no diabetes, does not check CBG's at home     Physical Exam:   BP 106/70 (BP Location: Right Arm, Patient Position: Sitting, Cuff Size: Normal)   Pulse 66   Resp 18   Ht 5\' 8"  (1.727 m)   Wt 138 lb (62.6 kg)   SpO2 97% Comment: RA  BMI 20.98 kg/m   General:    well-appearing  HEENT:  Unremarkable   Neck:   no JVD, no bruits, no adenopathy   Chest:   clear to auscultation, symmetrical breath sounds, no wheezes,  no rhonchi   CV:   RRR, no murmur   Abdomen:  soft, non-tender, no masses   Extremities:  warm, well-perfused, pulses palpable, no LE edema  Rectal/GU  Deferred  Neuro:   Grossly non-focal and symmetrical throughout  Skin:   Clean and dry, no  rashes, no breakdown   Diagnostic Tests:  Transthoracic Echocardiography  Patient:    Ikram, Riebe MR #:       [id number] Study Date: 06/17/2018 Gender:     F Age:        12 Height:     172.7 cm Weight:     62.3 kg BSA:        1.73 m^2 Pt. Status: Room:   SONOGRAPHER  Cindy Hazy, RDCS  ATTENDING    Ena Dawley, M.D.  PERFORMING   [facility]  Marcia Brash, Krista M.  REFERRING    Kroeger, Daleen Snook M.  cc:  ------------------------------------------------------------------- LV EF: 55% -   60%  ------------------------------------------------------------------- Indications:      I05.9 Mitral Valve Disorder.  ------------------------------------------------------------------- History:   PMH:  Acquired from the patient and from the patient&'s chart.  PMH:  Thyroid disease. Premature atrial contractions. Palpitations. Mitral valve prolapse.  ------------------------------------------------------------------- Study Conclusions  - Left ventricle: The cavity size was normal. Systolic function was   normal. The estimated ejection fraction was in the range of 55%   to 60%. Wall motion was normal; there were no regional wall   motion abnormalities. Doppler parameters are consistent with   abnormal left ventricular relaxation (grade 1 diastolic   dysfunction). Doppler parameters are consistent with elevated   ventricular end-diastolic filling pressure. - Aortic valve: There was no regurgitation. - Aortic root: The aortic root was normal in size. - Mitral valve: There was moderate to severe regurgitation. - Right ventricle: Systolic function was normal. - Tricuspid valve: There was mild regurgitation. - Pulmonic valve: There was trivial regurgitation. - Pulmonary arteries: Systolic pressure was within the normal   range. - Inferior vena cava: The vessel was normal in size. - Pericardium, extracardiac: There was no pericardial  effusion.  Impressions:  - Myxomatous, moderately thickened valve with bileaflet prolapse.   Mitral regurgitation jet is central and at least moderate to   severe with reversal of forward flow in the pulmonary veins.   However, left atrial size is normal and so are right sided   pressures. A TEE is recommended for further evaluation.  ------------------------------------------------------------------- Study data:   Study status:  Routine.  Procedure:  The patient reported no pain pre or post test. Transthoracic echocardiography for left ventricular function evaluation, for right ventricular function evaluation, and for assessment of valvular function. Image quality was adequate.  Study completion:  There were no complications.          Transthoracic echocardiography.  M-mode, complete 2D, spectral Doppler, and color Doppler.  Birthdate: Patient birthdate: 1946/05/03.  Age:  Patient is 72 yr old.  Sex: Gender: female.    BMI: 20.9 kg/m^2.  Blood pressure:     102/64 Patient status:  Outpatient.  Study date:  Study date: 06/17/2018. Study time: 11:23 AM.  Location:  [facility]  -------------------------------------------------------------------  ------------------------------------------------------------------- Left ventricle:  The cavity size was normal. Systolic function was normal. The estimated ejection fraction was in the range of 55% to 60%. Wall motion was normal; there were no regional wall motion abnormalities. Doppler parameters are consistent with abnormal left ventricular relaxation (grade 1 diastolic dysfunction).  Doppler parameters are consistent with elevated ventricular end-diastolic filling pressure.  ------------------------------------------------------------------- Aortic valve:   Trileaflet; normal thickness leaflets. Mobility was not restricted.  Doppler:  Transvalvular velocity was within the normal range. There was no stenosis. There was no  regurgitation.   ------------------------------------------------------------------- Aorta:  Aortic root: The aortic root was normal in size.  ------------------------------------------------------------------- Mitral valve:  Myxomatous, moderately thickened valve with bileaflet prolapse. Mobility was not restricted.  Doppler: Transvalvular velocity was within the normal range. There was no evidence for stenosis. There was moderate to severe regurgitation.   Valve area by pressure half-time: 2.2 cm^2. Indexed valve area by pressure half-time: 1.27 cm^2/m^2. Valve area by continuity equation (using LVOT flow): 1.48 cm^2. Indexed valve area by continuity equation (using LVOT flow): 0.86 cm^2/m^2.    Mean gradient (D): 3 mm Hg. Peak gradient (D): 3 mm Hg.  ------------------------------------------------------------------- Left atrium:  The atrium was normal in size.  ------------------------------------------------------------------- Right ventricle:  The cavity size was normal. Wall thickness was normal. Systolic function was normal.  ------------------------------------------------------------------- Pulmonic valve:    Structurally normal valve.   Cusp separation was normal.  Doppler:  Transvalvular velocity was within the normal range. There was no evidence for stenosis. There was trivial regurgitation.  ------------------------------------------------------------------- Tricuspid valve:   Structurally normal valve.    Doppler: Transvalvular velocity was within the normal range. There was mild regurgitation.  ------------------------------------------------------------------- Pulmonary artery:   The main pulmonary artery was normal-sized. Systolic pressure was within the normal range.  ------------------------------------------------------------------- Right atrium:  The atrium was normal in  size.  ------------------------------------------------------------------- Pericardium:  There was no pericardial effusion.  ------------------------------------------------------------------- Systemic veins: Inferior vena cava: The vessel was normal in size.  ------------------------------------------------------------------- Measurements   Left ventricle                           Value          Reference  LV ID, ED, PLAX chordal                  45    mm       43 - 52  LV ID, ES, PLAX chordal                  28    mm       23 - 38  LV fx shortening, PLAX chordal           38    %        >=29  LV PW thickness, ED                      10    mm       ----------  IVS/LV PW ratio, ED                      0.7            <=1.3  Stroke volume, 2D                        47    ml       ----------  Stroke volume/bsa, 2D                    27    ml/m^2   ----------  LV e&', lateral  7.51  cm/s     ----------  LV E/e&', lateral                         11.65          ----------  LV e&', medial                            6.96  cm/s     ----------  LV E/e&', medial                          12.57          ----------  LV e&', average                           7.24  cm/s     ----------  LV E/e&', average                         12.09          ----------    Ventricular septum                       Value          Reference  IVS thickness, ED                        7     mm       ----------    LVOT                                     Value          Reference  LVOT ID, S                               17    mm       ----------  LVOT area                                2.27  cm^2     ----------  LVOT ID                                  17    mm       ----------  LVOT peak velocity, S                    91.9  cm/s     ----------  LVOT mean velocity, S                    63    cm/s     ----------  LVOT VTI, S                              20.9  cm       ----------  Stroke  volume (SV), LVOT DP              47.4  ml       ----------  Stroke index (SV/bsa), LVOT DP           27.5  ml/m^2   ----------    Aorta                                    Value          Reference  Aortic root ID, ED                       32    mm       ----------    Left atrium                              Value          Reference  LA ID, A-P, ES                           36    mm       ----------  LA ID/bsa, A-P                           2.09  cm/m^2   <=2.2  LA volume, S                             54.2  ml       ----------  LA volume/bsa, S                         31.4  ml/m^2   ----------  LA volume, ES, 1-p A4C                   51.2  ml       ----------  LA volume/bsa, ES, 1-p A4C               29.7  ml/m^2   ----------  LA volume, ES, 1-p A2C                   47.5  ml       ----------  LA volume/bsa, ES, 1-p A2C               27.5  ml/m^2   ----------    Mitral valve                             Value          Reference  Mitral E-wave peak velocity              87.5  cm/s     ----------  Mitral A-wave peak velocity              103   cm/s     ----------  Mitral mean velocity, D                  70.9  cm/s     ----------  Mitral deceleration time         (H)     232   ms       150 - 230  Mitral pressure half-time  97    ms       ----------  Mitral mean gradient, D                  3     mm Hg    ----------  Mitral peak gradient, D                  3     mm Hg    ----------  Mitral E/A ratio, peak                   0.8            ----------  Mitral valve area, PHT, DP               2.2   cm^2     ----------  Mitral valve area/bsa, PHT, DP           1.27  cm^2/m^2 ----------  Mitral valve area, LVOT                  1.48  cm^2     ----------  continuity  Mitral valve area/bsa, LVOT              0.86  cm^2/m^2 ----------  continuity  Mitral annulus VTI, D                    32    cm       ----------  Mitral regurg VTI, PISA                  166   cm        ----------    Tricuspid valve                          Value          Reference  Tricuspid regurg peak velocity           204   cm/s     ----------  Tricuspid peak RV-RA gradient            17    mm Hg    ----------    Right atrium                             Value          Reference  RA ID, S-I, ES, A4C                      40.9  mm       34 - 49  RA area, ES, A4C                         13.2  cm^2     8.3 - 19.5  RA volume, ES, A/L                       33.9  ml       ----------  RA volume/bsa, ES, A/L                   19.6  ml/m^2   ----------    Right ventricle                          Value  Reference  RV ID, minor axis, ED, A4C base          38    mm       ----------  TAPSE                                    24.8  mm       ----------  RV s&', lateral, S                        13.1  cm/s     ----------  Legend: (L)  and  (H)  mark values outside specified reference range.  ------------------------------------------------------------------- Prepared and Electronically Authenticated by  Ena Dawley, M.D. 2019-09-19T16:41:39   Transesophageal Echocardiography  Patient:    Keslie, Gritz MR #:       [id number] Study Date: 06/30/2018 Gender:     F Age:        52 Height:     172.7 cm Weight:     62.1 kg BSA:        1.72 m^2 Pt. Status: Room:   ADMITTING    [name], MD  PERFORMING   [name], MD  ATTENDING    Carylon Perches  REFERRING    Isaiah Serge  SONOGRAPHER  Haroldine Laws  cc:  ------------------------------------------------------------------- LV EF: 60% -   65%  ------------------------------------------------------------------- Indications:      Mitral regurgitation 424.0.  ------------------------------------------------------------------- History:   PMH:   Mitral valve disease.  ------------------------------------------------------------------- Study Conclusions  - Left ventricle:  Systolic function was normal. The estimated   ejection fraction was in the range of 60% to 65%. Wall motion was   normal; there were no regional wall motion abnormalities. - Aortic valve: Trileaflet; mildly thickened leaflets. There was   trivial regurgitation. - Mitral valve: Moderate, holosystolicprolapse, involving the   anterior leaflet and the posterior leaflet. There was moderate to   severe regurgitation. Effective regurgitant orifice (PISA): 0.22   cm^2. Regurgitant volume (PISA): 42 ml. - Left atrium: No evidence of thrombus in the atrial cavity or   appendage. - Right atrium: The atrium was mildly dilated. No evidence of   thrombus in the atrial cavity or appendage. - Tricuspid valve: There was mild regurgitation. - Pulmonic valve: There was trivial regurgitation.  ------------------------------------------------------------------- Study data:   Study status:  Routine.  Consent:  The risks, benefits, and alternatives to the procedure were explained to the patient and informed consent was obtained.  Procedure:  The patient reported no pain pre or post test. Initial setup. The patient was brought to the laboratory. Surface ECG leads were monitored. Sedation. Conscious sedation was administered by cardiology staff. Transesophageal echocardiography. An adult multiplane transesophageal probe was inserted by the attending cardiologistwithout difficulty. Image quality was adequate.  Study completion:  The patient tolerated the procedure well. There were no complications.  Administered medications:   Fentanyl, 35mcg, IV.  Midazolam, 3mg , IV.          Diagnostic transesophageal echocardiography.  2D and color Doppler.  Birthdate:  Patient birthdate: Jan 20, 1946.  Age:  Patient is 72 yr old.  Sex:  Gender: female.    BMI: 20.8 kg/m^2.  Blood pressure:     113/66  Patient status:  Inpatient.  Study date:  Study date: 06/30/2018. Study time: 11:26 AM.  Location:  Endoscopy.  -------------------------------------------------------------------  ------------------------------------------------------------------- Left ventricle:  Systolic function was normal. The estimated ejection fraction was in the range of 60% to 65%. Wall motion was normal; there were no regional wall motion abnormalities.  ------------------------------------------------------------------- Aortic valve:   Trileaflet; mildly thickened leaflets. Cusp separation was normal.  Doppler:  There was trivial regurgitation.   ------------------------------------------------------------------- Aorta:  There was no atheroma. There was no evidence for dissection. Aortic root: The aortic root was not dilated. Ascending aorta: The ascending aorta was normal in size. Aortic arch: The aortic arch was normal in size. Descending aorta: The descending aorta was normal in size.  ------------------------------------------------------------------- Mitral valve:   Mildly thickened leaflets . Leaflet separation was normal.  Moderate, holosystolicprolapse, involving the anterior leaflet and the posterior leaflet.  Doppler:  There was moderate to severe regurgitation.    Peak gradient (D): 97 mm Hg.  ------------------------------------------------------------------- Left atrium:  The atrium was normal in size.  No evidence of thrombus in the atrial cavity or appendage. The appendage was morphologically a left appendage, multilobulated, and of normal size. Emptying velocity was normal.  ------------------------------------------------------------------- Right ventricle:  The cavity size was normal. Wall thickness was normal. Systolic function was normal.  ------------------------------------------------------------------- Pulmonic valve:    Structurally normal valve.    Doppler:  There was trivial  regurgitation.  ------------------------------------------------------------------- Tricuspid valve:   Structurally normal valve.   Leaflet separation was normal.  Doppler:  There was mild regurgitation.  ------------------------------------------------------------------- Pulmonary artery:   The main pulmonary artery was normal-sized.  ------------------------------------------------------------------- Right atrium:  The atrium was mildly dilated.  No evidence of thrombus in the atrial cavity or appendage. The appendage was morphologically a right appendage.  ------------------------------------------------------------------- Pericardium:  There was no pericardial effusion.  ------------------------------------------------------------------- Measurements   LVOT                             Value  LVOT ID, S                       16    mm  LVOT area                        2.01  cm^2    Mitral valve                     Value  Mitral E-wave peak velocity      492   cm/s  Mitral peak gradient, D          97    mm Hg  Mitral regurg VTI, PISA          189   cm  Mitral ERO, PISA                 0.22  cm^2  Mitral regurg volume, PISA       42    ml  Legend: (L)  and  (H)  mark values outside specified reference range.  ------------------------------------------------------------------- Prepared and Electronically Authenticated by  [name], MD 2019-10-04T12:52:51    Impression:  Patient has mitral valve prolapse with at least stage B and possibly stage C severe asymptomatic primary mitral regurgitation.  She recently experienced increased symptoms of palpitations without any associated chest pain or shortness of breath.  Event monitor revealed frequent PVCs and PACs, and symptoms have resolved with oral flecainide therapy.  There is no history of atrial fibrillation.  The patient specifically denies any symptoms of exertional shortness of breath or chest discomfort.   She has not appreciated any change in her exercise tolerance, and she remains fairly active physically.   I do not hear a systolic murmur on physical exam.  I have personally reviewed the patient's recent transthoracic and transesophageal echocardiograms.  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  There is at least moderate and possibly severe mitral regurgitation.  The jet of regurgitation is central.  There are no ruptured chordae tendinae and no flail segments of either leaflet.  There does not appear to be significant annular calcification.  There may be some mild annular dilatation.  There appears to be some mild left atrial enlargement, although this was not reported.  The jet of regurgitation seems to wax and wane a little bit in severity depending upon images.  It does not appear that assessment was performed to discern the presence or absence of flow reversal in the pulmonary veins.  Using PISA the ERO was measured 0.22 cm with regurgitant volume estimated 42 mL.    Based upon review of the patient's TEE I feel that the patient's valve could be repaired with high level of confidence and likely very low operative risk.  Options include continued medical therapy with close follow-up versus elective mitral valve repair.     Plan:  I discussed the nature of the patient's primary mitral valve disease and indications for surgical intervention at length with the patient and her husband in the office today.  We directly reviewed images from her recent TEE.  We discussed treatment options including continued medical therapy with close follow-up versus elective mitral valve repair.  All of their questions have been addressed.  I plan to directly review the patient's recent TEE and discussed the case further with Dr. [name].  At this point we are leaning towards continued medical therapy with  close follow-up, including serial echocardiograms every 6 months.  All of the questions have been addressed.   I spent in excess of 90 minutes during the conduct of this office consultation and >50% of this time involved direct face-to-face encounter with the patient for counseling and/or coordination of their care.   Valentina Gu. Roxy Manns, MD 08/31/2018 4:37 PM    Addendum:  I have again personally reviewed the patient's transesophageal echocardiogram and discussed the findings at length with Dr. [name].  We agree that the patient's mitral regurgitation is certainly bordering on severe and probably severe.  However, because the severity is borderline and the patient remains entirely asymptomatic, conservative management seems reasonable at this time.  We plan to have the patient return in 6 months for follow-up echocardiogram.  At that time we will also do a stress echocardiogram to look for signs of worsening mitral regurgitation and/or pulmonary hypertension following exercise.   Valentina Gu. Roxy Manns, MD 09/01/2018 4:03 PM

## 2018-09-01 ENCOUNTER — Telehealth: Payer: Self-pay | Admitting: *Deleted

## 2018-09-01 ENCOUNTER — Encounter: Payer: Self-pay | Admitting: *Deleted

## 2018-09-01 DIAGNOSIS — I340 Nonrheumatic mitral (valve) insufficiency: Secondary | ICD-10-CM

## 2018-09-01 NOTE — Telephone Encounter (Signed)
Below are the instructions provided to the pt on how to prepare for her stress echo, for May 2020.  This was endorsed to her via her mychart account.  DO NOT TAKE YOUR METOPROLOL THE MORNING OF YOUR STRESS ECHO.  YOU SHOULD TAKE ALL YOUR OTHER MEDICATIONS AS PRESCRIBED AND THEY ARE SAFE TO TAKE THE DAY OF THIS TEST. PLEASE MAKE SURE YOU TAKE YOUR FLECAINIDE.  DO NOT EAT, DRINK, OR USE TOBACCO PRODUCTS 4 HOURS PRIOR TO THIS TEST. DRESS PREPARED TO EXERCISE IN A COMFORTABLE 2 PIECE CLOTHING OUTFIT AND WALKING SHOES.

## 2018-09-01 NOTE — Telephone Encounter (Signed)
Contacted the pt to endorse to her that per Dr [name], she wanted me to go ahead and order for her to have a stress echo to be done in May 2020, to further eval her MR and RSVP at rest and on exertion.  Endorsed to the pt that I will place the order in the system and have a [facility] scheduler call her back to arrange this appt for May 2020.  Went over the stress echo instructions with her on the phone and did endorse to her that I will send her these instructions as well through her mychart account. Pt verbalized understanding and agrees with this plan.

## 2018-09-01 NOTE — Telephone Encounter (Signed)
  Dorothy Spark, MD  Nuala Alpha, LPN        Tillman Kazmierski,     1. Please order a stress echocardiogram for [place], Diniz 03/17/2046 in May 2020 and state in the order:   To evaluate degree of mitral regurgitation and RVSP at rest and on exertion.

## 2018-09-03 NOTE — Telephone Encounter (Signed)
RE: schedule STRESS ECHO FOR MAY 2020 PER DR NELSON  Received: Today  Message Contents  Jerlyn Ly, LPN        7-80-04 @ 2 pm she is aware

## 2018-09-15 ENCOUNTER — Telehealth: Payer: Self-pay

## 2018-09-15 MED ORDER — FLECAINIDE ACETATE 50 MG PO TABS: 75.0000 mg | ORAL_TABLET | Freq: Two times a day (BID) | ORAL | 3 refills | Status: DC

## 2018-09-15 NOTE — Telephone Encounter (Signed)
Refilled flecainide that was increased from 50 mg to 75 mg on 08/09/18.

## 2018-10-06 ENCOUNTER — Ambulatory Visit: Payer: PPO | Admitting: Cardiology

## 2018-10-06 ENCOUNTER — Encounter: Payer: Self-pay | Admitting: Cardiology

## 2018-10-06 VITALS — BP 108/72 | HR 67 | Ht 68.0 in | Wt 139.2 lb

## 2018-10-06 DIAGNOSIS — I471 Supraventricular tachycardia: Secondary | ICD-10-CM

## 2018-10-06 DIAGNOSIS — R002 Palpitations: Secondary | ICD-10-CM

## 2018-10-06 DIAGNOSIS — I340 Nonrheumatic mitral (valve) insufficiency: Secondary | ICD-10-CM

## 2018-10-06 NOTE — Progress Notes (Signed)
Cardiology Office Note   Date:  10/06/2018   ID:  Kellie Humphrey, DOB Nov 01, 1945, MRN [id number]  PCP:  Marton Redwood, MD  Cardiologist:  Dr. [name]    No chief complaint on file.     History of Present Illness: Kellie Humphrey is a 73 y.o. female who presents for palpitatons and increased dose of flecainide.    PMH of mitral valve prolapse with mild-moderate MR on last echo 04/2017, PACs, breast cancer s/p lumpectomy and radiation in 2009, interstitial cystitis, and chronic cystitis on macrobid. She previously followed outpatient with Dr. Haroldine Laws for MVP/MR and PAC history, however at her last visit, she was recommended to establish care with [facility] going forward.   She wore a monitor which revealed sinus brady to sinus tach, with freq PVCs. + PACs and SVT with 15 beats. Flecainide was started at 50 mg BID. She is for ETT on Monday to eval QRS. She has fewer PVCs at this point.   Echo with mod to severe MR. Last year it was mild to moderate MR. EF is the same at 55-60%, G1DD, Still without chest pain or SOB. No lightheadedness.   TEE with EF 60-65% Mitral valve: Moderate, holosystolicprolapse, involving the anterior leaflet and the posterior leaflet. There was moderate to severe regurgitation. Effective regurgitant orifice (PISA): 0.22 cm^2. Regurgitant volume (PISA): 42 ml.  I have reviewed her TEE and it showed severe mitral regurgitation with normal right-sided pressures, she was referred to Dr. Roxy Manns, he saw her in November 2019, afterwards we discussed patient's case and considering that she is completely asymptomatic with normal right-sided pressures and very mild murmur we will continue conservative management and plan for stress echocardiogram to evaluate MR and right-sided pressures in May 2020.  She received flecainide for SVT and her symptoms have significantly improved, she now gets approximately 2 episodes of palpitation lasting less than 30  seconds a day.  No dizziness presyncope or syncope associated with that.  ETT on Flecainide  No QRS widening.   Past Medical History:  Diagnosis Date  . Arthritis   . Breast cancer ([place])    s/p lumpectomy  . Cataract   . Dense breast 09/06/2013  . Malignant neoplasm of lower-outer quadrant of right breast of female, estrogen receptor positive ([place]) 02/11/2012  . Mitral valve disorder 05/08/2009   Qualifier: Diagnosis of  By: [name], RN, [name]    . MVP (mitral valve prolapse)   . Nonrheumatic mitral valve regurgitation   . PAC (premature atrial contraction)   . Palpitations   . Paresthesias 07/29/2014  . Personal history of breast cancer 09/06/2013  . PREMATURE ATRIAL CONTRACTIONS 05/08/2009   Qualifier: Diagnosis of  By: [name], RN, [name]    . Thyroid disease   . Tremor 12/08/2013  . Varicose veins of right lower extremity with complications 9/38/1017    Past Surgical History:  Procedure Laterality Date  . BREAST BIOPSY  2009  . BREAST LUMPECTOMY    . HEMANGIOMA EXCISION Left 1990   arm  . MELANOMA EXCISION Right 1980   thigh  . NECK SURGERY  10/2009   C5-6 fusion  . SHOULDER ARTHROSCOPY    . TEE WITHOUT CARDIOVERSION N/A 06/30/2018   Procedure: TRANSESOPHAGEAL ECHOCARDIOGRAM (TEE);  Surgeon: Sueanne Margarita, MD;  Location: [facility];  Service: Cardiovascular;  Laterality: N/A;  . TONSILLECTOMY       Current Outpatient Medications  Medication Sig Dispense Refill  . amitriptyline (ELAVIL) 50 MG tablet Take 50 mg  by mouth at bedtime.     . Biotin 5000 MCG CAPS Take 5,000 mcg by mouth daily.      . Carboxymethylcellul-Glycerin (LUBRICATING EYE DROPS OP) Place 1 drop into both eyes daily as needed (dry eyes).    . Cholecalciferol (VITAMIN D) 2000 units tablet Take 2,000 Units by mouth daily.    . Cyanocobalamin (B-12) 5000 MCG CAPS Take 5,000 mcg by mouth 2 (two) times a week.    . diclofenac sodium (VOLTAREN) 1 % GEL APPLY 2-4 GRAMS TO LARGE JOINT AREA UP TO FOUR TIMES A  DAY AS NEEDED 2 Tube 2  . fish oil-omega-3 fatty acids 1000 MG capsule Take 1 g by mouth 2 (two) times daily.     . flecainide (TAMBOCOR) 50 MG tablet Take 1.5 tablets (75 mg total) by mouth 2 (two) times daily. 180 tablet 3  . gabapentin (NEURONTIN) 600 MG tablet Take 600 mg by mouth 2 (two) times daily.    [name] ibuprofen (ADVIL,MOTRIN) 200 MG tablet Take 600 mg by mouth daily as needed for headache or moderate pain.    [name] levothyroxine (SYNTHROID) 50 MCG tablet Take 1 tablet (50 mcg total) by mouth daily before breakfast.    . metoprolol tartrate (LOPRESSOR) 25 MG tablet Take 1 tablet (25 mg total) by mouth 2 (two) times daily. 180 tablet 3  . nitrofurantoin (MACRODANTIN) 50 MG capsule Take 1 capsule (50 mg total) by mouth daily. 30 capsule 6   No current facility-administered medications for this visit.     Allergies:   Codeine    Social History:  The patient  reports that she has never smoked. She has never used smokeless tobacco. She reports current alcohol use. She reports that she does not use drugs.  Family History:  The patient's family history includes Coronary artery disease in her mother; Fibromyalgia in her sister; Heart disease in her maternal grandmother; Hypertension in her mother; Neuropathy in her father; Osteoarthritis in her father; Osteoporosis in her mother; Other in her father; Tremor in her father.   ROS:  General:no colds or fevers, no weight changes Skin:no rashes or ulcers HEENT:no blurred vision, no congestion CV:see HPI PUL:see HPI GI:no diarrhea constipation or melena, no indigestion GU:no hematuria, no dysuria MS:no joint pain, no claudication Neuro:no syncope, no lightheadedness Endo:no diabetes, + thyroid disease  Wt Readings from Last 3 Encounters:  10/06/18 139 lb 3.2 oz (63.1 kg)  08/31/18 138 lb (62.6 kg)  08/12/18 139 lb 6.4 oz (63.2 kg)    PHYSICAL EXAM: VS:  BP 108/72   Pulse 67   Ht 5\' 8"  (1.727 m)   Wt 139 lb 3.2 oz (63.1 kg)   SpO2 99%    BMI 21.17 kg/m  , BMI Body mass index is 21.17 kg/m. General:Pleasant affect, NAD Skin:Warm and dry, brisk capillary refill HEENT:normocephalic, sclera clear, mucus membranes moist Neck:supple, no JVD, no bruits  [id number] RRR 2/6 systolic murmur, gallup, rub or click Lungs:clear without rales, rhonchi, or wheezes ATF:TDDU, non tender, + BS, do not palpate liver spleen or masses Ext:no lower ext edema, 2+ pedal pulses, 2+ radial pulses Neuro:alert and oriented X 3, MAE, follows commands, + facial symmetry  EKG:  EKG is ordered today. The ekg ordered today demonstrates normal sinus rhythm normal EKG unchanged from prior.  Personally reviewed.  Recent Labs: 12/09/2017: ALT 11; Hemoglobin 12.3; Platelets 185 06/09/2018: BUN 12; Creatinine, Ser 0.75; Magnesium 2.2; Potassium 4.0; Sodium 141; TSH 1.190   Lipid Panel No results found for:  CHOL, TRIG, HDL, CHOLHDL, VLDL, LDLCALC, LDLDIRECT   Other studies Reviewed: Additional studies/ records that were reviewed today include: . Study Conclusions  - Left ventricle: The cavity size was normal. Systolic function was normal. The estimated ejection fraction was in the range of 55% to 60%. Wall motion was normal; there were no regional wall motion abnormalities. - Mitral valve: Myxomatous leaflets with mild bileaflet prolapse. Prolapse. There was mild to moderate regurgitation. Valve area by pressure half-time: 1.93 cm^2. Valve area by continuity equation (using LVOT flow): 1.34 cm^2. - Atrial septum: No defect or patent foramen ovale was identified.  TTE 06/17/18 Study Conclusions  - Left ventricle: The cavity size was normal. Systolic function was normal. The estimated ejection fraction was in the range of 55% to 60%. Wall motion was normal; there were no regional wall motion abnormalities. Doppler parameters are consistent with abnormal left ventricular relaxation (grade 1 diastolic dysfunction). Doppler  parameters are consistent with elevated ventricular end-diastolic filling pressure. - Aortic valve: There was no regurgitation. - Aortic root: The aortic root was normal in size. - Mitral valve: There was moderate to severe regurgitation. - Right ventricle: Systolic function was normal. - Tricuspid valve: There was mild regurgitation. - Pulmonic valve: There was trivial regurgitation. - Pulmonary arteries: Systolic pressure was within the normal range. - Inferior vena cava: The vessel was normal in size. - Pericardium, extracardiac: There was no pericardial effusion.  Impressions:  - Myxomatous, moderately thickened valve with bileaflet prolapse. Mitral regurgitation jet is central and at least moderate to severe with reversal of forward flow in the pulmonary veins. However, left atrial size is normal and so are right sided pressures. A TEE is recommended for further evaluation.  Echo TEE 06/30/18 Study Conclusions  - Left ventricle: Systolic function was normal. The estimated ejection fraction was in the range of 60% to 65%. Wall motion was normal; there were no regional wall motion abnormalities. - Aortic valve: Trileaflet; mildly thickened leaflets. There was trivial regurgitation. - Mitral valve: Moderate, holosystolicprolapse, involving the anterior leaflet and the posterior leaflet. There was moderate to severe regurgitation. Effective regurgitant orifice (PISA): 0.22 cm^2. Regurgitant volume (PISA): 42 ml. - Left atrium: No evidence of thrombus in the atrial cavity or appendage. - Right atrium: The atrium was mildly dilated. No evidence of thrombus in the atrial cavity or appendage. - Tricuspid valve: There was mild regurgitation. - Pulmonic valve: There was trivial regurgitation.  ETT on flecanide    Study Highlights     Blood pressure demonstrated a normal response to exercise.  No T wave inversion was noted during  stress.  Upsloping ST segment depression ST segment depression of 1 mm was noted during stress in the II, III, aVF, V6, V5 and V4 leads, and returning to baseline after less than 1 minute of recovery.  The patient experienced no angina during the stress test  Overall, the patient's exercise capacity was excellent.  [facility] Treadmill Score: low risk  Negative stress test without evidence of ischemia at given workload. Excellent exercise capacity. Frequent PVC's noted in early recovery.    48 hour Holter 06/09/18   Sinus bradycardia to sinus tachycardia.  Infrequent PACs and frequent PVCs (1600 in 48 hrs = 1 % of all beats).  SVT runs the longest consisting of 15 beats.   Sinus bradycardia to sinus tachycardia. Infrequent PACs and frequent PVCs (1600 in 48 hrs = 1 % of all beats). SVT runs the longest consisting of 15 beats. Symptoms correlating with PVCs.  ASSESSMENT AND PLAN:  1.  Severe MR per TEE no SOB or fatigue, able to exercise, she has unchanged symptoms and able to exercise/walk 4 times a week, improved SVT with flecainide, she is scheduled for stress echocardiogram on May 13 to evaluate for degree of mitral regurgitation and right-sided pressures, she will be followed by Dr. Roxy Manns on February 28, 2019 I will see her on March 01, 2019.  2.  SVT, PACs and PVCs on 75 mg BID of flecainide with no QRS widening on ETT, frequently improved symptoms.  No episodes of presyncope or syncope.  3.  BP soft with lopressor but no dizziness.   Current medicines are reviewed with the patient today.  The patient Has no concerns regarding medicines.  The following changes have been made:  See above Labs/ tests ordered today include:see above  Disposition:   FU:  see above  Signed, Ena Dawley, MD  10/06/2018 10:45 AM    [address], [place], [place], [place] Phone: [phone]; Fax: [phone]

## 2018-10-06 NOTE — Patient Instructions (Signed)
Medication Instructions:   Your physician recommends that you continue on your current medications as directed. Please refer to the Current Medication list given to you today.     Follow-Up:  March 01, 2019 AT 10 AM WITH DR [name]       If you need a refill on your cardiac medications before your next appointment, please call your pharmacy.

## 2018-10-26 DIAGNOSIS — N958 Other specified menopausal and perimenopausal disorders: Secondary | ICD-10-CM | POA: Diagnosis not present

## 2018-10-26 DIAGNOSIS — M8588 Other specified disorders of bone density and structure, other site: Secondary | ICD-10-CM | POA: Diagnosis not present

## 2018-10-26 DIAGNOSIS — Z01419 Encounter for gynecological examination (general) (routine) without abnormal findings: Secondary | ICD-10-CM | POA: Diagnosis not present

## 2018-10-26 DIAGNOSIS — Z6821 Body mass index (BMI) 21.0-21.9, adult: Secondary | ICD-10-CM | POA: Diagnosis not present

## 2018-11-29 ENCOUNTER — Other Ambulatory Visit: Payer: Self-pay | Admitting: Obstetrics & Gynecology

## 2018-11-29 DIAGNOSIS — Z1231 Encounter for screening mammogram for malignant neoplasm of breast: Secondary | ICD-10-CM

## 2018-12-28 ENCOUNTER — Ambulatory Visit: Payer: PPO

## 2019-01-20 DIAGNOSIS — N3010 Interstitial cystitis (chronic) without hematuria: Secondary | ICD-10-CM | POA: Diagnosis not present

## 2019-01-20 DIAGNOSIS — Z87440 Personal history of urinary (tract) infections: Secondary | ICD-10-CM | POA: Diagnosis not present

## 2019-02-03 ENCOUNTER — Telehealth: Payer: Self-pay | Admitting: *Deleted

## 2019-02-03 NOTE — Telephone Encounter (Signed)
RE: 5/13- stress echo  Received: Today  Message Contents  Laury Deep, RN  Dorothy Spark, MD; Nuala Alpha, LPN; Rexene Alberts, MD; Synthia Innocent        Thanks for update.   Jari Sportsman, once you have a new date for stress echo, let me know and I will r/s her appt with Dr. Roxy Manns.   Thx-Ryan

## 2019-02-03 NOTE — Telephone Encounter (Signed)
-----   Message from Dorothy Spark, MD sent at 02/03/2019  1:55 PM EDT ----- Regarding: RE: 5/13- stress echo She really needs a stress echo to evaluate her MR and change in right sided pressures, I would schedule it for June (when we will probably restart doing them) and postpone Dr Guy Sandifer appointment. Houston Siren ----- Message ----- From: Nuala Alpha, LPN Sent: 03/31/2201  54:27 AM EDT To: Dorothy Spark, MD Subject: Melton Alar: 5/13- stress echo                           ----- Message ----- From: Marcine Matar Sent: 02/03/2019   9:49 AM EDT To: Nuala Alpha, LPN, Lenard Galloway V Subject: 5/13- stress echo                              Hi Cyrene Gharibian,  We are not scheduling stress echo now, Is their an alternative options for the patient.    Jari Sportsman

## 2019-02-09 ENCOUNTER — Other Ambulatory Visit: Payer: Self-pay

## 2019-02-09 ENCOUNTER — Ambulatory Visit (HOSPITAL_COMMUNITY): Payer: PPO | Attending: Cardiovascular Disease

## 2019-02-09 ENCOUNTER — Ambulatory Visit (HOSPITAL_COMMUNITY): Payer: PPO

## 2019-02-09 DIAGNOSIS — I340 Nonrheumatic mitral (valve) insufficiency: Secondary | ICD-10-CM | POA: Diagnosis not present

## 2019-02-16 ENCOUNTER — Telehealth: Payer: Self-pay | Admitting: *Deleted

## 2019-02-16 ENCOUNTER — Encounter: Payer: Self-pay | Admitting: *Deleted

## 2019-02-16 NOTE — Telephone Encounter (Signed)
Virtual Visit Pre-Appointment Phone Call  "(Name), I am calling you today to discuss your upcoming appointment. We are currently trying to limit exposure to the virus that causes COVID-19 by seeing patients at home rather than in the office."  1. "What is the BEST phone number to call the day of the visit?" - include this in appointment notes-YES UPDATED  2. Do you have or have access to (through a family member/friend) a smartphone with video capability that we can use for your visit?" a. If yes - list this number in appt notes as cell (if different from BEST phone #) and list the appointment type as a VIDEO visit in appointment notes-YES UPDATED AND AWARE   3. Confirm consent - "In the setting of the current Covid19 crisis, you are scheduled for a (video) visit with Dr. [name] on 6/2 at 10 am.  Just as we do with many in-office visits, in order for you to participate in this visit, we must obtain consent.  If you'd like, I can send this to your mychart (if signed up) or email for you to review.  Otherwise, I can obtain your verbal consent now.  All virtual visits are billed to your insurance company just like a normal visit would be.  By agreeing to a virtual visit, we'd like you to understand that the technology does not allow for your provider to perform an examination, and thus may limit your provider's ability to fully assess your condition. If your provider identifies any concerns that need to be evaluated in person, we will make arrangements to do so.  Finally, though the technology is pretty good, we cannot assure that it will always work on either your or our end, and in the setting of a video visit, we may have to convert it to a phone-only visit.  In either situation, we cannot ensure that we have a secure connection.  Are you willing to proceed?" STAFF: Did the patient verbally acknowledge consent to telehealth visit? Document YES/NO here: YES PT GAVE VERBAL CONSENT TO TREAT AS WELL  AS CONSENT SENT TO [facility]  4. Advise patient to be prepared - "Two hours prior to your appointment, go ahead and check your blood pressure, pulse, oxygen saturation, and your weight (if you have the equipment to check those) and write them all down. When your visit starts, your provider will ask you for this information. If you have an Apple Watch or Kardia device, please plan to have heart rate information ready on the day of your appointment. Please have a pen and paper handy nearby the day of the visit as well."-YES  5. Give patient instructions for Doxy.me as below if video visit (depending on what platform provider is using)-YES  6. Inform patient they will receive a phone call 15 minutes prior to their appointment time (may be from unknown caller ID) so they should be prepared to [place] has been deemed a candidate for a follow-up tele-health visit to limit community exposure during the Covid-19 pandemic. I spoke with the patient via phone to ensure availability of phone/video source, confirm preferred email & phone number, and discuss instructions and expectations.  I reminded ADDALYNE VANDEHEI to be prepared with any vital sign and/or heart rhythm information that could potentially be obtained via home monitoring, at the time of her visit. I reminded Kellie Humphrey to expect a phone call prior to  her visit.  Nuala Alpha, LPN 3/61/4431 5:40 PM    IF USING  or DOXY.ME - The patient will receive a link just prior to their visit by text.-YES PT AWARE     FULL LENGTH CONSENT FOR TELE-HEALTH VISIT   I hereby voluntarily request, consent and authorize [facility] and its employed or contracted physicians, physician assistants, nurse practitioners or other licensed health care professionals (the Practitioner), to provide me with telemedicine health care services (the Services") as deemed necessary by the treating Practitioner. I  acknowledge and consent to receive the Services by the Practitioner via telemedicine. I understand that the telemedicine visit will involve communicating with the Practitioner through live audiovisual communication technology and the disclosure of certain medical information by electronic transmission. I acknowledge that I have been given the opportunity to request an in-person assessment or other available alternative prior to the telemedicine visit and am voluntarily participating in the telemedicine visit.  I understand that I have the right to withhold or withdraw my consent to the use of telemedicine in the course of my care at any time, without affecting my right to future care or treatment, and that the Practitioner or I may terminate the telemedicine visit at any time. I understand that I have the right to inspect all information obtained and/or recorded in the course of the telemedicine visit and may receive copies of available information for a reasonable fee.  I understand that some of the potential risks of receiving the Services via telemedicine include:   Delay or interruption in medical evaluation due to technological equipment failure or disruption;  Information transmitted may not be sufficient (e.g. poor resolution of images) to allow for appropriate medical decision making by the Practitioner; and/or   In rare instances, security protocols could fail, causing a breach of personal health information.  Furthermore, I acknowledge that it is my responsibility to provide information about my medical history, conditions and care that is complete and accurate to the best of my ability. I acknowledge that Practitioner's advice, recommendations, and/or decision may be based on factors not within their control, such as incomplete or inaccurate data provided by me or distortions of diagnostic images or specimens that may result from electronic transmissions. I understand that the practice of  medicine is not an exact science and that Practitioner makes no warranties or guarantees regarding treatment outcomes. I acknowledge that I will receive a copy of this consent concurrently upon execution via email to the email address I last provided but may also request a printed copy by calling the office of [place].    I understand that my insurance will be billed for this visit.   I have read or had this consent read to me.  I understand the contents of this consent, which adequately explains the benefits and risks of the Services being provided via telemedicine.   I have been provided ample opportunity to ask questions regarding this consent and the Services and have had my questions answered to my satisfaction.  I give my informed consent for the services to be provided through the use of telemedicine in my medical care  By participating in this telemedicine visit I agree to the above. PT GAVE VERBAL CONSENT TO TREAT AS WELL AS AWARE CONSENT TO TREAT SENT TO MYCHART TO REVIEW.

## 2019-02-16 NOTE — Telephone Encounter (Signed)
Pt received her stress echo results via her mychart and noted as a preliminary review on 5/13, but was wondering if Dr [name] had a change to read it, and if so what was her interpretation? Pt states that her mitral valve on preliminary said mild and Dr. Ricard Dillon informed her it was severe.  Pt has an upcoming virtual visit with Dr [name] on 03/01/19.  Told th pt that I would route this message to Dr [name] to review and advise on.  Informed the pt that I will follow-up with her thereafter. Pt verbalized understanding and agrees with this plan.

## 2019-02-17 NOTE — Telephone Encounter (Signed)
I agree with Dr Ricard Dillon that it is severe, and her LVEF is now slightly worse, I would encourage her to have appointment with Dr Ricard Dillon on 6/20.

## 2019-02-17 NOTE — Telephone Encounter (Signed)
Spoke with the pt and informed her that per Dr [name], she agrees with Dr Roxy Manns that it is severe, and her LVEF is now slightly worse, and she wants the pt to follow-up as planned with [place] on 6/2 as virtual visit and with Dr. Roxy Manns 6/8. Pt verbalized understanding and agrees with this plan.

## 2019-02-28 ENCOUNTER — Ambulatory Visit: Payer: PPO | Admitting: Thoracic Surgery (Cardiothoracic Vascular Surgery)

## 2019-03-01 ENCOUNTER — Encounter: Payer: Self-pay | Admitting: *Deleted

## 2019-03-01 ENCOUNTER — Other Ambulatory Visit: Payer: Self-pay

## 2019-03-01 ENCOUNTER — Encounter: Payer: Self-pay | Admitting: Cardiology

## 2019-03-01 ENCOUNTER — Telehealth (INDEPENDENT_AMBULATORY_CARE_PROVIDER_SITE_OTHER): Payer: PPO | Admitting: Cardiology

## 2019-03-01 VITALS — BP 110/69 | HR 63 | Ht 68.0 in | Wt 136.0 lb

## 2019-03-01 DIAGNOSIS — I340 Nonrheumatic mitral (valve) insufficiency: Secondary | ICD-10-CM | POA: Diagnosis not present

## 2019-03-01 DIAGNOSIS — R002 Palpitations: Secondary | ICD-10-CM

## 2019-03-01 DIAGNOSIS — I493 Ventricular premature depolarization: Secondary | ICD-10-CM

## 2019-03-01 DIAGNOSIS — I341 Nonrheumatic mitral (valve) prolapse: Secondary | ICD-10-CM

## 2019-03-01 MED ORDER — METOPROLOL TARTRATE 25 MG PO TABS: 25.0000 mg | ORAL_TABLET | Freq: Two times a day (BID) | ORAL | 3 refills | Status: DC

## 2019-03-01 MED ORDER — FLECAINIDE ACETATE 50 MG PO TABS: 75.0000 mg | ORAL_TABLET | Freq: Two times a day (BID) | ORAL | 3 refills | Status: DC

## 2019-03-01 NOTE — Progress Notes (Signed)
Virtual Visit via Video Note   This visit type was conducted due to national recommendations for restrictions regarding the COVID-19 Pandemic (e.g. social distancing) in an effort to limit this patient's exposure and mitigate transmission in our community.  Due to her co-morbid illnesses, this patient is at least at moderate risk for complications without adequate follow up.  This format is felt to be most appropriate for this patient at this time.  All issues noted in this document were discussed and addressed.  A limited physical exam was performed with this format.  Please refer to the patient's chart for her consent to telehealth for [facility].   Date:  03/01/2019   ID:  Kellie Humphrey, DOB Dec 23, 1945, MRN [id number]  Patient Location: Home Provider Location: Home  PCP:  Marton Redwood, MD  Cardiologist:  Ena Dawley, MD   Evaluation Performed:  Follow-Up Visit  Chief Complaint:  DOE  History of Present Illness:    Kellie Humphrey is a 73 y.o. female with h/o myxomatous mitral valve, mitral valve prolapse and mitral regurgitation.  She has been treated with flecainide for symptomatic PVCs.   She underwent a TEE in October 2019 with LVEF 60-65%, moderate to severe MR,mild pulmonary hypertension. She was followed by Dr Roxy Manns in December 2019 and they decided to wait since she was completely asymptomatic.  She is now stating that she gets more tired and more short of breath. She gets palpitations that are more frequent but not associated with dizziness, presyncope or syncope.   The patient does not have symptoms concerning for COVID-19 infection (fever, chills, cough, or new shortness of breath).    Past Medical History:  Diagnosis Date  . Arthritis   . Breast cancer ([place])    s/p lumpectomy  . Cataract   . Dense breast 09/06/2013  . Malignant neoplasm of lower-outer quadrant of right breast of female, estrogen receptor positive ([place]) 02/11/2012  . Mitral valve disorder  05/08/2009   Qualifier: Diagnosis of  By: [name], RN, [name]    . MVP (mitral valve prolapse)   . Nonrheumatic mitral valve regurgitation   . PAC (premature atrial contraction)   . Palpitations   . Paresthesias 07/29/2014  . Personal history of breast cancer 09/06/2013  . PREMATURE ATRIAL CONTRACTIONS 05/08/2009   Qualifier: Diagnosis of  By: [name], RN, [name]    . Thyroid disease   . Tremor 12/08/2013  . Varicose veins of right lower extremity with complications 0/06/2724   Past Surgical History:  Procedure Laterality Date  . BREAST BIOPSY  2009  . BREAST LUMPECTOMY    . HEMANGIOMA EXCISION Left 1990   arm  . MELANOMA EXCISION Right 1980   thigh  . NECK SURGERY  10/2009   C5-6 fusion  . SHOULDER ARTHROSCOPY    . TEE WITHOUT CARDIOVERSION N/A 06/30/2018   Procedure: TRANSESOPHAGEAL ECHOCARDIOGRAM (TEE);  Surgeon: Sueanne Margarita, MD;  Location: [facility] ENDOSCOPY;  Service: Cardiovascular;  Laterality: N/A;  . TONSILLECTOMY       Current Meds  Medication Sig  . amitriptyline (ELAVIL) 50 MG tablet Take 50 mg by mouth at bedtime.   . Biotin 5000 MCG CAPS Take 5,000 mcg by mouth daily.    . Carboxymethylcellul-Glycerin (LUBRICATING EYE DROPS OP) Place 1 drop into both eyes daily as needed (dry eyes).  . Cholecalciferol (VITAMIN D) 2000 units tablet Take 2,000 Units by mouth daily.  . Cyanocobalamin (B-12) 5000 MCG CAPS Take 5,000 mcg by mouth 2 (two) times a  week.  . diclofenac sodium (VOLTAREN) 1 % GEL APPLY 2-4 GRAMS TO LARGE JOINT AREA UP TO FOUR TIMES A DAY AS NEEDED  . fish oil-omega-3 fatty acids 1000 MG capsule Take 1 g by mouth 2 (two) times daily.   . flecainide (TAMBOCOR) 50 MG tablet Take 1.5 tablets (75 mg total) by mouth 2 (two) times daily.  [name] gabapentin (NEURONTIN) 600 MG tablet Take 600 mg by mouth 2 (two) times daily.  [name] ibuprofen (ADVIL,MOTRIN) 200 MG tablet Take 600 mg by mouth daily as needed for headache or moderate pain.  [name] levothyroxine (SYNTHROID) 50 MCG tablet  Take 1 tablet (50 mcg total) by mouth daily before breakfast.  . metoprolol tartrate (LOPRESSOR) 25 MG tablet Take 1 tablet (25 mg total) by mouth 2 (two) times daily.  . nitrofurantoin (MACRODANTIN) 50 MG capsule Take 1 capsule (50 mg total) by mouth daily.     Allergies:   Codeine   Social History   Tobacco Use  . Smoking status: Never Smoker  . Smokeless tobacco: Never Used  Substance Use Topics  . Alcohol use: Yes    Comment: Socially  . Drug use: No     Family Hx: The patient's family history includes Coronary artery disease in her mother; Fibromyalgia in her sister; Heart disease in her maternal grandmother; Hypertension in her mother; Neuropathy in her father; Osteoarthritis in her father; Osteoporosis in her mother; Other in her father; Tremor in her father. There is no history of Colon polyps, Esophageal cancer, Pancreatic cancer, Stomach cancer, or Rectal cancer.  ROS:   Please see the history of present illness.    All other systems reviewed and are negative.  Prior CV studies:   The following studies were reviewed today:   Labs/Other Tests and Data Reviewed:    EKG:  No ECG reviewed.  Recent Labs: 06/09/2018: BUN 12; Creatinine, Ser 0.75; Magnesium 2.2; Potassium 4.0; Sodium 141; TSH 1.190   Recent Lipid Panel No results found for: CHOL, TRIG, HDL, CHOLHDL, LDLCALC, LDLDIRECT  Wt Readings from Last 3 Encounters:  03/01/19 136 lb (61.7 kg)  02/09/19 139 lb (63 kg)  10/06/18 139 lb 3.2 oz (63.1 kg)     Objective:    Vital Signs:  BP 110/69   Pulse 63   Ht 5\' 8"  (1.727 m)   Wt 136 lb (61.7 kg)   BMI 20.68 kg/m    VITAL SIGNS:  reviewed   ASSESSMENT & PLAN:    Moderate to Severe MR per TEE in October 2019, she underwent a stress echo in May 2020, she has good functional capacity, her MR jet is underestimated on this study, however LVEF is now decreasing, previously 60-65%, now 50-55%, also RVSP increased with exertion from 32 mmHg to 59 mmHg.   She  reports more fatigue and SOB, she is going to follow with Dr Roxy Manns on 03/07/2019, I think she should be considered for a Mitral valve repair in the next 3-6 months.   2.  SVT, PACs and PVCs on 75 mg BID of flecainide with no QRS widening on ETT, frequently improved symptoms.  No episodes of presyncope or syncope.  3.  BP soft with lopressor but no dizziness.   COVID-19 Education: The signs and symptoms of COVID-19 were discussed with the patient and how to seek care for testing (follow up with PCP or arrange E-visit).  The importance of social distancing was discussed today.  Time:   Today, I have spent 25 minutes with the  patient with telehealth technology discussing the above problems.     Medication Adjustments/Labs and Tests Ordered: Current medicines are reviewed at length with the patient today.  Concerns regarding medicines are outlined above.   Tests Ordered: No orders of the defined types were placed in this encounter.   Medication Changes: No orders of the defined types were placed in this encounter.   Disposition:  Follow up in 3 month(s)  Signed, Ena Dawley, MD  03/01/2019 10:25 AM    [place]

## 2019-03-01 NOTE — Patient Instructions (Signed)
Medication Instructions:    Your physician recommends that you continue on your current medications as directed. Please refer to the Current Medication list given to you today.   If you need a refill on your cardiac medications before your next appointment, please call your pharmacy.     Follow-Up:  WITH DR [name] AS A REGULAR OFFICE VISIT ON June 23, 2019 AT 1:40 PM--THIS VISIT WILL BE IN THE OFFICE.

## 2019-03-07 ENCOUNTER — Encounter: Payer: Self-pay | Admitting: Thoracic Surgery (Cardiothoracic Vascular Surgery)

## 2019-03-07 ENCOUNTER — Ambulatory Visit: Payer: PPO | Admitting: Thoracic Surgery (Cardiothoracic Vascular Surgery)

## 2019-03-07 ENCOUNTER — Other Ambulatory Visit: Payer: Self-pay

## 2019-03-07 VITALS — BP 107/70 | HR 67 | Temp 97.5°F | Resp 20 | Ht 68.0 in | Wt 138.0 lb

## 2019-03-07 DIAGNOSIS — I059 Rheumatic mitral valve disease, unspecified: Secondary | ICD-10-CM | POA: Diagnosis not present

## 2019-03-07 DIAGNOSIS — I340 Nonrheumatic mitral (valve) insufficiency: Secondary | ICD-10-CM

## 2019-03-07 NOTE — Progress Notes (Signed)
Kellie Humphrey       [place] [address]             [phone]     CARDIOTHORACIC SURGERY OFFICE NOTE  Referring Provider is Isaiah Serge, NP Primary Cardiologist is Ena Dawley, MD PCP is Marton Redwood, MD   HPI:  Patient is a 73 year old female who returns to the office today for follow-up and surveillance of mitral valve prolapse with moderate to severe mitral regurgitation.  She was originally seen in consultation on August 31, 2018.  At that time she reported no symptoms of exertional shortness of breath or chest discomfort.  She had been experiencing symptoms of palpitations and previous event monitors revealed frequent PVCs and PACs.  Symptoms improved with oral flecainide therapy.  Transthoracic and transesophageal echocardiograms documented the presence of myxomatous degenerative disease of the mitral valve with Barlow's type bileaflet billowing and prolapse and a central jet of regurgitation.  There was no evidence of any ruptured chordae tendinae and the severity of mitral regurgitation was moderately severe by transesophageal echo with ERO measured 0.22 cm and regurgitant volume calculated 42 mL.  Flow reversal in the pulmonary veins was not assessed.  At that time we discussed options at length including continued medical therapy with close follow-up versus elective repair.  The patient was inclined to hold off on surgical intervention at that time.    The patient recently underwent stress echocardiography, and she returns to our office for follow-up today.  The patient exercised on a treadmill for 9 minutes and 30 seconds into stage III of a Bruce protocol achieving 10 METS.  She reportedly developed no symptoms during the exam and the patient states that she felt like she did well.  Resting echocardiogram was reported to demonstrate "normal systolic function with a 55 to 60% ejection fraction".  Post stress left ventricular systolic function remain  normal with a "60 to 65% ejection fraction".  There were no wall motion abnormalities appreciated.  Mitral regurgitation was graded "mild by color-flow Doppler" and the stress test was summarized as a "negative, adequate stress test".  However, right ventricular systolic pressure was notably elevated with an estimated pressure of 51.8 mmHg.  Patient returns her office today for follow-up.  She continues to deny any significant symptoms of exertional shortness of breath or fatigue, although she reports that she feels "more aware" of her breathing when she exercises.  This has not limited her activities at all.  She specifically denies any significant shortness of breath, PND, orthopnea, or lower extremity edema.  The remainder of her review of systems is unchanged.  Current Outpatient Medications  Medication Sig Dispense Refill  . amitriptyline (ELAVIL) 50 MG tablet Take 50 mg by mouth at bedtime.     . Biotin 5000 MCG CAPS Take 5,000 mcg by mouth daily.      . Carboxymethylcellul-Glycerin (LUBRICATING EYE DROPS OP) Place 1 drop into both eyes daily as needed (dry eyes).    . Cholecalciferol (VITAMIN D) 2000 units tablet Take 2,000 Units by mouth daily.    . Cyanocobalamin (B-12) 5000 MCG CAPS Take 5,000 mcg by mouth 2 (two) times a week.    . diclofenac sodium (VOLTAREN) 1 % GEL APPLY 2-4 GRAMS TO LARGE JOINT AREA UP TO FOUR TIMES A DAY AS NEEDED 2 Tube 2  . fish oil-omega-3 fatty acids 1000 MG capsule Take 1 g by mouth 2 (two) times daily.     . flecainide (  TAMBOCOR) 50 MG tablet Take 1.5 tablets (75 mg total) by mouth 2 (two) times daily. 180 tablet 3  . gabapentin (NEURONTIN) 600 MG tablet Take 600 mg by mouth 2 (two) times daily.    [name] ibuprofen (ADVIL,MOTRIN) 200 MG tablet Take 600 mg by mouth daily as needed for headache or moderate pain.    [name] levothyroxine (SYNTHROID) 50 MCG tablet Take 1 tablet (50 mcg total) by mouth daily before breakfast.    . metoprolol tartrate (LOPRESSOR) 25 MG tablet  Take 1 tablet (25 mg total) by mouth 2 (two) times daily. 180 tablet 3  . nitrofurantoin (MACRODANTIN) 50 MG capsule Take 1 capsule (50 mg total) by mouth daily. 30 capsule 6   No current facility-administered medications for this visit.       Physical Exam:   BP 107/70   Pulse 67   Temp (!) 97.5 F (36.4 C) (Skin)   Resp 20   Ht 5\' 8"  (1.727 m)   Wt 138 lb (62.6 kg)   SpO2 98% Comment: RA  BMI 20.98 kg/m   General:  Well-appearing  Chest:   Clear to auscultation  CV:   Regular rate and rhythm, murmur is difficult to appreciate  Incisions:  n/a  Abdomen:  Soft nontender  Extremities:  Warm and well-perfused  Diagnostic Tests:  EXERCISE STRESS REPORT     Patient Name:   Kellie Humphrey Date of Exam: 02/09/2019 Medical Rec #:  [id number]      Height:       68.0 in Accession #:    [id number]     Weight:       139.2 lb Date of Birth:  Mar 30, 1946      BSA:          1.75 m Patient Age:    64 years       BP:           113/75 mmHg Patient Gender: F              HR:           62 bpm. Exam Location:  [address]    Procedure: Stress Echo, Cardiac Doppler and Limited Color Doppler  Indications:    I34.0 Mitral regurgitation   History:        Patient has prior history of Echocardiogram examinations, most                 recent 06/30/2018. SVT. History of severe mitral regurgitation                 History of breast cancer s/p lumpectomy and XRT 2009.   Sonographer:    [facility], RDCS Referring Phys: [id number] [place]  Transthoracic Stress Echo for Pulmonary Pressures and Mitral regurgitation evaluation.  Stress Protocol: The patient exercised on a treadmill for 9 minutes minutes and 30 seconds to stage III of a Bruce protocol, achieving 10 METS. The patient exercised on a treadmill according to a Bruce protocol.  Protocol Termination: The Stress test was discontinued at stage 3 due to leg fatigue.   Resting HR:     64               bpm Resting BP:   113/77 Target HR:      149              bpm Peak BP:     115/66 Peak HR:        120  bpm BP Response: flat % of Target HR: 81               bpm HR Achieved:    was not acheived  Patient Tolerance: The patient developed no symptoms during the stress exam.  EKG: Resting EKG showed normal sinus rhythm at a rate of 64 beats per minute with no abnormal findings. The patient developed no abnormal findings during exercise. There was 1.0 mm of horizontal ST segment depression in leads II, III, AVF, V4, V5, V6 and  V3.  LV Pre-Stress Systolic Function: Pre-stress testing the left ventricular systolic function was normal systolic function with a 16-10% ejection fraction.  LV Wall Scoring: All segments are normal.  LV Wall Scoring: All segments are normal.  LV Post-Stress Systolic Function: Post-stress testing, the left ventricular systolic function was normal systolic function with a 96-04% ejection fraction.              HR  SysBP DiasBP  Symptoms Stage I   82   113    67   no symptoms Stage II  101  142    66   no symptoms Stage III 104  96     69   leg fatigue  RECOVERY           Minutes            HR  SysBP DiasBP  Symptoms Immediately following Stress 116  115    66   no symptoms 2 minutes                    101              no symptoms 3 minutes                    88 4 minutes                    78 5 minutes                    74   136    75   no symptoms    SUMMARY   Left ventricle: Systolic function was normal. The estimated ejection fraction was in the range of 60% to 65%. 38mm ST depression and hypotensive blood pressure response to exercise. Normal systolic function and wall motion noted both at rest and stress. Negative, adequate stress test. Hypotensive blood pressure response in the third stage of exercise. 41mm ST depression noted in the inferolateral leads during recovery.  FINDINGS  Left Ventricle: The left ventricle has normal systolic  function, with an ejection fraction of 55-60%. The cavity size was normal. Left ventricular diastolic function could not be evaluated due to nondiagnostic images. No evidence of left ventricular  regional wall motion abnormalities.  Right Ventricle: The right ventricle has normal systolic function. There is no increase in right ventricular wall thickness. Right ventricular systolic pressure is moderately elevated with an estimated pressure of 51.8 mmHg.  Left Atrium: Left atrial size was normal in size.  Right Atrium: Right atrial size was normal in size. Right atrial pressure is estimated at 8 mmHg.  Mitral Valve: The mitral valve is normal in structure. Mitral valve regurgitation is mild by color flow Doppler.  Tricuspid Valve: Tricuspid valve regurgitation is mild by color flow Doppler.  Aortic Valve: The aortic valve is normal in structure. Aortic valve regurgitation was not visualized by color flow Doppler. There  is No stenosis of the aortic valve.  Pulmonic Valve: The pulmonic valve was normal in structure. Pulmonic valve regurgitation is trivial by color flow Doppler.  Venous: The inferior vena cava measures 2.39 cm, is dilated in size with less than 50% respiratory variability.    +--------------+-------++ LEFT VENTRICLE        +--------------+-------++ PLAX 2D               +--------------+-------++ LVIDd:        4.71 cm +--------------+-------++ LVIDs:        2.71 cm +--------------+-------++ LV PW:        0.57 cm +--------------+-------++ LV IVS:       0.59 cm +--------------+-------++ LV SV:        76 ml   +--------------+-------++ LV SV Index:  43.57   +--------------+-------++                       +--------------+-------++  +---------------+---------++ RIGHT VENTRICLE          +---------------+---------++ RVSP:          58.8 mmHg +---------------+---------++  +------------+----------+++ RIGHT ATRIUM            +------------+----------+++ RA Pressure:15.00 mmHg +------------+----------+++    +-------------+-------++ AORTA                +-------------+-------++ Ao Root diam:3.00 cm +-------------+-------++  +---------------+-----------++ TRICUSPID VALVE            +---------------+-----------++ TR Peak grad:  43.8 mmHg   +---------------+-----------++ TR Vmax:       331.00 cm/s +---------------+-----------++ Estimated RAP: 15.00 mmHg  +---------------+-----------++ RVSP:          58.8 mmHg   +---------------+-----------++  +---------+-------+ IVC              +---------+-------+ IVC diam:2.39 cm +---------+-------+    Skeet Latch MD Electronically signed by Skeet Latch MD Signature Date/Time: 02/09/2019/8:02:30 PM      Impression:  Patient has mitral valve prolapse with at least stage B and possibly stage C asymptomatic mitral regurgitation.  I have reviewed the images from the patient's recent transthoracic stress echocardiogram.  I would dispute the interpretation that the patient's mitral regurgitation is mild but I would agree that it may not be severe.  I am concerned by elevated right ventricular systolic pressure.  At this point the patient remains essentially asymptomatic.  Under the circumstances I think it would be reasonable to continue to follow her closely, but I suspect that she will likely need mitral valve repair within the next few years.   Plan:  I have discussed the results of the patient's recent stress echocardiogram at length with the patient in the office today.  Her husband listened and over the telephone.  She is somewhat confused by the report of the presence of only "mild" mitral regurgitation on her recent stress echocardiogram and yet she was told by Dr. [name] that she probably needs to proceed with surgery in the near future.  I have reassured her that the grading of the severity of  mitral regurgitation can be both dynamic and challenging using transthoracic echocardiogram, particularly in the setting of a stress echo.  She remains reluctant to consider elective surgery at this time and I think it is reasonable to continue to follow her carefully.  We will discuss this at length with Dr. [name].  We tentatively plan follow-up transthoracic echocardiogram in 6 months.  All questions answered.  I spent in  excess of 30 minutes during the conduct of this office consultation and >50% of this time involved direct face-to-face encounter with the patient for counseling and/or coordination of their care.    Valentina Gu. Roxy Manns, MD 03/07/2019 9:26 AM

## 2019-03-07 NOTE — Patient Instructions (Signed)
Continue all previous medications without any changes at this time  

## 2019-03-16 ENCOUNTER — Ambulatory Visit
Admission: RE | Admit: 2019-03-16 | Discharge: 2019-03-16 | Disposition: A | Discharge status: Home / Self Care | Payer: PPO | Source: Ambulatory Visit | Attending: Obstetrics & Gynecology | Admitting: Obstetrics & Gynecology

## 2019-03-16 ENCOUNTER — Other Ambulatory Visit: Payer: Self-pay

## 2019-03-16 DIAGNOSIS — Z1231 Encounter for screening mammogram for malignant neoplasm of breast: Secondary | ICD-10-CM

## 2019-03-17 MED ORDER — AMOXICILLIN 500 MG PO TABS: 2000.0000 mg | ORAL_TABLET | Freq: Once | ORAL | 0 refills | Status: DC

## 2019-03-17 NOTE — Telephone Encounter (Signed)
Verbal orders per Dr [name]: Yes, I would give 2 g of oral amoxicillin prior to dental treatment  Called this into the pts pharmacy of choice.  This is called in for her to take Amoxicillin (2000 mg total), take 4 tablets of the 500 mg tablets of this medication, take 1 hour prior to your dental procedure.  Endorsed these instructions to the pt via her mychart account.

## 2019-05-02 ENCOUNTER — Other Ambulatory Visit: Payer: Self-pay | Admitting: Cardiology

## 2019-05-04 DIAGNOSIS — L723 Sebaceous cyst: Secondary | ICD-10-CM | POA: Diagnosis not present

## 2019-05-04 DIAGNOSIS — L819 Disorder of pigmentation, unspecified: Secondary | ICD-10-CM | POA: Diagnosis not present

## 2019-05-04 DIAGNOSIS — Z85828 Personal history of other malignant neoplasm of skin: Secondary | ICD-10-CM | POA: Diagnosis not present

## 2019-05-04 DIAGNOSIS — D225 Melanocytic nevi of trunk: Secondary | ICD-10-CM | POA: Diagnosis not present

## 2019-05-04 DIAGNOSIS — D2261 Melanocytic nevi of right upper limb, including shoulder: Secondary | ICD-10-CM | POA: Diagnosis not present

## 2019-05-04 DIAGNOSIS — D2262 Melanocytic nevi of left upper limb, including shoulder: Secondary | ICD-10-CM | POA: Diagnosis not present

## 2019-05-04 DIAGNOSIS — Z85820 Personal history of malignant melanoma of skin: Secondary | ICD-10-CM | POA: Diagnosis not present

## 2019-05-04 DIAGNOSIS — L814 Other melanin hyperpigmentation: Secondary | ICD-10-CM | POA: Diagnosis not present

## 2019-05-04 DIAGNOSIS — D1801 Hemangioma of skin and subcutaneous tissue: Secondary | ICD-10-CM | POA: Diagnosis not present

## 2019-05-04 DIAGNOSIS — L821 Other seborrheic keratosis: Secondary | ICD-10-CM | POA: Diagnosis not present

## 2019-06-11 DIAGNOSIS — Z23 Encounter for immunization: Secondary | ICD-10-CM | POA: Diagnosis not present

## 2019-06-16 ENCOUNTER — Other Ambulatory Visit: Payer: Self-pay

## 2019-06-16 DIAGNOSIS — R6889 Other general symptoms and signs: Secondary | ICD-10-CM | POA: Diagnosis not present

## 2019-06-16 DIAGNOSIS — Z20822 Contact with and (suspected) exposure to covid-19: Secondary | ICD-10-CM

## 2019-06-18 LAB — NOVEL CORONAVIRUS, NAA: SARS-CoV-2, NAA: NOT DETECTED

## 2019-06-20 ENCOUNTER — Other Ambulatory Visit: Payer: Self-pay

## 2019-06-20 ENCOUNTER — Ambulatory Visit: Payer: PPO | Admitting: Cardiology

## 2019-06-20 ENCOUNTER — Encounter: Payer: Self-pay | Admitting: Cardiology

## 2019-06-20 VITALS — BP 122/80 | HR 62 | Ht 68.0 in | Wt 140.2 lb

## 2019-06-20 DIAGNOSIS — I340 Nonrheumatic mitral (valve) insufficiency: Secondary | ICD-10-CM

## 2019-06-20 NOTE — Progress Notes (Signed)
Cardiology Office Note   Date:  06/20/2019   ID:  Kellie Humphrey, DOB 1946-03-14, MRN [id number]  PCP:  Kellie Redwood, MD  Cardiologist:  Dr. Meda Humphrey    No chief complaint on file.   History of Present Illness: Kellie Humphrey is a 73 y.o. female with h/o mitral valve prolapse with moderate to severe MR, PACs, breast cancer s/p lumpectomy and radiation in 2009, interstitial cystitis, and chronic cystitis on macrobid. She wore a monitor which revealed sinus brady to sinus tach, with freq PVCs. + PACs and SVT with 15 beats. Flecainide was started at 50 mg BID with resolution of her symptoms.  Echo in September 2019 showed mod to severe MR. EF is the same at 55-60%, G1DD, Still without chest pain or SOB. No lightheadedness.   TEE in October 2019 showed LVEF 60% to 65%. Moderate, holosystolic prolapse, involving the anterior leaflet and the posterior leaflet with moderate to severe regurgitation.   She was referred to Dr. Roxy Humphrey, he saw her in November 2019, afterwards we discussed patient's case and considering that she is completely asymptomatic with normal right-sided pressures and very mild murmur we will continue conservative management and plan for stress echocardiogram to evaluate MR and right-sided pressures in May 2020.  Stress echocardiogram on Feb 09, 2019 showed good exercise capacity, however her heart function is now 50 to 55%, her mitral regurgitation was underestimated but is at least moderate, her right-sided pressure at peak exercise were 59 mmHg.  06/20/2019 - 5 months follow up, today she states that she has been doing well, she walks up to 4 miles 5 times a week with no change in symptoms, she denies any chest pain or shortness of breath no lower extremity edema orthopnea proximal nocturnal dyspnea.  Her palpitations have resolved.   Past Medical History:  Diagnosis Date  . Arthritis   . Breast cancer ([place])    s/p lumpectomy  . Cataract   . Dense breast  09/06/2013  . Malignant neoplasm of lower-outer quadrant of right breast of female, estrogen receptor positive ([place]) 02/11/2012  . Mitral valve disorder 05/08/2009   Qualifier: Diagnosis of  By: Kellie Fillers, RN, Kellie Humphrey    . MVP (mitral valve prolapse)   . Nonrheumatic mitral valve regurgitation   . PAC (premature atrial contraction)   . Palpitations   . Paresthesias 07/29/2014  . Personal history of breast cancer 09/06/2013  . PREMATURE ATRIAL CONTRACTIONS 05/08/2009   Qualifier: Diagnosis of  By: Kellie Fillers, RN, Kellie Humphrey    . Thyroid disease   . Tremor 12/08/2013  . Varicose veins of right lower extremity with complications [id number]    Past Surgical History:  Procedure Laterality Date  . BREAST BIOPSY  2009  . BREAST LUMPECTOMY    . HEMANGIOMA EXCISION Left 1990   arm  . MELANOMA EXCISION Right 1980   thigh  . NECK SURGERY  10/2009   C5-6 fusion  . SHOULDER ARTHROSCOPY    . TEE WITHOUT CARDIOVERSION N/A 06/30/2018   Procedure: TRANSESOPHAGEAL ECHOCARDIOGRAM (TEE);  Surgeon: Kellie Margarita, MD;  Location: [facility] ENDOSCOPY;  Service: Cardiovascular;  Laterality: N/A;  . TONSILLECTOMY       Current Outpatient Medications  Medication Sig Dispense Refill  . amitriptyline (ELAVIL) 50 MG tablet Take 50 mg by mouth at bedtime.     . Biotin 5000 MCG CAPS Take 5,000 mcg by mouth daily.      . Carboxymethylcellul-Glycerin (LUBRICATING EYE DROPS OP) Place 1 drop into both eyes daily  as needed (dry eyes).    . Cholecalciferol (VITAMIN D) 2000 units tablet Take 2,000 Units by mouth daily.    . Cyanocobalamin (B-12) 5000 MCG CAPS Take 5,000 mcg by mouth 2 (two) times a week.    . diclofenac sodium (VOLTAREN) 1 % GEL APPLY 2-4 GRAMS TO LARGE JOINT AREA UP TO FOUR TIMES A DAY AS NEEDED 2 Tube 2  . fish oil-omega-3 fatty acids 1000 MG capsule Take 1 g by mouth 2 (two) times daily.     . flecainide (TAMBOCOR) 50 MG tablet Take 1.5 tablets (75 mg total) by mouth 2 (two) times daily. 180 tablet 3  . gabapentin  (NEURONTIN) 600 MG tablet Take 600 mg by mouth 2 (two) times daily.    [name] ibuprofen (ADVIL,MOTRIN) 200 MG tablet Take 600 mg by mouth daily as needed for headache or moderate pain.    [name] levothyroxine (SYNTHROID) 50 MCG tablet Take 1 tablet (50 mcg total) by mouth daily before breakfast.    . metoprolol tartrate (LOPRESSOR) 25 MG tablet Take 1 tablet (25 mg total) by mouth 2 (two) times daily. 180 tablet 3  . nitrofurantoin (MACRODANTIN) 50 MG capsule Take 1 capsule (50 mg total) by mouth daily. 30 capsule 6   No current facility-administered medications for this visit.     Allergies:   Codeine    Social History:  The patient  reports that she has never smoked. She has never used smokeless tobacco. She reports current alcohol use. She reports that she does not use drugs.  Family History:  The patient's family history includes Coronary artery disease in her mother; Fibromyalgia in her sister; Heart disease in her maternal grandmother; Hypertension in her mother; Neuropathy in her father; Osteoarthritis in her father; Osteoporosis in her mother; Other in her father; Tremor in her father.   ROS:  General:no colds or fevers, no weight changes Skin:no rashes or ulcers HEENT:no blurred vision, no congestion CV:see HPI PUL:see HPI GI:no diarrhea constipation or melena, no indigestion GU:no hematuria, no dysuria MS:no joint pain, no claudication Neuro:no syncope, no lightheadedness Endo:no diabetes, + thyroid disease  Wt Readings from Last 3 Encounters:  06/20/19 140 lb 3.2 oz (63.6 kg)  03/07/19 138 lb (62.6 kg)  03/01/19 136 lb (61.7 kg)    PHYSICAL EXAM: VS:  BP 122/80   Pulse 62   Ht 5\' 8"  (1.727 m)   Wt 140 lb 3.2 oz (63.6 kg)   SpO2 98%   BMI 21.32 kg/m  , BMI Body mass index is 21.32 kg/m. General:Pleasant affect, NAD Skin:Warm and dry, brisk capillary refill HEENT:normocephalic, sclera clear, mucus membranes moist Neck:supple, no JVD, no bruits  [id number] RRR 2/6 systolic  murmur, gallup, rub or click Lungs:clear without rales, rhonchi, or wheezes [id number], non tender, + BS, do not palpate liver spleen or masses Ext:no lower ext edema, 2+ pedal pulses, 2+ radial pulses Neuro:alert and oriented X 3, MAE, follows commands, + facial symmetry  EKG:  EKG is ordered today. The ekg ordered today demonstrates normal sinus rhythm normal EKG unchanged from prior.  Personally reviewed.  Recent Labs: No results found for requested labs within last 8760 hours.   Lipid Panel No results found for: CHOL, TRIG, HDL, CHOLHDL, VLDL, LDLCALC, LDLDIRECT   Other studies Reviewed: Additional studies/ records that were reviewed today include: . Study Conclusions  - Left ventricle: The cavity size was normal. Systolic function was normal. The estimated ejection fraction was in the range of 55% to 60%. Wall motion  was normal; there were no regional wall motion abnormalities. - Mitral valve: Myxomatous leaflets with mild bileaflet prolapse. Prolapse. There was mild to moderate regurgitation. Valve area by pressure half-time: 1.93 cm^2. Valve area by continuity equation (using LVOT flow): 1.34 cm^2. - Atrial septum: No defect or patent foramen ovale was identified.  TTE 06/17/18 Study Conclusions  - Left ventricle: The cavity size was normal. Systolic function was normal. The estimated ejection fraction was in the range of 55% to 60%. Wall motion was normal; there were no regional wall motion abnormalities. Doppler parameters are consistent with abnormal left ventricular relaxation (grade 1 diastolic dysfunction). Doppler parameters are consistent with elevated ventricular end-diastolic filling pressure. - Aortic valve: There was no regurgitation. - Aortic root: The aortic root was normal in size. - Mitral valve: There was moderate to severe regurgitation. - Right ventricle: Systolic function was normal. - Tricuspid valve: There was mild  regurgitation. - Pulmonic valve: There was trivial regurgitation. - Pulmonary arteries: Systolic pressure was within the normal range. - Inferior vena cava: The vessel was normal in size. - Pericardium, extracardiac: There was no pericardial effusion.  Impressions:  - Myxomatous, moderately thickened valve with bileaflet prolapse. Mitral regurgitation jet is central and at least moderate to severe with reversal of forward flow in the pulmonary veins. However, left atrial size is normal and so are right sided pressures. A TEE is recommended for further evaluation.  Echo TEE 06/30/18 Study Conclusions  - Left ventricle: Systolic function was normal. The estimated ejection fraction was in the range of 60% to 65%. Wall motion was normal; there were no regional wall motion abnormalities. - Aortic valve: Trileaflet; mildly thickened leaflets. There was trivial regurgitation. - Mitral valve: Moderate, holosystolicprolapse, involving the anterior leaflet and the posterior leaflet. There was moderate to severe regurgitation. Effective regurgitant orifice (PISA): 0.22 cm^2. Regurgitant volume (PISA): 42 ml. - Left atrium: No evidence of thrombus in the atrial cavity or appendage. - Right atrium: The atrium was mildly dilated. No evidence of thrombus in the atrial cavity or appendage. - Tricuspid valve: There was mild regurgitation. - Pulmonic valve: There was trivial regurgitation.  ETT on flecanide    Study Highlights     Blood pressure demonstrated a normal response to exercise.  No T wave inversion was noted during stress.  Upsloping ST segment depression ST segment depression of 1 mm was noted during stress in the II, III, aVF, V6, V5 and V4 leads, and returning to baseline after less than 1 minute of recovery.  The patient experienced no angina during the stress test  Overall, the patient's exercise capacity was excellent.  [facility] Treadmill  Score: low risk  Negative stress test without evidence of ischemia at given workload. Excellent exercise capacity. Frequent PVC's noted in early recovery.    48 hour Holter 06/09/18   Sinus bradycardia to sinus tachycardia.  Infrequent PACs and frequent PVCs (1600 in 48 hrs = 1 % of all beats).  SVT runs the longest consisting of 15 beats.   Sinus bradycardia to sinus tachycardia. Infrequent PACs and frequent PVCs (1600 in 48 hrs = 1 % of all beats). SVT runs the longest consisting of 15 beats. Symptoms correlating with PVCs.   ASSESSMENT AND PLAN:  1. Moderate to Severe MR per TEE in October 2019, she underwent a stress echo in May 2020, she has good functional capacity, her MR jet is underestimated on this study, but at least moderate, however LVEF is now decreasing, previously 60-65%, now 50-55%,  also RVSP increased with exertion from 32 mmHg to 59 mmHg.  She remains highly functional and asymptomatic.  We will arrange for repeat echocardiogram and follow-up with Dr. Roxy Humphrey.  2.  SVT, PACs and PVCs on 75 mg BID of flecainide with no QRS widening on ETT, frequently improved symptoms.  No episodes of presyncope or syncope.  3.  BP well controlled.  Current medicines are reviewed with the patient today.  The patient Has no concerns regarding medicines.  The following changes have been made:  See above Labs/ tests ordered today include:see above  Disposition:   FU:  see above  Signed, Ena Dawley, MD  06/20/2019 2:00 PM    [address], [place], [place], [place] Phone: [phone]; Fax: [phone]

## 2019-06-20 NOTE — Patient Instructions (Signed)
Medication Instructions:  Your provider recommends that you continue on your current medications as directed. Please refer to the Current Medication list given to you today.    Labwork: None  Testing/Procedures: Your provider has requested that you have an echocardiogram. Echocardiography is a painless test that uses sound waves to create images of your heart. It provides your doctor with information about the size and shape of your heart and how well your heart's chambers and valves are working. This procedure takes approximately one hour. There are no restrictions for this procedure.  Follow-Up: Your provider wants you to follow-up in: 6 months with Dr. [name]. You will receive a reminder letter in the mail two months in advance. If you don't receive a letter, please call our office to schedule the follow-up appointment.    Any Other Special Instructions Will Be Listed Below (If Applicable).     If you need a refill on your cardiac medications before your next appointment, please call your pharmacy.

## 2019-06-23 ENCOUNTER — Ambulatory Visit: Payer: PPO | Admitting: Cardiology

## 2019-06-27 ENCOUNTER — Ambulatory Visit (HOSPITAL_COMMUNITY): Payer: PPO | Attending: Internal Medicine

## 2019-06-27 ENCOUNTER — Other Ambulatory Visit: Payer: Self-pay

## 2019-06-27 DIAGNOSIS — I340 Nonrheumatic mitral (valve) insufficiency: Secondary | ICD-10-CM | POA: Diagnosis not present

## 2019-08-23 DIAGNOSIS — M859 Disorder of bone density and structure, unspecified: Secondary | ICD-10-CM | POA: Diagnosis not present

## 2019-08-23 DIAGNOSIS — E038 Other specified hypothyroidism: Secondary | ICD-10-CM | POA: Diagnosis not present

## 2019-08-30 DIAGNOSIS — I341 Nonrheumatic mitral (valve) prolapse: Secondary | ICD-10-CM | POA: Diagnosis not present

## 2019-08-30 DIAGNOSIS — E039 Hypothyroidism, unspecified: Secondary | ICD-10-CM | POA: Diagnosis not present

## 2019-08-30 DIAGNOSIS — Z0000 Encounter for general adult medical examination without abnormal findings: Secondary | ICD-10-CM | POA: Diagnosis not present

## 2019-08-30 DIAGNOSIS — Z853 Personal history of malignant neoplasm of breast: Secondary | ICD-10-CM | POA: Diagnosis not present

## 2019-08-30 DIAGNOSIS — M8580 Other specified disorders of bone density and structure, unspecified site: Secondary | ICD-10-CM | POA: Diagnosis not present

## 2019-08-30 DIAGNOSIS — R002 Palpitations: Secondary | ICD-10-CM | POA: Diagnosis not present

## 2019-08-30 DIAGNOSIS — Z1331 Encounter for screening for depression: Secondary | ICD-10-CM | POA: Diagnosis not present

## 2019-08-30 DIAGNOSIS — G2581 Restless legs syndrome: Secondary | ICD-10-CM | POA: Diagnosis not present

## 2019-08-30 DIAGNOSIS — I340 Nonrheumatic mitral (valve) insufficiency: Secondary | ICD-10-CM | POA: Diagnosis not present

## 2019-08-30 DIAGNOSIS — Z1339 Encounter for screening examination for other mental health and behavioral disorders: Secondary | ICD-10-CM | POA: Diagnosis not present

## 2019-09-02 ENCOUNTER — Telehealth: Payer: Self-pay | Admitting: Cardiology

## 2019-09-02 MED ORDER — FLECAINIDE ACETATE 50 MG PO TABS: 75.0000 mg | ORAL_TABLET | Freq: Two times a day (BID) | ORAL | 0 refills | Status: DC

## 2019-09-02 NOTE — Telephone Encounter (Signed)
Pt c/o medication issue:  1. Name of Medication: flecainide (TAMBOCOR) 50 MG tablet  2. How are you currently taking this medication (dosage and times per day)? 1.5 tablets by mouth 2 times daily   3. Are you having a reaction (difficulty breathing--STAT)? No  4. What is your medication issue? Patient is calling in regards to pharmacy not giving the right amount of medication for her to take 1.5 tablets twice daily. She states she was only prescribed enough for one tablet daily & is requesting a new prescription is sent over to fix the mix up.

## 2019-09-02 NOTE — Telephone Encounter (Signed)
See where Rx for Flecainide was sent in for on #180 and pt takes 1.5 bid. Resent in rx for #270 per pt request.

## 2019-09-05 ENCOUNTER — Encounter: Payer: PPO | Admitting: Thoracic Surgery (Cardiothoracic Vascular Surgery)

## 2019-09-21 DIAGNOSIS — K921 Melena: Secondary | ICD-10-CM | POA: Diagnosis not present

## 2019-09-26 ENCOUNTER — Telehealth (INDEPENDENT_AMBULATORY_CARE_PROVIDER_SITE_OTHER): Payer: PPO | Admitting: Thoracic Surgery (Cardiothoracic Vascular Surgery)

## 2019-09-26 ENCOUNTER — Other Ambulatory Visit: Payer: Self-pay

## 2019-09-26 ENCOUNTER — Telehealth: Payer: Self-pay | Admitting: *Deleted

## 2019-09-26 DIAGNOSIS — I340 Nonrheumatic mitral (valve) insufficiency: Secondary | ICD-10-CM

## 2019-09-26 DIAGNOSIS — I341 Nonrheumatic mitral (valve) prolapse: Secondary | ICD-10-CM

## 2019-09-26 NOTE — Telephone Encounter (Signed)
-----   Message from Dorothy Spark, MD sent at 09/26/2019 12:43 PM EST ----- Thank you Cub, I agree with you!  Ivy, please can you arrange for her to have TEE on my Reader B day sometimes in February/March?  Thank you![place] Siren ----- Message ----- From: Rexene Alberts, MD Sent: 09/26/2019  12:31 PM EST To: Dorothy Spark, MD  [place] Siren - I think it may be time to repair her valve as she is now reporting mild DOE.  She wants to wait until spring because of COVID - which is more than reasonable.  How about planning TEE in approx 3 months?  I'll see her back after TEE and make plans for surgery.  Thx  Cub

## 2019-09-26 NOTE — Telephone Encounter (Signed)
Spoke with the pt and we will bring her in to see Dr. [name] as a regular OV, and to update H&P,  obtain pre-procedure labs, obtain EKG,for TEE needed on the pt in March 2021.  TEE is needed for work-up, to have mitral valve surgery to be done sometime in March/April, per Dr. Roxy Manns.. Pt is scheduled for her visit for Dr. [name] to arrange TEE for 11/24/2019 at 1000.  Pt is aware that we will do an EKG, obtain labs, and schedule her TEE and Covid screening test to be done, at that time.  Pt verbalized understanding and agrees with this plan.

## 2019-09-26 NOTE — Progress Notes (Signed)
[address]       [place] [address]             [phone]     CARDIOTHORACIC SURGERY TELEPHONE VIRTUAL OFFICE NOTE  Primary Cardiologist is Ena Dawley, MD PCP is Marton Redwood, MD   HPI:  I spoke with Kellie Humphrey (DOB July 07, 1946 ) via telephone on 09/26/2019 at 12:18 PM and verified that I was speaking with the correct person using more than one form of identification.  We discussed the reason(s) for conducting our visit virtually instead of in-person.  The patient expressed understanding the circumstances and agreed to proceed as described.   Patient is a 73 year old female with history of mitral valve prolapse and moderate to severe asymptomatic primary mitral regurgitation.  She was originally seen in consultation on August 31, 2018 and I saw her more recently on March 07, 2019.  At that time she continued to do well and reports no significant symptoms.  She had originally presented with history of palpitations and event monitors revealed frequent PVCs and PACs.  Symptoms improved with oral flecainide therapy.  She underwent stress echocardiography in May 2020 and did well with no significant symptoms of exertional shortness of breath and normal left ventricular systolic function.  Continued medical therapy with close observation was recommended.  I spoke with the patient over the telephone today for routine follow-up.  She underwent transthoracic echocardiogram June 27, 2019 which revealed stable findings with normal left ventricular size and systolic function and bileaflet prolapse with moderate to severe mitral regurgitation.  However, the patient does report that over the past several months she has started to notice a tendency to get mildly short of breath with more strenuous exertion, such as walking up a hill.  Despite this she continues to walk 4 or 5 miles every day without significant problems and she only gets short of breath if she is  walking uphill.  She has not had any resting shortness of breath, PND, orthopnea, or lower extremity edema.  She has not had increasing symptoms of palpitations and she has never had any chest pain or chest tightness.   Current Outpatient Medications  Medication Sig Dispense Refill   amitriptyline (ELAVIL) 50 MG tablet Take 50 mg by mouth at bedtime.      Biotin 5000 MCG CAPS Take 5,000 mcg by mouth daily.       Carboxymethylcellul-Glycerin (LUBRICATING EYE DROPS OP) Place 1 drop into both eyes daily as needed (dry eyes).     Cholecalciferol (VITAMIN D) 2000 units tablet Take 2,000 Units by mouth daily.     Cyanocobalamin (B-12) 5000 MCG CAPS Take 5,000 mcg by mouth 2 (two) times a week.     diclofenac sodium (VOLTAREN) 1 % GEL APPLY 2-4 GRAMS TO LARGE JOINT AREA UP TO FOUR TIMES A DAY AS NEEDED 2 Tube 2   fish oil-omega-3 fatty acids 1000 MG capsule Take 1 g by mouth 2 (two) times daily.      flecainide (TAMBOCOR) 50 MG tablet Take 1.5 tablets (75 mg total) by mouth 2 (two) times daily. 270 tablet 0   gabapentin (NEURONTIN) 600 MG tablet Take 600 mg by mouth 2 (two) times daily.     ibuprofen (ADVIL,MOTRIN) 200 MG tablet Take 600 mg by mouth daily as needed for headache or moderate pain.     levothyroxine (SYNTHROID) 50 MCG tablet Take 1 tablet (50 mcg total) by mouth daily before breakfast.  metoprolol tartrate (LOPRESSOR) 25 MG tablet Take 1 tablet (25 mg total) by mouth 2 (two) times daily. 180 tablet 3   nitrofurantoin (MACRODANTIN) 50 MG capsule Take 1 capsule (50 mg total) by mouth daily. 30 capsule 6   No current facility-administered medications for this visit.     Diagnostic Tests:    ECHOCARDIOGRAM REPORT       Patient Name:   Kellie Humphrey [facility] Date of Exam: 06/27/2019 Medical Rec #:  [id number]          Height:       68.0 in Accession #:    [id number]         Weight:       140.2 lb Date of Birth:  Sep 29, 1946          BSA:          1.76 m Patient Age:     63 years           BP:           122/80 mmHg Patient Gender: F                  HR:           64 bpm. Exam Location:  [facility]  Procedure: 2D Echo, 3D Echo, Color Doppler and Cardiac Doppler  Indications:    I34.0 Non-Rheumatic Mitral Insufficiency   History:        Patient has prior history of Echocardiogram examinations, most                 recent 02/09/2019. Mitral Valve Prolapse; Arrythmias:PAC Risk                 Factors:Family History of Coronary Artery Disease. Palpitations,                 History of Right Breast Cancer status post Lumpectomy.   Sonographer:    Deliah Boston RDCS Referring Phys: [id number] [place]    1. Left ventricular ejection fraction, by visual estimation, is 65 to 70%. The left ventricle has normal function. Normal left ventricular size. There is no left ventricular hypertrophy.  2. Left ventricular diastolic Doppler parameters are consistent with impaired relaxation pattern of LV diastolic filling.  3. Global right ventricle has normal systolic function.The right ventricular size is normal. No increase in right ventricular wall thickness.  4. Left atrial size was normal.  5. Right atrial size was normal.  6. Moderate to severe mitral valve regurgitation.  7. 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  8. The tricuspid valve is normal in structure. Tricuspid valve regurgitation is mild.  9. The aortic valve is tricuspid Aortic valve regurgitation was not visualized by color flow Doppler. Mild aortic valve sclerosis without stenosis. 10. The pulmonic valve was normal in structure. Pulmonic valve regurgitation is mild by color flow Doppler.  FINDINGS  Left Ventricle: Left ventricular ejection fraction, by visual estimation, is 65 to 70%. The left ventricle has normal function. There is no left ventricular hypertrophy. Normal left ventricular size. Spectral  Doppler shows Left ventricular diastolic  Doppler parameters are consistent with impaired relaxation pattern of LV diastolic filling.  Right Ventricle: The right ventricular size is normal. No increase in right ventricular wall thickness. Global RV systolic function is has normal systolic function. The tricuspid regurgitant velocity is 2.28 m/s, and with an assumed right atrial pressure  of 3 mmHg, the estimated right ventricular systolic pressure is normal at 23.8 mmHg.  Left Atrium: Left atrial size was normal in size.  Right Atrium: Right atrial size was normal in size  Pericardium: There is no evidence of pericardial effusion.  Mitral Valve: The mitral valve is abnormal. There is mild prolapse of of the mitral valve. There is mild of the anterior and posterior mitral valve leaflet(s). Moderate to severe mitral valve regurgitation. MV leaflets have myxomatous appearance There is  very mild prolapse of both leaflets There is moderate to severe MR.  Tricuspid Valve: The tricuspid valve is normal in structure. Tricuspid valve regurgitation is mild by color flow Doppler.  Aortic Valve: The aortic valve is tricuspid. Aortic valve regurgitation was not visualized by color flow Doppler. Mild aortic valve sclerosis is present, with no evidence of aortic valve stenosis.  Pulmonic Valve: The pulmonic valve was normal in structure. Pulmonic valve regurgitation is mild by color flow Doppler.  Aorta: The aortic root is normal in size and structure.  IAS/Shunts: No atrial level shunt detected by color flow Doppler.     LEFT VENTRICLE PLAX 2D LVIDd:         3.70 cm  Diastology LVIDs:         2.70 cm  LV e' lateral:   7.94 cm/s LV PW:         0.92 cm  LV E/e' lateral: 11.3 LV IVS:        0.79 cm  LV e' medial:    7.72 cm/s LVOT diam:     2.00 cm  LV E/e' medial:  11.6 LV SV:         31 ml LV SV Index:   17.83 LVOT Area:     3.14 cm    RIGHT VENTRICLE RV S prime:     14.10  cm/s TAPSE (M-mode): 3.2 cm  LEFT ATRIUM         Index LA diam:    2.90 cm 1.65 cm/m  AORTIC VALVE LVOT Vmax:   59.85 cm/s LVOT Vmean:  37.200 cm/s LVOT VTI:    0.137 m   AORTA Ao Root diam: 3.60 cm Ao Asc diam:  3.30 cm  MITRAL VALVE                        TRICUSPID VALVE MV Area (PHT): cm                  TR Peak grad:   20.8 mmHg MV PHT:        msec                 TR Vmax:        248.00 cm/s MV Decel Time: 333 msec MR Peak grad:   102.0 mmHg          SHUNTS MR Mean grad:   62.5 mmHg           Systemic VTI:  0.14 m MR Vmax:        505.00 cm/s         Systemic Diam: 2.00 cm MR Vmean:       370.5 cm/s MR PISA:        1.01 cm MR PISA Radius: 0.40 cm MV E velocity: 89.75 cm/s 103 cm/s MV A velocity: 98.30 cm/s 70.3 cm/s MV E/A ratio:  0.91       1.5    Dorris Carnes MD Electronically signed  by Dorris Carnes MD Signature Date/Time: 06/27/2019/4:19:34 PM       Impression:  Patient has mitral valve prolapse with early stage D severe symptomatic primary mitral regurgitation.  She now reports mild symptoms of exertional shortness of breath with more strenuous physical activity, such as walking up a hill.  Although symptoms remain mild, she has noticed a difference in exercise tolerance.  I have personally reviewed the patient's most recent transthoracic echocardiogram which continues to demonstrate billowing bileaflet prolapse with severe mitral regurgitation.  There are 2 separate jets of regurgitation which show left atrium.  MR Pisa radius was measured 0.40 cm.  Left ventricular size and systolic function remains normal.  The left atrium did not appear dilated and there were not findings suggestive of pulmonary hypertension.  Under the circumstances it may be reasonable to consider elective mitral valve repair at some point in the near future.  Plan:  I discussed the results of the patient's most recent transthoracic echocardiogram and her newly reported symptoms of  exertional shortness of breath at length with the patient over the telephone.  Alternative treatment options including continued medical therapy with close observation versus proceeding with further diagnostic work-up and possible surgical intervention were discussed.  Because of the ongoing COVID-19 pandemic the patient remains somewhat hesitant to consider elective surgery, and under the circumstances I think it is reasonable to wait at least a few more months.  However, I would be disinclined to recommend holding off on surgical intervention indefinitely.  We will have the patient return in approximately 3 months.  Hopefully by then she will have been vaccinated in the COVID-19 pandemic will be under much better control.  Prior to her next office visit we will arrange for her to undergo follow-up transesophageal echocardiogram by Dr. [name] to further characterize the severity of mitral regurgitation and assess the feasibility of mitral valve repair.  Patient has been advised to call and return sooner if she develops worsening shortness of breath and/or significant chest pain.  All of her questions have been addressed.    I discussed limitations of evaluation and management via telephone.  The patient was advised to call back for repeat telephone consultation or to seek an in-person evaluation if questions arise or the patient's clinical condition changes in any significant manner.  I spent in excess of 10 minutes of non-face-to-face time during the conduct of this telephone virtual office consultation.     Valentina Gu. Roxy Manns, MD 09/26/2019 12:18 PM

## 2019-09-30 DIAGNOSIS — R011 Cardiac murmur, unspecified: Secondary | ICD-10-CM

## 2019-09-30 HISTORY — DX: Cardiac murmur, unspecified: R01.1

## 2019-09-30 HISTORY — PX: CARDIAC CATHETERIZATION: SHX172

## 2019-10-10 ENCOUNTER — Encounter: Payer: PPO | Admitting: Thoracic Surgery (Cardiothoracic Vascular Surgery)

## 2019-10-18 ENCOUNTER — Ambulatory Visit: Payer: PPO

## 2019-10-19 ENCOUNTER — Ambulatory Visit: Payer: PPO | Attending: Internal Medicine

## 2019-10-19 DIAGNOSIS — Z23 Encounter for immunization: Secondary | ICD-10-CM | POA: Insufficient documentation

## 2019-10-19 NOTE — Progress Notes (Signed)
   Covid-19 Vaccination Clinic  Name:  Kellie Humphrey    MRN: [id number] DOB: May 20, 1946  10/19/2019  [name] was observed post Covid-19 immunization for 15 minutes without incidence. She was provided with Vaccine Information Sheet and instruction to access the V-Safe system.   [name] was instructed to call 911 with any severe reactions post vaccine: [name] Difficulty breathing  . Swelling of your face and throat  . A fast heartbeat  . A bad rash all over your body  . Dizziness and weakness    Immunizations Administered    Name Date Dose VIS Date Route   Pfizer COVID-19 Vaccine 10/19/2019 10:42 AM 0.3 mL 09/09/2019 Intramuscular   Manufacturer: [place]   Lot: [id number]   [place]: [id number]

## 2019-11-02 DIAGNOSIS — Z6821 Body mass index (BMI) 21.0-21.9, adult: Secondary | ICD-10-CM | POA: Diagnosis not present

## 2019-11-02 DIAGNOSIS — Z124 Encounter for screening for malignant neoplasm of cervix: Secondary | ICD-10-CM | POA: Diagnosis not present

## 2019-11-09 ENCOUNTER — Ambulatory Visit: Payer: PPO | Attending: Internal Medicine

## 2019-11-09 DIAGNOSIS — Z23 Encounter for immunization: Secondary | ICD-10-CM | POA: Insufficient documentation

## 2019-11-09 NOTE — Progress Notes (Signed)
   Covid-19 Vaccination Clinic  Name:  Kellie Humphrey    MRN: [id number] DOB: [birth year]  11/09/2019  [name] was observed post Covid-19 immunization for 15 minutes without incidence. She was provided with Vaccine Information Sheet and instruction to access the V-Safe system.   [name] was instructed to call 911 with any severe reactions post vaccine: [name] Difficulty breathing  . Swelling of your face and throat  . A fast heartbeat  . A bad rash all over your body  . Dizziness and weakness    Immunizations Administered    Name Date Dose VIS Date Route   Pfizer COVID-19 Vaccine 11/09/2019 11:41 AM 0.3 mL 09/09/2019 Intramuscular   Manufacturer: [place]   Lot: [id number]   [place]: [id number]

## 2019-11-18 ENCOUNTER — Ambulatory Visit: Payer: PPO

## 2019-11-23 ENCOUNTER — Encounter: Payer: Self-pay | Admitting: Cardiology

## 2019-11-23 ENCOUNTER — Other Ambulatory Visit: Payer: Self-pay

## 2019-11-23 ENCOUNTER — Ambulatory Visit: Payer: PPO | Admitting: Cardiology

## 2019-11-23 VITALS — BP 118/76 | HR 64 | Ht 67.0 in | Wt 138.4 lb

## 2019-11-23 DIAGNOSIS — I341 Nonrheumatic mitral (valve) prolapse: Secondary | ICD-10-CM

## 2019-11-23 DIAGNOSIS — Z01810 Encounter for preprocedural cardiovascular examination: Secondary | ICD-10-CM

## 2019-11-23 DIAGNOSIS — I493 Ventricular premature depolarization: Secondary | ICD-10-CM | POA: Diagnosis not present

## 2019-11-23 DIAGNOSIS — I340 Nonrheumatic mitral (valve) insufficiency: Secondary | ICD-10-CM

## 2019-11-23 DIAGNOSIS — Z79899 Other long term (current) drug therapy: Secondary | ICD-10-CM

## 2019-11-23 LAB — BASIC METABOLIC PANEL
BUN/Creatinine Ratio: 14 (ref 12–28)
BUN: 11 mg/dL (ref 8–27)
CO2: 26 mmol/L (ref 20–29)
Calcium: 9.5 mg/dL (ref 8.7–10.3)
Chloride: 103 mmol/L (ref 96–106)
Creatinine, Ser: 0.81 mg/dL (ref 0.57–1.00)
GFR calc Af Amer: 85 mL/min/{1.73_m2} (ref >59)
GFR calc non Af Amer: 73 mL/min/{1.73_m2} (ref >59)
Glucose: 83 mg/dL (ref 65–99)
Potassium: 4.3 mmol/L (ref 3.5–5.2)
Sodium: 141 mmol/L (ref 134–144)

## 2019-11-23 LAB — CBC
Hematocrit: 40.7 % (ref 34.0–46.6)
Hemoglobin: 13.6 g/dL (ref 11.1–15.9)
MCH: 30.6 pg (ref 26.6–33.0)
MCHC: 33.4 g/dL (ref 31.5–35.7)
MCV: 92 fL (ref 79–97)
Platelets: 240 10*3/uL (ref 150–450)
RBC: 4.44 x10E6/uL (ref 3.77–5.28)
RDW: 13.1 % (ref 11.7–15.4)
WBC: 4.5 10*3/uL (ref 3.4–10.8)

## 2019-11-23 NOTE — H&P (View-Only) (Signed)
Cardiology Office Note   Date:  11/23/2019   ID:  Kellie Humphrey, DOB [birth year], MRN [id number]  PCP:  Marton Redwood, MD  Cardiologist:  Dr. [name]    Reason for visit: 6 months follow-up, dyspnea on exertion   History of Present Illness: Kellie Humphrey is a 74 y.o. female with h/o mitral valve prolapse with moderate to severe MR, PACs, breast cancer s/p lumpectomy and radiation in 2009, interstitial cystitis, and chronic cystitis on macrobid. She wore a monitor which revealed sinus brady to sinus tach, with freq PVCs. + PACs and SVT with 15 beats. Flecainide was started at 50 mg BID with resolution of her symptoms.  Echo in September 2019 showed mod to severe MR. EF is the same at 55-60%, G1DD, Still without chest pain or SOB. No lightheadedness.   TEE in October 2019 showed LVEF 60% to 65%. Moderate, holosystolic prolapse, involving the anterior leaflet and the posterior leaflet with moderate to severe regurgitation.   She was referred to Dr. Roxy Manns, he saw her in November 2019, afterwards we discussed patient's case and considering that she is completely asymptomatic with normal right-sided pressures and very mild murmur we will continue conservative management and plan for stress echocardiogram to evaluate MR and right-sided pressures in May 2020.  Stress echocardiogram on Feb 09, 2019 showed good exercise capacity, however her heart function is now 50 to 55%, her mitral regurgitation was underestimated but is at least moderate, her right-sided pressure at peak exercise were 59 mmHg.  In September 2020 she was feeling well walking for miles 5 times a week and was completely asymptomatic.   Repeat echocardiogram in September 2020 showed severe MR with jet hitting posterior wall of the left atrium, her LVEF was 60 to 65%.  She saw Dr. Roxy Manns in December via telehealth, at that time they have decided that because of lack of symptoms and ongoing COVID-19 pandemia they would  want to wait until she gets her COVID-19 vaccines and follow-up with Dr. Roxy Manns in 3 months.  11/23/2019 -the patient states that she continues to walk 4 miles 5 times a week but now she feels slightly short of breath when she walks uphill, she is occasional skipped beats but no longer lasting palpitations.  Denies any chest pain.  No lower extremity edema, no orthopnea or proximal nocturnal dyspnea.  Past Medical History:  Diagnosis Date  . Arthritis   . Breast cancer ([place])    s/p lumpectomy  . Cataract   . Dense breast 09/06/2013  . Malignant neoplasm of lower-outer quadrant of right breast of female, estrogen receptor positive ([place]) 02/11/2012  . Mitral valve disorder 05/08/2009   Qualifier: Diagnosis of  By: [name], RN, [name]    . MVP (mitral valve prolapse)   . Nonrheumatic mitral valve regurgitation   . PAC (premature atrial contraction)   . Palpitations   . Paresthesias 07/29/2014  . Personal history of breast cancer 09/06/2013  . PREMATURE ATRIAL CONTRACTIONS 05/08/2009   Qualifier: Diagnosis of  By: [name], RN, [name]    . Thyroid disease   . Tremor 12/08/2013  . Varicose veins of right lower extremity with complications [id number]   Past Surgical History:  Procedure Laterality Date  . BREAST BIOPSY  2009  . BREAST LUMPECTOMY    . HEMANGIOMA EXCISION Left 1990   arm  . MELANOMA EXCISION Right 1980   thigh  . NECK SURGERY  10/2009   C5-6 fusion  . SHOULDER ARTHROSCOPY    .  TEE WITHOUT CARDIOVERSION N/A 06/30/2018   Procedure: TRANSESOPHAGEAL ECHOCARDIOGRAM (TEE);  Surgeon: Sueanne Margarita, MD;  Location: [facility] ENDOSCOPY;  Service: Cardiovascular;  Laterality: N/A;  . TONSILLECTOMY       Current Outpatient Medications  Medication Sig Dispense Refill  . Biotin 5000 MCG CAPS Take 5,000 mcg by mouth daily.      . Carboxymethylcellul-Glycerin (LUBRICATING EYE DROPS OP) Place 1 drop into both eyes daily as needed (dry eyes).    . Cholecalciferol (VITAMIN D) 2000 units tablet Take  2,000 Units by mouth daily.    . Cyanocobalamin (B-12) 5000 MCG CAPS Take 5,000 mcg by mouth 2 (two) times a week.    . diclofenac sodium (VOLTAREN) 1 % GEL APPLY 2-4 GRAMS TO LARGE JOINT AREA UP TO FOUR TIMES A DAY AS NEEDED 2 Tube 2  . fish oil-omega-3 fatty acids 1000 MG capsule Take 1 g by mouth 2 (two) times daily.     . flecainide (TAMBOCOR) 50 MG tablet Take 1.5 tablets (75 mg total) by mouth 2 (two) times daily. 270 tablet 0  . gabapentin (NEURONTIN) 600 MG tablet Take 600 mg by mouth 2 (two) times daily.    [name] ibuprofen (ADVIL,MOTRIN) 200 MG tablet Take 600 mg by mouth daily as needed for headache or moderate pain.    [name] levothyroxine (SYNTHROID) 50 MCG tablet Take 1 tablet (50 mcg total) by mouth daily before breakfast.    . metoprolol tartrate (LOPRESSOR) 25 MG tablet Take 1 tablet (25 mg total) by mouth 2 (two) times daily. 180 tablet 3  . nitrofurantoin (MACRODANTIN) 50 MG capsule Take 1 capsule (50 mg total) by mouth daily. 30 capsule 6   No current facility-administered medications for this visit.    Allergies:   Codeine    Social History:  The patient  reports that she has never smoked. She has never used smokeless tobacco. She reports current alcohol use. She reports that she does not use drugs.  Family History:  The patient's family history includes Coronary artery disease in her mother; Fibromyalgia in her sister; Heart disease in her maternal grandmother; Hypertension in her mother; Neuropathy in her father; Osteoarthritis in her father; Osteoporosis in her mother; Other in her father; Tremor in her father.   ROS:  General:no colds or fevers, no weight changes Skin:no rashes or ulcers HEENT:no blurred vision, no congestion CV:see HPI PUL:see HPI GI:no diarrhea constipation or melena, no indigestion GU:no hematuria, no dysuria MS:no joint pain, no claudication Neuro:no syncope, no lightheadedness Endo:no diabetes, + thyroid disease  Wt Readings from Last 3 Encounters:    11/23/19 138 lb 6.4 oz (62.8 kg)  06/20/19 140 lb 3.2 oz (63.6 kg)  03/07/19 138 lb (62.6 kg)    PHYSICAL EXAM: VS:  BP 118/76   Pulse 64   Ht 5\' 7"  (1.702 m)   Wt 138 lb 6.4 oz (62.8 kg)   SpO2 99%   BMI 21.68 kg/m  , BMI Body mass index is 21.68 kg/m. General:Pleasant affect, NAD Skin:Warm and dry, brisk capillary refill HEENT:normocephalic, sclera clear, mucus membranes moist Neck:supple, no JVD, no bruits  [id number] RRR 2/6 systolic murmur, gallup, rub or click Lungs:clear without rales, rhonchi, or wheezes [id number], non tender, + BS, do not palpate liver spleen or masses Ext:no lower ext edema, 2+ pedal pulses, 2+ radial pulses Neuro:alert and oriented X 3, MAE, follows commands, + facial symmetry  EKG:  EKG is ordered today. The ekg ordered today demonstrates normal sinus rhythm normal EKG unchanged  from prior.  Personally reviewed.  Recent Labs: No results found for requested labs within last 8760 hours.   Lipid Panel No results found for: CHOL, TRIG, HDL, CHOLHDL, VLDL, LDLCALC, LDLDIRECT    TTE 06/17/18 Myxomatous, moderately thickened valve with bileaflet prolapse. Mitral regurgitation jet is central and at least moderate to severe with reversal of forward flow in the pulmonary veins. However, left atrial size is normal and so are right sided pressures. A TEE is recommended for further evaluation.  Echo TEE 06/30/18 - Left ventricle: Systolic function was normal. The estimated ejection fraction was in the range of 60% to 65%. Wall motion was normal; there were no regional wall motion abnormalities. - Aortic valve: Trileaflet; mildly thickened leaflets. There was trivial regurgitation. - Mitral valve: Moderate, holosystolicprolapse, involving the anterior leaflet and the posterior leaflet. There was moderate to severe regurgitation. Effective regurgitant orifice (PISA): 0.22 cm^2. Regurgitant volume (PISA): 42 ml. - Left atrium: No  evidence of thrombus in the atrial cavity or appendage. - Right atrium: The atrium was mildly dilated. No evidence of thrombus in the atrial cavity or appendage. - Tricuspid valve: There was mild regurgitation. - Pulmonic valve: There was trivial regurgitation.  TTE: 05/2019  1. Left ventricular ejection fraction, by visual estimation, is 65 to  70%. The left ventricle has normal function. Normal left ventricular size.  There is no left ventricular hypertrophy.  2. Left ventricular diastolic Doppler parameters are consistent with  impaired relaxation pattern of LV diastolic filling.  3. Global right ventricle has normal systolic function.The right  ventricular size is normal. No increase in right ventricular wall  thickness.  4. Left atrial size was normal.  5. Right atrial size was normal.  6. Moderate to severe mitral valve regurgitation.  7. MV leaflets have myxomatous appearance There is mild prolapse of both  leaflets There is moderate to severe MR.  8. The tricuspid valve is normal in structure. Tricuspid valve  regurgitation is mild.  9. The aortic valve is tricuspid Aortic valve regurgitation was not  visualized by color flow Doppler. Mild aortic valve sclerosis without  stenosis.  10. The pulmonic valve was normal in structure. Pulmonic valve  regurgitation is mild by color flow Doppler.   ETT on flecanide    Study Highlights     Blood pressure demonstrated a normal response to exercise.  No T wave inversion was noted during stress.  Upsloping ST segment depression ST segment depression of 1 mm was noted during stress in the II, III, aVF, V6, V5 and V4 leads, and returning to baseline after less than 1 minute of recovery.  The patient experienced no angina during the stress test  Overall, the patient's exercise capacity was excellent.  Duke Treadmill Score: low risk  Negative stress test without evidence of ischemia at given workload.  Excellent exercise capacity. Frequent PVC's noted in early recovery.    48 hour Holter 06/09/18   Sinus bradycardia to sinus tachycardia.  Infrequent PACs and frequent PVCs (1600 in 48 hrs = 1 % of all beats).  SVT runs the longest consisting of 15 beats.   Sinus bradycardia to sinus tachycardia. Infrequent PACs and frequent PVCs (1600 in 48 hrs = 1 % of all beats). SVT runs the longest consisting of 15 beats. Symptoms correlating with PVCs.   ASSESSMENT AND PLAN:  1. Severe MR TTE in September 2020, the patient now received both Covid vaccines, we will proceed with TEE on December 12, 2019, we will be scheduled  for follow-up with Dr. Roxy Manns, she is now symptomatic with NYHA class IIa with dyspnea on moderate exertion that is unusual for her.  2.  SVT, PACs and PVCs on 75 mg BID of flecainide with no QRS widening on ETT, frequently improved symptoms.  No episodes of presyncope or syncope.  Her EKG today is unchanged.  3.  BP well controlled.  Current medicines are reviewed with the patient today.  The patient Has no concerns regarding medicines.  The following changes have been made:  See above Labs/ tests ordered today include:see above  Disposition:   FU:  see above  Signed, Ena Dawley, MD  11/23/2019 11:29 AM    [facility] [place], [place], [place], [place] Phone: [phone]; Fax: [phone]

## 2019-11-23 NOTE — Progress Notes (Signed)
Cardiology Office Note   Date:  11/23/2019   ID:  Kellie Humphrey, DOB [birth year], MRN [id number]  PCP:  Marton Redwood, MD  Cardiologist:  Dr. [name]    Reason for visit: 6 months follow-up, dyspnea on exertion   History of Present Illness: Kellie Humphrey is a 74 y.o. female with h/o mitral valve prolapse with moderate to severe MR, PACs, breast cancer s/p lumpectomy and radiation in 2009, interstitial cystitis, and chronic cystitis on macrobid. She wore a monitor which revealed sinus brady to sinus tach, with freq PVCs. + PACs and SVT with 15 beats. Flecainide was started at 50 mg BID with resolution of her symptoms.  Echo in September 2019 showed mod to severe MR. EF is the same at 55-60%, G1DD, Still without chest pain or SOB. No lightheadedness.   TEE in October 2019 showed LVEF 60% to 65%. Moderate, holosystolic prolapse, involving the anterior leaflet and the posterior leaflet with moderate to severe regurgitation.   She was referred to Dr. Roxy Manns, he saw her in November 2019, afterwards we discussed patient's case and considering that she is completely asymptomatic with normal right-sided pressures and very mild murmur we will continue conservative management and plan for stress echocardiogram to evaluate MR and right-sided pressures in May 2020.  Stress echocardiogram on Feb 09, 2019 showed good exercise capacity, however her heart function is now 50 to 55%, her mitral regurgitation was underestimated but is at least moderate, her right-sided pressure at peak exercise were 59 mmHg.  In September 2020 she was feeling well walking for miles 5 times a week and was completely asymptomatic.   Repeat echocardiogram in September 2020 showed severe MR with jet hitting posterior wall of the left atrium, her LVEF was 60 to 65%.  She saw Dr. Roxy Manns in December via telehealth, at that time they have decided that because of lack of symptoms and ongoing COVID-19 pandemia they would  want to wait until she gets her COVID-19 vaccines and follow-up with Dr. Roxy Manns in 3 months.  11/23/2019 -the patient states that she continues to walk 4 miles 5 times a week but now she feels slightly short of breath when she walks uphill, she is occasional skipped beats but no longer lasting palpitations.  Denies any chest pain.  No lower extremity edema, no orthopnea or proximal nocturnal dyspnea.  Past Medical History:  Diagnosis Date  . Arthritis   . Breast cancer ([place])    s/p lumpectomy  . Cataract   . Dense breast 09/06/2013  . Malignant neoplasm of lower-outer quadrant of right breast of female, estrogen receptor positive ([place]) 02/11/2012  . Mitral valve disorder 05/08/2009   Qualifier: Diagnosis of  By: [name], RN, [name]    . MVP (mitral valve prolapse)   . Nonrheumatic mitral valve regurgitation   . PAC (premature atrial contraction)   . Palpitations   . Paresthesias 07/29/2014  . Personal history of breast cancer 09/06/2013  . PREMATURE ATRIAL CONTRACTIONS 05/08/2009   Qualifier: Diagnosis of  By: [name], RN, [name]    . Thyroid disease   . Tremor 12/08/2013  . Varicose veins of right lower extremity with complications [id number]   Past Surgical History:  Procedure Laterality Date  . BREAST BIOPSY  2009  . BREAST LUMPECTOMY    . HEMANGIOMA EXCISION Left 1990   arm  . MELANOMA EXCISION Right 1980   thigh  . NECK SURGERY  10/2009   C5-6 fusion  . SHOULDER ARTHROSCOPY    .  TEE WITHOUT CARDIOVERSION N/A 06/30/2018   Procedure: TRANSESOPHAGEAL ECHOCARDIOGRAM (TEE);  Surgeon: Sueanne Margarita, MD;  Location: [facility] ENDOSCOPY;  Service: Cardiovascular;  Laterality: N/A;  . TONSILLECTOMY       Current Outpatient Medications  Medication Sig Dispense Refill  . Biotin 5000 MCG CAPS Take 5,000 mcg by mouth daily.      . Carboxymethylcellul-Glycerin (LUBRICATING EYE DROPS OP) Place 1 drop into both eyes daily as needed (dry eyes).    . Cholecalciferol (VITAMIN D) 2000 units tablet Take  2,000 Units by mouth daily.    . Cyanocobalamin (B-12) 5000 MCG CAPS Take 5,000 mcg by mouth 2 (two) times a week.    . diclofenac sodium (VOLTAREN) 1 % GEL APPLY 2-4 GRAMS TO LARGE JOINT AREA UP TO FOUR TIMES A DAY AS NEEDED 2 Tube 2  . fish oil-omega-3 fatty acids 1000 MG capsule Take 1 g by mouth 2 (two) times daily.     . flecainide (TAMBOCOR) 50 MG tablet Take 1.5 tablets (75 mg total) by mouth 2 (two) times daily. 270 tablet 0  . gabapentin (NEURONTIN) 600 MG tablet Take 600 mg by mouth 2 (two) times daily.    [name] ibuprofen (ADVIL,MOTRIN) 200 MG tablet Take 600 mg by mouth daily as needed for headache or moderate pain.    [name] levothyroxine (SYNTHROID) 50 MCG tablet Take 1 tablet (50 mcg total) by mouth daily before breakfast.    . metoprolol tartrate (LOPRESSOR) 25 MG tablet Take 1 tablet (25 mg total) by mouth 2 (two) times daily. 180 tablet 3  . nitrofurantoin (MACRODANTIN) 50 MG capsule Take 1 capsule (50 mg total) by mouth daily. 30 capsule 6   No current facility-administered medications for this visit.    Allergies:   Codeine    Social History:  The patient  reports that she has never smoked. She has never used smokeless tobacco. She reports current alcohol use. She reports that she does not use drugs.  Family History:  The patient's family history includes Coronary artery disease in her mother; Fibromyalgia in her sister; Heart disease in her maternal grandmother; Hypertension in her mother; Neuropathy in her father; Osteoarthritis in her father; Osteoporosis in her mother; Other in her father; Tremor in her father.   ROS:  General:no colds or fevers, no weight changes Skin:no rashes or ulcers HEENT:no blurred vision, no congestion CV:see HPI PUL:see HPI GI:no diarrhea constipation or melena, no indigestion GU:no hematuria, no dysuria MS:no joint pain, no claudication Neuro:no syncope, no lightheadedness Endo:no diabetes, + thyroid disease  Wt Readings from Last 3 Encounters:    11/23/19 138 lb 6.4 oz (62.8 kg)  06/20/19 140 lb 3.2 oz (63.6 kg)  03/07/19 138 lb (62.6 kg)    PHYSICAL EXAM: VS:  BP 118/76   Pulse 64   Ht 5\' 7"  (1.702 m)   Wt 138 lb 6.4 oz (62.8 kg)   SpO2 99%   BMI 21.68 kg/m  , BMI Body mass index is 21.68 kg/m. General:Pleasant affect, NAD Skin:Warm and dry, brisk capillary refill HEENT:normocephalic, sclera clear, mucus membranes moist Neck:supple, no JVD, no bruits  [id number] RRR 2/6 systolic murmur, gallup, rub or click Lungs:clear without rales, rhonchi, or wheezes [id number], non tender, + BS, do not palpate liver spleen or masses Ext:no lower ext edema, 2+ pedal pulses, 2+ radial pulses Neuro:alert and oriented X 3, MAE, follows commands, + facial symmetry  EKG:  EKG is ordered today. The ekg ordered today demonstrates normal sinus rhythm normal EKG unchanged  from prior.  Personally reviewed.  Recent Labs: No results found for requested labs within last 8760 hours.   Lipid Panel No results found for: CHOL, TRIG, HDL, CHOLHDL, VLDL, LDLCALC, LDLDIRECT    TTE 06/17/18 Myxomatous, moderately thickened valve with bileaflet prolapse. Mitral regurgitation jet is central and at least moderate to severe with reversal of forward flow in the pulmonary veins. However, left atrial size is normal and so are right sided pressures. A TEE is recommended for further evaluation.  Echo TEE 06/30/18 - Left ventricle: Systolic function was normal. The estimated ejection fraction was in the range of 60% to 65%. Wall motion was normal; there were no regional wall motion abnormalities. - Aortic valve: Trileaflet; mildly thickened leaflets. There was trivial regurgitation. - Mitral valve: Moderate, holosystolicprolapse, involving the anterior leaflet and the posterior leaflet. There was moderate to severe regurgitation. Effective regurgitant orifice (PISA): 0.22 cm^2. Regurgitant volume (PISA): 42 ml. - Left atrium: No  evidence of thrombus in the atrial cavity or appendage. - Right atrium: The atrium was mildly dilated. No evidence of thrombus in the atrial cavity or appendage. - Tricuspid valve: There was mild regurgitation. - Pulmonic valve: There was trivial regurgitation.  TTE: 05/2019  1. Left ventricular ejection fraction, by visual estimation, is 65 to  70%. The left ventricle has normal function. Normal left ventricular size.  There is no left ventricular hypertrophy.  2. Left ventricular diastolic Doppler parameters are consistent with  impaired relaxation pattern of LV diastolic filling.  3. Global right ventricle has normal systolic function.The right  ventricular size is normal. No increase in right ventricular wall  thickness.  4. Left atrial size was normal.  5. Right atrial size was normal.  6. Moderate to severe mitral valve regurgitation.  7. MV leaflets have myxomatous appearance There is mild prolapse of both  leaflets There is moderate to severe MR.  8. The tricuspid valve is normal in structure. Tricuspid valve  regurgitation is mild.  9. The aortic valve is tricuspid Aortic valve regurgitation was not  visualized by color flow Doppler. Mild aortic valve sclerosis without  stenosis.  10. The pulmonic valve was normal in structure. Pulmonic valve  regurgitation is mild by color flow Doppler.   ETT on flecanide    Study Highlights     Blood pressure demonstrated a normal response to exercise.  No T wave inversion was noted during stress.  Upsloping ST segment depression ST segment depression of 1 mm was noted during stress in the II, III, aVF, V6, V5 and V4 leads, and returning to baseline after less than 1 minute of recovery.  The patient experienced no angina during the stress test  Overall, the patient's exercise capacity was excellent.  Duke Treadmill Score: low risk  Negative stress test without evidence of ischemia at given workload.  Excellent exercise capacity. Frequent PVC's noted in early recovery.    48 hour Holter 06/09/18   Sinus bradycardia to sinus tachycardia.  Infrequent PACs and frequent PVCs (1600 in 48 hrs = 1 % of all beats).  SVT runs the longest consisting of 15 beats.   Sinus bradycardia to sinus tachycardia. Infrequent PACs and frequent PVCs (1600 in 48 hrs = 1 % of all beats). SVT runs the longest consisting of 15 beats. Symptoms correlating with PVCs.   ASSESSMENT AND PLAN:  1. Severe MR TTE in September 2020, the patient now received both Covid vaccines, we will proceed with TEE on December 12, 2019, we will be scheduled  for follow-up with Dr. Roxy Manns, she is now symptomatic with NYHA class IIa with dyspnea on moderate exertion that is unusual for her.  2.  SVT, PACs and PVCs on 75 mg BID of flecainide with no QRS widening on ETT, frequently improved symptoms.  No episodes of presyncope or syncope.  Her EKG today is unchanged.  3.  BP well controlled.  Current medicines are reviewed with the patient today.  The patient Has no concerns regarding medicines.  The following changes have been made:  See above Labs/ tests ordered today include:see above  Disposition:   FU:  see above  Signed, Ena Dawley, MD  11/23/2019 11:29 AM    [facility] [place], [place], [place], [place] Phone: [phone]; Fax: [phone]

## 2019-11-23 NOTE — Patient Instructions (Addendum)
Medication Instructions: Your physician recommends that you continue on your current medications as directed. Please refer to the Current Medication list given to you today.   *If you need a refill on your cardiac medications before your next appointment, please call your pharmacy*  Lab Work:bmet, cbc If you have labs (blood work) drawn today and your tests are completely normal, you will receive your results only by: [name] MyChart Message (if you have MyChart) OR . A paper copy in the mail If you have any lab test that is abnormal or we need to change your treatment, we will call you to review the results.  Testing/Procedures: Your physician has requested that you have a TEE. During a TEE, sound waves are used to create images of your heart. It provides your doctor with information about the size and shape of your heart and how well your heart's chambers and valves are working. In this test, a transducer is attached to the end of a flexible tube that's guided down your throat and into your esophagus (the tube leading from you mouth to your stomach) to get a more detailed image of your heart. You are not awake for the procedure. Please see the instruction sheet given to you today. For further information please visit [email or web address].     Follow-Up: At [facility], you and your health needs are our priority.  As part of our continuing mission to provide you with exceptional heart care, we have created designated Provider Care Teams.  These Care Teams include your primary Cardiologist (physician) and Advanced Practice Providers (APPs -  Physician Assistants and Nurse Practitioners) who all work together to provide you with the care you need, when you need it.  Your next appointment:   3 month(s)  The format for your next appointment:   In Person  Provider:   Ena Dawley, MD  Other Instructions:  FOLLOW UP WITH DR. OWEN AFTER TEE

## 2019-11-24 ENCOUNTER — Ambulatory Visit: Payer: PPO | Admitting: Cardiology

## 2019-11-28 DIAGNOSIS — H11002 Unspecified pterygium of left eye: Secondary | ICD-10-CM | POA: Diagnosis not present

## 2019-11-28 DIAGNOSIS — H2513 Age-related nuclear cataract, bilateral: Secondary | ICD-10-CM | POA: Diagnosis not present

## 2019-11-30 NOTE — Addendum Note (Signed)
Addended by: Dorothy Spark on: 11/30/2019 02:30 PM   Modules accepted: Orders

## 2019-12-01 ENCOUNTER — Other Ambulatory Visit: Payer: Self-pay

## 2019-12-01 MED ORDER — FLECAINIDE ACETATE 50 MG PO TABS: 75.0000 mg | ORAL_TABLET | Freq: Two times a day (BID) | ORAL | 3 refills | Status: DC

## 2019-12-09 ENCOUNTER — Other Ambulatory Visit (HOSPITAL_COMMUNITY)
Admission: RE | Admit: 2019-12-09 | Discharge: 2019-12-09 | Disposition: A | Discharge status: Home / Self Care | Payer: PPO | Source: Ambulatory Visit | Attending: Cardiology | Admitting: Cardiology

## 2019-12-09 DIAGNOSIS — Z20822 Contact with and (suspected) exposure to covid-19: Secondary | ICD-10-CM | POA: Diagnosis not present

## 2019-12-09 DIAGNOSIS — Z01812 Encounter for preprocedural laboratory examination: Secondary | ICD-10-CM | POA: Diagnosis not present

## 2019-12-09 LAB — SARS CORONAVIRUS 2 (TAT 6-24 HRS): SARS Coronavirus 2: NEGATIVE

## 2019-12-12 ENCOUNTER — Other Ambulatory Visit: Payer: Self-pay

## 2019-12-12 ENCOUNTER — Ambulatory Visit (HOSPITAL_BASED_OUTPATIENT_CLINIC_OR_DEPARTMENT_OTHER)
Admission: RE | Admit: 2019-12-12 | Discharge: 2019-12-12 | Disposition: A | Discharge status: Home / Self Care | Payer: PPO | Source: Home / Self Care | Attending: Cardiology | Admitting: Cardiology

## 2019-12-12 ENCOUNTER — Encounter (HOSPITAL_COMMUNITY): Payer: Self-pay | Admitting: Cardiology

## 2019-12-12 ENCOUNTER — Encounter (HOSPITAL_COMMUNITY)
Admission: RE | Disposition: A | Discharge status: Home / Self Care | Payer: Self-pay | Source: Home / Self Care | Attending: Cardiology

## 2019-12-12 ENCOUNTER — Ambulatory Visit (HOSPITAL_COMMUNITY)
Admission: RE | Admit: 2019-12-12 | Discharge: 2019-12-12 | Disposition: A | Discharge status: Home / Self Care | Payer: PPO | Attending: Cardiology | Admitting: Cardiology

## 2019-12-12 ENCOUNTER — Ambulatory Visit (HOSPITAL_COMMUNITY): Payer: PPO | Admitting: Certified Registered Nurse Anesthetist

## 2019-12-12 DIAGNOSIS — Z885 Allergy status to narcotic agent status: Secondary | ICD-10-CM | POA: Diagnosis not present

## 2019-12-12 DIAGNOSIS — Z79899 Other long term (current) drug therapy: Secondary | ICD-10-CM | POA: Diagnosis not present

## 2019-12-12 DIAGNOSIS — I471 Supraventricular tachycardia: Secondary | ICD-10-CM | POA: Insufficient documentation

## 2019-12-12 DIAGNOSIS — I341 Nonrheumatic mitral (valve) prolapse: Secondary | ICD-10-CM | POA: Diagnosis not present

## 2019-12-12 DIAGNOSIS — I340 Nonrheumatic mitral (valve) insufficiency: Secondary | ICD-10-CM | POA: Insufficient documentation

## 2019-12-12 DIAGNOSIS — C50511 Malignant neoplasm of lower-outer quadrant of right female breast: Secondary | ICD-10-CM | POA: Diagnosis not present

## 2019-12-12 DIAGNOSIS — Z79890 Hormone replacement therapy (postmenopausal): Secondary | ICD-10-CM | POA: Insufficient documentation

## 2019-12-12 DIAGNOSIS — Z853 Personal history of malignant neoplasm of breast: Secondary | ICD-10-CM | POA: Insufficient documentation

## 2019-12-12 DIAGNOSIS — E079 Disorder of thyroid, unspecified: Secondary | ICD-10-CM | POA: Insufficient documentation

## 2019-12-12 DIAGNOSIS — Z170 Estrogen receptor positive status [ER+]: Secondary | ICD-10-CM | POA: Insufficient documentation

## 2019-12-12 DIAGNOSIS — I493 Ventricular premature depolarization: Secondary | ICD-10-CM | POA: Diagnosis not present

## 2019-12-12 DIAGNOSIS — I081 Rheumatic disorders of both mitral and tricuspid valves: Secondary | ICD-10-CM | POA: Diagnosis not present

## 2019-12-12 HISTORY — PX: TEE WITHOUT CARDIOVERSION: SHX5443

## 2019-12-12 SURGERY — ECHOCARDIOGRAM, TRANSESOPHAGEAL
Anesthesia: Monitor Anesthesia Care

## 2019-12-12 MED ORDER — PROPOFOL 500 MG/50ML IV EMUL
INTRAVENOUS | Status: DC | PRN
  Administered 2019-12-12: 75 ug/kg/min via INTRAVENOUS

## 2019-12-12 MED ORDER — SODIUM CHLORIDE 0.9 % IV SOLN: INTRAVENOUS | Status: DC

## 2019-12-12 MED ORDER — PHENYLEPHRINE 40 MCG/ML (10ML) SYRINGE FOR IV PUSH (FOR BLOOD PRESSURE SUPPORT)
PREFILLED_SYRINGE | INTRAVENOUS | Status: DC | PRN
  Administered 2019-12-12: 80 ug via INTRAVENOUS

## 2019-12-12 MED ORDER — PROPOFOL 10 MG/ML IV BOLUS
INTRAVENOUS | Status: DC | PRN
  Administered 2019-12-12 (×2): 20 mg via INTRAVENOUS
  Administered 2019-12-12: 10 mg via INTRAVENOUS

## 2019-12-12 MED ORDER — LACTATED RINGERS IV SOLN: INTRAVENOUS | Status: DC

## 2019-12-12 MED ORDER — EPHEDRINE SULFATE-NACL 50-0.9 MG/10ML-% IV SOSY
PREFILLED_SYRINGE | INTRAVENOUS | Status: DC | PRN
  Administered 2019-12-12 (×4): 10 mg via INTRAVENOUS

## 2019-12-12 NOTE — Interval H&P Note (Signed)
History and Physical Interval Note:  12/12/2019 9:44 AM  Kellie Humphrey  has presented today for surgery, with the diagnosis of mitral regurgitation.  The various methods of treatment have been discussed with the patient and family. After consideration of risks, benefits and other options for treatment, the patient has consented to  Procedure(s): TRANSESOPHAGEAL ECHOCARDIOGRAM (TEE) (N/A) as a surgical intervention.  The patient's history has been reviewed, patient examined, no change in status, stable for surgery.  I have reviewed the patient's chart and labs.  Questions were answered to the patient's satisfaction.     Ena Dawley

## 2019-12-12 NOTE — Anesthesia Postprocedure Evaluation (Signed)
Anesthesia Post Note  Patient: Kellie Humphrey  Procedure(s) Performed: TRANSESOPHAGEAL ECHOCARDIOGRAM (TEE) (N/A )     Patient location during evaluation: Endoscopy Anesthesia Type: MAC Level of consciousness: awake and alert Pain management: pain level controlled Vital Signs Assessment: post-procedure vital signs reviewed and stable Respiratory status: spontaneous breathing, nonlabored ventilation and respiratory function stable Cardiovascular status: stable and blood pressure returned to baseline Postop Assessment: no apparent nausea or vomiting Anesthetic complications: no    Last Vitals:  Vitals:   12/12/19 1110 12/12/19 1115  BP: (!) 89/53 (!) 94/48  Pulse: 65 65  Resp: 15 15  Temp:    SpO2: 100% 100%    Last Pain:  Vitals:   12/12/19 1115  TempSrc:   PainSc: 0-No pain                 Raima Geathers,W. EDMOND

## 2019-12-12 NOTE — Transfer of Care (Signed)
Immediate Anesthesia Transfer of Care Note  Patient: Kellie Humphrey  Procedure(s) Performed: TRANSESOPHAGEAL ECHOCARDIOGRAM (TEE) (N/A )  Patient Location: Endoscopy Unit  Anesthesia Type:MAC  Level of Consciousness: awake, alert , oriented and patient cooperative  Airway & Oxygen Therapy: Patient Spontanous Breathing and Patient connected to nasal cannula oxygen  Post-op Assessment: Report given to RN, Post -op Vital signs reviewed and stable and Patient moving all extremities  Post vital signs: Reviewed and stable  Last Vitals:  Vitals Value Taken Time  BP 97/51 12/12/19 1051  Temp    Pulse 65 12/12/19 1052  Resp 16 12/12/19 1052  SpO2 100 % 12/12/19 1052  Vitals shown include unvalidated device data.  Last Pain:  Vitals:   12/12/19 0915  TempSrc: Temporal         Complications: No apparent anesthesia complications

## 2019-12-12 NOTE — CV Procedure (Signed)
     Transesophageal Echocardiogram Note  Maniah Kratochvil [id number] Nov 15, 1945  Procedure: Transesophageal Echocardiogram Indications: mitral regurgitation  Procedure Details Consent: Obtained Time Out: Verified patient identification, verified procedure, site/side was marked, verified correct patient position, special equipment/implants available, Radiology Safety Procedures followed,  medications/allergies/relevent history reviewed, required imaging and test results available.  Performed  Medications: IV propofol 195 mg administered by anesthesia staff  1. 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 The jet hits the  posterior wall, however there is no reversal of flow in the pulmonary  vains.  2. Left ventricular ejection fraction, by estimation, is 55 to 60%. The  left ventricle has normal function. The left ventricle has no regional  wall motion abnormalities. Left ventricular diastolic function could not  be evaluated.  3. Right ventricular systolic function is normal. The right ventricular  size is normal. There is normal pulmonary artery systolic pressure.  4. Left atrial size was mildly dilated. No left atrial/left atrial  appendage thrombus was detected.  5. The mitral valve is myxomatous. Moderate to severe mitral valve  regurgitation. No evidence of mitral stenosis.  6. The aortic valve is normal in structure. Aortic valve regurgitation is  not visualized. No aortic stenosis is present.  7. The inferior vena cava is normal in size with greater than 50%  respiratory variability, suggesting right atrial pressure of 3 mmHg.  Complications: No apparent complications Patient did tolerate procedure well.  Ena Dawley, MD, [facility] 12/12/2019, 11:41 AM

## 2019-12-12 NOTE — Anesthesia Preprocedure Evaluation (Signed)
Anesthesia Evaluation  Patient identified by MRN, date of birth, ID band Patient awake    Reviewed: Allergy & Precautions, H&P , NPO status , Patient's Chart, lab work & pertinent test results  Airway Mallampati: II  TM Distance: >3 FB Neck ROM: Full    Dental no notable dental hx. (+) Teeth Intact, Dental Advisory Given   Pulmonary shortness of breath and with exertion,    Pulmonary exam normal breath sounds clear to auscultation       Cardiovascular + Valvular Problems/Murmurs MR and MVP  Rhythm:Regular Rate:Normal     Neuro/Psych negative neurological ROS  negative psych ROS   GI/Hepatic negative GI ROS, Neg liver ROS,   Endo/Other  negative endocrine ROS  Renal/GU negative Renal ROS  negative genitourinary   Musculoskeletal  (+) Arthritis , Osteoarthritis,    Abdominal   Peds  Hematology negative hematology ROS (+)   Anesthesia Other Findings   Reproductive/Obstetrics negative OB ROS                             Anesthesia Physical Anesthesia Plan  ASA: II  Anesthesia Plan: MAC   Post-op Pain Management:    Induction: Intravenous  PONV Risk Score and Plan: 2 and Propofol infusion and Treatment may vary due to age or medical condition  Airway Management Planned: Nasal Cannula  Additional Equipment:   Intra-op Plan:   Post-operative Plan:   Informed Consent: I have reviewed the patients History and Physical, chart, labs and discussed the procedure including the risks, benefits and alternatives for the proposed anesthesia with the patient or authorized representative who has indicated his/her understanding and acceptance.     Dental advisory given  Plan Discussed with: CRNA  Anesthesia Plan Comments:         Anesthesia Quick Evaluation

## 2019-12-12 NOTE — Progress Notes (Signed)
Echocardiogram Echocardiogram Transesophageal has been performed.  Oneal Deputy Brityn Mastrogiovanni 12/12/2019, 10:56 AM

## 2019-12-12 NOTE — Discharge Instructions (Signed)

## 2019-12-19 ENCOUNTER — Encounter: Payer: Self-pay | Admitting: Thoracic Surgery (Cardiothoracic Vascular Surgery)

## 2019-12-19 ENCOUNTER — Other Ambulatory Visit: Payer: Self-pay | Admitting: Thoracic Surgery (Cardiothoracic Vascular Surgery)

## 2019-12-19 ENCOUNTER — Other Ambulatory Visit: Payer: Self-pay

## 2019-12-19 ENCOUNTER — Ambulatory Visit: Payer: PPO | Admitting: Thoracic Surgery (Cardiothoracic Vascular Surgery)

## 2019-12-19 VITALS — BP 120/77 | HR 61 | Temp 97.6°F | Resp 20 | Ht 67.0 in | Wt 137.0 lb

## 2019-12-19 DIAGNOSIS — I340 Nonrheumatic mitral (valve) insufficiency: Secondary | ICD-10-CM

## 2019-12-19 DIAGNOSIS — I341 Nonrheumatic mitral (valve) prolapse: Secondary | ICD-10-CM | POA: Diagnosis not present

## 2019-12-19 NOTE — H&P (View-Only) (Signed)
[address]       [place] [address]             [place] REPORT  Referring Provider is Dorothy Spark, MD PCP is Marton Redwood, MD  Chief Complaint  Patient presents with  . Mitral Valve Prolapse    Further discuss surgery TEE 12/12/19    HPI:  Patient is a 74 year old female with history of mitral valve prolapse and moderate to severe asymptomatic primary mitral regurgitation.  She was originally seen in consultation on August 31, 2018 and I saw her more recently on March 07, 2019.  At that time she continued to do well and reports no significant symptoms.  She had originally presented with history of palpitations and event monitors revealed frequent PVCs and PACs.  Symptoms improved with oral flecainide therapy.  She underwent stress echocardiography in May 2020 and did well with no significant symptoms of exertional shortness of breath and normal left ventricular systolic function.  Continued medical therapy with close observation was recommended.  I spoke with the patient over the telephone on September 26, 2019 for routine follow-up.  She previously underwent transthoracic echocardiogram June 27, 2019 which revealed stable findings with normal left ventricular size and systolic function and bileaflet prolapse with moderate to severe mitral regurgitation.  However, the patient does report that over the past several months she has started to notice a tendency to get mildly short of breath with more strenuous exertion, such as walking up a hill.    Since then she was seen in follow-up by Dr. [name] on November 23, 2019 and she subsequently underwent transesophageal echocardiogram on December 12, 2019 TEE revealed bileaflet prolapse with moderate to severe mitral regurgitation.  Left ventricular systolic function remain normal with ejection fraction estimated 55 to 60%.  There was mild left atrial enlargement.  Right  ventricular size and function was normal.  Patient returns her office today with her husband to further discuss the results of her recent TEE and whether or not to consider elective mitral valve repair.  She reports no further decline in her exercise tolerance although she does admit that she tends to get short of breath when walking up a hill.  Despite this she continues to walk 4 or 5 miles every day without significant problems and she only gets short of breath if she is walking uphill.  She has not had any resting shortness of breath, PND, orthopnea, or lower extremity edema.  She has not had increasing symptoms of palpitations and she has never had any chest pain or chest tightness.  Her energy level remains good.  She has not developed any other significant medical problems although she is scheduled to undergo eye surgery within the next week for a corneal problem.   Past Medical History:  Diagnosis Date  . Arthritis   . Breast cancer ([place])    s/p lumpectomy  . Cataract   . Dense breast 09/06/2013  . Malignant neoplasm of lower-outer quadrant of right breast of female, estrogen receptor positive ([place]) 02/11/2012  . Mitral valve disorder 05/08/2009   Qualifier: Diagnosis of  By: [name], RN, [name]    . MVP (mitral valve prolapse)   . Nonrheumatic mitral valve regurgitation   . PAC (premature atrial contraction)   . Palpitations   . Paresthesias 07/29/2014  . Personal history of breast cancer 09/06/2013  . PREMATURE ATRIAL CONTRACTIONS 05/08/2009  Qualifier: Diagnosis of  By: [name], RN, [name]    . Thyroid disease   . Tremor 12/08/2013  . Varicose veins of right lower extremity with complications [id number]    Past Surgical History:  Procedure Laterality Date  . BREAST BIOPSY  2009  . BREAST LUMPECTOMY    . HEMANGIOMA EXCISION Left 1990   arm  . MELANOMA EXCISION Right 1980   thigh  . NECK SURGERY  10/2009   C5-6 fusion  . SHOULDER ARTHROSCOPY    . TEE WITHOUT CARDIOVERSION N/A  06/30/2018   Procedure: TRANSESOPHAGEAL ECHOCARDIOGRAM (TEE);  Surgeon: Sueanne Margarita, MD;  Location: [facility] ENDOSCOPY;  Service: Cardiovascular;  Laterality: N/A;  . TEE WITHOUT CARDIOVERSION N/A 12/12/2019   Procedure: TRANSESOPHAGEAL ECHOCARDIOGRAM (TEE);  Surgeon: Dorothy Spark, MD;  Location: [facility] ENDOSCOPY;  Service: Cardiovascular;  Laterality: N/A;  . TONSILLECTOMY      Family History  Problem Relation Age of Onset  . Coronary artery disease Mother   . Hypertension Mother   . Osteoporosis Mother        wrist fracture  . Osteoarthritis Father   . Neuropathy Father   . Tremor Father   . Other Father        lymphedema  . Fibromyalgia Sister   . Heart disease Maternal Grandmother   . Colon polyps Neg Hx   . Esophageal cancer Neg Hx   . Pancreatic cancer Neg Hx   . Stomach cancer Neg Hx   . Rectal cancer Neg Hx     Social History   Socioeconomic History  . Marital status: Married    Spouse name: [name]  . Number of children: 2  . Years of education: Bachelor  . Highest education level: Not on file  Occupational History  . Not on file  Tobacco Use  . Smoking status: Never Smoker  . Smokeless tobacco: Never Used  Substance and Sexual Activity  . Alcohol use: Yes    Comment: Socially  . Drug use: No  . Sexual activity: Not on file  Other Topics Concern  . Not on file  Social History Narrative   Patient is married to [name], has 2 children   Patient is right handed   Education level is Bachelor    Caffeine consumption is none         Social Determinants of Radio broadcast assistant Strain:   . Difficulty of Paying Living Expenses:   Food Insecurity:   . Worried About Charity fundraiser in the Last Year:   . Arboriculturist in the Last Year:   Transportation Needs:   . Film/video editor (Medical):   [name] Lack of Transportation (Non-Medical):   Physical Activity:   . Days of Exercise per Week:   . Minutes of Exercise per Session:   Stress:   .  Feeling of Stress :   Social Connections:   . Frequency of Communication with Friends and Family:   . Frequency of Social Gatherings with Friends and Family:   . Attends Religious Services:   . Active Member of Clubs or Organizations:   . Attends Archivist Meetings:   [name] Marital Status:   Intimate Partner Violence:   . Fear of Current or Ex-Partner:   . Emotionally Abused:   [name] Physically Abused:   . Sexually Abused:     Current Outpatient Medications  Medication Sig Dispense Refill  . acetaminophen (TYLENOL) 500 MG tablet Take 500-1,000 mg by mouth See  admin instructions. Tale 1000 mg in the morning and 500 mg in the evening    . Biotin 5000 MCG CAPS Take 5,000 mcg by mouth daily.      . Carboxymethylcellul-Glycerin (LUBRICATING EYE DROPS OP) Place 1 drop into both eyes daily as needed (dry eyes). Systain    . Cholecalciferol (VITAMIN D) 2000 units tablet Take 2,000 Units by mouth daily.    . Cyanocobalamin (B-12) 5000 MCG CAPS Take 5,000 mcg by mouth 2 (two) times a week.    . diclofenac sodium (VOLTAREN) 1 % GEL APPLY 2-4 GRAMS TO LARGE JOINT AREA UP TO FOUR TIMES A DAY AS NEEDED (Patient taking differently: Apply 2 g topically daily as needed (pain). ) 2 Tube 2  . diphenhydrAMINE (BENADRYL) 25 MG tablet Take 25 mg by mouth daily as needed for sleep.    . fish oil-omega-3 fatty acids 1000 MG capsule Take 1 g by mouth 2 (two) times daily.     . flecainide (TAMBOCOR) 50 MG tablet Take 1.5 tablets (75 mg total) by mouth 2 (two) times daily. 270 tablet 3  . gabapentin (NEURONTIN) 600 MG tablet Take 600 mg by mouth 2 (two) times daily.    [name] ibuprofen (ADVIL,MOTRIN) 200 MG tablet Take 600 mg by mouth daily as needed for headache or moderate pain.    [name] levothyroxine (SYNTHROID) 50 MCG tablet Take 1 tablet (50 mcg total) by mouth daily before breakfast.    . Melatonin 3 MG TABS Take by mouth as needed.    . metoprolol tartrate (LOPRESSOR) 25 MG tablet Take 1 tablet (25 mg total) by  mouth 2 (two) times daily. 180 tablet 3  . nitrofurantoin (MACRODANTIN) 50 MG capsule Take 1 capsule (50 mg total) by mouth daily. 30 capsule 6   No current facility-administered medications for this visit.    Allergies  Allergen Reactions  . Codeine Nausea And Vomiting      Review of Systems:   General:  normal appetite, normal energy, no weight gain, no weight loss, no fever  Cardiac:  no chest pain with exertion, no chest pain at rest, = SOB with exertion, no resting SOB, no PND, no orthopnea, + palpitations, no arrhythmia, no atrial fibrillation, no LE edema, no dizzy spells, no syncope  Respiratory:  no shortness of breath, no home oxygen, no productive cough, no dry cough, no bronchitis, no wheezing, no hemoptysis, no asthma, no pain with inspiration or cough, no sleep apnea, no CPAP at night  GI:   no difficulty swallowing, no reflux, no frequent heartburn, no hiatal hernia, no abdominal pain, no constipation, no diarrhea, no hematochezia, no hematemesis, no melena  GU:   no dysuria,  no frequency, no urinary tract infection, no hematuria, no kidney stones, no kidney disease  Vascular:  no pain suggestive of claudication, no pain in feet, no leg cramps, no varicose veins, no DVT, no non-healing foot ulcer  Neuro:   no stroke, no TIA's, no seizures, no headaches, no temporary blindness one eye,  no slurred speech, + peripheral neuropathy, no chronic pain, no instability of gait, no memory/cognitive dysfunction  Musculoskeletal: + arthritis, no joint swelling, no myalgias, no difficulty walking, normal mobility   Skin:   no rash, no itching, no skin infections, no pressure sores or ulcerations  Psych:   no anxiety, no depression, no nervousness, no unusual recent stress  Eyes:   + blurry vision, + floaters, + recent vision changes, + wears glasses or contacts  ENT:  no hearing loss, no loose or painful teeth, no dentures, last saw dentist May 2020  Hematologic:  no easy bruising, no  abnormal bleeding, no clotting disorder, no frequent epistaxis  Endocrine:  no diabetes, does not check CBG's at home     Physical Exam:   BP 120/77   Pulse 61   Temp 97.6 F (36.4 C) (Skin)   Resp 20   Ht 5\' 7"  (1.702 m)   Wt 137 lb (62.1 kg)   SpO2 96% Comment: RA  BMI 21.46 kg/m   General:  Thin,  well-appearing  HEENT:  Unremarkable   Neck:   no JVD, no bruits, no adenopathy   Chest:   clear to auscultation, symmetrical breath sounds, no wheezes, no rhonchi   CV:   RRR, grade II/VI holosystolic murmur   Abdomen:  soft, non-tender, no masses   Extremities:  warm, well-perfused, pulses diminished but palpable, no LE edema  Rectal/GU  Deferred  Neuro:   Grossly non-focal and symmetrical throughout  Skin:   Clean and dry, no rashes, no breakdown   Diagnostic Tests:   TRANSESOPHOGEAL ECHO REPORT       Patient Name:  Kellie Humphrey Date of Exam: 12/12/2019  Medical Rec #: [id number]     Height:    67.0 in  Accession #:  [id number]     Weight:    137.0 lb  Date of Birth: 09/17/1946     BSA:     1.722 m  Patient Age:  75 years      BP:      125/74 mmHg  Patient Gender: F         HR:      64 bpm.  Exam Location: Inpatient   Procedure: Transesophageal Echo, 3D Echo, Color Doppler and Cardiac  Doppler   Indications:   I34.1 Nonrheumatic mitral (valve) prolapse    History:     Patient has prior history of Echocardiogram examinations,  most          recent 06/27/2019. Mitral Valve Prolapse.    Sonographer:   Raquel Sarna Senior RDCS  Referring Phys: [id number] [place]  Diagnosing Phys: Ena Dawley MD   PROCEDURE: After discussion of the risks and benefits of a TEE, an  informed consent was obtained from the patient. The transesophogeal probe  was passed without difficulty through the esophogus of the patient.  Sedation performed by different physician.  The patient was monitored  while under deep sedation. Anesthestetic  sedation was provided intravenously by Anesthesiology: 192mg  of Propofol.  The patient's vital signs; including heart rate, blood pressure, and  oxygen saturation; remained stable  throughout the procedure. The patient developed no complications during  the procedure.   IMPRESSIONS    1. 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 The jet hits the  posterior wall, however there is no reversal of flow in the pulmonary  vains.  2. Left ventricular ejection fraction, by estimation, is 55 to 60%. The  left ventricle has normal function. The left ventricle has no regional  wall motion abnormalities. Left ventricular diastolic function could not  be evaluated.  3. Right ventricular systolic function is normal. The right ventricular  size is normal. There is normal pulmonary artery systolic pressure.  4. Left atrial size was mildly dilated. No left atrial/left atrial  appendage thrombus was detected.  5. The mitral valve is myxomatous. Moderate to severe  mitral valve  regurgitation. No evidence of mitral stenosis.  6. The aortic valve is normal in structure. Aortic valve regurgitation is  not visualized. No aortic stenosis is present.  7. The inferior vena cava is normal in size with greater than 50%  respiratory variability, suggesting right atrial pressure of 3 mmHg.   Conclusion(s)/Recommendation(s): Normal biventricular function without  evidence of hemodynamically significant valvular heart disease.   FINDINGS  Left Ventricle: Left ventricular ejection fraction, by estimation, is 55  to 60%. The left ventricle has normal function. The left ventricle has no  regional wall motion abnormalities. The left ventricular internal cavity  size was normal in size. There is  no left ventricular hypertrophy. Left ventricular diastolic function  could not be  evaluated.   Right Ventricle: The right ventricular size is normal. No increase in  right ventricular wall thickness. Right ventricular systolic function is  normal. There is normal pulmonary artery systolic pressure.   Left Atrium: Left atrial size was mildly dilated. No left atrial/left  atrial appendage thrombus was detected.   Right Atrium: Right atrial size was normal in size.   Pericardium: There is no evidence of pericardial effusion.   Mitral Valve: The mitral valve is myxomatous. There is mild prolapse of  both leaflets of the mitral valve. Normal mobility of the mitral valve  leaflets. Moderate to severe mitral valve regurgitation. No evidence of  mitral valve stenosis.   Tricuspid Valve: The tricuspid valve is normal in structure. Tricuspid  valve regurgitation is mild . No evidence of tricuspid stenosis.   Aortic Valve: The aortic valve is normal in structure. Aortic valve  regurgitation is not visualized. No aortic stenosis is present.   Pulmonic Valve: The pulmonic valve was normal in structure. Pulmonic valve  regurgitation is not visualized. No evidence of pulmonic stenosis.   Aorta: The aortic root is normal in size and structure. There is minimal  (Grade I) plaque.   Venous: The inferior vena cava is normal in size with greater than 50%  respiratory variability, suggesting right atrial pressure of 3 mmHg.   IAS/Shunts: No atrial level shunt detected by color flow Doppler.     MR Peak grad:  83.2 mmHg  MR Mean grad:  59.0 mmHg  MR Vmax:     456.00 cm/s  MR Vmean:    366.0 cm/s  MR PISA:     2.26 cm  MR PISA Eff ROA: 19 mm  MR PISA Radius: 0.60 cm   Ena Dawley MD  Electronically signed by Ena Dawley MD  Signature Date/Time: 12/12/2019/11:17:27 AM      Impression:  Patient has mitral valve prolapse with early stage D severe symptomatic primary mitral regurgitation.  Although she remains fairly active physically and  without significant limitation, over the last several months she has noticed a tendency to get short of breath walking up a hill, consistent with chronic diastolic congestive heart failure, New York Heart Association functional class I-II.  I have personally reviewed the patient's recent transesophageal echocardiogram which demonstrates presence of bileaflet prolapse with moderate to severe or severe mitral regurgitation.  There are no ruptured primary chordae tendinae but severe chordal elongation and multiple jets of regurgitation which are directed centrally across the left atrium.  There is mild left atrial enlargement.  There was not any flow reversal in the pulmonary veins.  Left ventricular systolic function remains normal.  Options include continued close observation with follow-up imaging versus elective mitral valve repair.  Based upon review  of the patient's TEE I feel there is a very high likelihood that her valve should be repairable and surgery associated with low operative risk.  The patient would likely be a good candidate for minimally invasive approach to surgery.   Plan:  The patient and her husband were counseled at length regarding the indications, risks and potential benefits of mitral valve repair.  The rationale for elective surgery has been explained, including a comparison between surgery and continued medical therapy with close follow-up.  The likelihood of successful and durable mitral valve repair has been discussed with particular reference to the findings of their recent echocardiogram.  Based upon these findings and previous experience, I have quoted them a greater than 95 percent likelihood of successful valve repair with less than 1 percent risk of mortality or major morbidity.  Alternative surgical approaches have been discussed including a comparison between conventional sternotomy and minimally-invasive techniques.  The relative risks and benefits of each have been  reviewed as they pertain to the patient's specific circumstances, and expectations for the patient's postoperative convalescence has been discussed.  The patient desires to proceed with surgery within the next month or two once she has recovered from her eye surgery.    As a next step the patient will undergo diagnostic cardiac catheterization to rule out the presence of significant coronary artery disease.  She will also undergo CT angiography to evaluate the feasibility of peripheral cannulation for surgery.  Patient will return to our office in approximately 3 weeks to review the results of these tests and possibly schedule surgery at that time.   I spent in excess of 60 minutes during the conduct of this office consultation and >50% of this time involved direct face-to-face encounter with the patient for counseling and/or coordination of their care.   Valentina Gu. Roxy Manns, MD 12/19/2019 10:40 AM

## 2019-12-19 NOTE — Progress Notes (Signed)
[address]       [place] [address]             [place] REPORT  Referring Provider is Dorothy Spark, MD PCP is Marton Redwood, MD  Chief Complaint  Patient presents with  . Mitral Valve Prolapse    Further discuss surgery TEE 12/12/19    HPI:  Patient is a 74 year old female with history of mitral valve prolapse and moderate to severe asymptomatic primary mitral regurgitation.  She was originally seen in consultation on August 31, 2018 and I saw her more recently on March 07, 2019.  At that time she continued to do well and reports no significant symptoms.  She had originally presented with history of palpitations and event monitors revealed frequent PVCs and PACs.  Symptoms improved with oral flecainide therapy.  She underwent stress echocardiography in May 2020 and did well with no significant symptoms of exertional shortness of breath and normal left ventricular systolic function.  Continued medical therapy with close observation was recommended.  I spoke with the patient over the telephone on September 26, 2019 for routine follow-up.  She previously underwent transthoracic echocardiogram June 27, 2019 which revealed stable findings with normal left ventricular size and systolic function and bileaflet prolapse with moderate to severe mitral regurgitation.  However, the patient does report that over the past several months she has started to notice a tendency to get mildly short of breath with more strenuous exertion, such as walking up a hill.    Since then she was seen in follow-up by Dr. [name] on November 23, 2019 and she subsequently underwent transesophageal echocardiogram on December 12, 2019 TEE revealed bileaflet prolapse with moderate to severe mitral regurgitation.  Left ventricular systolic function remain normal with ejection fraction estimated 55 to 60%.  There was mild left atrial enlargement.  Right  ventricular size and function was normal.  Patient returns her office today with her husband to further discuss the results of her recent TEE and whether or not to consider elective mitral valve repair.  She reports no further decline in her exercise tolerance although she does admit that she tends to get short of breath when walking up a hill.  Despite this she continues to walk 4 or 5 miles every day without significant problems and she only gets short of breath if she is walking uphill.  She has not had any resting shortness of breath, PND, orthopnea, or lower extremity edema.  She has not had increasing symptoms of palpitations and she has never had any chest pain or chest tightness.  Her energy level remains good.  She has not developed any other significant medical problems although she is scheduled to undergo eye surgery within the next week for a corneal problem.   Past Medical History:  Diagnosis Date  . Arthritis   . Breast cancer ([place])    s/p lumpectomy  . Cataract   . Dense breast 09/06/2013  . Malignant neoplasm of lower-outer quadrant of right breast of female, estrogen receptor positive ([place]) 02/11/2012  . Mitral valve disorder 05/08/2009   Qualifier: Diagnosis of  By: [name], RN, [name]    . MVP (mitral valve prolapse)   . Nonrheumatic mitral valve regurgitation   . PAC (premature atrial contraction)   . Palpitations   . Paresthesias 07/29/2014  . Personal history of breast cancer 09/06/2013  . PREMATURE ATRIAL CONTRACTIONS 05/08/2009  Qualifier: Diagnosis of  By: [name], RN, [name]    . Thyroid disease   . Tremor 12/08/2013  . Varicose veins of right lower extremity with complications [id number]    Past Surgical History:  Procedure Laterality Date  . BREAST BIOPSY  2009  . BREAST LUMPECTOMY    . HEMANGIOMA EXCISION Left 1990   arm  . MELANOMA EXCISION Right 1980   thigh  . NECK SURGERY  10/2009   C5-6 fusion  . SHOULDER ARTHROSCOPY    . TEE WITHOUT CARDIOVERSION N/A  06/30/2018   Procedure: TRANSESOPHAGEAL ECHOCARDIOGRAM (TEE);  Surgeon: Sueanne Margarita, MD;  Location: [facility] ENDOSCOPY;  Service: Cardiovascular;  Laterality: N/A;  . TEE WITHOUT CARDIOVERSION N/A 12/12/2019   Procedure: TRANSESOPHAGEAL ECHOCARDIOGRAM (TEE);  Surgeon: Dorothy Spark, MD;  Location: [facility] ENDOSCOPY;  Service: Cardiovascular;  Laterality: N/A;  . TONSILLECTOMY      Family History  Problem Relation Age of Onset  . Coronary artery disease Mother   . Hypertension Mother   . Osteoporosis Mother        wrist fracture  . Osteoarthritis Father   . Neuropathy Father   . Tremor Father   . Other Father        lymphedema  . Fibromyalgia Sister   . Heart disease Maternal Grandmother   . Colon polyps Neg Hx   . Esophageal cancer Neg Hx   . Pancreatic cancer Neg Hx   . Stomach cancer Neg Hx   . Rectal cancer Neg Hx     Social History   Socioeconomic History  . Marital status: Married    Spouse name: [name]  . Number of children: 2  . Years of education: Bachelor  . Highest education level: Not on file  Occupational History  . Not on file  Tobacco Use  . Smoking status: Never Smoker  . Smokeless tobacco: Never Used  Substance and Sexual Activity  . Alcohol use: Yes    Comment: Socially  . Drug use: No  . Sexual activity: Not on file  Other Topics Concern  . Not on file  Social History Narrative   Patient is married to [name], has 2 children   Patient is right handed   Education level is Bachelor    Caffeine consumption is none         Social Determinants of Radio broadcast assistant Strain:   . Difficulty of Paying Living Expenses:   Food Insecurity:   . Worried About Charity fundraiser in the Last Year:   . Arboriculturist in the Last Year:   Transportation Needs:   . Film/video editor (Medical):   [name] Lack of Transportation (Non-Medical):   Physical Activity:   . Days of Exercise per Week:   . Minutes of Exercise per Session:   Stress:   .  Feeling of Stress :   Social Connections:   . Frequency of Communication with Friends and Family:   . Frequency of Social Gatherings with Friends and Family:   . Attends Religious Services:   . Active Member of Clubs or Organizations:   . Attends Archivist Meetings:   [name] Marital Status:   Intimate Partner Violence:   . Fear of Current or Ex-Partner:   . Emotionally Abused:   [name] Physically Abused:   . Sexually Abused:     Current Outpatient Medications  Medication Sig Dispense Refill  . acetaminophen (TYLENOL) 500 MG tablet Take 500-1,000 mg by mouth See  admin instructions. Tale 1000 mg in the morning and 500 mg in the evening    . Biotin 5000 MCG CAPS Take 5,000 mcg by mouth daily.      . Carboxymethylcellul-Glycerin (LUBRICATING EYE DROPS OP) Place 1 drop into both eyes daily as needed (dry eyes). Systain    . Cholecalciferol (VITAMIN D) 2000 units tablet Take 2,000 Units by mouth daily.    . Cyanocobalamin (B-12) 5000 MCG CAPS Take 5,000 mcg by mouth 2 (two) times a week.    . diclofenac sodium (VOLTAREN) 1 % GEL APPLY 2-4 GRAMS TO LARGE JOINT AREA UP TO FOUR TIMES A DAY AS NEEDED (Patient taking differently: Apply 2 g topically daily as needed (pain). ) 2 Tube 2  . diphenhydrAMINE (BENADRYL) 25 MG tablet Take 25 mg by mouth daily as needed for sleep.    . fish oil-omega-3 fatty acids 1000 MG capsule Take 1 g by mouth 2 (two) times daily.     . flecainide (TAMBOCOR) 50 MG tablet Take 1.5 tablets (75 mg total) by mouth 2 (two) times daily. 270 tablet 3  . gabapentin (NEURONTIN) 600 MG tablet Take 600 mg by mouth 2 (two) times daily.    [name] ibuprofen (ADVIL,MOTRIN) 200 MG tablet Take 600 mg by mouth daily as needed for headache or moderate pain.    [name] levothyroxine (SYNTHROID) 50 MCG tablet Take 1 tablet (50 mcg total) by mouth daily before breakfast.    . Melatonin 3 MG TABS Take by mouth as needed.    . metoprolol tartrate (LOPRESSOR) 25 MG tablet Take 1 tablet (25 mg total) by  mouth 2 (two) times daily. 180 tablet 3  . nitrofurantoin (MACRODANTIN) 50 MG capsule Take 1 capsule (50 mg total) by mouth daily. 30 capsule 6   No current facility-administered medications for this visit.    Allergies  Allergen Reactions  . Codeine Nausea And Vomiting      Review of Systems:   General:  normal appetite, normal energy, no weight gain, no weight loss, no fever  Cardiac:  no chest pain with exertion, no chest pain at rest, = SOB with exertion, no resting SOB, no PND, no orthopnea, + palpitations, no arrhythmia, no atrial fibrillation, no LE edema, no dizzy spells, no syncope  Respiratory:  no shortness of breath, no home oxygen, no productive cough, no dry cough, no bronchitis, no wheezing, no hemoptysis, no asthma, no pain with inspiration or cough, no sleep apnea, no CPAP at night  GI:   no difficulty swallowing, no reflux, no frequent heartburn, no hiatal hernia, no abdominal pain, no constipation, no diarrhea, no hematochezia, no hematemesis, no melena  GU:   no dysuria,  no frequency, no urinary tract infection, no hematuria, no kidney stones, no kidney disease  Vascular:  no pain suggestive of claudication, no pain in feet, no leg cramps, no varicose veins, no DVT, no non-healing foot ulcer  Neuro:   no stroke, no TIA's, no seizures, no headaches, no temporary blindness one eye,  no slurred speech, + peripheral neuropathy, no chronic pain, no instability of gait, no memory/cognitive dysfunction  Musculoskeletal: + arthritis, no joint swelling, no myalgias, no difficulty walking, normal mobility   Skin:   no rash, no itching, no skin infections, no pressure sores or ulcerations  Psych:   no anxiety, no depression, no nervousness, no unusual recent stress  Eyes:   + blurry vision, + floaters, + recent vision changes, + wears glasses or contacts  ENT:  no hearing loss, no loose or painful teeth, no dentures, last saw dentist May 2020  Hematologic:  no easy bruising, no  abnormal bleeding, no clotting disorder, no frequent epistaxis  Endocrine:  no diabetes, does not check CBG's at home     Physical Exam:   BP 120/77   Pulse 61   Temp 97.6 F (36.4 C) (Skin)   Resp 20   Ht 5\' 7"  (1.702 m)   Wt 137 lb (62.1 kg)   SpO2 96% Comment: RA  BMI 21.46 kg/m   General:  Thin,  well-appearing  HEENT:  Unremarkable   Neck:   no JVD, no bruits, no adenopathy   Chest:   clear to auscultation, symmetrical breath sounds, no wheezes, no rhonchi   CV:   RRR, grade II/VI holosystolic murmur   Abdomen:  soft, non-tender, no masses   Extremities:  warm, well-perfused, pulses diminished but palpable, no LE edema  Rectal/GU  Deferred  Neuro:   Grossly non-focal and symmetrical throughout  Skin:   Clean and dry, no rashes, no breakdown   Diagnostic Tests:   TRANSESOPHOGEAL ECHO REPORT       Patient Name:  INASIA BRAULT Date of Exam: 12/12/2019  Medical Rec #: [id number]     Height:    67.0 in  Accession #:  [id number]     Weight:    137.0 lb  Date of Birth: 12-10-1945     BSA:     1.722 m  Patient Age:  42 years      BP:      125/74 mmHg  Patient Gender: F         HR:      64 bpm.  Exam Location: Inpatient   Procedure: Transesophageal Echo, 3D Echo, Color Doppler and Cardiac  Doppler   Indications:   I34.1 Nonrheumatic mitral (valve) prolapse    History:     Patient has prior history of Echocardiogram examinations,  most          recent 06/27/2019. Mitral Valve Prolapse.    Sonographer:   Raquel Sarna Senior RDCS  Referring Phys: [id number] [place]  Diagnosing Phys: Ena Dawley MD   PROCEDURE: After discussion of the risks and benefits of a TEE, an  informed consent was obtained from the patient. The transesophogeal probe  was passed without difficulty through the esophogus of the patient.  Sedation performed by different physician.  The patient was monitored  while under deep sedation. Anesthestetic  sedation was provided intravenously by Anesthesiology: 192mg  of Propofol.  The patient's vital signs; including heart rate, blood pressure, and  oxygen saturation; remained stable  throughout the procedure. The patient developed no complications during  the procedure.   IMPRESSIONS    1. 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 The jet hits the  posterior wall, however there is no reversal of flow in the pulmonary  vains.  2. Left ventricular ejection fraction, by estimation, is 55 to 60%. The  left ventricle has normal function. The left ventricle has no regional  wall motion abnormalities. Left ventricular diastolic function could not  be evaluated.  3. Right ventricular systolic function is normal. The right ventricular  size is normal. There is normal pulmonary artery systolic pressure.  4. Left atrial size was mildly dilated. No left atrial/left atrial  appendage thrombus was detected.  5. The mitral valve is myxomatous. Moderate to severe  mitral valve  regurgitation. No evidence of mitral stenosis.  6. The aortic valve is normal in structure. Aortic valve regurgitation is  not visualized. No aortic stenosis is present.  7. The inferior vena cava is normal in size with greater than 50%  respiratory variability, suggesting right atrial pressure of 3 mmHg.   Conclusion(s)/Recommendation(s): Normal biventricular function without  evidence of hemodynamically significant valvular heart disease.   FINDINGS  Left Ventricle: Left ventricular ejection fraction, by estimation, is 55  to 60%. The left ventricle has normal function. The left ventricle has no  regional wall motion abnormalities. The left ventricular internal cavity  size was normal in size. There is  no left ventricular hypertrophy. Left ventricular diastolic function  could not be  evaluated.   Right Ventricle: The right ventricular size is normal. No increase in  right ventricular wall thickness. Right ventricular systolic function is  normal. There is normal pulmonary artery systolic pressure.   Left Atrium: Left atrial size was mildly dilated. No left atrial/left  atrial appendage thrombus was detected.   Right Atrium: Right atrial size was normal in size.   Pericardium: There is no evidence of pericardial effusion.   Mitral Valve: The mitral valve is myxomatous. There is mild prolapse of  both leaflets of the mitral valve. Normal mobility of the mitral valve  leaflets. Moderate to severe mitral valve regurgitation. No evidence of  mitral valve stenosis.   Tricuspid Valve: The tricuspid valve is normal in structure. Tricuspid  valve regurgitation is mild . No evidence of tricuspid stenosis.   Aortic Valve: The aortic valve is normal in structure. Aortic valve  regurgitation is not visualized. No aortic stenosis is present.   Pulmonic Valve: The pulmonic valve was normal in structure. Pulmonic valve  regurgitation is not visualized. No evidence of pulmonic stenosis.   Aorta: The aortic root is normal in size and structure. There is minimal  (Grade I) plaque.   Venous: The inferior vena cava is normal in size with greater than 50%  respiratory variability, suggesting right atrial pressure of 3 mmHg.   IAS/Shunts: No atrial level shunt detected by color flow Doppler.     MR Peak grad:  83.2 mmHg  MR Mean grad:  59.0 mmHg  MR Vmax:     456.00 cm/s  MR Vmean:    366.0 cm/s  MR PISA:     2.26 cm  MR PISA Eff ROA: 19 mm  MR PISA Radius: 0.60 cm   Ena Dawley MD  Electronically signed by Ena Dawley MD  Signature Date/Time: 12/12/2019/11:17:27 AM      Impression:  Patient has mitral valve prolapse with early stage D severe symptomatic primary mitral regurgitation.  Although she remains fairly active physically and  without significant limitation, over the last several months she has noticed a tendency to get short of breath walking up a hill, consistent with chronic diastolic congestive heart failure, New York Heart Association functional class I-II.  I have personally reviewed the patient's recent transesophageal echocardiogram which demonstrates presence of bileaflet prolapse with moderate to severe or severe mitral regurgitation.  There are no ruptured primary chordae tendinae but severe chordal elongation and multiple jets of regurgitation which are directed centrally across the left atrium.  There is mild left atrial enlargement.  There was not any flow reversal in the pulmonary veins.  Left ventricular systolic function remains normal.  Options include continued close observation with follow-up imaging versus elective mitral valve repair.  Based upon review  of the patient's TEE I feel there is a very high likelihood that her valve should be repairable and surgery associated with low operative risk.  The patient would likely be a good candidate for minimally invasive approach to surgery.   Plan:  The patient and her husband were counseled at length regarding the indications, risks and potential benefits of mitral valve repair.  The rationale for elective surgery has been explained, including a comparison between surgery and continued medical therapy with close follow-up.  The likelihood of successful and durable mitral valve repair has been discussed with particular reference to the findings of their recent echocardiogram.  Based upon these findings and previous experience, I have quoted them a greater than 95 percent likelihood of successful valve repair with less than 1 percent risk of mortality or major morbidity.  Alternative surgical approaches have been discussed including a comparison between conventional sternotomy and minimally-invasive techniques.  The relative risks and benefits of each have been  reviewed as they pertain to the patient's specific circumstances, and expectations for the patient's postoperative convalescence has been discussed.  The patient desires to proceed with surgery within the next month or two once she has recovered from her eye surgery.    As a next step the patient will undergo diagnostic cardiac catheterization to rule out the presence of significant coronary artery disease.  She will also undergo CT angiography to evaluate the feasibility of peripheral cannulation for surgery.  Patient will return to our office in approximately 3 weeks to review the results of these tests and possibly schedule surgery at that time.   I spent in excess of 60 minutes during the conduct of this office consultation and >50% of this time involved direct face-to-face encounter with the patient for counseling and/or coordination of their care.   Valentina Gu. Roxy Manns, MD 12/19/2019 10:40 AM

## 2019-12-19 NOTE — Patient Instructions (Signed)
Continue all previous medications without any changes at this time  

## 2019-12-20 ENCOUNTER — Telehealth: Payer: Self-pay | Admitting: *Deleted

## 2019-12-20 ENCOUNTER — Encounter: Payer: Self-pay | Admitting: *Deleted

## 2019-12-20 DIAGNOSIS — Z01812 Encounter for preprocedural laboratory examination: Secondary | ICD-10-CM

## 2019-12-20 NOTE — Telephone Encounter (Signed)
Call was transferred to me.  I addressed her question.

## 2019-12-20 NOTE — Telephone Encounter (Signed)
-----   Message from Burnell Blanks, MD sent at 12/19/2019  3:38 PM EDT ----- We will take care of it Cub.  Gavriela Cashin, Can you see if she can do a cath on Monday the 29th at 7:30 am or anytime on Wednesday the 30th with me? Thanks, Gerald Stabs ----- Message ----- From: Rexene Alberts, MD Sent: 12/19/2019   3:27 PM EDT To: Sherren Mocha, MD, Burnell Blanks, MD, #  Not urgent at all ----- Message ----- From: Burnell Blanks, MD Sent: 12/19/2019   1:39 PM EDT To: Rexene Alberts, MD, Sherren Mocha, MD, #  Ronalee Belts, I am not in the lab this week. I can do it next week if that is ok Cub?  ----- Message ----- From: Rexene Alberts, MD Sent: 12/19/2019  11:16 AM EDT To: Sherren Mocha, MD, Burnell Blanks, MD  Can one of you guys please schedule [name] for cath?  Straightforward mitral valve prolapse case

## 2019-12-20 NOTE — Telephone Encounter (Signed)
Patient calling back with questions on her appointment.

## 2019-12-20 NOTE — Telephone Encounter (Signed)
Called patient and arranged R/L heart cath with Dr. Angelena Form for 01/04/20 11:30 am Labs 3/24. Covid screen 4/5.  Will pick up instruction letter tomorrow as well as I will forward to her in [place].

## 2019-12-20 NOTE — Addendum Note (Signed)
Addended by: Lauree Chandler D on: 12/20/2019 02:32 PM   Modules accepted: Orders, SmartSet

## 2019-12-21 ENCOUNTER — Telehealth: Payer: Self-pay | Admitting: *Deleted

## 2019-12-21 ENCOUNTER — Other Ambulatory Visit: Payer: PPO | Admitting: *Deleted

## 2019-12-21 ENCOUNTER — Other Ambulatory Visit: Payer: Self-pay

## 2019-12-21 DIAGNOSIS — Z01812 Encounter for preprocedural laboratory examination: Secondary | ICD-10-CM | POA: Diagnosis not present

## 2019-12-21 LAB — CBC
Hematocrit: 41.9 % (ref 34.0–46.6)
Hemoglobin: 14 g/dL (ref 11.1–15.9)
MCH: 30.7 pg (ref 26.6–33.0)
MCHC: 33.4 g/dL (ref 31.5–35.7)
MCV: 92 fL (ref 79–97)
Platelets: 231 10*3/uL (ref 150–450)
RBC: 4.56 x10E6/uL (ref 3.77–5.28)
RDW: 13.3 % (ref 11.7–15.4)
WBC: 5.1 10*3/uL (ref 3.4–10.8)

## 2019-12-21 LAB — BASIC METABOLIC PANEL
BUN/Creatinine Ratio: 13 (ref 12–28)
BUN: 10 mg/dL (ref 8–27)
CO2: 25 mmol/L (ref 20–29)
Calcium: 9.5 mg/dL (ref 8.7–10.3)
Chloride: 100 mmol/L (ref 96–106)
Creatinine, Ser: 0.79 mg/dL (ref 0.57–1.00)
GFR calc Af Amer: 86 mL/min/{1.73_m2} (ref >59)
GFR calc non Af Amer: 75 mL/min/{1.73_m2} (ref >59)
Glucose: 74 mg/dL (ref 65–99)
Potassium: 4.1 mmol/L (ref 3.5–5.2)
Sodium: 140 mmol/L (ref 134–144)

## 2019-12-21 NOTE — Telephone Encounter (Signed)
-----   Message from Rodman Key, RN sent at 12/20/2019 11:08 AM EDT ----- Regarding: R/L [facility] 01/04/20 Dx: severe mitral valve regurgitation Scheduled Wed 01/04/20--arriving 9:30 am  Labs 12/21/19 Covid Screening 01/02/20 H&P 12/19/19  Thank you! Michalene

## 2020-01-02 ENCOUNTER — Other Ambulatory Visit (HOSPITAL_COMMUNITY)
Admission: RE | Admit: 2020-01-02 | Discharge: 2020-01-02 | Disposition: A | Discharge status: Home / Self Care | Payer: PPO | Source: Ambulatory Visit | Attending: Cardiovascular Disease | Admitting: Cardiovascular Disease

## 2020-01-02 DIAGNOSIS — Z01812 Encounter for preprocedural laboratory examination: Secondary | ICD-10-CM | POA: Insufficient documentation

## 2020-01-02 DIAGNOSIS — Z20822 Contact with and (suspected) exposure to covid-19: Secondary | ICD-10-CM | POA: Diagnosis not present

## 2020-01-02 LAB — SARS CORONAVIRUS 2 (TAT 6-24 HRS): SARS Coronavirus 2: NEGATIVE

## 2020-01-03 ENCOUNTER — Telehealth: Payer: Self-pay | Admitting: *Deleted

## 2020-01-03 NOTE — Telephone Encounter (Signed)
Pt contacted pre-catheterization scheduled at [facility] for: Wednesday January 04, 2020 11:30 AM Verified arrival time and place: [facility]) at: 9:30 AM   No solid food after midnight prior to cath, clear liquids until 5 AM day of procedure.  AM meds can be  taken pre-cath with sip of water including: ASA 81 mg   Confirmed patient has responsible adult to drive home post procedure and observe 24 hours after arriving home: yes  Currently, due to Covid-19 pandemic, only one person will be allowed with patient. Must be the same person for patient's entire stay and will be required to wear a mask. They will be asked to wait in the waiting room for the duration of the patient's stay.  Patients are required to wear a mask when they enter the hospital.      COVID-19 Pre-Screening Questions:  . In the past 7 to 10 days have you had a cough,  shortness of breath, headache, congestion, fever (100 or greater) body aches, chills, sore throat, or sudden loss of taste or sense of smell? No  . Have you been around anyone with known Covid 19 in the past 7-10 days? no . Have you been around anyone who is awaiting Covid 19 test results in the past 7 to 10 days? no . Have you been around anyone who  has mentioned symptoms of Covid 19 within the past 7 to 10 days? No  Reviewed procedure/mask/visitor instructions, COVID-19 screening questions with patient.

## 2020-01-04 ENCOUNTER — Encounter (HOSPITAL_COMMUNITY)
Admission: RE | Disposition: A | Discharge status: Home / Self Care | Payer: Self-pay | Source: Home / Self Care | Attending: Cardiovascular Disease

## 2020-01-04 ENCOUNTER — Ambulatory Visit (HOSPITAL_COMMUNITY)
Admission: RE | Admit: 2020-01-04 | Discharge: 2020-01-04 | Disposition: A | Discharge status: Home / Self Care | Payer: PPO | Attending: Cardiovascular Disease | Admitting: Cardiovascular Disease

## 2020-01-04 ENCOUNTER — Other Ambulatory Visit: Payer: Self-pay

## 2020-01-04 DIAGNOSIS — E079 Disorder of thyroid, unspecified: Secondary | ICD-10-CM | POA: Insufficient documentation

## 2020-01-04 DIAGNOSIS — Z79899 Other long term (current) drug therapy: Secondary | ICD-10-CM | POA: Insufficient documentation

## 2020-01-04 DIAGNOSIS — Z79890 Hormone replacement therapy (postmenopausal): Secondary | ICD-10-CM | POA: Diagnosis not present

## 2020-01-04 DIAGNOSIS — Z8249 Family history of ischemic heart disease and other diseases of the circulatory system: Secondary | ICD-10-CM | POA: Diagnosis not present

## 2020-01-04 DIAGNOSIS — I340 Nonrheumatic mitral (valve) insufficiency: Secondary | ICD-10-CM | POA: Diagnosis not present

## 2020-01-04 DIAGNOSIS — Z885 Allergy status to narcotic agent status: Secondary | ICD-10-CM | POA: Diagnosis not present

## 2020-01-04 HISTORY — PX: RIGHT/LEFT HEART CATH AND CORONARY ANGIOGRAPHY: CATH118266

## 2020-01-04 LAB — POCT I-STAT EG7
Acid-Base Excess: 2 mmol/L (ref 0.0–2.0)
Acid-Base Excess: 1 mmol/L (ref 0.0–2.0)
Acid-Base Excess: 1 mmol/L (ref 0.0–2.0)
Bicarbonate: 28.5 mmol/L — ABNORMAL HIGH (ref 20.0–28.0)
Bicarbonate: 27.3 mmol/L (ref 20.0–28.0)
Bicarbonate: 27.5 mmol/L (ref 20.0–28.0)
Calcium, Ion: 1.27 mmol/L (ref 1.15–1.40)
Calcium, Ion: 1.17 mmol/L (ref 1.15–1.40)
Calcium, Ion: 1.23 mmol/L (ref 1.15–1.40)
HCT: 40 % (ref 36.0–46.0)
HCT: 38 % (ref 36.0–46.0)
HCT: 39 % (ref 36.0–46.0)
Hemoglobin: 13.6 g/dL (ref 12.0–15.0)
Hemoglobin: 12.9 g/dL (ref 12.0–15.0)
Hemoglobin: 13.3 g/dL (ref 12.0–15.0)
O2 Saturation: 83 %
O2 Saturation: 83 %
O2 Saturation: 86 %
Potassium: 3.8 mmol/L (ref 3.5–5.1)
Potassium: 3.7 mmol/L (ref 3.5–5.1)
Potassium: 3.8 mmol/L (ref 3.5–5.1)
Sodium: 143 mmol/L (ref 135–145)
Sodium: 143 mmol/L (ref 135–145)
Sodium: 144 mmol/L (ref 135–145)
TCO2: 30 mmol/L (ref 22–32)
TCO2: 29 mmol/L (ref 22–32)
TCO2: 29 mmol/L (ref 22–32)
pCO2, Ven: 51.7 mmHg (ref 44.0–60.0)
pCO2, Ven: 48.9 mmHg (ref 44.0–60.0)
pCO2, Ven: 49.3 mmHg (ref 44.0–60.0)
pH, Ven: 7.349 (ref 7.250–7.430)
pH, Ven: 7.352 (ref 7.250–7.430)
pH, Ven: 7.357 (ref 7.250–7.430)
pO2, Ven: 50 mmHg — ABNORMAL HIGH (ref 32.0–45.0)
pO2, Ven: 51 mmHg — ABNORMAL HIGH (ref 32.0–45.0)
pO2, Ven: 54 mmHg — ABNORMAL HIGH (ref 32.0–45.0)

## 2020-01-04 SURGERY — RIGHT/LEFT HEART CATH AND CORONARY ANGIOGRAPHY
Anesthesia: LOCAL

## 2020-01-04 MED ORDER — SODIUM CHLORIDE 0.9 % WEIGHT BASED INFUSION: 1.0000 mL/kg/h | INTRAVENOUS | Status: DC

## 2020-01-04 MED ORDER — SODIUM CHLORIDE 0.9 % IV SOLN: 250.0000 mL | INTRAVENOUS | Status: DC | PRN

## 2020-01-04 MED ORDER — FENTANYL CITRATE (PF) 100 MCG/2ML IJ SOLN
INTRAMUSCULAR | Status: DC | PRN
  Administered 2020-01-04: 25 ug via INTRAVENOUS

## 2020-01-04 MED ORDER — SODIUM CHLORIDE 0.9 % WEIGHT BASED INFUSION
3.0000 mL/kg/h | INTRAVENOUS | Status: AC
  Administered 2020-01-04: 3 mL/kg/h via INTRAVENOUS

## 2020-01-04 MED ORDER — LABETALOL HCL 5 MG/ML IV SOLN: 10.0000 mg | INTRAVENOUS | Status: DC | PRN

## 2020-01-04 MED ORDER — HEPARIN SODIUM (PORCINE) 1000 UNIT/ML IJ SOLN
INTRAMUSCULAR | Status: AC
  Filled 2020-01-04: qty 1

## 2020-01-04 MED ORDER — VERAPAMIL HCL 2.5 MG/ML IV SOLN
INTRAVENOUS | Status: DC | PRN
  Administered 2020-01-04: 10 mL via INTRA_ARTERIAL

## 2020-01-04 MED ORDER — MIDAZOLAM HCL 2 MG/2ML IJ SOLN
INTRAMUSCULAR | Status: AC
  Filled 2020-01-04: qty 2

## 2020-01-04 MED ORDER — SODIUM CHLORIDE 0.9% FLUSH: 3.0000 mL | Freq: Two times a day (BID) | INTRAVENOUS | Status: DC

## 2020-01-04 MED ORDER — HEPARIN SODIUM (PORCINE) 1000 UNIT/ML IJ SOLN
INTRAMUSCULAR | Status: DC | PRN
  Administered 2020-01-04: 3000 [IU] via INTRAVENOUS

## 2020-01-04 MED ORDER — SODIUM CHLORIDE 0.9% FLUSH: 3.0000 mL | INTRAVENOUS | Status: DC | PRN

## 2020-01-04 MED ORDER — LIDOCAINE HCL (PF) 1 % IJ SOLN
INTRAMUSCULAR | Status: DC | PRN
  Administered 2020-01-04 (×2): 3 mL

## 2020-01-04 MED ORDER — HYDRALAZINE HCL 20 MG/ML IJ SOLN: 10.0000 mg | INTRAMUSCULAR | Status: DC | PRN

## 2020-01-04 MED ORDER — MIDAZOLAM HCL 2 MG/2ML IJ SOLN
INTRAMUSCULAR | Status: DC | PRN
  Administered 2020-01-04: 1 mg via INTRAVENOUS

## 2020-01-04 MED ORDER — ACETAMINOPHEN 325 MG PO TABS: 650.0000 mg | ORAL_TABLET | ORAL | Status: DC | PRN

## 2020-01-04 MED ORDER — ONDANSETRON HCL 4 MG/2ML IJ SOLN: 4.0000 mg | Freq: Four times a day (QID) | INTRAMUSCULAR | Status: DC | PRN

## 2020-01-04 MED ORDER — VERAPAMIL HCL 2.5 MG/ML IV SOLN
INTRAVENOUS | Status: AC
  Filled 2020-01-04: qty 2

## 2020-01-04 MED ORDER — HEPARIN (PORCINE) IN NACL 1000-0.9 UT/500ML-% IV SOLN
INTRAVENOUS | Status: DC | PRN
  Administered 2020-01-04 (×2): 500 mL

## 2020-01-04 MED ORDER — IOHEXOL 350 MG/ML SOLN
INTRAVENOUS | Status: DC | PRN
  Administered 2020-01-04: 70 mL

## 2020-01-04 MED ORDER — SODIUM CHLORIDE 0.9 % IV SOLN: INTRAVENOUS | Status: DC

## 2020-01-04 MED ORDER — FENTANYL CITRATE (PF) 100 MCG/2ML IJ SOLN
INTRAMUSCULAR | Status: AC
  Filled 2020-01-04: qty 2

## 2020-01-04 MED ORDER — LIDOCAINE HCL (PF) 1 % IJ SOLN
INTRAMUSCULAR | Status: AC
  Filled 2020-01-04: qty 30

## 2020-01-04 MED ORDER — ASPIRIN 81 MG PO CHEW: 81.0000 mg | CHEWABLE_TABLET | ORAL | Status: DC

## 2020-01-04 MED ORDER — HEPARIN (PORCINE) IN NACL 1000-0.9 UT/500ML-% IV SOLN
INTRAVENOUS | Status: AC
  Filled 2020-01-04: qty 1000

## 2020-01-04 SURGICAL SUPPLY — 11 items

## 2020-01-04 NOTE — Discharge Instructions (Signed)
Radial Site Care  This sheet gives you information about how to care for yourself after your procedure. Your health care provider may also give you more specific instructions. If you have problems or questions, contact your health care provider. What can I expect after the procedure? After the procedure, it is common to have:  Bruising and tenderness at the catheter insertion area. Follow these instructions at home: Medicines  Take over-the-counter and prescription medicines only as told by your health care provider. Insertion site care  Follow instructions from your health care provider about how to take care of your insertion site. Make sure you: ? Wash your hands with soap and water before you change your bandage (dressing). If soap and water are not available, use hand sanitizer. ? Change your dressing as told by your health care provider. ? Leave stitches (sutures), skin glue, or adhesive strips in place. These skin closures may need to stay in place for 2 weeks or longer. If adhesive strip edges start to loosen and curl up, you may trim the loose edges. Do not remove adhesive strips completely unless your health care provider tells you to do that.  Check your insertion site every day for signs of infection. Check for: ? Redness, swelling, or pain. ? Fluid or blood. ? Pus or a bad smell. ? Warmth.  Do not take baths, swim, or use a hot tub until your health care provider approves.  You may shower 24-48 hours after the procedure, or as directed by your health care provider. ? Remove the dressing and gently wash the site with plain soap and water. ? Pat the area dry with a clean towel. ? Do not rub the site. That could cause bleeding.  Do not apply powder or lotion to the site. Activity   For 24 hours after the procedure, or as directed by your health care provider: ? Do not flex or bend the affected arm. ? Do not push or pull heavy objects with the affected arm. ? Do not  drive yourself home from the hospital or clinic. You may drive 24 hours after the procedure unless your health care provider tells you not to. ? Do not operate machinery or power tools.  Do not lift anything that is heavier than 10 lb (4.5 kg), or the limit that you are told, until your health care provider says that it is safe.  Ask your health care provider when it is okay to: ? Return to work or school. ? Resume usual physical activities or sports. ? Resume sexual activity. General instructions  If the catheter site starts to bleed, raise your arm and put firm pressure on the site. If the bleeding does not stop, get help right away. This is a medical emergency.  If you went home on the same day as your procedure, a responsible adult should be with you for the first 24 hours after you arrive home.  Keep all follow-up visits as told by your health care provider. This is important. Contact a health care provider if:  You have a fever.  You have redness, swelling, or yellow drainage around your insertion site. Get help right away if:  You have unusual pain at the radial site.  The catheter insertion area swells very fast.  The insertion area is bleeding, and the bleeding does not stop when you hold steady pressure on the area.  Your arm or hand becomes pale, cool, tingly, or numb. These symptoms may represent a serious problem   that is an emergency. Do not wait to see if the symptoms will go away. Get medical help right away. Call your local emergency services (911 in the [place] Do not drive yourself to the hospital. Summary  After the procedure, it is common to have bruising and tenderness at the site.  Follow instructions from your health care provider about how to take care of your radial site wound. Check the wound every day for signs of infection.  Do not lift anything that is heavier than 10 lb (4.5 kg), or the limit that you are told, until your health care provider says  that it is safe. This information is not intended to replace advice given to you by your health care provider. Make sure you discuss any questions you have with your health care provider. Document Revised: 10/21/2017 Document Reviewed: 10/21/2017 Elsevier Patient Education  2020 [employer]  

## 2020-01-04 NOTE — Research (Signed)
CADF Informed Consent   Subject Name: Kellie Humphrey [facility]  Subject met inclusion and exclusion criteria.  The informed consent form, study requirements and expectations were reviewed with the subject and questions and concerns were addressed prior to the signing of the consent form.  The subject verbalized understanding of the trail requirements.  The subject agreed to participate in the CADF trial and signed the informed consent.  The informed consent was obtained prior to performance of any protocol-specific procedures for the subject.  A copy of the signed informed consent was given to the subject and a copy was placed in the subject's medical record.  Kellie Humphrey. 01/04/2020, 1045 am

## 2020-01-04 NOTE — Interval H&P Note (Signed)
History and Physical Interval Note:  01/04/2020 10:59 AM  Kellie Humphrey  has presented today for surgery, with the diagnosis of chest pain.  The various methods of treatment have been discussed with the patient and family. After consideration of risks, benefits and other options for treatment, the patient has consented to  Procedure(s): LEFT HEART CATH AND CORONARY ANGIOGRAPHY (N/A) as a surgical intervention.  The patient's history has been reviewed, patient examined, no change in status, stable for surgery.  I have reviewed the patient's chart and labs.  Questions were answered to the patient's satisfaction.    Cath Lab Visit (complete for each Cath Lab visit)  Clinical Evaluation Leading to the Procedure:   ACS: No.  Non-ACS:    Anginal Classification: No Symptoms  Anti-ischemic medical therapy: No Therapy  Non-Invasive Test Results: No non-invasive testing performed  Prior CABG: No previous CABG        Lauree Chandler

## 2020-01-04 NOTE — Progress Notes (Signed)
Upon discharge, patient began to have bleeding at radial site and hematoma formed. Pressure immediately held, bleeding controlled after 15-20 min. Dr. Angelena Form came to see patient and stated to hold patient for 1 hour after bleeding/swelling controlled. Husband updated on status.

## 2020-01-09 ENCOUNTER — Other Ambulatory Visit: Payer: Self-pay

## 2020-01-09 ENCOUNTER — Ambulatory Visit
Admission: RE | Admit: 2020-01-09 | Discharge: 2020-01-09 | Disposition: A | Discharge status: Home / Self Care | Payer: PPO | Source: Ambulatory Visit | Attending: Thoracic Surgery (Cardiothoracic Vascular Surgery) | Admitting: Thoracic Surgery (Cardiothoracic Vascular Surgery)

## 2020-01-09 ENCOUNTER — Ambulatory Visit: Payer: PPO | Admitting: Thoracic Surgery (Cardiothoracic Vascular Surgery)

## 2020-01-09 ENCOUNTER — Encounter: Payer: Self-pay | Admitting: Thoracic Surgery (Cardiothoracic Vascular Surgery)

## 2020-01-09 VITALS — BP 123/77 | HR 68 | Temp 97.3°F | Resp 16 | Ht 68.0 in | Wt 137.0 lb

## 2020-01-09 DIAGNOSIS — I341 Nonrheumatic mitral (valve) prolapse: Secondary | ICD-10-CM

## 2020-01-09 DIAGNOSIS — K7689 Other specified diseases of liver: Secondary | ICD-10-CM | POA: Diagnosis not present

## 2020-01-09 DIAGNOSIS — I340 Nonrheumatic mitral (valve) insufficiency: Secondary | ICD-10-CM | POA: Diagnosis not present

## 2020-01-09 DIAGNOSIS — I350 Nonrheumatic aortic (valve) stenosis: Secondary | ICD-10-CM | POA: Diagnosis not present

## 2020-01-09 MED ORDER — IOPAMIDOL (ISOVUE-370) INJECTION 76%
75.0000 mL | Freq: Once | INTRAVENOUS | Status: AC | PRN
  Administered 2020-01-09: 75 mL via INTRAVENOUS

## 2020-01-09 NOTE — Progress Notes (Signed)
[address]       [place] [address]             [phone]     CARDIOTHORACIC SURGERY OFFICE NOTE  Referring Provider is Dorothy Spark, MD PCP is Marton Redwood, MD   HPI:  Patient is a 74 year old female with mitral valve prolapse and long history of palpitations with frequent PVCs and PACs who returns to the office today for follow-up of stage D severe symptomatic primary mitral regurgitation.  She was last seen here in our office on December 19, 2019 at which time we reviewed results of her most recent transesophageal echocardiogram and made tentative plans to proceed with elective mitral valve repair.  Since then she underwent diagnostic cardiac catheterization by Dr. Angelena Form which revealed normal coronary artery anatomy and no significant coronary artery disease.  Right heart pressures were normal.  CT angiography was performed and the patient returns to our office today to discuss plans for proceeding with surgery.  She reports no new problems or complaints since her last office visit.   Current Outpatient Medications  Medication Sig Dispense Refill  . acetaminophen (TYLENOL) 500 MG tablet Take 500-1,000 mg by mouth See admin instructions. Tale 1000 mg in the morning and 500 mg in the evening    . Biotin 5000 MCG CAPS Take 5,000 mcg by mouth every evening.     . Cholecalciferol (VITAMIN D) 2000 units tablet Take 2,000 Units by mouth daily.    . Cyanocobalamin (B-12) 5000 MCG CAPS Take 5,000 mcg by mouth 2 (two) times a week. Mondays & Fridays.    . diclofenac sodium (VOLTAREN) 1 % GEL APPLY 2-4 GRAMS TO LARGE JOINT AREA UP TO FOUR TIMES A DAY AS NEEDED (Patient taking differently: Apply 2 g topically daily as needed (arthritis pain (thumb)). ) 2 Tube 2  . diphenhydrAMINE (BENADRYL) 25 MG tablet Take 25 mg by mouth at bedtime.     . fish oil-omega-3 fatty acids 1000 MG capsule Take 1 g by mouth 2 (two) times daily.     . flecainide (TAMBOCOR) 50 MG tablet  Take 1.5 tablets (75 mg total) by mouth 2 (two) times daily. 270 tablet 3  . gabapentin (NEURONTIN) 600 MG tablet Take 600 mg by mouth 2 (two) times daily.    [name] levothyroxine (SYNTHROID) 50 MCG tablet Take 1 tablet (50 mcg total) by mouth daily before breakfast.    . melatonin 5 MG TABS Take 5 mg by mouth at bedtime as needed (sleep).    . metoprolol tartrate (LOPRESSOR) 25 MG tablet Take 1 tablet (25 mg total) by mouth 2 (two) times daily. 180 tablet 3  . nitrofurantoin (MACRODANTIN) 50 MG capsule Take 1 capsule (50 mg total) by mouth daily. (Patient taking differently: Take 50 mg by mouth at bedtime. ) 30 capsule 6  . prednisoLONE acetate (PRED FORTE) 1 % ophthalmic suspension Place 1 drop into the left eye 4 (four) times daily.    . SYSTANE ULTRA 0.4-0.3 % SOLN Place 1 drop into both eyes every 6 (six) hours as needed (dry/irritated eyes. (4-6 times daily)).      No current facility-administered medications for this visit.      Physical Exam:   BP 123/77 (BP Location: Left Arm, Patient Position: Sitting, Cuff Size: Normal)   Pulse 68   Temp (!) 97.3 F (36.3 C)   Resp 16   Ht 5\' 8"  (1.727 m)   Wt 137 lb (62.1  kg)   SpO2 96% Comment: ON RA  BMI 20.83 kg/m   General:  Well-appearing  Chest:   Clear to auscultation  CV:   Regular rate and rhythm with systolic murmur  Incisions:  n/a  Abdomen:  Soft nontender  Extremities:  Warm and well-perfused, patient does have large amount of ecchymosis from hematoma related to recent right radial catheterization  Diagnostic Tests:  RIGHT/LEFT HEART CATH AND CORONARY ANGIOGRAPHY  Conclusion  1. No angiographic evidence of CAD 2. Normal filling pressures.  3. Severe mitral regurgitation by echo 4. PA sat 83% x 2, SVC sat 87%.   Continue with planning for mitral valve surgery.   Recommendations  Antiplatelet/Anticoag Continue planning for mitral valve surgery.  Indications  Severe mitral regurgitation [I34.0 (ICD-10-CM)]    Procedural Details  Technical Details Indication: 74 yo female with severe mitral regurgitation.   Procedure: The risks, benefits, complications, treatment options, and expected outcomes were discussed with the patient. The patient and/or family concurred with the proposed plan, giving informed consent. The patient was brought to the cath lab after IV hydration was given. The patient was sedated with Versed and Fentanyl. The IV catheter present in the right antecubital vein was changed for a 5 [place] sheath. Right heart catheterization performed with a balloon tipped catheter. The right wrist was prepped and draped in a sterile fashion. 1% lidocaine was used for local anesthesia. Using the modified Seldinger access technique, a 5 French sheath was placed in the right radial artery. 3 mg Verapamil was given through the sheath. 3000 units IV heparin was given. Standard diagnostic catheters were used to perform selective coronary angiography. LV pressures measured with a JR4 catheter. The sheath was removed from the right radial artery and a Terumo hemostasis band was applied at the arteriotomy site on the right wrist.    Estimated blood loss <50 mL.   During this procedure medications were administered to achieve and maintain moderate conscious sedation while the patient's heart rate, blood pressure, and oxygen saturation were continuously monitored and I was present face-to-face 100% of this time.  Medications (Filter: Administrations occurring from 01/04/20 1200 to 01/04/20 1251) fentaNYL (SUBLIMAZE) injection (mcg) Total dose:  25 mcg Date/Time  Rate/Dose/Volume Action  01/04/20 1214  25 mcg Given    midazolam (VERSED) injection (mg) Total dose:  1 mg Date/Time  Rate/Dose/Volume Action  01/04/20 1214  1 mg Given    lidocaine (PF) (XYLOCAINE) 1 % injection (mL) Total volume:  6 mL Date/Time  Rate/Dose/Volume Action  01/04/20 1223  3 mL Given  1227  3 mL Given    Radial  Cocktail/Verapamil only (mL) Total volume:  10 mL Date/Time  Rate/Dose/Volume Action  01/04/20 1229  10 mL Given    heparin injection (Units) Total dose:  3,000 Units Date/Time  Rate/Dose/Volume Action  01/04/20 1233  3,000 Units Given    Heparin (Porcine) in NaCl 1000-0.9 UT/500ML-% SOLN (mL) Total volume:  1,000 mL Date/Time  Rate/Dose/Volume Action  01/04/20 1235  500 mL Given  1235  500 mL Given    iohexol (OMNIPAQUE) 350 MG/ML injection (mL) Total volume:  70 mL Date/Time  Rate/Dose/Volume Action  01/04/20 1241  70 mL Given    Sedation Time  Sedation Time Physician-1: 24 minutes 3 seconds  Contrast  Medication Name Total Dose  iohexol (OMNIPAQUE) 350 MG/ML injection 70 mL    Radiation/Fluoro  Fluoro time: 3.3 (min) DAP: 4765 (mGycm2) Cumulative Air Kerma: 99991111 (mGy)  Complications  Complications documented before study  signed (01/04/2020 1:48 PM)   RIGHT/LEFT HEART CATH AND CORONARY ANGIOGRAPHY  None Documented by Burnell Blanks, MD 01/04/2020 12:44 PM  Date Found: 01/04/2020  Time Range: Intraprocedure      Coronary Findings  Diagnostic Dominance: Right Left Anterior Descending  Vessel is large. Vessel is angiographically normal.  Left Circumflex  Vessel is large. Vessel is angiographically normal.  Right Coronary Artery  Vessel is large. Vessel is angiographically normal.  Intervention  No interventions have been documented. Coronary Diagrams  Diagnostic Dominance: Right  Intervention  Implants   No implant documentation for this case.  Syngo Images  Show images for CARDIAC CATHETERIZATION  Images on Long Term Storage  Show images for Ciraolo, Valrie Coville to Procedure Log  Procedure Log    Hemo Data   Most Recent Value  RA A Wave 5 mmHg  RA V Wave 3 mmHg  RA Mean 1 mmHg  RV Systolic Pressure 24 mmHg  RV Diastolic Pressure -1 mmHg  RV EDP 4 mmHg  PA Systolic Pressure 25 mmHg  PA Diastolic Pressure 3 mmHg  PA  Mean 13 mmHg  PW A Wave 9 mmHg  PW V Wave 10 mmHg  PW Mean 6 mmHg  LV Systolic Pressure 99991111 mmHg  LV Diastolic Pressure 3 mmHg  LV EDP 9 mmHg  AOp Systolic Pressure 99991111 mmHg  AOp Diastolic Pressure 53 mmHg  AOp Mean Pressure 74 mmHg  LVp Systolic Pressure 98 mmHg  LVp Diastolic Pressure 3 mmHg  LVp EDP Pressure 9 mmHg    CT ANGIOGRAPHY CHEST, ABDOMEN AND PELVIS  TECHNIQUE: Non-contrast CT of the chest was initially obtained.  Multidetector CT imaging through the chest, abdomen and pelvis was performed using the standard protocol during bolus administration of intravenous contrast. Multiplanar reconstructed images and MIPs were obtained and reviewed to evaluate the vascular anatomy.  CONTRAST:  25mL ISOVUE-370 IOPAMIDOL (ISOVUE-370) INJECTION 76%  COMPARISON:  Feb 04, 2008.  FINDINGS: CTA CHEST FINDINGS  Cardiovascular: Preferential opacification of the thoracic aorta. No evidence of thoracic aortic aneurysm or dissection. Normal heart size. No pericardial effusion.  Mediastinum/Nodes: No enlarged mediastinal, hilar, or axillary lymph nodes. Thyroid gland, trachea, and esophagus demonstrate no significant findings.  Lungs/Pleura: No pneumothorax or pleural effusion is noted. Mild biapical scarring is noted.  Musculoskeletal: No chest wall abnormality. No acute or significant osseous findings.  Review of the MIP images confirms the above findings.  CTA ABDOMEN AND PELVIS FINDINGS  VASCULAR  Aorta: Normal caliber aorta without aneurysm, dissection, vasculitis or significant stenosis.  Celiac: Patent without evidence of aneurysm, dissection, vasculitis or significant stenosis.  SMA: Patent without evidence of aneurysm, dissection, vasculitis or significant stenosis.  Renals: Both renal arteries are patent without evidence of aneurysm, dissection, vasculitis, fibromuscular dysplasia or significant stenosis.  IMA: Patent without evidence of  aneurysm, dissection, vasculitis or significant stenosis.  Inflow: Patent without evidence of aneurysm, dissection, vasculitis or significant stenosis.  Veins: No obvious venous abnormality within the limitations of this arterial phase study.  Review of the MIP images confirms the above findings.  NON-VASCULAR  Hepatobiliary: No gallstones or biliary dilatation is noted. Stable hepatic cysts are noted.  Pancreas: Unremarkable. No pancreatic ductal dilatation or surrounding inflammatory changes.  Spleen: Normal in size without focal abnormality.  Adrenals/Urinary Tract: Adrenal glands are unremarkable. Kidneys are normal, without renal calculi, focal lesion, or hydronephrosis. Bladder is unremarkable.  Stomach/Bowel: Stomach is within normal limits. Appendix appears normal. No evidence of bowel wall thickening, distention,  or inflammatory changes.  Lymphatic: No significant adenopathy is noted.  Reproductive: Uterus and left adnexal regions are unremarkable. 5 cm right ovarian cystic abnormality is noted.  Other: No abdominal wall hernia or abnormality. No abdominopelvic ascites.  Musculoskeletal: No acute or significant osseous findings.  Review of the MIP images confirms the above findings.  IMPRESSION: 1. No evidence of thoracic or abdominal aortic aneurysm or dissection. 2. Stable hepatic cysts. 3. 5 cm right ovarian cystic abnormality is noted. Pelvic ultrasound is recommended for further evaluation.   Electronically Signed   By: Marijo Conception M.D.   On: 01/09/2020 13:26    Impression:  Patient has mitral valve prolapse with early stage D severe symptomatic primary mitral regurgitation.  Although she remains fairly active physically and without significant limitation, over the last several months she has noticed a tendency to get short of breath walking up a hill, consistent with chronic diastolic congestive heart failure, [place] Heart  Association functional class I-II.  I have personally reviewed the patient's recent transesophageal echocardiogram, diagnostic cardiac catheterization, and CT angiogram.  TEE demonstrates presence of bileaflet prolapse with moderate to severe or severe mitral regurgitation.  There are no ruptured primary chordae tendinae but severe chordal elongation and multiple jets of regurgitation which are directed centrally across the left atrium.  There is mild left atrial enlargement.  There was not any flow reversal in the pulmonary veins.  Left ventricular systolic function remains normal.  Catheterization is notable for the absence of significant coronary artery disease and normal right heart pressures.  CT angiography reveal no contraindications to peripheral cannulation for surgery.    Plan:  The patient and her husband were counseled at length regarding the indications, risks and potential benefits of mitral valve repair.  The rationale for elective surgery has been explained, including a comparison between surgery and continued medical therapy with close follow-up.  The likelihood of successful and durable mitral valve repair has been discussed with particular reference to the findings of their recent echocardiogram.  Based upon these findings and previous experience, I have quoted them a greater than 95 percent likelihood of successful valve repair with less than 1 percent risk of mortality or major morbidity.  Alternative surgical approaches have been discussed including a comparison between conventional sternotomy and minimally-invasive techniques.  The relative risks and benefits of each have been reviewed as they pertain to the patient's specific circumstances, and expectations for the patient's postoperative convalescence has been discussed.    The patient desires to proceed with surgery in the near future.  We tentatively plan for elective minimally invasive mitral valve repair on Feb 10, 2020.  The  patient understands and accepts all potential risks of surgery including but not limited to risk of death, stroke or other neurologic complication, myocardial infarction, congestive heart failure, respiratory failure, renal failure, bleeding requiring transfusion and/or reexploration, arrhythmia, infection or other wound complications, pneumonia, pleural and/or pericardial effusion, pulmonary embolus, aortic dissection or other major vascular complication, or delayed complications related to valve repair or replacement including but not limited to structural valve deterioration and failure, thrombosis, embolization, endocarditis, or paravalvular leak.  Specific risks potentially related to the minimally-invasive approach were discussed at length, including but not limited to risk of conversion to full or partial sternotomy, aortic dissection or other major vascular complication, unilateral acute lung injury or pulmonary edema, phrenic nerve dysfunction or paralysis, rib fracture, chronic pain, lung hernia, or lymphocele. All of their questions have been answered.  I spent in excess of 25 minutes during the conduct of this office consultation and >50% of this time involved direct face-to-face encounter with the patient for counseling and/or coordination of their care.    Valentina Gu. Roxy Manns, MD 01/09/2020 4:44 PM

## 2020-01-09 NOTE — Patient Instructions (Addendum)
Schedule an appointment with your gynecologist in the future to consider pelvic ultrasound at some point to evaluate right ovarian cyst  Stop taking fish oil capsules  Continue taking other medications without change through the day before surgery.  Make sure to bring all of your medications with you when you come for your Pre-Admission Testing appointment at [facility] Short-Stay Department.  Have nothing to eat or drink after midnight the night before surgery.  On the morning of surgery take only Synthroid and fleicainide with a sip of water.

## 2020-01-10 ENCOUNTER — Other Ambulatory Visit: Payer: Self-pay | Admitting: *Deleted

## 2020-01-10 ENCOUNTER — Encounter: Payer: Self-pay | Admitting: *Deleted

## 2020-01-10 DIAGNOSIS — I340 Nonrheumatic mitral (valve) insufficiency: Secondary | ICD-10-CM

## 2020-01-19 DIAGNOSIS — D259 Leiomyoma of uterus, unspecified: Secondary | ICD-10-CM | POA: Diagnosis not present

## 2020-01-19 DIAGNOSIS — N83201 Unspecified ovarian cyst, right side: Secondary | ICD-10-CM | POA: Diagnosis not present

## 2020-01-24 DIAGNOSIS — N3010 Interstitial cystitis (chronic) without hematuria: Secondary | ICD-10-CM | POA: Diagnosis not present

## 2020-01-24 DIAGNOSIS — Z87440 Personal history of urinary (tract) infections: Secondary | ICD-10-CM | POA: Diagnosis not present

## 2020-02-06 ENCOUNTER — Ambulatory Visit: Payer: PPO | Admitting: Thoracic Surgery (Cardiothoracic Vascular Surgery)

## 2020-02-06 ENCOUNTER — Other Ambulatory Visit: Payer: Self-pay

## 2020-02-06 ENCOUNTER — Encounter: Payer: Self-pay | Admitting: Thoracic Surgery (Cardiothoracic Vascular Surgery)

## 2020-02-06 VITALS — BP 118/73 | HR 66 | Temp 97.3°F | Resp 20 | Ht 67.0 in | Wt 137.0 lb

## 2020-02-06 DIAGNOSIS — I340 Nonrheumatic mitral (valve) insufficiency: Secondary | ICD-10-CM

## 2020-02-06 NOTE — Progress Notes (Signed)
[address]       [place], [address]             [phone]     CARDIOTHORACIC SURGERY OFFICE NOTE  Primary Cardiologist is Ena Dawley, MD PCP is Marton Redwood, MD   HPI:  Patient is a 74 year old female with mitral valve prolapse and long history of palpitations with frequent PVCs and PACs who returns to the office today for follow-up of stage D severe symptomatic primary mitral regurgitation.  She was last seen here in our office on December 19, 2019 at which time we made tentative plans to proceed with elective mitral valve repair later this week.  She reports no new problems or complaints since her last office visit.   Current Outpatient Medications  Medication Sig Dispense Refill  . acetaminophen (TYLENOL) 500 MG tablet Take 500-1,000 mg by mouth See admin instructions. Take 500 mg at night, may take a second 500 -1000 mg dose as needed for pain    . Biotin 5000 MCG CAPS Take 5,000 mcg by mouth every evening.     . Cholecalciferol (VITAMIN D) 2000 units tablet Take 2,000 Units by mouth daily.    . Cyanocobalamin (B-12) 5000 MCG CAPS Take 5,000 mcg by mouth 2 (two) times a week. Mondays & Fridays.    . diclofenac sodium (VOLTAREN) 1 % GEL APPLY 2-4 GRAMS TO LARGE JOINT AREA UP TO FOUR TIMES A DAY AS NEEDED (Patient taking differently: Apply 2 g topically daily as needed (arthritis pain (thumb)). ) 2 Tube 2  . diphenhydrAMINE (BENADRYL) 25 MG tablet Take 25 mg by mouth at bedtime.     . flecainide (TAMBOCOR) 50 MG tablet Take 1.5 tablets (75 mg total) by mouth 2 (two) times daily. 270 tablet 3  . gabapentin (NEURONTIN) 600 MG tablet Take 600 mg by mouth 2 (two) times daily.    [name] levothyroxine (SYNTHROID) 50 MCG tablet Take 1 tablet (50 mcg total) by mouth daily before breakfast.    . melatonin 5 MG TABS Take 5 mg by mouth at bedtime.     . metoprolol tartrate (LOPRESSOR) 25 MG tablet Take 1 tablet (25 mg total) by mouth 2 (two) times daily. 180 tablet 3    . nitrofurantoin (MACRODANTIN) 50 MG capsule Take 1 capsule (50 mg total) by mouth daily. (Patient taking differently: Take 50 mg by mouth at bedtime. ) 30 capsule 6  . prednisoLONE acetate (PRED FORTE) 1 % ophthalmic suspension Place 1 drop into the left eye 3 (three) times daily.     . SYSTANE ULTRA 0.4-0.3 % SOLN Place 1 drop into the left eye every 4 (four) hours as needed (dry/irritated eyes. (4-6 times daily)).     . fish oil-omega-3 fatty acids 1000 MG capsule Take 1 g by mouth 2 (two) times daily.      No current facility-administered medications for this visit.      Physical Exam:   BP 118/73 (BP Location: Right Arm, Patient Position: Sitting, Cuff Size: Normal)   Pulse 66   Temp (!) 97.3 F (36.3 C) (Temporal)   Resp 20   Ht 5\' 7"  (1.702 m)   Wt 137 lb (62.1 kg)   SpO2 99% Comment: RA  BMI 21.46 kg/m   General:  Well-appearing  Chest:   Clear to auscultation with symmetrical breath sounds  CV:   Regular rate and rhythm with grade 2/6 to 3/6 systolic murmur  Incisions:  n/a  Abdomen:  Soft nontender  Extremities:  Warm and well-perfused  Diagnostic Tests:  n/a   Impression:   Patient has mitral valve prolapse with early stage D severe symptomatic primary mitral regurgitation. Although she remains fairly active physically and without significant limitation, over the last several months she has noticed a tendency to get short of breath walking up a hill, consistent with chronic diastolic congestive heart failure, New York Heart Association functional class I-II. I have personally reviewed the patient's recent transesophageal echocardiogram, diagnostic cardiac catheterization, and CT angiogram.  TEE demonstrates presence of bileaflet prolapse with moderate to severe or severe mitral regurgitation. There are no ruptured primary chordae tendinae but severe chordal elongationand multiple jets of regurgitation which are directed centrally across the left atrium. There is  mild left atrial enlargement. There was not any flow reversal in the pulmonary veins. Left ventricular systolic function remains normal.  Catheterization is notable for the absence of significant coronary artery disease and normal right heart pressures.  CT angiography reveal no contraindications to peripheral cannulation for surgery.    Plan:  The patientand her husband wereagain counseled at length regarding the indications, risks and potential benefits of mitral valve repair. The rationale for elective surgery has been explained, including a comparison between surgery and continued medical therapy with close follow-up. The likelihood of successful and durable mitral valve repair has been discussed with particular reference to the findings of their recent echocardiogram. Based upon these findings and previous experience, I have quoted them a greater than 95percent likelihood of successful valve repair with less than 1percent risk of mortality or major morbidity. Alternative surgical approaches have been discussed including a comparison between conventional sternotomy and minimally-invasive techniques. The relative risks and benefits of each have been reviewed as they pertain to the patient's specific circumstances, and expectations for the patient's postoperative convalescence have been discussed.   We plan for elective minimally invasive mitral valve repair on Feb 10, 2020.  The patient understands and accepts all potential risks of surgery including but not limited to risk of death, stroke or other neurologic complication, myocardial infarction, congestive heart failure, respiratory failure, renal failure, bleeding requiring transfusion and/or reexploration, arrhythmia, infection or other wound complications, pneumonia, pleural and/or pericardial effusion, pulmonary embolus, aortic dissection or other major vascular complication, or delayed complications related to valve repair or  replacement including but not limited to structural valve deterioration and failure, thrombosis, embolization, endocarditis, or paravalvular leak.  Specific risks potentially related to the minimally-invasive approach were discussed at length, including but not limited to risk of conversion to full or partial sternotomy, aortic dissection or other major vascular complication, unilateral acute lung injury or pulmonary edema, phrenic nerve dysfunction or paralysis, rib fracture, chronic pain, lung hernia, or lymphocele. All of their questions have been answered.   I spent in excess of 20 minutes during the conduct of this office consultation and >50% of this time involved direct face-to-face encounter with the patient for counseling and/or coordination of their care.    Valentina Gu. Roxy Manns, MD 02/06/2020 1:14 PM

## 2020-02-06 NOTE — Patient Instructions (Addendum)
Do not take fish oil capsules  Continue taking all other medications without change through the day before surgery.  Make sure to bring all of your medications with you when you come for your Pre-Admission Testing appointment at [facility] Short-Stay Department.  Have nothing to eat or drink after midnight the night before surgery.  On the morning of surgery take only Synthroid and flecainide with a sip of water.  At your appointment for Pre-Admission Testing at the [facility] Short-Stay Department you will be asked to sign permission forms for your upcoming surgery.  By definition your signature on these forms implies that you and/or your designee provide full informed consent for your planned surgical procedure(s), that alternative treatment options have been discussed, that you understand and accept any and all potential risks, and that you have some understanding of what to expect for your post-operative convalescence.  For any major cardiac surgical procedure potential operative risks include but are not limited to at least some risk of death, stroke or other neurologic complication, myocardial infarction, congestive heart failure, respiratory failure, renal failure, bleeding requiring blood transfusion and/or reexploration, irregular heart rhythm, heart block or bradycardia requiring permanent pacemaker, pneumonia, pericardial effusion, pleural effusion, wound infection, pulmonary embolus or other thromboembolic complication, chronic pain, or other complications related to the specific procedure(s) performed.  Please call to schedule a follow-up appointment in our office prior to surgery if you have any unresolved questions about your planned surgical procedure, the associated risks, alternative treatment options, and/or expectations for your post-operative recovery.

## 2020-02-07 NOTE — Progress Notes (Signed)
[facility] - [place], [place] - [address] [place] [address] Phone: [phone] Fax: [place], [place] - [address] [address] Phone: [phone] Fax: [phone]      Your procedure is scheduled on Friday, Feb 10, 2020.  Report to [facility] Main Entrance "A" at 5:30 A.M., and check in at the Admitting office.  Call this number if you have problems the morning of surgery:  [phone]  Call [phone] if you have any questions prior to your surgery date Monday-Friday 8am-4pm    Remember:  Do not eat or drink after midnight the night before your surgery    Take these medicines the morning of surgery with A SIP OF WATER:  flecainide (TAMBOCOR) gabapentin (NEURONTIN)  levothyroxine (SYNTHROID) metoprolol tartrate (LOPRESSOR) prednisoLONE acetate (PRED FORTE) 1 %  acetaminophen (TYLENOL) - if needed SYSTANE ULTRA 0.4-0.3 % SOLN - if needed  As of today, STOP taking any Aspirin (unless otherwise instructed by your surgeon) and Aspirin containing products, Aleve, Naproxen, Ibuprofen, Motrin, Advil, Goody's, BC's, all herbal medications, fish oil, and all vitamins.                      Do not wear jewelry, make up, or nail polish            Do not wear lotions, powders, perfumes, or deodorant.            Do not shave 48 hours prior to surgery.            Do not bring valuables to the hospital.            [facility] is not responsible for any belongings or valuables.  Do NOT Smoke (Tobacco/Vapping) or drink Alcohol 24 hours prior to your procedure If you use a CPAP at night, you may bring all equipment for your overnight stay.   Contacts, glasses, dentures or bridgework may not be worn into surgery.      For patients admitted to the hospital, discharge time will be determined by your treatment team.    Patients discharged the day of surgery will not be allowed to drive home, and someone needs to stay with them for 24 hours.    Special instructions:   [place]- Preparing For Surgery  Before surgery, you can play an important role. Because skin is not sterile, your skin needs to be as free of germs as possible. You can reduce the number of germs on your skin by washing with CHG (chlorahexidine gluconate) Soap before surgery.  CHG is an antiseptic cleaner which kills germs and bonds with the skin to continue killing germs even after washing.    Oral Hygiene is also important to reduce your risk of infection.  Remember - BRUSH YOUR TEETH THE MORNING OF SURGERY WITH YOUR REGULAR TOOTHPASTE  Please do not use if you have an allergy to CHG or antibacterial soaps. If your skin becomes reddened/irritated stop using the CHG.  Do not shave (including legs and underarms) for at least 48 hours prior to first CHG shower. It is OK to shave your face.  Please follow these instructions carefully.   1. Shower the NIGHT BEFORE SURGERY and the MORNING OF SURGERY with CHG Soap.   2. If you chose to wash your hair, wash your hair first as usual  with your normal shampoo.  3. After you shampoo, rinse your hair and body thoroughly to remove the shampoo.  4. Use CHG as you would any other liquid soap. You can apply CHG directly to the skin and wash gently with a scrungie or a clean washcloth.   5. Apply the CHG Soap to your body ONLY FROM THE NECK DOWN.  Do not use on open wounds or open sores. Avoid contact with your eyes, ears, mouth and genitals (private parts). Wash Face and genitals (private parts)  with your normal soap.   6. Wash thoroughly, paying special attention to the area where your surgery will be performed.  7. Thoroughly rinse your body with warm water from the neck down.  8. DO NOT shower/wash with your normal soap after using and rinsing off the CHG Soap.  9. Pat yourself dry with a  CLEAN TOWEL.  10. Wear CLEAN PAJAMAS to bed the night before surgery, wear comfortable clothes the morning of surgery  11. Place CLEAN SHEETS on your bed the night of your first shower and DO NOT SLEEP WITH PETS.   Day of Surgery:   Do not apply any deodorants/lotions.  Please wear clean clothes to the hospital/surgery center.   Remember to brush your teeth WITH YOUR REGULAR TOOTHPASTE.   Please read over the following fact sheets that you were given.

## 2020-02-08 ENCOUNTER — Other Ambulatory Visit (HOSPITAL_COMMUNITY)
Admission: RE | Admit: 2020-02-08 | Discharge: 2020-02-08 | Disposition: A | Discharge status: Home / Self Care | Payer: PPO | Source: Ambulatory Visit | Attending: Thoracic Surgery (Cardiothoracic Vascular Surgery) | Admitting: Thoracic Surgery (Cardiothoracic Vascular Surgery)

## 2020-02-08 ENCOUNTER — Encounter (HOSPITAL_COMMUNITY)
Admission: RE | Admit: 2020-02-08 | Discharge: 2020-02-08 | Disposition: A | Discharge status: Home / Self Care | Payer: PPO | Source: Ambulatory Visit | Attending: Thoracic Surgery (Cardiothoracic Vascular Surgery) | Admitting: Thoracic Surgery (Cardiothoracic Vascular Surgery)

## 2020-02-08 ENCOUNTER — Other Ambulatory Visit: Payer: Self-pay

## 2020-02-08 ENCOUNTER — Encounter (HOSPITAL_COMMUNITY): Payer: Self-pay

## 2020-02-08 ENCOUNTER — Ambulatory Visit (HOSPITAL_BASED_OUTPATIENT_CLINIC_OR_DEPARTMENT_OTHER)
Admission: RE | Admit: 2020-02-08 | Discharge: 2020-02-08 | Disposition: A | Discharge status: Home / Self Care | Payer: PPO | Source: Ambulatory Visit | Attending: Thoracic Surgery (Cardiothoracic Vascular Surgery) | Admitting: Thoracic Surgery (Cardiothoracic Vascular Surgery)

## 2020-02-08 ENCOUNTER — Ambulatory Visit (HOSPITAL_COMMUNITY)
Admission: RE | Admit: 2020-02-08 | Discharge: 2020-02-08 | Disposition: A | Discharge status: Home / Self Care | Payer: PPO | Source: Ambulatory Visit | Attending: Thoracic Surgery (Cardiothoracic Vascular Surgery) | Admitting: Thoracic Surgery (Cardiothoracic Vascular Surgery)

## 2020-02-08 DIAGNOSIS — Z8249 Family history of ischemic heart disease and other diseases of the circulatory system: Secondary | ICD-10-CM | POA: Diagnosis not present

## 2020-02-08 DIAGNOSIS — Z85820 Personal history of malignant melanoma of skin: Secondary | ICD-10-CM | POA: Diagnosis not present

## 2020-02-08 DIAGNOSIS — Z791 Long term (current) use of non-steroidal anti-inflammatories (NSAID): Secondary | ICD-10-CM | POA: Diagnosis not present

## 2020-02-08 DIAGNOSIS — R079 Chest pain, unspecified: Secondary | ICD-10-CM | POA: Diagnosis not present

## 2020-02-08 DIAGNOSIS — Z79899 Other long term (current) drug therapy: Secondary | ICD-10-CM | POA: Diagnosis not present

## 2020-02-08 DIAGNOSIS — Z01818 Encounter for other preprocedural examination: Secondary | ICD-10-CM | POA: Insufficient documentation

## 2020-02-08 DIAGNOSIS — I340 Nonrheumatic mitral (valve) insufficiency: Secondary | ICD-10-CM

## 2020-02-08 DIAGNOSIS — I341 Nonrheumatic mitral (valve) prolapse: Secondary | ICD-10-CM | POA: Diagnosis not present

## 2020-02-08 DIAGNOSIS — J90 Pleural effusion, not elsewhere classified: Secondary | ICD-10-CM | POA: Diagnosis not present

## 2020-02-08 DIAGNOSIS — D62 Acute posthemorrhagic anemia: Secondary | ICD-10-CM | POA: Diagnosis not present

## 2020-02-08 DIAGNOSIS — D6959 Other secondary thrombocytopenia: Secondary | ICD-10-CM | POA: Diagnosis not present

## 2020-02-08 DIAGNOSIS — I10 Essential (primary) hypertension: Secondary | ICD-10-CM | POA: Diagnosis not present

## 2020-02-08 DIAGNOSIS — E876 Hypokalemia: Secondary | ICD-10-CM | POA: Diagnosis not present

## 2020-02-08 DIAGNOSIS — R011 Cardiac murmur, unspecified: Secondary | ICD-10-CM | POA: Diagnosis present

## 2020-02-08 DIAGNOSIS — I083 Combined rheumatic disorders of mitral, aortic and tricuspid valves: Secondary | ICD-10-CM | POA: Diagnosis not present

## 2020-02-08 DIAGNOSIS — Z8262 Family history of osteoporosis: Secondary | ICD-10-CM | POA: Diagnosis not present

## 2020-02-08 DIAGNOSIS — Z20822 Contact with and (suspected) exposure to covid-19: Secondary | ICD-10-CM | POA: Diagnosis not present

## 2020-02-08 DIAGNOSIS — I493 Ventricular premature depolarization: Secondary | ICD-10-CM | POA: Diagnosis present

## 2020-02-08 DIAGNOSIS — Z79890 Hormone replacement therapy (postmenopausal): Secondary | ICD-10-CM | POA: Diagnosis not present

## 2020-02-08 DIAGNOSIS — J939 Pneumothorax, unspecified: Secondary | ICD-10-CM | POA: Diagnosis not present

## 2020-02-08 DIAGNOSIS — Z853 Personal history of malignant neoplasm of breast: Secondary | ICD-10-CM | POA: Diagnosis not present

## 2020-02-08 DIAGNOSIS — J9811 Atelectasis: Secondary | ICD-10-CM | POA: Diagnosis not present

## 2020-02-08 DIAGNOSIS — I480 Paroxysmal atrial fibrillation: Secondary | ICD-10-CM | POA: Diagnosis not present

## 2020-02-08 DIAGNOSIS — M1990 Unspecified osteoarthritis, unspecified site: Secondary | ICD-10-CM | POA: Diagnosis not present

## 2020-02-08 DIAGNOSIS — E039 Hypothyroidism, unspecified: Secondary | ICD-10-CM | POA: Diagnosis not present

## 2020-02-08 DIAGNOSIS — Z952 Presence of prosthetic heart valve: Secondary | ICD-10-CM | POA: Diagnosis not present

## 2020-02-08 DIAGNOSIS — Z885 Allergy status to narcotic agent status: Secondary | ICD-10-CM | POA: Diagnosis not present

## 2020-02-08 DIAGNOSIS — I350 Nonrheumatic aortic (valve) stenosis: Secondary | ICD-10-CM | POA: Diagnosis not present

## 2020-02-08 DIAGNOSIS — E8770 Fluid overload, unspecified: Secondary | ICD-10-CM | POA: Diagnosis present

## 2020-02-08 DIAGNOSIS — I97191 Other postprocedural cardiac functional disturbances following other surgery: Secondary | ICD-10-CM | POA: Diagnosis not present

## 2020-02-08 HISTORY — DX: Hypothyroidism, unspecified: E03.9

## 2020-02-08 HISTORY — DX: Cardiac murmur, unspecified: R01.1

## 2020-02-08 HISTORY — DX: Cardiac arrhythmia, unspecified: I49.9

## 2020-02-08 LAB — COMPREHENSIVE METABOLIC PANEL
ALT: 17 U/L (ref 0–44)
AST: 22 U/L (ref 15–41)
Albumin: 4 g/dL (ref 3.5–5.0)
Alkaline Phosphatase: 73 U/L (ref 38–126)
Anion gap: 7 (ref 5–15)
BUN: 9 mg/dL (ref 8–23)
CO2: 24 mmol/L (ref 22–32)
Calcium: 9.4 mg/dL (ref 8.9–10.3)
Chloride: 109 mmol/L (ref 98–111)
Creatinine, Ser: 0.6 mg/dL (ref 0.44–1.00)
GFR calc Af Amer: >60 mL/min (ref >60)
GFR calc non Af Amer: >60 mL/min (ref >60)
Glucose, Bld: 95 mg/dL (ref 70–99)
Potassium: 4 mmol/L (ref 3.5–5.1)
Sodium: 140 mmol/L (ref 135–145)
Total Bilirubin: 0.9 mg/dL (ref 0.3–1.2)
Total Protein: 7.2 g/dL (ref 6.5–8.1)

## 2020-02-08 LAB — CBC
HCT: 43.5 % (ref 36.0–46.0)
Hemoglobin: 14.1 g/dL (ref 12.0–15.0)
MCH: 29.9 pg (ref 26.0–34.0)
MCHC: 32.4 g/dL (ref 30.0–36.0)
MCV: 92.2 fL (ref 80.0–100.0)
Platelets: 246 10*3/uL (ref 150–400)
RBC: 4.72 MIL/uL (ref 3.87–5.11)
RDW: 13.6 % (ref 11.5–15.5)
WBC: 4.6 10*3/uL (ref 4.0–10.5)
nRBC: 0 % (ref 0.0–0.2)

## 2020-02-08 LAB — URINALYSIS, ROUTINE W REFLEX MICROSCOPIC
Bilirubin Urine: NEGATIVE
Glucose, UA: NEGATIVE mg/dL
Hgb urine dipstick: NEGATIVE
Ketones, ur: NEGATIVE mg/dL
Leukocytes,Ua: NEGATIVE
Nitrite: NEGATIVE
Protein, ur: NEGATIVE mg/dL
Specific Gravity, Urine: 1.005 (ref 1.005–1.030)
pH: 7 (ref 5.0–8.0)

## 2020-02-08 LAB — SARS CORONAVIRUS 2 (TAT 6-24 HRS): SARS Coronavirus 2: NEGATIVE

## 2020-02-08 LAB — BLOOD GAS, ARTERIAL
Acid-Base Excess: 1.1 mmol/L (ref 0.0–2.0)
Bicarbonate: 25.4 mmol/L (ref 20.0–28.0)
Drawn by: 421801
FIO2: 21
O2 Saturation: 98.8 %
Patient temperature: 37
pCO2 arterial: 41.9 mmHg (ref 32.0–48.0)
pH, Arterial: 7.4 (ref 7.350–7.450)
pO2, Arterial: 124 mmHg — ABNORMAL HIGH (ref 83.0–108.0)

## 2020-02-08 LAB — SURGICAL PCR SCREEN
MRSA, PCR: NEGATIVE
Staphylococcus aureus: NEGATIVE

## 2020-02-08 LAB — PROTIME-INR
INR: 1 (ref 0.8–1.2)
Prothrombin Time: 12.6 seconds (ref 11.4–15.2)

## 2020-02-08 LAB — HEMOGLOBIN A1C
Hgb A1c MFr Bld: 5.6 % (ref 4.8–5.6)
Mean Plasma Glucose: 114.02 mg/dL

## 2020-02-08 LAB — TYPE AND SCREEN
ABO/RH(D): A POS
Antibody Screen: NEGATIVE

## 2020-02-08 LAB — APTT: aPTT: 30 seconds (ref 24–36)

## 2020-02-08 NOTE — Progress Notes (Signed)
PCP - Marton Redwood, MD Cardiologist - Ena Dawley, MD  PPM/ICD - Denies  Chest x-ray - 02/08/20 EKG - 02/08/20 Stress Test - 06/28/18 ECHO - 12/12/19 Cardiac Cath - 01/04/20  Sleep Study - Denies  Patient denies being a diabetic.  Blood Thinner Instructions: N/A Aspirin Instructions: N/A  ERAS Protcol - N/A PRE-SURGERY Ensure or G2- N/A  COVID TEST- 02/08/20, pending.   Anesthesia review: Yes, Cardiac hx.  Patient denies shortness of breath, fever, cough and chest pain at PAT appointment   All instructions explained to the patient, with a verbal understanding of the material. Patient agrees to go over the instructions while at home for a better understanding. Patient also instructed to self quarantine after being tested for COVID-19. The opportunity to ask questions was provided.

## 2020-02-08 NOTE — Progress Notes (Signed)
Pre-CABG (MVR) Dopplers completed. Refer to "CV Proc" under chart review to view preliminary results.  02/08/2020 10:50 AM Kelby Aline., MHA, RVT, RDCS, RDMS

## 2020-02-09 ENCOUNTER — Encounter (HOSPITAL_COMMUNITY): Payer: Self-pay

## 2020-02-09 MED ORDER — GLUTARALDEHYDE 0.625% SOAKING SOLUTION
TOPICAL | Status: DC
  Filled 2020-02-09: qty 50

## 2020-02-09 MED ORDER — MILRINONE LACTATE IN DEXTROSE 20-5 MG/100ML-% IV SOLN
0.3000 ug/kg/min | INTRAVENOUS | Status: DC
  Filled 2020-02-09: qty 100

## 2020-02-09 MED ORDER — MANNITOL 20 % IV SOLN
INTRAVENOUS | Status: DC
  Filled 2020-02-09: qty 13

## 2020-02-09 MED ORDER — VANCOMYCIN HCL 1000 MG IV SOLR
INTRAVENOUS | Status: DC
  Filled 2020-02-09: qty 1000

## 2020-02-09 MED ORDER — SODIUM CHLORIDE 0.9 % IV SOLN
INTRAVENOUS | Status: DC
  Filled 2020-02-09: qty 30

## 2020-02-09 MED ORDER — PLASMA-LYTE 148 IV SOLN
INTRAVENOUS | Status: DC
  Filled 2020-02-09: qty 2.5

## 2020-02-09 MED ORDER — SODIUM CHLORIDE 0.9 % IV SOLN
750.0000 mg | INTRAVENOUS | Status: AC
  Administered 2020-02-10: 750 mg via INTRAVENOUS
  Filled 2020-02-09: qty 750

## 2020-02-09 MED ORDER — POTASSIUM CHLORIDE 2 MEQ/ML IV SOLN
80.0000 meq | INTRAVENOUS | Status: DC
  Filled 2020-02-09: qty 40

## 2020-02-09 MED ORDER — SODIUM CHLORIDE 0.9 % IV SOLN
1.5000 g | INTRAVENOUS | Status: AC
  Administered 2020-02-10: 1.5 g via INTRAVENOUS
  Filled 2020-02-09: qty 1.5

## 2020-02-09 MED ORDER — EPINEPHRINE HCL 5 MG/250ML IV SOLN IN NS
0.0000 ug/min | INTRAVENOUS | Status: DC
  Filled 2020-02-09: qty 250

## 2020-02-09 MED ORDER — NOREPINEPHRINE 4 MG/250ML-% IV SOLN
0.0000 ug/min | INTRAVENOUS | Status: DC
  Filled 2020-02-09: qty 250

## 2020-02-09 MED ORDER — PHENYLEPHRINE HCL-NACL 20-0.9 MG/250ML-% IV SOLN
30.0000 ug/min | INTRAVENOUS | Status: AC
  Administered 2020-02-10: 20 ug/min via INTRAVENOUS
  Filled 2020-02-09: qty 250

## 2020-02-09 MED ORDER — VANCOMYCIN HCL 1250 MG/250ML IV SOLN
1250.0000 mg | INTRAVENOUS | Status: AC
  Administered 2020-02-10: 1250 mg via INTRAVENOUS
  Filled 2020-02-09: qty 250

## 2020-02-09 MED ORDER — NITROGLYCERIN IN D5W 200-5 MCG/ML-% IV SOLN
2.0000 ug/min | INTRAVENOUS | Status: DC
  Filled 2020-02-09: qty 250

## 2020-02-09 MED ORDER — DEXMEDETOMIDINE HCL IN NACL 400 MCG/100ML IV SOLN
0.1000 ug/kg/h | INTRAVENOUS | Status: AC
  Administered 2020-02-10: .2 ug/kg/h via INTRAVENOUS
  Filled 2020-02-09: qty 100

## 2020-02-09 MED ORDER — TRANEXAMIC ACID (OHS) BOLUS VIA INFUSION
15.0000 mg/kg | INTRAVENOUS | Status: AC
  Administered 2020-02-10: 922.5 mg via INTRAVENOUS
  Filled 2020-02-09: qty 923

## 2020-02-09 MED ORDER — TRANEXAMIC ACID (OHS) PUMP PRIME SOLUTION
2.0000 mg/kg | INTRAVENOUS | Status: DC
  Filled 2020-02-09: qty 1.23

## 2020-02-09 MED ORDER — TRANEXAMIC ACID 1000 MG/10ML IV SOLN
1.5000 mg/kg/h | INTRAVENOUS | Status: DC
  Filled 2020-02-09: qty 25

## 2020-02-09 MED ORDER — INSULIN REGULAR(HUMAN) IN NACL 100-0.9 UT/100ML-% IV SOLN
INTRAVENOUS | Status: AC
  Administered 2020-02-10: .4 [IU]/h via INTRAVENOUS
  Filled 2020-02-09: qty 100

## 2020-02-09 NOTE — Anesthesia Preprocedure Evaluation (Addendum)
Anesthesia Evaluation  Patient identified by MRN, date of birth, ID band Patient awake    Reviewed: Allergy & Precautions, NPO status , Patient's Chart, lab work & pertinent test results  History of Anesthesia Complications Negative for: history of anesthetic complications  Airway Mallampati: I  TM Distance: >3 FB Neck ROM: Full    Dental  (+) Dental Advisory Given   Pulmonary neg pulmonary ROS,  02/08/2020 SARS coronavirus NEG   breath sounds clear to auscultation       Cardiovascular hypertension, Pt. on medications and Pt. on home beta blockers (-) angina+ Valvular Problems/Murmurs MR  Rhythm:Regular Rate:Normal  12/2019 cath: normal coronaries, severe MR  11/2019 ECHO: EF 55-60%, severe MR with myxomatous MV, both anterior (A2,3) and posterior (P2) leaflet prolapse    Neuro/Psych negative neurological ROS     GI/Hepatic negative GI ROS, Neg liver ROS,   Endo/Other  Hypothyroidism   Renal/GU negative Renal ROS     Musculoskeletal  (+) Arthritis , Osteoarthritis,    Abdominal   Peds  Hematology negative hematology ROS (+)   Anesthesia Other Findings H/o Breast cancer  Reproductive/Obstetrics                            Anesthesia Physical Anesthesia Plan  ASA: III  Anesthesia Plan: General   Post-op Pain Management:    Induction: Intravenous  PONV Risk Score and Plan: 3 and Treatment may vary due to age or medical condition  Airway Management Planned: Oral ETT and Double Lumen EBT  Additional Equipment: Arterial line, PA Cath, 3D TEE and Ultrasound Guidance Line Placement  Intra-op Plan:   Post-operative Plan: Possible Post-op intubation/ventilation  Informed Consent: I have reviewed the patients History and Physical, chart, labs and discussed the procedure including the risks, benefits and alternatives for the proposed anesthesia with the patient or authorized representative  who has indicated his/her understanding and acceptance.     Dental advisory given  Plan Discussed with: CRNA and Surgeon  Anesthesia Plan Comments: (PAT note written 02/09/2020 by Myra Gianotti, PA-C. )      Anesthesia Quick Evaluation

## 2020-02-09 NOTE — H&P (Signed)
[address]       ,[place] [id number]             [phone]          CARDIOTHORACIC SURGERY HISTORY AND PHYSICAL EXAM  Primary Cardiologist is Ena Dawley, MD PCP is Marton Redwood, MD  Chief Complaint  Patient presents with  . Mitral Valve Prolapse    Further discuss surgery TEE 12/12/19    HPI:  Patient is a 74 year old female with history of mitral valve prolapse and moderate to severe asymptomatic primary mitral regurgitation. She was originally seen in consultation on August 31, 2018 and I saw her more recently on March 07, 2019. At that time she continued to do well and reports no significant symptoms. She had originally presented with history of palpitations and event monitors revealed frequent PVCs and PACs. Symptoms improved with oral flecainide therapy. She underwent stress echocardiography in May 2020 and did well with no significant symptoms of exertional shortness of breath and normal left ventricular systolic function. Continued medical therapy with close observation was recommended.  I spoke with the patient over the telephone on September 26, 2019 for routine follow-up. She previously underwent transthoracic echocardiogram June 27, 2019 which revealed stable findings with normal left ventricular size and systolic function and bileaflet prolapse with moderate to severe mitral regurgitation. However, the patient does report that over the past several months she has started to notice a tendency to get mildly short of breath with more strenuous exertion, such as walking up a hill.   Since then she was seen in follow-up by Dr. [name] on November 23, 2019 and she subsequently underwent transesophageal echocardiogram on December 12, 2019 TEE revealed bileaflet prolapse with moderate to severe mitral regurgitation.  Left ventricular systolic function remain normal with ejection fraction estimated 55 to 60%.  There was mild left atrial  enlargement.  Right ventricular size and function was normal.  Patient is married and lives locally in [place] with her husband.  They own a vacation property in [place] and spend a great deal of time there as well.  The patient walks at least for 5 days every week, typically for several miles.  [place].  She specifically denies any symptoms of exertional shortness of breath or fatigue.  She has not appreciated any decline in her exercise tolerance.  She states that when she was having the sensation of skipped beats they seem to get worse after long walks, but they were not associated with any shortness of breath or chest discomfort.  Currently on oral flecainide she no longer gets the sensation of skipped beats and she continues to walk on a regular basis without any limitation.  She specifically denies any history of PND, orthopnea, or lower extremity edema.  She has not had any dizzy spells or palpitations.  Patient returns with her husband to further discuss the results of her recent TEE and whether or not to consider elective mitral valve repair.  She reports no further decline in her exercise tolerance although she does admit that she tends to get short of breath when walking up a hill.  Despite this she continues to walk 4 or 5 miles every day without significant problems and she only gets short of breath if she is walking uphill. She has not had any resting shortness of breath, PND, orthopnea, or lower extremity edema. She has not had increasing symptoms of palpitations and she has  never had any chest pain or chest tightness.  Her energy level remains good.     Past Medical History:  Diagnosis Date  . Arthritis   . Breast cancer ([place])    s/p lumpectomy  . Cataract   . Dense breast 09/06/2013  . Dysrhythmia    PACs  . Heart murmur   . Hypothyroidism   . Malignant neoplasm of lower-outer quadrant of right breast of female, estrogen receptor positive ([place])  02/11/2012  . Mitral valve disorder 05/08/2009   Qualifier: Diagnosis of  By: [name], RN, [name]    . MVP (mitral valve prolapse)   . Nonrheumatic mitral valve regurgitation   . PAC (premature atrial contraction)   . Palpitations   . Paresthesias 07/29/2014  . Personal history of breast cancer 09/06/2013  . PREMATURE ATRIAL CONTRACTIONS 05/08/2009   Qualifier: Diagnosis of  By: [name], RN, [name]    . Thyroid disease   . Tremor 12/08/2013  . Varicose veins of right lower extremity with complications [id number]    Past Surgical History:  Procedure Laterality Date  . BREAST BIOPSY  2009  . BREAST LUMPECTOMY    . CARDIAC CATHETERIZATION    . DILATION AND CURETTAGE OF UTERUS    . EYE SURGERY Left    Pterygium  . HEMANGIOMA EXCISION Left 1990   arm  . MELANOMA EXCISION Right 1980   thigh  . NECK SURGERY  10/2009   C5-6 fusion  . RIGHT/LEFT HEART CATH AND CORONARY ANGIOGRAPHY N/A 01/04/2020   Procedure: RIGHT/LEFT HEART CATH AND CORONARY ANGIOGRAPHY;  Surgeon: Burnell Blanks, MD;  Location: [facility];  Service: Cardiovascular;  Laterality: N/A;  . SHOULDER ARTHROSCOPY    . TEE WITHOUT CARDIOVERSION N/A 06/30/2018   Procedure: TRANSESOPHAGEAL ECHOCARDIOGRAM (TEE);  Surgeon: Sueanne Margarita, MD;  Location: [facility] ENDOSCOPY;  Service: Cardiovascular;  Laterality: N/A;  . TEE WITHOUT CARDIOVERSION N/A 12/12/2019   Procedure: TRANSESOPHAGEAL ECHOCARDIOGRAM (TEE);  Surgeon: Dorothy Spark, MD;  Location: [facility] ENDOSCOPY;  Service: Cardiovascular;  Laterality: N/A;  . TONSILLECTOMY      Family History  Problem Relation Age of Onset  . Coronary artery disease Mother   . Hypertension Mother   . Osteoporosis Mother        wrist fracture  . Osteoarthritis Father   . Neuropathy Father   . Tremor Father   . Other Father        lymphedema  . Fibromyalgia Sister   . Heart disease Maternal Grandmother   . Colon polyps Neg Hx   . Esophageal cancer Neg Hx   . Pancreatic cancer Neg  Hx   . Stomach cancer Neg Hx   . Rectal cancer Neg Hx     Social History Social History   Tobacco Use  . Smoking status: Never Smoker  . Smokeless tobacco: Never Used  Substance Use Topics  . Alcohol use: Yes    Comment: Socially  . Drug use: No    Prior to Admission medications   Medication Sig Start Date End Date Taking? Authorizing Provider  acetaminophen (TYLENOL) 500 MG tablet Take 500-1,000 mg by mouth See admin instructions. Take 500 mg at night, may take a second 500 -1000 mg dose as needed for pain   Yes [provider]  Biotin 5000 MCG CAPS Take 5,000 mcg by mouth every evening.    Yes [provider]  Cholecalciferol (VITAMIN D) 2000 units tablet Take 2,000 Units by mouth daily.   Yes [provider]  Cyanocobalamin (B-12) 5000 MCG CAPS Take 5,000 mcg by mouth 2 (two) times a week. Mondays & Fridays.   Yes [provider]  diclofenac sodium (VOLTAREN) 1 % GEL APPLY 2-4 GRAMS TO LARGE JOINT AREA UP TO FOUR TIMES A DAY AS NEEDED Patient taking differently: Apply 2 g topically daily as needed (arthritis pain (thumb)).  04/08/18  Yes Garald Balding, MD  diphenhydrAMINE (BENADRYL) 25 MG tablet Take 25 mg by mouth at bedtime.    Yes [provider]  flecainide (TAMBOCOR) 50 MG tablet Take 1.5 tablets (75 mg total) by mouth 2 (two) times daily. 12/01/19  Yes Dorothy Spark, MD  gabapentin (NEURONTIN) 600 MG tablet Take 600 mg by mouth 2 (two) times daily.   Yes [provider]  levothyroxine (SYNTHROID) 50 MCG tablet Take 1 tablet (50 mcg total) by mouth daily before breakfast. 11/13/15  Yes Magrinat, Virgie Dad, MD  melatonin 5 MG TABS Take 5 mg by mouth at bedtime.    Yes [provider]  metoprolol tartrate (LOPRESSOR) 25 MG tablet Take 1 tablet (25 mg total) by mouth 2 (two) times daily. 03/01/19  Yes Dorothy Spark, MD  nitrofurantoin (MACRODANTIN) 50 MG capsule Take 1 capsule (50 mg total) by mouth  daily. Patient taking differently: Take 50 mg by mouth at bedtime.  12/26/13  Yes Drema Dallas, DO  prednisoLONE acetate (PRED FORTE) 1 % ophthalmic suspension Place 1 drop into the left eye 3 (three) times daily.    Yes [provider]  SYSTANE ULTRA 0.4-0.3 % SOLN Place 1 drop into the left eye every 4 (four) hours as needed (dry/irritated eyes. (4-6 times daily)).  12/19/19  Yes [provider]  fish oil-omega-3 fatty acids 1000 MG capsule Take 1 g by mouth 2 (two) times daily.     [provider]    Allergies  Allergen Reactions  . Codeine Nausea And Vomiting     Review of Systems:              General:                      normal appetite, normal energy, no weight gain, no weight loss, no fever             Cardiac:                       no chest pain with exertion, no chest pain at rest, = SOB with exertion, no resting SOB, no PND, no orthopnea, + palpitations, no arrhythmia, no atrial fibrillation, no LE edema, no dizzy spells, no syncope             Respiratory:                 no shortness of breath, no home oxygen, no productive cough, no dry cough, no bronchitis, no wheezing, no hemoptysis, no asthma, no pain with inspiration or cough, no sleep apnea, no CPAP at night             GI:                               no difficulty swallowing, no reflux, no frequent heartburn, no hiatal hernia, no abdominal pain, no constipation, no diarrhea, no hematochezia, no hematemesis, no melena             GU:  no dysuria,  no frequency, no urinary tract infection, no hematuria, no kidney stones, no kidney disease             Vascular:                     no pain suggestive of claudication, no pain in feet, no leg cramps, no varicose veins, no DVT, no non-healing foot ulcer             Neuro:                         no stroke, no TIA's, no seizures, no headaches, no temporary blindness one eye,  no slurred speech, + peripheral neuropathy, no  chronic pain, no instability of gait, no memory/cognitive dysfunction             Musculoskeletal:         + arthritis, no joint swelling, no myalgias, no difficulty walking, normal mobility              Skin:                            no rash, no itching, no skin infections, no pressure sores or ulcerations             Psych:                         no anxiety, no depression, no nervousness, no unusual recent stress             Eyes:                           + blurry vision, + floaters, + recent vision changes, + wears glasses or contacts             ENT:                            no hearing loss, no loose or painful teeth, no dentures, last saw dentist May 2020             Hematologic:               no easy bruising, no abnormal bleeding, no clotting disorder, no frequent epistaxis             Endocrine:                   no diabetes, does not check CBG's at home                           Physical Exam:              BP 120/77   Pulse 61   Temp 97.6 F (36.4 C) (Skin)   Resp 20   Ht 5\' 7"  (1.702 m)   Wt 137 lb (62.1 kg)   SpO2 96% Comment: RA  BMI 21.46 kg/m              General:                      Thin,  well-appearing             HEENT:  Unremarkable              Neck:                           no JVD, no bruits, no adenopathy              Chest:                          clear to auscultation, symmetrical breath sounds, no wheezes, no rhonchi              CV:                              RRR, grade II/VI holosystolic murmur              Abdomen:                    soft, non-tender, no masses              Extremities:                 warm, well-perfused, pulses diminished but palpable, no LE edema             Rectal/GU                   Deferred             Neuro:                         Grossly non-focal and symmetrical throughout             Skin:                            Clean and dry, no rashes, no breakdown   Diagnostic Tests:    TRANSESOPHOGEAL ECHO REPORT       Patient Name:  Kellie Humphrey Date of Exam: 12/12/2019  Medical Rec #: [id number]     Height:    67.0 in  Accession #:  [id number]     Weight:    137.0 lb  Date of Birth: 1946-07-30     BSA:     1.722 m  Patient Age:  43 years      BP:      125/74 mmHg  Patient Gender: F         HR:      64 bpm.  Exam Location: Inpatient   Procedure: Transesophageal Echo, 3D Echo, Color Doppler and Cardiac  Doppler   Indications:   I34.1 Nonrheumatic mitral (valve) prolapse    History:     Patient has prior history of Echocardiogram examinations,  most          recent 06/27/2019. Mitral Valve Prolapse.    Sonographer:   Raquel Sarna Senior RDCS  Referring Phys: [id number] [place]  Diagnosing Phys: Ena Dawley MD   PROCEDURE: After discussion of the risks and benefits of a TEE, an  informed consent was obtained from the patient. The transesophogeal probe  was passed without difficulty through the esophogus of the patient.  Sedation performed by different physician.  The patient was monitored while under deep sedation. Anesthestetic  sedation was provided intravenously by Anesthesiology: 192mg  of Propofol.  The patient's vital signs; including  heart rate, blood pressure, and  oxygen saturation; remained stable  throughout the procedure. The patient developed no complications during  the procedure.   IMPRESSIONS    1. 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 The jet hits the  posterior wall, however there is no reversal of flow in the pulmonary  vains.  2. Left ventricular ejection fraction, by estimation, is 55 to 60%. The  left ventricle has normal function. The left ventricle has no regional  wall motion abnormalities. Left ventricular diastolic function could not  be evaluated.   3. Right ventricular systolic function is normal. The right ventricular  size is normal. There is normal pulmonary artery systolic pressure.  4. Left atrial size was mildly dilated. No left atrial/left atrial  appendage thrombus was detected.  5. The mitral valve is myxomatous. Moderate to severe mitral valve  regurgitation. No evidence of mitral stenosis.  6. The aortic valve is normal in structure. Aortic valve regurgitation is  not visualized. No aortic stenosis is present.  7. The inferior vena cava is normal in size with greater than 50%  respiratory variability, suggesting right atrial pressure of 3 mmHg.   Conclusion(s)/Recommendation(s): Normal biventricular function without  evidence of hemodynamically significant valvular heart disease.   FINDINGS  Left Ventricle: Left ventricular ejection fraction, by estimation, is 55  to 60%. The left ventricle has normal function. The left ventricle has no  regional wall motion abnormalities. The left ventricular internal cavity  size was normal in size. There is  no left ventricular hypertrophy. Left ventricular diastolic function  could not be evaluated.   Right Ventricle: The right ventricular size is normal. No increase in  right ventricular wall thickness. Right ventricular systolic function is  normal. There is normal pulmonary artery systolic pressure.   Left Atrium: Left atrial size was mildly dilated. No left atrial/left  atrial appendage thrombus was detected.   Right Atrium: Right atrial size was normal in size.   Pericardium: There is no evidence of pericardial effusion.   Mitral Valve: The mitral valve is myxomatous. There is mild prolapse of  both leaflets of the mitral valve. Normal mobility of the mitral valve  leaflets. Moderate to severe mitral valve regurgitation. No evidence of  mitral valve stenosis.   Tricuspid Valve: The tricuspid valve is normal in structure. Tricuspid  valve regurgitation is mild  . No evidence of tricuspid stenosis.   Aortic Valve: The aortic valve is normal in structure. Aortic valve  regurgitation is not visualized. No aortic stenosis is present.   Pulmonic Valve: The pulmonic valve was normal in structure. Pulmonic valve  regurgitation is not visualized. No evidence of pulmonic stenosis.   Aorta: The aortic root is normal in size and structure. There is minimal  (Grade I) plaque.   Venous: The inferior vena cava is normal in size with greater than 50%  respiratory variability, suggesting right atrial pressure of 3 mmHg.   IAS/Shunts: No atrial level shunt detected by color flow Doppler.     MR Peak grad:  83.2 mmHg  MR Mean grad:  59.0 mmHg  MR Vmax:     456.00 cm/s  MR Vmean:    366.0 cm/s  MR PISA:     2.26 cm  MR PISA Eff ROA: 19 mm  MR PISA Radius: 0.60 cm   Ena Dawley MD  Electronically signed by Ena Dawley MD  Signature Date/Time: 12/12/2019/11:17:27 AM  RIGHT/LEFT HEART CATH AND CORONARY ANGIOGRAPHY  Conclusion  1. No angiographic evidence of CAD 2. Normal filling pressures.  3. Severe mitral regurgitation by echo 4. PA sat 83% x 2, SVC sat 87%.   Continue with planning for mitral valve surgery.   Recommendations  Antiplatelet/Anticoag Continue planning for mitral valve surgery.  Indications  Severe mitral regurgitation [I34.0 (ICD-10-CM)]  Procedural Details  Technical Details Indication: 74 yo female with severe mitral regurgitation.   Procedure: The risks, benefits, complications, treatment options, and expected outcomes were discussed with the patient. The patient and/or family concurred with the proposed plan, giving informed consent. The patient was brought to the cath lab after IV hydration was given. The patient was sedated with Versed and Fentanyl. The IV catheter present in the right antecubital vein was changed for a 5 [place] sheath. Right heart catheterization performed with a  balloon tipped catheter. The right wrist was prepped and draped in a sterile fashion. 1% lidocaine was used for local anesthesia. Using the modified Seldinger access technique, a 5 French sheath was placed in the right radial artery. 3 mg Verapamil was given through the sheath. 3000 units IV heparin was given. Standard diagnostic catheters were used to perform selective coronary angiography. LV pressures measured with a JR4 catheter. The sheath was removed from the right radial artery and a Terumo hemostasis band was applied at the arteriotomy site on the right wrist.    Estimated blood loss <50 mL.   During this procedure medications were administered to achieve and maintain moderate conscious sedation while the patient's heart rate, blood pressure, and oxygen saturation were continuously monitored and I was present face-to-face 100% of this time.  Medications (Filter: Administrations occurring from 01/04/20 1200 to 01/04/20 1251) fentaNYL (SUBLIMAZE) injection (mcg) Total dose:  25 mcg Date/Time  Rate/Dose/Volume Action  01/04/20 1214  25 mcg Given    midazolam (VERSED) injection (mg) Total dose:  1 mg Date/Time  Rate/Dose/Volume Action  01/04/20 1214  1 mg Given    lidocaine (PF) (XYLOCAINE) 1 % injection (mL) Total volume:  6 mL Date/Time  Rate/Dose/Volume Action  01/04/20 1223  3 mL Given  1227  3 mL Given    Radial Cocktail/Verapamil only (mL) Total volume:  10 mL Date/Time  Rate/Dose/Volume Action  01/04/20 1229  10 mL Given    heparin injection (Units) Total dose:  3,000 Units Date/Time  Rate/Dose/Volume Action  01/04/20 1233  3,000 Units Given    Heparin (Porcine) in NaCl 1000-0.9 UT/500ML-% SOLN (mL) Total volume:  1,000 mL Date/Time  Rate/Dose/Volume Action  01/04/20 1235  500 mL Given  1235  500 mL Given    iohexol (OMNIPAQUE) 350 MG/ML injection (mL) Total volume:  70 mL Date/Time  Rate/Dose/Volume Action  01/04/20 1241  70 mL Given     Sedation Time  Sedation Time Physician-1: 24 minutes 3 seconds  Contrast  Medication Name Total Dose  iohexol (OMNIPAQUE) 350 MG/ML injection 70 mL    Radiation/Fluoro  Fluoro time: 3.3 (min) DAP: 4765 (mGycm2) Cumulative Air Kerma: 99991111 (mGy)  Complications  Complications documented before study signed (01/04/2020 1:48 PM)   RIGHT/LEFT HEART CATH AND CORONARY ANGIOGRAPHY  None Documented by Burnell Blanks, MD 01/04/2020 12:44 PM  Date Found: 01/04/2020  Time Range: Intraprocedure      Coronary Findings  Diagnostic Dominance: Right Left Anterior Descending  Vessel is large. Vessel is angiographically normal.  Left Circumflex  Vessel is large. Vessel is angiographically normal.  Right Coronary Artery  Vessel  is large. Vessel is angiographically normal.  Intervention  No interventions have been documented. Coronary Diagrams  Diagnostic Dominance: Right  Intervention  Implants      No implant documentation for this case.  Syngo Images  Show images for CARDIAC CATHETERIZATION  Images on Long Term Storage  Show images for Riedinger, Latiana Ludovico to Procedure Log  Procedure Log    Hemo Data   Most Recent Value  RA A Wave 5 mmHg  RA V Wave 3 mmHg  RA Mean 1 mmHg  RV Systolic Pressure 24 mmHg  RV Diastolic Pressure -1 mmHg  RV EDP 4 mmHg  PA Systolic Pressure 25 mmHg  PA Diastolic Pressure 3 mmHg  PA Mean 13 mmHg  PW A Wave 9 mmHg  PW V Wave 10 mmHg  PW Mean 6 mmHg  LV Systolic Pressure 99991111 mmHg  LV Diastolic Pressure 3 mmHg  LV EDP 9 mmHg  AOp Systolic Pressure 99991111 mmHg  AOp Diastolic Pressure 53 mmHg  AOp Mean Pressure 74 mmHg  LVp Systolic Pressure 98 mmHg  LVp Diastolic Pressure 3 mmHg  LVp EDP Pressure 9 mmHg    CT ANGIOGRAPHY CHEST, ABDOMEN AND PELVIS  TECHNIQUE: Non-contrast CT of the chest was initially obtained.  Multidetector CT imaging through the chest, abdomen and pelvis was performed using  the standard protocol during bolus administration of intravenous contrast. Multiplanar reconstructed images and MIPs were obtained and reviewed to evaluate the vascular anatomy.  CONTRAST: 88mL ISOVUE-370 IOPAMIDOL (ISOVUE-370) INJECTION 76%  COMPARISON: Feb 04, 2008.  FINDINGS: CTA CHEST FINDINGS  Cardiovascular: Preferential opacification of the thoracic aorta. No evidence of thoracic aortic aneurysm or dissection. Normal heart size. No pericardial effusion.  Mediastinum/Nodes: No enlarged mediastinal, hilar, or axillary lymph nodes. Thyroid gland, trachea, and esophagus demonstrate no significant findings.  Lungs/Pleura: No pneumothorax or pleural effusion is noted. Mild biapical scarring is noted.  Musculoskeletal: No chest wall abnormality. No acute or significant osseous findings.  Review of the MIP images confirms the above findings.  CTA ABDOMEN AND PELVIS FINDINGS  VASCULAR  Aorta: Normal caliber aorta without aneurysm, dissection, vasculitis or significant stenosis.  Celiac: Patent without evidence of aneurysm, dissection, vasculitis or significant stenosis.  SMA: Patent without evidence of aneurysm, dissection, vasculitis or significant stenosis.  Renals: Both renal arteries are patent without evidence of aneurysm, dissection, vasculitis, fibromuscular dysplasia or significant stenosis.  IMA: Patent without evidence of aneurysm, dissection, vasculitis or significant stenosis.  Inflow: Patent without evidence of aneurysm, dissection, vasculitis or significant stenosis.  Veins: No obvious venous abnormality within the limitations of this arterial phase study.  Review of the MIP images confirms the above findings.  NON-VASCULAR  Hepatobiliary: No gallstones or biliary dilatation is noted. Stable hepatic cysts are noted.  Pancreas: Unremarkable. No pancreatic ductal dilatation or surrounding inflammatory changes.  Spleen:  Normal in size without focal abnormality.  Adrenals/Urinary Tract: Adrenal glands are unremarkable. Kidneys are normal, without renal calculi, focal lesion, or hydronephrosis. Bladder is unremarkable.  Stomach/Bowel: Stomach is within normal limits. Appendix appears normal. No evidence of bowel wall thickening, distention, or inflammatory changes.  Lymphatic: No significant adenopathy is noted.  Reproductive: Uterus and left adnexal regions are unremarkable. 5 cm right ovarian cystic abnormality is noted.  Other: No abdominal wall hernia or abnormality. No abdominopelvic ascites.  Musculoskeletal: No acute or significant osseous findings.  Review of the MIP images confirms the above findings.  IMPRESSION: 1. No evidence of thoracic or abdominal aortic aneurysm or dissection.  2. Stable hepatic cysts. 3. 5 cm right ovarian cystic abnormality is noted. Pelvic ultrasound is recommended for further evaluation.   Electronically Signed By: Marijo Conception M.D. On: 01/09/2020 13:26     Impression:   Patient has mitral valve prolapse with early stage D severe symptomatic primary mitral regurgitation. Although she remains fairly active physically and without significant limitation, over the last several months she has noticed a tendency to get short of breath walking up a hill, consistent with chronic diastolic congestive heart failure, [place] Heart Association functional class I-II. I have personally reviewed the patient's recent transesophageal echocardiogram,diagnostic cardiac catheterization, and CT angiogram. TEE demonstrates presence of bileaflet prolapse with moderate to severe or severe mitral regurgitation. There are no ruptured primary chordae tendinae but severe chordal elongationand multiple jets of regurgitation which are directed centrally across the left atrium. There is mild left atrial enlargement. There was not any flow reversal in the  pulmonary veins. Left ventricular systolic function remains normal.Catheterization is notable for the absence of significant coronary artery disease and normal right heart pressures. CT angiography reveal no contraindications to peripheral cannulation for surgery.    Plan:  The patientand her husband wereagain counseled at length regarding the indications, risks and potential benefits of mitral valve repair. The rationale for elective surgery has been explained, including a comparison between surgery and continued medical therapy with close follow-up. The likelihood of successful and durable mitral valve repair has been discussed with particular reference to the findings of their recent echocardiogram. Based upon these findings and previous experience, I have quoted them a greater than 95percent likelihood of successful valve repair with less than 1percent risk of mortality or major morbidity. Alternative surgical approaches have been discussed including a comparison between conventional sternotomy and minimally-invasive techniques. The relative risks and benefits of each have been reviewed as they pertain to the patient's specific circumstances, and expectations for the patient's postoperative convalescence have been discussed.We plan for elective minimally invasive mitral valve repair on Feb 10, 2020.  The patient understands and accepts all potential risks of surgery including but not limited to risk of death, stroke or other neurologic complication, myocardial infarction, congestive heart failure, respiratory failure, renal failure, bleeding requiring transfusion and/or reexploration, arrhythmia, infection or other wound complications, pneumonia, pleural and/or pericardial effusion, pulmonary embolus, aortic dissection or other major vascular complication, or delayed complications related to valve repair or replacement including but not limited to structural valve deterioration and  failure, thrombosis, embolization, endocarditis, or paravalvular leak. Specific risks potentially related to the minimally-invasive approach were discussed at length, including but not limited to risk of conversion to full or partial sternotomy, aortic dissection or other major vascular complication, unilateral acute lung injury or pulmonary edema, phrenic nerve dysfunction or paralysis, rib fracture, chronic pain, lung hernia, or lymphocele. All of their questions have been answered.     Valentina Gu. Roxy Manns, MD 02/06/2020 1:14 PM

## 2020-02-09 NOTE — Progress Notes (Signed)
Anesthesia Chart Review:  Case: [id number] Date/Time: 02/10/20 0715   Procedures:      MINIMALLY INVASIVE MITRAL VALVE REPAIR (MVR) (Right Chest)     TRANSESOPHAGEAL ECHOCARDIOGRAM (TEE) (N/A )   Anesthesia type: General   Pre-op diagnosis: MR   Location: [facility] OR ROOM 15 / [place] [facility]   Surgeons: Rexene Alberts, MD      DISCUSSION: Patient is a 74 year old female scheduled for the above procedure.  History includes never smoker, murmur/MVP/mitral regurgitation, palpitations (PACs, PVCs), tremor, hypothyroidism, right breast cancer (s/p right breast lumpectomy 07/25/08), varicose veins (RLE, s/p right GSV ablation 2017), skin cancer (s/p melanoma excision 1980), C5-6 ACDF (09/11/09). No evidence of CAD on 01/04/20 cath.   02/08/2020 presurgical COVID-19 test negative.  Anesthesia team to evaluate on the day of surgery.   VS: BP 112/72   Pulse (!) 56   Temp 36.6 C (Oral)   Resp 18   Wt 61.5 kg   SpO2 100%   BMI 21.24 kg/m     PROVIDERS: Marton Redwood, MD is PCP Ena Dawley, MD is cardiologist   LABS: Labs reviewed: Acceptable for surgery. (all labs ordered are listed, but only abnormal results are displayed)  Labs Reviewed  BLOOD GAS, ARTERIAL - Abnormal; Notable for the following components:      Result Value   pO2, Arterial 124 (*)    Allens test (pass/fail) BRACHIAL ARTERY (*)    All other components within normal limits  SURGICAL PCR SCREEN  APTT  CBC  COMPREHENSIVE METABOLIC PANEL  HEMOGLOBIN A1C  PROTIME-INR  URINALYSIS, ROUTINE W REFLEX MICROSCOPIC  TYPE AND SCREEN     IMAGES: CXR 02/08/20: FINDINGS: The heart size and mediastinal contours are within normal limits. Both lungs are clear. The visualized skeletal structures are unremarkable. IMPRESSION: No active cardiopulmonary disease.  CTA chest/abd/pelvis 01/09/20 (ordered by Darylene Price, MD): IMPRESSION: 1. No evidence of thoracic or abdominal aortic aneurysm or dissection. 2. Stable hepatic  cysts. 3. 5 cm right ovarian cystic abnormality is noted. Pelvic ultrasound is recommended for further evaluation.    EKG: 02/08/20: Sinus bradycardia at 57 bpm  Nonspecific T wave abnormality Abnormal ECG Confirmed by Croitoru, Mihai [phone]) on 02/08/2020 6:47:32 PM   CV: Carotid US 02/08/20: Summary:  Right Carotid: Velocities in the right ICA are consistent with a 1-39%  stenosis.  Left Carotid: Velocities in the left ICA are consistent with a 1-39%  stenosis.  Vertebrals: Bilateral vertebral arteries demonstrate antegrade flow.  Subclavians: Normal flow hemodynamics were seen in bilateral subclavian        arteries.    Cardiac cath 01/04/20: 1. No angiographic evidence of CAD 2. Normal filling pressures.  3. Severe mitral regurgitation by echo 4. PA sat 83% x 2, SVC sat 87%.  Continue with planning for mitral valve surgery.    TEE 12/12/19: IMPRESSIONS  1. 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 The jet hits the  posterior wall, however there is no reversal of flow in the pulmonary  vains.  2. Left ventricular ejection fraction, by estimation, is 55 to 60%. The  left ventricle has normal function. The left ventricle has no regional  wall motion abnormalities. Left ventricular diastolic function could not  be evaluated.  3. Right ventricular systolic function is normal. The right ventricular  size is normal. There is normal pulmonary artery systolic pressure.  4. Left atrial size was mildly dilated. No left  atrial/left atrial  appendage thrombus was detected.  5. The mitral valve is myxomatous. Moderate to severe mitral valve  regurgitation. No evidence of mitral stenosis.  6. The aortic valve is normal in structure. Aortic valve regurgitation is  not visualized. No aortic stenosis is present.  7. The inferior vena cava is normal in size with greater than 50%   respiratory variability, suggesting right atrial pressure of 3 mmHg.  - Conclusion(s)/Recommendation(s): Normal biventricular function without  evidence of hemodynamically significant valvular heart disease.    48 hour Holter Monitor 06/09/18: Study Highlights:  Sinus bradycardia to sinus tachycardia.  Infrequent PACs and frequent PVCs (1600 in 48 hrs = 1 % of all beats).  SVT runs the longest consisting of 15 beats. Sinus bradycardia to sinus tachycardia. Infrequent PACs and frequent PVCs (1600 in 48 hrs = 1 % of all beats). SVT runs the longest consisting of 15 beats. Symptoms correlating with PVCs.   Past Medical History:  Diagnosis Date  . Arthritis   . Breast cancer ([place])    s/p lumpectomy  . Cataract   . Dense breast 09/06/2013  . Dysrhythmia    PACs  . Heart murmur   . Hypothyroidism   . Malignant neoplasm of lower-outer quadrant of right breast of female, estrogen receptor positive ([place]) 02/11/2012  . Mitral valve disorder 05/08/2009   Qualifier: Diagnosis of  By: [name], RN, [name]    . MVP (mitral valve prolapse)   . Nonrheumatic mitral valve regurgitation   . PAC (premature atrial contraction)   . Palpitations   . Paresthesias 07/29/2014  . Personal history of breast cancer 09/06/2013  . PREMATURE ATRIAL CONTRACTIONS 05/08/2009   Qualifier: Diagnosis of  By: [name], RN, [name]    . Thyroid disease   . Tremor 12/08/2013  . Varicose veins of right lower extremity with complications [id number]    Past Surgical History:  Procedure Laterality Date  . BREAST BIOPSY  2009  . BREAST LUMPECTOMY    . CARDIAC CATHETERIZATION    . DILATION AND CURETTAGE OF UTERUS    . EYE SURGERY Left    Pterygium  . HEMANGIOMA EXCISION Left 1990   arm  . MELANOMA EXCISION Right 1980   thigh  . NECK SURGERY  10/2009   C5-6 fusion  . RIGHT/LEFT HEART CATH AND CORONARY ANGIOGRAPHY N/A 01/04/2020   Procedure: RIGHT/LEFT HEART CATH AND CORONARY ANGIOGRAPHY;  Surgeon: Burnell Blanks, MD;  Location: [facility];  Service: Cardiovascular;  Laterality: N/A;  . SHOULDER ARTHROSCOPY    . TEE WITHOUT CARDIOVERSION N/A 06/30/2018   Procedure: TRANSESOPHAGEAL ECHOCARDIOGRAM (TEE);  Surgeon: Sueanne Margarita, MD;  Location: [facility] ENDOSCOPY;  Service: Cardiovascular;  Laterality: N/A;  . TEE WITHOUT CARDIOVERSION N/A 12/12/2019   Procedure: TRANSESOPHAGEAL ECHOCARDIOGRAM (TEE);  Surgeon: Dorothy Spark, MD;  Location: [facility] ENDOSCOPY;  Service: Cardiovascular;  Laterality: N/A;  . TONSILLECTOMY      MEDICATIONS: . acetaminophen (TYLENOL) 500 MG tablet  . Biotin 5000 MCG CAPS  . Cholecalciferol (VITAMIN D) 2000 units tablet  . Cyanocobalamin (B-12) 5000 MCG CAPS  . diclofenac sodium (VOLTAREN) 1 % GEL  . diphenhydrAMINE (BENADRYL) 25 MG tablet  . fish oil-omega-3 fatty acids 1000 MG capsule  . flecainide (TAMBOCOR) 50 MG tablet  . gabapentin (NEURONTIN) 600 MG tablet  . levothyroxine (SYNTHROID) 50 MCG tablet  . melatonin 5 MG TABS  . metoprolol tartrate (LOPRESSOR) 25 MG tablet  . nitrofurantoin (MACRODANTIN) 50 MG capsule  . prednisoLONE acetate (PRED  FORTE) 1 % ophthalmic suspension  . SYSTANE ULTRA 0.4-0.3 % SOLN   No current facility-administered medications for this encounter.    Myra Gianotti, PA-C Surgical Short Stay/Anesthesiology [facility] Phone [phone] [facility] Phone [phone] 02/09/2020 10:22 AM

## 2020-02-10 ENCOUNTER — Inpatient Hospital Stay (HOSPITAL_COMMUNITY)
Admission: RE | Admit: 2020-02-10 | Discharge: 2020-02-16 | DRG: 220 | Disposition: A | Discharge status: Home / Self Care | Payer: PPO | Attending: Thoracic Surgery (Cardiothoracic Vascular Surgery) | Admitting: Thoracic Surgery (Cardiothoracic Vascular Surgery)

## 2020-02-10 ENCOUNTER — Inpatient Hospital Stay (HOSPITAL_COMMUNITY): Payer: PPO

## 2020-02-10 ENCOUNTER — Other Ambulatory Visit: Payer: Self-pay

## 2020-02-10 ENCOUNTER — Encounter (HOSPITAL_COMMUNITY): Payer: Self-pay | Admitting: Thoracic Surgery (Cardiothoracic Vascular Surgery)

## 2020-02-10 ENCOUNTER — Inpatient Hospital Stay (HOSPITAL_COMMUNITY): Payer: PPO | Admitting: Vascular Surgery

## 2020-02-10 ENCOUNTER — Inpatient Hospital Stay (HOSPITAL_COMMUNITY): Payer: PPO | Admitting: Certified Registered Nurse Anesthetist

## 2020-02-10 ENCOUNTER — Encounter (HOSPITAL_COMMUNITY)
Admission: RE | Disposition: A | Discharge status: Home / Self Care | Payer: Self-pay | Source: Home / Self Care | Attending: Thoracic Surgery (Cardiothoracic Vascular Surgery)

## 2020-02-10 DIAGNOSIS — Z85820 Personal history of malignant melanoma of skin: Secondary | ICD-10-CM

## 2020-02-10 DIAGNOSIS — I340 Nonrheumatic mitral (valve) insufficiency: Principal | ICD-10-CM | POA: Diagnosis present

## 2020-02-10 DIAGNOSIS — Z20822 Contact with and (suspected) exposure to covid-19: Secondary | ICD-10-CM | POA: Diagnosis present

## 2020-02-10 DIAGNOSIS — I350 Nonrheumatic aortic (valve) stenosis: Secondary | ICD-10-CM | POA: Diagnosis not present

## 2020-02-10 DIAGNOSIS — I493 Ventricular premature depolarization: Secondary | ICD-10-CM | POA: Diagnosis present

## 2020-02-10 DIAGNOSIS — Z8249 Family history of ischemic heart disease and other diseases of the circulatory system: Secondary | ICD-10-CM

## 2020-02-10 DIAGNOSIS — E8770 Fluid overload, unspecified: Secondary | ICD-10-CM | POA: Diagnosis present

## 2020-02-10 DIAGNOSIS — Z853 Personal history of malignant neoplasm of breast: Secondary | ICD-10-CM

## 2020-02-10 DIAGNOSIS — J9811 Atelectasis: Secondary | ICD-10-CM | POA: Diagnosis not present

## 2020-02-10 DIAGNOSIS — Z791 Long term (current) use of non-steroidal anti-inflammatories (NSAID): Secondary | ICD-10-CM | POA: Diagnosis not present

## 2020-02-10 DIAGNOSIS — Z9889 Other specified postprocedural states: Secondary | ICD-10-CM

## 2020-02-10 DIAGNOSIS — I341 Nonrheumatic mitral (valve) prolapse: Secondary | ICD-10-CM | POA: Diagnosis present

## 2020-02-10 DIAGNOSIS — E039 Hypothyroidism, unspecified: Secondary | ICD-10-CM | POA: Diagnosis present

## 2020-02-10 DIAGNOSIS — I97191 Other postprocedural cardiac functional disturbances following other surgery: Secondary | ICD-10-CM | POA: Diagnosis not present

## 2020-02-10 DIAGNOSIS — R011 Cardiac murmur, unspecified: Secondary | ICD-10-CM | POA: Diagnosis present

## 2020-02-10 DIAGNOSIS — D62 Acute posthemorrhagic anemia: Secondary | ICD-10-CM | POA: Diagnosis not present

## 2020-02-10 DIAGNOSIS — Z79899 Other long term (current) drug therapy: Secondary | ICD-10-CM | POA: Diagnosis not present

## 2020-02-10 DIAGNOSIS — E876 Hypokalemia: Secondary | ICD-10-CM | POA: Diagnosis present

## 2020-02-10 DIAGNOSIS — D6959 Other secondary thrombocytopenia: Secondary | ICD-10-CM | POA: Diagnosis present

## 2020-02-10 DIAGNOSIS — Z8262 Family history of osteoporosis: Secondary | ICD-10-CM | POA: Diagnosis not present

## 2020-02-10 DIAGNOSIS — I480 Paroxysmal atrial fibrillation: Secondary | ICD-10-CM | POA: Diagnosis not present

## 2020-02-10 DIAGNOSIS — Z885 Allergy status to narcotic agent status: Secondary | ICD-10-CM

## 2020-02-10 DIAGNOSIS — Z79890 Hormone replacement therapy (postmenopausal): Secondary | ICD-10-CM | POA: Diagnosis not present

## 2020-02-10 DIAGNOSIS — M1990 Unspecified osteoarthritis, unspecified site: Secondary | ICD-10-CM | POA: Diagnosis present

## 2020-02-10 DIAGNOSIS — Z09 Encounter for follow-up examination after completed treatment for conditions other than malignant neoplasm: Secondary | ICD-10-CM

## 2020-02-10 HISTORY — PX: MITRAL VALVE REPAIR: SHX2039

## 2020-02-10 HISTORY — DX: Other specified postprocedural states: Z98.890

## 2020-02-10 HISTORY — PX: TEE WITHOUT CARDIOVERSION: SHX5443

## 2020-02-10 LAB — POCT I-STAT 7, (LYTES, BLD GAS, ICA,H+H)
Acid-Base Excess: 0 mmol/L (ref 0.0–2.0)
Acid-Base Excess: 1 mmol/L (ref 0.0–2.0)
Acid-Base Excess: 1 mmol/L (ref 0.0–2.0)
Acid-Base Excess: 0 mmol/L (ref 0.0–2.0)
Acid-Base Excess: 0 mmol/L (ref 0.0–2.0)
Acid-Base Excess: 0 mmol/L (ref 0.0–2.0)
Acid-base deficit: 1 mmol/L (ref 0.0–2.0)
Bicarbonate: 27.2 mmol/L (ref 20.0–28.0)
Bicarbonate: 25.3 mmol/L (ref 20.0–28.0)
Bicarbonate: 25.6 mmol/L (ref 20.0–28.0)
Bicarbonate: 24.2 mmol/L (ref 20.0–28.0)
Bicarbonate: 25 mmol/L (ref 20.0–28.0)
Bicarbonate: 23 mmol/L (ref 20.0–28.0)
Bicarbonate: 26 mmol/L (ref 20.0–28.0)
Calcium, Ion: 1.29 mmol/L (ref 1.15–1.40)
Calcium, Ion: 1 mmol/L — ABNORMAL LOW (ref 1.15–1.40)
Calcium, Ion: 1.05 mmol/L — ABNORMAL LOW (ref 1.15–1.40)
Calcium, Ion: 1.04 mmol/L — ABNORMAL LOW (ref 1.15–1.40)
Calcium, Ion: 1.04 mmol/L — ABNORMAL LOW (ref 1.15–1.40)
Calcium, Ion: 1 mmol/L — ABNORMAL LOW (ref 1.15–1.40)
Calcium, Ion: 1.14 mmol/L — ABNORMAL LOW (ref 1.15–1.40)
HCT: 34 % — ABNORMAL LOW (ref 36.0–46.0)
HCT: 27 % — ABNORMAL LOW (ref 36.0–46.0)
HCT: 28 % — ABNORMAL LOW (ref 36.0–46.0)
HCT: 26 % — ABNORMAL LOW (ref 36.0–46.0)
HCT: 26 % — ABNORMAL LOW (ref 36.0–46.0)
HCT: 24 % — ABNORMAL LOW (ref 36.0–46.0)
HCT: 32 % — ABNORMAL LOW (ref 36.0–46.0)
Hemoglobin: 11.6 g/dL — ABNORMAL LOW (ref 12.0–15.0)
Hemoglobin: 9.2 g/dL — ABNORMAL LOW (ref 12.0–15.0)
Hemoglobin: 9.5 g/dL — ABNORMAL LOW (ref 12.0–15.0)
Hemoglobin: 8.8 g/dL — ABNORMAL LOW (ref 12.0–15.0)
Hemoglobin: 8.8 g/dL — ABNORMAL LOW (ref 12.0–15.0)
Hemoglobin: 8.2 g/dL — ABNORMAL LOW (ref 12.0–15.0)
Hemoglobin: 10.9 g/dL — ABNORMAL LOW (ref 12.0–15.0)
O2 Saturation: 100 %
O2 Saturation: 100 %
O2 Saturation: 100 %
O2 Saturation: 100 %
O2 Saturation: 100 %
O2 Saturation: 100 %
O2 Saturation: 100 %
Patient temperature: 35.8
Potassium: 3.6 mmol/L (ref 3.5–5.1)
Potassium: 3.6 mmol/L (ref 3.5–5.1)
Potassium: 4.3 mmol/L (ref 3.5–5.1)
Potassium: 3.7 mmol/L (ref 3.5–5.1)
Potassium: 3.9 mmol/L (ref 3.5–5.1)
Potassium: 3.3 mmol/L — ABNORMAL LOW (ref 3.5–5.1)
Potassium: 3.5 mmol/L (ref 3.5–5.1)
Sodium: 141 mmol/L (ref 135–145)
Sodium: 140 mmol/L (ref 135–145)
Sodium: 139 mmol/L (ref 135–145)
Sodium: 141 mmol/L (ref 135–145)
Sodium: 143 mmol/L (ref 135–145)
Sodium: 144 mmol/L (ref 135–145)
Sodium: 143 mmol/L (ref 135–145)
TCO2: 29 mmol/L (ref 22–32)
TCO2: 26 mmol/L (ref 22–32)
TCO2: 27 mmol/L (ref 22–32)
TCO2: 25 mmol/L (ref 22–32)
TCO2: 26 mmol/L (ref 22–32)
TCO2: 24 mmol/L (ref 22–32)
TCO2: 27 mmol/L (ref 22–32)
pCO2 arterial: 53.1 mmHg — ABNORMAL HIGH (ref 32.0–48.0)
pCO2 arterial: 36.3 mmHg (ref 32.0–48.0)
pCO2 arterial: 38 mmHg (ref 32.0–48.0)
pCO2 arterial: 38 mmHg (ref 32.0–48.0)
pCO2 arterial: 39.8 mmHg (ref 32.0–48.0)
pCO2 arterial: 35.9 mmHg (ref 32.0–48.0)
pCO2 arterial: 46.1 mmHg (ref 32.0–48.0)
pH, Arterial: 7.317 — ABNORMAL LOW (ref 7.350–7.450)
pH, Arterial: 7.451 — ABNORMAL HIGH (ref 7.350–7.450)
pH, Arterial: 7.437 (ref 7.350–7.450)
pH, Arterial: 7.407 (ref 7.350–7.450)
pH, Arterial: 7.413 (ref 7.350–7.450)
pH, Arterial: 7.415 (ref 7.350–7.450)
pH, Arterial: 7.353 (ref 7.350–7.450)
pO2, Arterial: 293 mmHg — ABNORMAL HIGH (ref 83.0–108.0)
pO2, Arterial: 373 mmHg — ABNORMAL HIGH (ref 83.0–108.0)
pO2, Arterial: 372 mmHg — ABNORMAL HIGH (ref 83.0–108.0)
pO2, Arterial: 369 mmHg — ABNORMAL HIGH (ref 83.0–108.0)
pO2, Arterial: 388 mmHg — ABNORMAL HIGH (ref 83.0–108.0)
pO2, Arterial: 258 mmHg — ABNORMAL HIGH (ref 83.0–108.0)
pO2, Arterial: 270 mmHg — ABNORMAL HIGH (ref 83.0–108.0)

## 2020-02-10 LAB — CBC
HCT: 33.6 % — ABNORMAL LOW (ref 36.0–46.0)
HCT: 30.4 % — ABNORMAL LOW (ref 36.0–46.0)
Hemoglobin: 11.2 g/dL — ABNORMAL LOW (ref 12.0–15.0)
Hemoglobin: 9.9 g/dL — ABNORMAL LOW (ref 12.0–15.0)
MCH: 30.6 pg (ref 26.0–34.0)
MCH: 29.9 pg (ref 26.0–34.0)
MCHC: 33.3 g/dL (ref 30.0–36.0)
MCHC: 32.6 g/dL (ref 30.0–36.0)
MCV: 91.8 fL (ref 80.0–100.0)
MCV: 91.8 fL (ref 80.0–100.0)
Platelets: 149 10*3/uL — ABNORMAL LOW (ref 150–400)
Platelets: 120 10*3/uL — ABNORMAL LOW (ref 150–400)
RBC: 3.66 MIL/uL — ABNORMAL LOW (ref 3.87–5.11)
RBC: 3.31 MIL/uL — ABNORMAL LOW (ref 3.87–5.11)
RDW: 13.6 % (ref 11.5–15.5)
RDW: 13.7 % (ref 11.5–15.5)
WBC: 15.2 10*3/uL — ABNORMAL HIGH (ref 4.0–10.5)
WBC: 11.3 10*3/uL — ABNORMAL HIGH (ref 4.0–10.5)
nRBC: 0 % (ref 0.0–0.2)
nRBC: 0 % (ref 0.0–0.2)

## 2020-02-10 LAB — POCT I-STAT, CHEM 8
BUN: 9 mg/dL (ref 8–23)
BUN: 7 mg/dL — ABNORMAL LOW (ref 8–23)
BUN: 8 mg/dL (ref 8–23)
BUN: 7 mg/dL — ABNORMAL LOW (ref 8–23)
BUN: 6 mg/dL — ABNORMAL LOW (ref 8–23)
BUN: 6 mg/dL — ABNORMAL LOW (ref 8–23)
Calcium, Ion: 1.22 mmol/L (ref 1.15–1.40)
Calcium, Ion: 1.07 mmol/L — ABNORMAL LOW (ref 1.15–1.40)
Calcium, Ion: 1.22 mmol/L (ref 1.15–1.40)
Calcium, Ion: 1.03 mmol/L — ABNORMAL LOW (ref 1.15–1.40)
Calcium, Ion: 1.03 mmol/L — ABNORMAL LOW (ref 1.15–1.40)
Calcium, Ion: 1 mmol/L — ABNORMAL LOW (ref 1.15–1.40)
Chloride: 104 mmol/L (ref 98–111)
Chloride: 104 mmol/L (ref 98–111)
Chloride: 104 mmol/L (ref 98–111)
Chloride: 105 mmol/L (ref 98–111)
Chloride: 106 mmol/L (ref 98–111)
Chloride: 107 mmol/L (ref 98–111)
Creatinine, Ser: 0.5 mg/dL (ref 0.44–1.00)
Creatinine, Ser: 0.4 mg/dL — ABNORMAL LOW (ref 0.44–1.00)
Creatinine, Ser: 0.4 mg/dL — ABNORMAL LOW (ref 0.44–1.00)
Creatinine, Ser: 0.4 mg/dL — ABNORMAL LOW (ref 0.44–1.00)
Creatinine, Ser: 0.3 mg/dL — ABNORMAL LOW (ref 0.44–1.00)
Creatinine, Ser: 0.3 mg/dL — ABNORMAL LOW (ref 0.44–1.00)
Glucose, Bld: 94 mg/dL (ref 70–99)
Glucose, Bld: 110 mg/dL — ABNORMAL HIGH (ref 70–99)
Glucose, Bld: 95 mg/dL (ref 70–99)
Glucose, Bld: 124 mg/dL — ABNORMAL HIGH (ref 70–99)
Glucose, Bld: 111 mg/dL — ABNORMAL HIGH (ref 70–99)
Glucose, Bld: 108 mg/dL — ABNORMAL HIGH (ref 70–99)
HCT: 37 % (ref 36.0–46.0)
HCT: 29 % — ABNORMAL LOW (ref 36.0–46.0)
HCT: 35 % — ABNORMAL LOW (ref 36.0–46.0)
HCT: 26 % — ABNORMAL LOW (ref 36.0–46.0)
HCT: 26 % — ABNORMAL LOW (ref 36.0–46.0)
HCT: 26 % — ABNORMAL LOW (ref 36.0–46.0)
Hemoglobin: 12.6 g/dL (ref 12.0–15.0)
Hemoglobin: 11.9 g/dL — ABNORMAL LOW (ref 12.0–15.0)
Hemoglobin: 9.9 g/dL — ABNORMAL LOW (ref 12.0–15.0)
Hemoglobin: 8.8 g/dL — ABNORMAL LOW (ref 12.0–15.0)
Hemoglobin: 8.8 g/dL — ABNORMAL LOW (ref 12.0–15.0)
Hemoglobin: 8.8 g/dL — ABNORMAL LOW (ref 12.0–15.0)
Potassium: 3.6 mmol/L (ref 3.5–5.1)
Potassium: 3.6 mmol/L (ref 3.5–5.1)
Potassium: 4.6 mmol/L (ref 3.5–5.1)
Potassium: 3.8 mmol/L (ref 3.5–5.1)
Potassium: 4 mmol/L (ref 3.5–5.1)
Potassium: 3.3 mmol/L — ABNORMAL LOW (ref 3.5–5.1)
Sodium: 141 mmol/L (ref 135–145)
Sodium: 137 mmol/L (ref 135–145)
Sodium: 140 mmol/L (ref 135–145)
Sodium: 139 mmol/L (ref 135–145)
Sodium: 143 mmol/L (ref 135–145)
Sodium: 142 mmol/L (ref 135–145)
TCO2: 26 mmol/L (ref 22–32)
TCO2: 27 mmol/L (ref 22–32)
TCO2: 28 mmol/L (ref 22–32)
TCO2: 28 mmol/L (ref 22–32)
TCO2: 27 mmol/L (ref 22–32)
TCO2: 25 mmol/L (ref 22–32)

## 2020-02-10 LAB — PLATELET COUNT: Platelets: 125 10*3/uL — ABNORMAL LOW (ref 150–400)

## 2020-02-10 LAB — BASIC METABOLIC PANEL
Anion gap: 9 (ref 5–15)
BUN: 7 mg/dL — ABNORMAL LOW (ref 8–23)
CO2: 19 mmol/L — ABNORMAL LOW (ref 22–32)
Calcium: 7.3 mg/dL — ABNORMAL LOW (ref 8.9–10.3)
Chloride: 108 mmol/L (ref 98–111)
Creatinine, Ser: 0.53 mg/dL (ref 0.44–1.00)
GFR calc Af Amer: >60 mL/min (ref >60)
GFR calc non Af Amer: >60 mL/min (ref >60)
Glucose, Bld: 135 mg/dL — ABNORMAL HIGH (ref 70–99)
Potassium: 4.2 mmol/L (ref 3.5–5.1)
Sodium: 136 mmol/L (ref 135–145)

## 2020-02-10 LAB — APTT: aPTT: 39 seconds — ABNORMAL HIGH (ref 24–36)

## 2020-02-10 LAB — MAGNESIUM: Magnesium: 3 mg/dL — ABNORMAL HIGH (ref 1.7–2.4)

## 2020-02-10 LAB — GLUCOSE, CAPILLARY
Glucose-Capillary: 102 mg/dL — ABNORMAL HIGH (ref 70–99)
Glucose-Capillary: 106 mg/dL — ABNORMAL HIGH (ref 70–99)
Glucose-Capillary: 112 mg/dL — ABNORMAL HIGH (ref 70–99)
Glucose-Capillary: 116 mg/dL — ABNORMAL HIGH (ref 70–99)
Glucose-Capillary: 123 mg/dL — ABNORMAL HIGH (ref 70–99)
Glucose-Capillary: 84 mg/dL (ref 70–99)
Glucose-Capillary: 96 mg/dL (ref 70–99)

## 2020-02-10 LAB — ECHO INTRAOPERATIVE TEE
Height: 67 in
Weight: 2160 oz

## 2020-02-10 LAB — HEMOGLOBIN AND HEMATOCRIT, BLOOD
HCT: 25.4 % — ABNORMAL LOW (ref 36.0–46.0)
Hemoglobin: 8.3 g/dL — ABNORMAL LOW (ref 12.0–15.0)

## 2020-02-10 LAB — PROTIME-INR
INR: 1.4 — ABNORMAL HIGH (ref 0.8–1.2)
Prothrombin Time: 16.5 seconds — ABNORMAL HIGH (ref 11.4–15.2)

## 2020-02-10 SURGERY — REPAIR, MITRAL VALVE, MINIMALLY INVASIVE
Anesthesia: General | Site: Chest | Laterality: Right

## 2020-02-10 MED ORDER — LACTATED RINGERS IV SOLN: INTRAVENOUS | Status: DC

## 2020-02-10 MED ORDER — FAMOTIDINE IN NACL 20-0.9 MG/50ML-% IV SOLN
20.0000 mg | Freq: Two times a day (BID) | INTRAVENOUS | Status: AC
  Administered 2020-02-10 (×2): 20 mg via INTRAVENOUS
  Filled 2020-02-10: qty 50

## 2020-02-10 MED ORDER — VANCOMYCIN HCL 1000 MG IV SOLR
INTRAVENOUS | Status: DC | PRN
  Administered 2020-02-10: 1000 mL

## 2020-02-10 MED ORDER — BUPIVACAINE HCL (PF) 0.5 % IJ SOLN
INTRAMUSCULAR | Status: AC
  Filled 2020-02-10: qty 30

## 2020-02-10 MED ORDER — NALOXONE HCL 0.4 MG/ML IJ SOLN
INTRAMUSCULAR | Status: AC
  Filled 2020-02-10: qty 1

## 2020-02-10 MED ORDER — ASPIRIN EC 325 MG PO TBEC: 325.0000 mg | DELAYED_RELEASE_TABLET | Freq: Every day | ORAL | Status: DC

## 2020-02-10 MED ORDER — ROCURONIUM BROMIDE 10 MG/ML (PF) SYRINGE
PREFILLED_SYRINGE | INTRAVENOUS | Status: DC | PRN
  Administered 2020-02-10: 20 mg via INTRAVENOUS
  Administered 2020-02-10: 30 mg via INTRAVENOUS
  Administered 2020-02-10: 40 mg via INTRAVENOUS
  Administered 2020-02-10: 50 mg via INTRAVENOUS
  Administered 2020-02-10: 60 mg via INTRAVENOUS

## 2020-02-10 MED ORDER — MIDAZOLAM HCL (PF) 10 MG/2ML IJ SOLN
INTRAMUSCULAR | Status: AC
  Filled 2020-02-10: qty 2

## 2020-02-10 MED ORDER — FENTANYL CITRATE (PF) 250 MCG/5ML IJ SOLN
INTRAMUSCULAR | Status: AC
  Filled 2020-02-10: qty 20

## 2020-02-10 MED ORDER — LEVOTHYROXINE SODIUM 50 MCG PO TABS
50.0000 ug | ORAL_TABLET | Freq: Every day | ORAL | Status: DC
  Administered 2020-02-11 – 2020-02-16 (×6): 50 ug via ORAL
  Filled 2020-02-10 (×6): qty 1

## 2020-02-10 MED ORDER — BUPIVACAINE LIPOSOME 1.3 % IJ SUSP
INTRAMUSCULAR | Status: DC | PRN
  Administered 2020-02-10: 50 mL

## 2020-02-10 MED ORDER — CHLORHEXIDINE GLUCONATE 0.12 % MT SOLN
15.0000 mL | OROMUCOSAL | Status: AC
  Administered 2020-02-10: 15 mL via OROMUCOSAL

## 2020-02-10 MED ORDER — SODIUM CHLORIDE (PF) 0.9 % IJ SOLN
INTRAMUSCULAR | Status: AC
  Filled 2020-02-10: qty 10

## 2020-02-10 MED ORDER — CHLORHEXIDINE GLUCONATE 4 % EX LIQD: 30.0000 mL | CUTANEOUS | Status: DC

## 2020-02-10 MED ORDER — ONDANSETRON HCL 4 MG/2ML IJ SOLN
INTRAMUSCULAR | Status: DC | PRN
Start: 2020-02-10 — End: 2020-02-10
  Administered 2020-02-10: 4 mg via INTRAVENOUS

## 2020-02-10 MED ORDER — POTASSIUM CHLORIDE 10 MEQ/50ML IV SOLN
10.0000 meq | INTRAVENOUS | Status: AC
  Administered 2020-02-10 (×3): 10 meq via INTRAVENOUS

## 2020-02-10 MED ORDER — MIDAZOLAM HCL 5 MG/5ML IJ SOLN
INTRAMUSCULAR | Status: DC | PRN
  Administered 2020-02-10 (×3): 2 mg via INTRAVENOUS

## 2020-02-10 MED ORDER — SODIUM CHLORIDE 0.9% FLUSH
3.0000 mL | Freq: Two times a day (BID) | INTRAVENOUS | Status: DC
  Administered 2020-02-11 – 2020-02-13 (×4): 3 mL via INTRAVENOUS

## 2020-02-10 MED ORDER — TRAMADOL HCL 50 MG PO TABS
50.0000 mg | ORAL_TABLET | ORAL | Status: DC | PRN
  Administered 2020-02-10 (×2): 50 mg via ORAL
  Administered 2020-02-11: 100 mg via ORAL
  Administered 2020-02-16: 50 mg via ORAL
  Filled 2020-02-10: qty 1
  Filled 2020-02-10 (×2): qty 2

## 2020-02-10 MED ORDER — MAGNESIUM SULFATE 4 GM/100ML IV SOLN
4.0000 g | Freq: Once | INTRAVENOUS | Status: AC
  Administered 2020-02-10: 4 g via INTRAVENOUS
  Filled 2020-02-10: qty 100

## 2020-02-10 MED ORDER — TRANEXAMIC ACID 1000 MG/10ML IV SOLN
INTRAVENOUS | Status: DC | PRN
  Administered 2020-02-10: 1.5 mg/kg/h via INTRAVENOUS

## 2020-02-10 MED ORDER — LACTATED RINGERS IV SOLN: INTRAVENOUS | Status: DC | PRN

## 2020-02-10 MED ORDER — SODIUM CHLORIDE 0.9% FLUSH
10.0000 mL | Freq: Two times a day (BID) | INTRAVENOUS | Status: DC
  Administered 2020-02-11 – 2020-02-13 (×3): 10 mL

## 2020-02-10 MED ORDER — HEPARIN SODIUM (PORCINE) 1000 UNIT/ML IJ SOLN
INTRAMUSCULAR | Status: DC | PRN
  Administered 2020-02-10: 20000 [IU] via INTRAVENOUS

## 2020-02-10 MED ORDER — ASPIRIN 81 MG PO CHEW: 324.0000 mg | CHEWABLE_TABLET | Freq: Every day | ORAL | Status: DC

## 2020-02-10 MED ORDER — SODIUM CHLORIDE 0.9% FLUSH: 10.0000 mL | INTRAVENOUS | Status: DC | PRN

## 2020-02-10 MED ORDER — ONDANSETRON HCL 4 MG/2ML IJ SOLN
4.0000 mg | Freq: Four times a day (QID) | INTRAMUSCULAR | Status: DC | PRN
  Administered 2020-02-11 – 2020-02-13 (×6): 4 mg via INTRAVENOUS
  Filled 2020-02-10 (×6): qty 2

## 2020-02-10 MED ORDER — BISACODYL 5 MG PO TBEC
10.0000 mg | DELAYED_RELEASE_TABLET | Freq: Every day | ORAL | Status: DC
  Administered 2020-02-11 – 2020-02-13 (×3): 10 mg via ORAL
  Filled 2020-02-10 (×5): qty 2

## 2020-02-10 MED ORDER — DEXMEDETOMIDINE HCL IN NACL 400 MCG/100ML IV SOLN: 0.0000 ug/kg/h | INTRAVENOUS | Status: DC

## 2020-02-10 MED ORDER — 0.9 % SODIUM CHLORIDE (POUR BTL) OPTIME
TOPICAL | Status: DC | PRN
  Administered 2020-02-10: 4000 mL

## 2020-02-10 MED ORDER — VANCOMYCIN HCL IN DEXTROSE 1-5 GM/200ML-% IV SOLN
1000.0000 mg | Freq: Once | INTRAVENOUS | Status: AC
  Administered 2020-02-10: 1000 mg via INTRAVENOUS
  Filled 2020-02-10: qty 200

## 2020-02-10 MED ORDER — PHENYLEPHRINE HCL (PRESSORS) 10 MG/ML IV SOLN
INTRAVENOUS | Status: DC | PRN
  Administered 2020-02-10: 80 ug via INTRAVENOUS

## 2020-02-10 MED ORDER — LACTATED RINGERS IV SOLN: 500.0000 mL | Freq: Once | INTRAVENOUS | Status: DC | PRN

## 2020-02-10 MED ORDER — OXYCODONE HCL 5 MG PO TABS: 5.0000 mg | ORAL_TABLET | ORAL | Status: DC | PRN

## 2020-02-10 MED ORDER — FLECAINIDE ACETATE 50 MG PO TABS
75.0000 mg | ORAL_TABLET | Freq: Two times a day (BID) | ORAL | Status: DC
  Filled 2020-02-10: qty 2

## 2020-02-10 MED ORDER — ACETAMINOPHEN 160 MG/5ML PO SOLN: 650.0000 mg | Freq: Once | ORAL | Status: DC

## 2020-02-10 MED ORDER — INSULIN REGULAR(HUMAN) IN NACL 100-0.9 UT/100ML-% IV SOLN: INTRAVENOUS | Status: DC

## 2020-02-10 MED ORDER — DEXAMETHASONE SODIUM PHOSPHATE 10 MG/ML IJ SOLN
INTRAMUSCULAR | Status: DC | PRN
  Administered 2020-02-10: 8 mg via INTRAVENOUS

## 2020-02-10 MED ORDER — PHENYLEPHRINE HCL-NACL 20-0.9 MG/250ML-% IV SOLN: 0.0000 ug/min | INTRAVENOUS | Status: DC

## 2020-02-10 MED ORDER — SODIUM CHLORIDE 0.9 % IV SOLN: INTRAVENOUS | Status: DC

## 2020-02-10 MED ORDER — PROPOFOL 10 MG/ML IV BOLUS
INTRAVENOUS | Status: DC | PRN
  Administered 2020-02-10: 20 mg via INTRAVENOUS

## 2020-02-10 MED ORDER — METOPROLOL TARTRATE 5 MG/5ML IV SOLN: 2.5000 mg | INTRAVENOUS | Status: DC | PRN

## 2020-02-10 MED ORDER — SODIUM CHLORIDE 0.9% FLUSH: 3.0000 mL | INTRAVENOUS | Status: DC | PRN

## 2020-02-10 MED ORDER — PLASMA-LYTE 148 IV SOLN
INTRAVENOUS | Status: DC | PRN
  Administered 2020-02-10: 500 mL

## 2020-02-10 MED ORDER — DEXTROSE 50 % IV SOLN: 0.0000 mL | INTRAVENOUS | Status: DC | PRN

## 2020-02-10 MED ORDER — EPHEDRINE SULFATE-NACL 50-0.9 MG/10ML-% IV SOSY
PREFILLED_SYRINGE | INTRAVENOUS | Status: DC | PRN
  Administered 2020-02-10: 5 mg via INTRAVENOUS
  Administered 2020-02-10 (×2): 10 mg via INTRAVENOUS

## 2020-02-10 MED ORDER — CHLORHEXIDINE GLUCONATE CLOTH 2 % EX PADS
6.0000 | MEDICATED_PAD | Freq: Every day | CUTANEOUS | Status: DC
  Administered 2020-02-10 – 2020-02-12 (×3): 6 via TOPICAL

## 2020-02-10 MED ORDER — PROPOFOL 10 MG/ML IV BOLUS
INTRAVENOUS | Status: AC
  Filled 2020-02-10: qty 20

## 2020-02-10 MED ORDER — ACETAMINOPHEN 500 MG PO TABS
1000.0000 mg | ORAL_TABLET | Freq: Four times a day (QID) | ORAL | Status: AC
  Administered 2020-02-10 – 2020-02-15 (×20): 1000 mg via ORAL
  Filled 2020-02-10 (×21): qty 2

## 2020-02-10 MED ORDER — SODIUM CHLORIDE 0.9 % IV SOLN: 250.0000 mL | INTRAVENOUS | Status: DC

## 2020-02-10 MED ORDER — ALBUMIN HUMAN 5 % IV SOLN: INTRAVENOUS | Status: DC | PRN

## 2020-02-10 MED ORDER — SODIUM CHLORIDE 0.45 % IV SOLN: INTRAVENOUS | Status: DC | PRN

## 2020-02-10 MED ORDER — PANTOPRAZOLE SODIUM 40 MG PO TBEC
40.0000 mg | DELAYED_RELEASE_TABLET | Freq: Every day | ORAL | Status: DC
  Administered 2020-02-12 – 2020-02-16 (×5): 40 mg via ORAL
  Filled 2020-02-10 (×4): qty 1

## 2020-02-10 MED ORDER — SUGAMMADEX SODIUM 200 MG/2ML IV SOLN
INTRAVENOUS | Status: DC | PRN
  Administered 2020-02-10: 150 mg via INTRAVENOUS

## 2020-02-10 MED ORDER — ARTIFICIAL TEARS OPHTHALMIC OINT
TOPICAL_OINTMENT | OPHTHALMIC | Status: DC | PRN
Start: 2020-02-10 — End: 2020-02-10
  Administered 2020-02-10: 1 via OPHTHALMIC

## 2020-02-10 MED ORDER — PROTAMINE SULFATE 10 MG/ML IV SOLN
INTRAVENOUS | Status: DC | PRN
  Administered 2020-02-10: 190 mg via INTRAVENOUS
  Administered 2020-02-10: 10 mg via INTRAVENOUS

## 2020-02-10 MED ORDER — BISACODYL 10 MG RE SUPP: 10.0000 mg | Freq: Every day | RECTAL | Status: DC

## 2020-02-10 MED ORDER — HEPARIN SODIUM (PORCINE) 1000 UNIT/ML IJ SOLN
INTRAMUSCULAR | Status: AC
  Filled 2020-02-10: qty 1

## 2020-02-10 MED ORDER — PHENYLEPHRINE HCL-NACL 10-0.9 MG/250ML-% IV SOLN
INTRAVENOUS | Status: DC | PRN
Start: 2020-02-10 — End: 2020-02-10
  Administered 2020-02-10: 20 ug/min via INTRAVENOUS

## 2020-02-10 MED ORDER — MORPHINE SULFATE (PF) 2 MG/ML IV SOLN
1.0000 mg | INTRAVENOUS | Status: DC | PRN
  Administered 2020-02-10: 2 mg via INTRAVENOUS
  Filled 2020-02-10: qty 1

## 2020-02-10 MED ORDER — ASPIRIN EC 325 MG PO TBEC
325.0000 mg | DELAYED_RELEASE_TABLET | Freq: Every day | ORAL | Status: AC
  Administered 2020-02-11: 325 mg via ORAL
  Filled 2020-02-10: qty 1

## 2020-02-10 MED ORDER — ROCURONIUM BROMIDE 10 MG/ML (PF) SYRINGE
PREFILLED_SYRINGE | INTRAVENOUS | Status: AC
  Filled 2020-02-10: qty 20

## 2020-02-10 MED ORDER — SODIUM CHLORIDE 0.9 % IV SOLN
1.5000 g | Freq: Two times a day (BID) | INTRAVENOUS | Status: AC
  Administered 2020-02-10 – 2020-02-12 (×4): 1.5 g via INTRAVENOUS
  Filled 2020-02-10 (×4): qty 1.5

## 2020-02-10 MED ORDER — FLECAINIDE ACETATE 50 MG PO TABS
75.0000 mg | ORAL_TABLET | Freq: Two times a day (BID) | ORAL | Status: DC
  Administered 2020-02-11 – 2020-02-12 (×3): 75 mg via ORAL
  Filled 2020-02-10 (×3): qty 2

## 2020-02-10 MED ORDER — DOCUSATE SODIUM 100 MG PO CAPS
200.0000 mg | ORAL_CAPSULE | Freq: Every day | ORAL | Status: DC
  Administered 2020-02-11 – 2020-02-14 (×4): 200 mg via ORAL
  Filled 2020-02-10 (×6): qty 2

## 2020-02-10 MED ORDER — FENTANYL CITRATE (PF) 250 MCG/5ML IJ SOLN
INTRAMUSCULAR | Status: DC | PRN
  Administered 2020-02-10: 150 ug via INTRAVENOUS
  Administered 2020-02-10: 50 ug via INTRAVENOUS
  Administered 2020-02-10: 300 ug via INTRAVENOUS
  Administered 2020-02-10: 100 ug via INTRAVENOUS
  Administered 2020-02-10: 50 ug via INTRAVENOUS

## 2020-02-10 MED ORDER — MIDAZOLAM HCL 2 MG/2ML IJ SOLN: 2.0000 mg | INTRAMUSCULAR | Status: DC | PRN

## 2020-02-10 MED ORDER — INSULIN ASPART 100 UNIT/ML ~~LOC~~ SOLN
0.0000 [IU] | SUBCUTANEOUS | Status: DC
  Administered 2020-02-11: 2 [IU] via SUBCUTANEOUS

## 2020-02-10 MED ORDER — CHLORHEXIDINE GLUCONATE 0.12 % MT SOLN
15.0000 mL | Freq: Once | OROMUCOSAL | Status: AC
  Administered 2020-02-10: 15 mL via OROMUCOSAL
  Filled 2020-02-10: qty 15

## 2020-02-10 MED ORDER — LIDOCAINE 2% (20 MG/ML) 5 ML SYRINGE: INTRAMUSCULAR | Status: DC | PRN

## 2020-02-10 MED ORDER — SODIUM CHLORIDE 0.9 % IR SOLN
Status: DC | PRN
  Administered 2020-02-10: 3000 mL
  Administered 2020-02-10: 1000 mL

## 2020-02-10 MED ORDER — NITROGLYCERIN IN D5W 200-5 MCG/ML-% IV SOLN: 0.0000 ug/min | INTRAVENOUS | Status: DC

## 2020-02-10 MED ORDER — ACETAMINOPHEN 160 MG/5ML PO SOLN: 1000.0000 mg | Freq: Four times a day (QID) | ORAL | Status: DC

## 2020-02-10 MED ORDER — PROTAMINE SULFATE 10 MG/ML IV SOLN
INTRAVENOUS | Status: AC
  Filled 2020-02-10: qty 25

## 2020-02-10 MED ORDER — ALBUMIN HUMAN 5 % IV SOLN
250.0000 mL | INTRAVENOUS | Status: AC | PRN
  Administered 2020-02-10 – 2020-02-11 (×5): 12.5 g via INTRAVENOUS
  Filled 2020-02-10 (×3): qty 250

## 2020-02-10 MED ORDER — BUPIVACAINE LIPOSOME 1.3 % IJ SUSP
20.0000 mL | Freq: Once | INTRAMUSCULAR | Status: DC
  Administered 2020-02-10: 266 mg
  Filled 2020-02-10: qty 20

## 2020-02-10 MED ORDER — METOPROLOL TARTRATE 12.5 MG HALF TABLET: 12.5000 mg | ORAL_TABLET | Freq: Once | ORAL | Status: DC

## 2020-02-10 MED ORDER — ACETAMINOPHEN 650 MG RE SUPP: 650.0000 mg | Freq: Once | RECTAL | Status: DC

## 2020-02-10 SURGICAL SUPPLY — 119 items
ADAPTER CARDIO PERF ANTE/RETRO (ADAPTER) ×3 IMPLANT
ADH SKN CLS APL DERMABOND .7 (GAUZE/BANDAGES/DRESSINGS) ×4
ADPR PRFSN 84XANTGRD RTRGD (ADAPTER) ×2
BAG DECANTER FOR FLEXI CONT (MISCELLANEOUS) ×6 IMPLANT
BLADE CLIPPER SURG (BLADE) ×3 IMPLANT
BLADE SURG 11 STRL SS (BLADE) ×3 IMPLANT
CANISTER SUCT 3000ML PPV (MISCELLANEOUS) ×6 IMPLANT
CANNULA FEM VENOUS REMOTE 22FR (CANNULA) ×1 IMPLANT
CANNULA FEMORAL ART 14 SM (MISCELLANEOUS) ×3 IMPLANT
CANNULA GUNDRY RCSP 15FR (MISCELLANEOUS) ×3 IMPLANT
CANNULA OPTISITE PERFUSION 16F (CANNULA) IMPLANT
CANNULA OPTISITE PERFUSION 18F (CANNULA) ×1 IMPLANT
CANNULA SUMP PERICARDIAL (CANNULA) ×6 IMPLANT
CATH CPB KIT OWEN (MISCELLANEOUS) IMPLANT
CATH KIT ON-Q SILVERSOAK 5 (CATHETERS) IMPLANT
CATH KIT ON-Q SILVERSOAK 5IN (CATHETERS) IMPLANT
CELLS DAT CNTRL 66122 CELL SVR (MISCELLANEOUS) ×2 IMPLANT
CNTNR URN SCR LID CUP LEK RST (MISCELLANEOUS) ×2 IMPLANT
CONN ST 1/4X3/8  BEN (MISCELLANEOUS) ×6
CONN ST 1/4X3/8 BEN (MISCELLANEOUS) ×4 IMPLANT
CONNECTOR 1/2X3/8X1/2 3 WAY (MISCELLANEOUS) ×3
CONNECTOR 1/2X3/8X1/2 3WAY (MISCELLANEOUS) ×2 IMPLANT
CONT SPEC 4OZ STRL OR WHT (MISCELLANEOUS) ×3
COVER BACK TABLE 24X17X13 BIG (DRAPES) ×3 IMPLANT
COVER PROBE W GEL 5X96 (DRAPES) ×3 IMPLANT
DERMABOND ADVANCED (GAUZE/BANDAGES/DRESSINGS) ×2
DERMABOND ADVANCED .7 DNX12 (GAUZE/BANDAGES/DRESSINGS) ×4 IMPLANT
DEVICE CLOSURE PERCLS PRGLD 6F (VASCULAR PRODUCTS) ×8 IMPLANT
DEVICE SUT CK QUICK LOAD INDV (Prosthesis & Implant Heart) ×1 IMPLANT
DEVICE SUT CK QUICK LOAD MINI (Prosthesis & Implant Heart) ×3 IMPLANT
DEVICE TROCAR PUNCTURE CLOSURE (ENDOMECHANICALS) ×3 IMPLANT
DRAIN CHANNEL 32F RND 10.7 FF (WOUND CARE) ×6 IMPLANT
DRAPE C-ARM 42X72 X-RAY (DRAPES) ×3 IMPLANT
DRAPE CV SPLIT W-CLR ANES SCRN (DRAPES) ×3 IMPLANT
DRAPE INCISE IOBAN 66X45 STRL (DRAPES) ×8 IMPLANT
DRAPE PERI GROIN 82X75IN TIB (DRAPES) ×3 IMPLANT
DRAPE SLUSH/WARMER DISC (DRAPES) ×3 IMPLANT
DRSG AQUACEL AG ADV 3.5X 6 (GAUZE/BANDAGES/DRESSINGS) ×1 IMPLANT
DRSG COVADERM 4X8 (GAUZE/BANDAGES/DRESSINGS) ×3 IMPLANT
ELECT BLADE 6.5 EXT (BLADE) ×3 IMPLANT
ELECT REM PT RETURN 9FT ADLT (ELECTROSURGICAL) ×6
ELECTRODE REM PT RTRN 9FT ADLT (ELECTROSURGICAL) ×4 IMPLANT
FELT TEFLON 1X6 (MISCELLANEOUS) ×4 IMPLANT
FEMORAL VENOUS CANN RAP (CANNULA) IMPLANT
GAUZE SPONGE 4X4 12PLY STRL LF (GAUZE/BANDAGES/DRESSINGS) ×3 IMPLANT
GLOVE BIO SURGEON STRL SZ 6.5 (GLOVE) ×2 IMPLANT
GLOVE BIO SURGEON STRL SZ7 (GLOVE) ×1 IMPLANT
GLOVE BIOGEL PI IND STRL 6.5 (GLOVE) IMPLANT
GLOVE BIOGEL PI IND STRL 7.5 (GLOVE) IMPLANT
GLOVE BIOGEL PI INDICATOR 6.5 (GLOVE) ×1
GLOVE BIOGEL PI INDICATOR 7.5 (GLOVE) ×4
GLOVE ORTHO TXT STRL SZ7.5 (GLOVE) ×8 IMPLANT
GLOVE SURG SS PI 7.0 STRL IVOR (GLOVE) ×1 IMPLANT
GLOVE SURG SS PI 7.5 STRL IVOR (GLOVE) ×1 IMPLANT
GOWN STRL REUS W/ TWL LRG LVL3 (GOWN DISPOSABLE) ×8 IMPLANT
GOWN STRL REUS W/TWL LRG LVL3 (GOWN DISPOSABLE) ×15
GRASPER SUT TROCAR 14GX15 (MISCELLANEOUS) ×3 IMPLANT
IV NS 1000ML (IV SOLUTION) ×3
IV NS 1000ML BAXH (IV SOLUTION) IMPLANT
IV NS IRRIG 3000ML ARTHROMATIC (IV SOLUTION) ×1 IMPLANT
KIT BASIN OR (CUSTOM PROCEDURE TRAY) ×3 IMPLANT
KIT DILATOR VASC 18G NDL (KITS) ×3 IMPLANT
KIT DRAINAGE VACCUM ASSIST (KITS) ×1 IMPLANT
KIT SUCTION CATH 14FR (SUCTIONS) ×3 IMPLANT
KIT SUT CK MINI COMBO 4X17 (Prosthesis & Implant Heart) ×1 IMPLANT
KIT TURNOVER KIT B (KITS) ×3 IMPLANT
LEAD PACING MYOCARDI (MISCELLANEOUS) ×3 IMPLANT
LINE VENT (MISCELLANEOUS) ×1 IMPLANT
NDL AORTIC ROOT 14G 7F (CATHETERS) ×2 IMPLANT
NEEDLE AORTIC ROOT 14G 7F (CATHETERS) ×3 IMPLANT
NS IRRIG 1000ML POUR BTL (IV SOLUTION) ×14 IMPLANT
PACK E MIN INVASIVE VALVE (SUTURE) ×3 IMPLANT
PACK OPEN HEART (CUSTOM PROCEDURE TRAY) ×3 IMPLANT
PAD ARMBOARD 7.5X6 YLW CONV (MISCELLANEOUS) ×6 IMPLANT
PAD ELECT DEFIB RADIOL ZOLL (MISCELLANEOUS) ×3 IMPLANT
PENCIL BUTTON HOLSTER BLD 10FT (ELECTRODE) ×1 IMPLANT
PERCLOSE PROGLIDE 6F (VASCULAR PRODUCTS) ×24
POSITIONER HEAD DONUT 9IN (MISCELLANEOUS) ×3 IMPLANT
RETRACTOR WND ALEXIS 18 MED (MISCELLANEOUS) ×2 IMPLANT
RING MITRAL MEMO 4D 32 (Prosthesis & Implant Heart) ×1 IMPLANT
RTRCTR WOUND ALEXIS 18CM MED (MISCELLANEOUS) ×3
SET CANNULATION TOURNIQUET (MISCELLANEOUS) ×3 IMPLANT
SET CARDIOPLEGIA MPS 5001102 (MISCELLANEOUS) ×1 IMPLANT
SET IRRIG TUBING LAPAROSCOPIC (IRRIGATION / IRRIGATOR) ×3 IMPLANT
SET MICROPUNCTURE 5F STIFF (MISCELLANEOUS) ×3 IMPLANT
SHEATH PINNACLE 8F 10CM (SHEATH) ×9 IMPLANT
SIZER CHORD-X CHORDAL CXCS (SIZER) ×1 IMPLANT
SOL ANTI FOG 6CC (MISCELLANEOUS) ×2 IMPLANT
SOLUTION ANTI FOG 6CC (MISCELLANEOUS) ×1
SUT BONE WAX W31G (SUTURE) ×3 IMPLANT
SUT ETHIBOND (SUTURE) ×3 IMPLANT
SUT ETHIBOND 2 0 SH (SUTURE) ×1 IMPLANT
SUT ETHIBOND 2-0 RB-1 WHT (SUTURE) ×3 IMPLANT
SUT ETHIBOND X763 2 0 SH 1 (SUTURE) ×3 IMPLANT
SUT GORETEX CV 4 TH 22 36 (SUTURE) IMPLANT
SUT GORETEX CV4 TH-18 (SUTURE) IMPLANT
SUT PROLENE 3 0 SH DA (SUTURE) ×1 IMPLANT
SUT PROLENE 3 0 SH1 36 (SUTURE) ×14 IMPLANT
SUT PROLENE 4 0 RB 1 (SUTURE) ×33
SUT PROLENE 4-0 RB1 .5 CRCL 36 (SUTURE) IMPLANT
SUT PROLENE 6 0 C 1 30 (SUTURE) ×1 IMPLANT
SUT PTFE CHORD X 16MM (SUTURE) ×2 IMPLANT
SUT SILK  1 MH (SUTURE) ×9
SUT SILK 1 MH (SUTURE) IMPLANT
SUT VIC AB 2-0 CTX 27 (SUTURE) ×1 IMPLANT
SYR 10ML LL (SYRINGE) ×1 IMPLANT
SYSTEM SAHARA CHEST DRAIN ATS (WOUND CARE) ×3 IMPLANT
TAPE CLOTH SURG 4X10 WHT LF (GAUZE/BANDAGES/DRESSINGS) ×1 IMPLANT
TAPE PAPER 2X10 WHT MICROPORE (GAUZE/BANDAGES/DRESSINGS) ×1 IMPLANT
TOWEL GREEN STERILE (TOWEL DISPOSABLE) ×2 IMPLANT
TOWEL GREEN STERILE FF (TOWEL DISPOSABLE) ×4 IMPLANT
TRAY FOLEY SLVR 16FR TEMP STAT (SET/KITS/TRAYS/PACK) ×3 IMPLANT
TROCAR XCEL BLADELESS 5X75MML (TROCAR) ×3 IMPLANT
TROCAR XCEL NON-BLD 11X100MML (ENDOMECHANICALS) ×6 IMPLANT
TUBE SUCT INTRACARD DLP 20F (MISCELLANEOUS) ×3 IMPLANT
TUNNELER SHEATH ON-Q 11GX8 DSP (PAIN MANAGEMENT) IMPLANT
UNDERPAD 30X36 HEAVY ABSORB (UNDERPADS AND DIAPERS) ×3 IMPLANT
WATER STERILE IRR 1000ML POUR (IV SOLUTION) ×6 IMPLANT
WIRE EMERALD 3MM-J .035X150CM (WIRE) ×3 IMPLANT

## 2020-02-10 NOTE — Transfer of Care (Signed)
Immediate Anesthesia Transfer of Care Note  Patient: Kellie Humphrey  Procedure(s) Performed: MINIMALLY INVASIVE MITRAL VALVE REPAIR (MVR) using Memo 4D 32 MM Mitral Valve Ring. (Right Chest) TRANSESOPHAGEAL ECHOCARDIOGRAM (TEE) (N/A )  Patient Location: SICU  Anesthesia Type:General  Level of Consciousness: sedated  Airway & Oxygen Therapy: Patient Spontanous Breathing, Patient connected to face mask oxygen and oral airway  Post-op Assessment: Report given to RN and Post -op Vital signs reviewed and stable  Post vital signs: Reviewed and stable  Last Vitals:  Vitals Value Taken Time  BP    Temp    Pulse    Resp    SpO2      Last Pain:  Vitals:   02/10/20 0611  TempSrc:   PainSc: 0-No pain         Complications: No apparent anesthesia complications

## 2020-02-10 NOTE — Progress Notes (Signed)
  Echocardiogram Echocardiogram Transesophageal has been performed.  Bobbye Charleston 02/10/2020, 9:26 AM

## 2020-02-10 NOTE — Op Note (Signed)
CARDIOTHORACIC SURGERY OPERATIVE NOTE  Date of Procedure:  02/10/2020  Preoperative Diagnosis: Severe Mitral Regurgitation  Postoperative Diagnosis: Same  Procedure:    Minimally-Invasive Mitral Valve Repair  Complex valvuloplasty including artificial Gore-tex neochord placement x8  Edge-to-edge suture plication of anterior commissure  [name] 4D Ring Annuloplasty (size 72mm, catalog # [id number], serial # [id number])    Surgeon: Valentina Gu. Roxy Manns, MD  Assistant: Enid Cutter, PA-C  Anesthesia: [name], MD  Operative Findings:  Barlow's type myxomatous degenerative disease  Mitral annular calcificiation with severe calcification beneath anterior commissure  Bileaflet prolapse with elongated primary chordae tendinae  Type II dysfunction with severe mitral regurgitation  Dilated left ventricle with normal left ventricular systolic function  No residual mitral regurgitation after successful valve repair                    BRIEF CLINICAL NOTE AND INDICATIONS FOR SURGERY  Patient is a 74 year old female with history of mitral valve prolapse and moderate to severe asymptomatic primary mitral regurgitation. She was originally seen in consultation on August 31, 2018 and I saw her more recently on March 07, 2019. At that time she continued to do well and reports no significant symptoms. She had originally presented with history of palpitations and event monitors revealed frequent PVCs and PACs. Symptoms improved with oral flecainide therapy. She underwent stress echocardiography in May 2020 and did well with no significant symptoms of exertional shortness of breath and normal left ventricular systolic function. Continued medical therapy with close observation was recommended.  I spoke with the patient over the telephoneon December 28, 2023for routine follow-up. Shepreviouslyunderwent transthoracic echocardiogram June 27, 2019 which revealed  stable findings with normal left ventricular size and systolic function and bileaflet prolapse with moderate to severe mitral regurgitation. However, the patient does report that over the past several months she has started to notice a tendency to get mildly short of breath with more strenuous exertion, such as walking up a hill.Since then she was seen in follow-up by Dr. [name] on November 23, 2019 and she subsequently underwent transesophageal echocardiogram on December 12, 2019 TEE revealed bileaflet prolapse with moderate to severe mitral regurgitation. Left ventricular systolic function remain normal with ejection fraction estimated 55 to 60%. There was mild left atrial enlargement. Right ventricular size and function was normal.  The patient has been seen in consultation and counseled at length regarding the indications, risks and potential benefits of surgery.  All questions have been answered, and the patient provides full informed consent for the operation as described.    DETAILS OF THE OPERATIVE PROCEDURE  Preparation:  The patient is brought to the operating room on the above mentioned date and central monitoring was established by the anesthesia team including placement of Swan-Ganz catheter through the left internal jugular vein.  A radial arterial line is placed. The patient is placed in the supine position on the operating table.  Intravenous antibiotics are administered. General endotracheal anesthesia is induced uneventfully. The patient is initially intubated using a dual lumen endotracheal tube.  A Foley catheter is placed.  Baseline transesophageal echocardiogram was performed.  Findings were notable for bileaflet mitral prolapse with severe mitral regurgitation.  There were multiple jets of mitral regurgitation with particular severe jet located close to the anterior commissure.  There was severe bileaflet prolapse without obvious ruptured primary chordae tendinae.  There was  flow reversal in the pulmonary veins.  There was normal left ventricular systolic function.  The left  ventricle was dilated.  A soft roll is placed behind the patient's left scapula and the neck gently extended and turned to the left.   The patient's right neck, chest, abdomen, both groins, and both lower extremities are prepared and draped in a sterile manner. A time out procedure is performed.   Percutaneous Vascular Access:  Percutaneous arterial and venous access were obtained on the right side.  Using ultrasound guidance the right common femoral vein was cannulated using the Seldinger technique a pair of Perclose vascular closure devises were placed at opposing 30 degree angles in the femoral vein, after which time an 8 French sheath inserted.  The right common femoral artery was cannulated using a micropuncture wire and sheath.  A pair of Perclose vascular closure devices were placed at opposing 30 degree angles in the femoral artery, and a 8 French sheath inserted.  The right internal jugular vein was cannulated  using ultrasound guidance and an 8 French sheath inserted.     Surgical Approach:  A right miniature anterolateral thoracotomy incision is performed. The incision is placed just lateral to and superior to the right nipple. The pectoralis major muscle is retracted medially and completely preserved. The right pleural space is entered through the 3rd intercostal space. A soft tissue retractor is placed.  Two 11 mm ports are placed through separate stab incisions inferiorly. The right pleural space is insufflated continuously with carbon dioxide gas through the posterior port during the remainder of the operation.  A pledgeted sutures placed through the dome of the right hemidiaphragm and retracted inferiorly to facilitate exposure.  A longitudinal incision is made in the pericardium 3 cm anterior to the phrenic nerve and silk traction sutures are placed on either side of the incision for  exposure.   Extracorporeal Cardiopulmonary Bypass and Myocardial Protection:   The patient was heparinized systemically.  The right common femoral vein is cannulated through the venous sheath and a guidewire advanced into the right atrium using TEE guidance.  The femoral vein cannulated using a 22 Fr long femoral venous cannula.  The right common femoral artery is cannulated through the arterial sheath and a guidewire advanced into the descending thoracic aorta using TEE guidance.  Femoral artery is cannulated with a 18 French femoral arterial cannula.  The right internal jugular vein is cannulated through the venous sheath and a guidewire advanced into the right atrium.  The internal jugular vein is cannulated using a 14 French pediatric femoral venous cannula.   Adequate heparinization is verified.   The entire pre-bypass portion of the operation was notable for stable hemodynamics.  Cardiopulmonary bypass was begun.  Vacuum assist venous drainage is utilized. The incision in the pericardium is extended in both directions. Venous drainage and exposure are notably excellent. A retrograde cardioplegia cannula is placed through the right atrium into the coronary sinus using transesophageal echocardiogram guidance.  An antegrade cardioplegia cannula is placed in the ascending aorta.    The patient is cooled to 32C systemic temperature.  The aortic cross clamp is applied and cardioplegia is delivered initially in an antegrade fashion through the aortic root using modified [place] cold blood cardioplegia ([facility] blood cardioplegia protocol).   The initial cardioplegic arrest is rapid with early diastolic arrest.  Repeat doses of cardioplegia are administered at 90 minutes and every 30 minutes thereafter through the coronary sinus catheter in order to maintain completely flat electrocardiogram.  Myocardial protection was felt to be excellent.   Mitral Valve Repair:  A  left atriotomy incision was  performed through the interatrial groove and extended partially across the back wall of the left atrium after opening the oblique sinus inferiorly.  The mitral valve is exposed using a self-retaining retractor.  The mitral valve was inspected and notable for classical Barlow's type myxomatous disease with severe billowing and bileaflet prolapse.  There was mitral annular calcification extending around the majority of the posterior annulus.  There was severe calcification involving the subvalvular apparatus beneath the anterior commissure with some restriction of leaflet mobility in the anterior commissure.  For the most part there was excessive mobility with bileaflet prolapse and severe elongation of essentially all of the primary chordae tendinae although there were no ruptured cords.  There was moderate leaflet sclerosis with a few areas of leaflet calcification..  Interrupted 2-0 Ethibond horizontal mattress sutures are placed circumferentially around the entire mitral valve annulus. The sutures will ultimately be utilized for ring annuloplasty, and at this juncture there are utilized to suspend the valve symmetrically.  The area associated with the anterior commissure was carefully inspected.  Despite the severe calcification beneath the leaflet, both leaflets had space and were not completely calcified or attached to the subvalvular calcification.  The anterior leaflet was subsequently closed using edge-to-edge repair at the commissure with multiple interrupted everting 4-0 Prolene sutures.  Artificial neochord placement was performed using Chord-X multi-strand CV-4 Goretex pre-measured loops.  The appropriate cord length (10mm) was measured from corresponding normal length primary cords from the P3 segment of the posterior leaflet.  Cord length was chosen to create symmetrical shortening and correction of the prolapse involving the entire posterior leaflet.  The papillary muscle suture of a Chord-X  multi-strand suture was placed through the head of the posterior papillary muscle in a horizontal mattress fashion and tied over Teflon felt pledgets. Each of the three pre-measured loops were then reimplanted into the free margin of the P2 segment of the posterior leaflet on the posterior side of midline.  The papillary muscle suture of a second Chord-X multi-strand suture was placed through the head of the anterior papillary muscle in a horizontal mattress fashion and tied over Teflon felt pledgets. Each of the three pre-measured loops were then reimplanted into the free margin of the P1 and P2 segments of the posterior leaflet on the anterior side of midline.    The valve was tested with saline and appears reasonably competent although there was excessive restriction of the P2 segment from 2 pairs of the artificial cords.  These were removed leaving a total of 8 neocords.  The cleft between P2 and P3 was closed with interrupted everting 4-0 Prolene suture to correct billowing and prolapse of the P3 segment.  The valve was tested with saline and appeared competent even without ring annuloplasty complete. The valve was sized to a 32 mm annuloplasty ring, based upon the transverse distance between the left and right commissures and the height of the anterior leaflet, corresponding to a size just slightly larger than the overall surface area of the anterior leaflet.  A [name] 4D annuloplasty ring (size 18mm, catalog #[id number], serial [id number]) was secured in place uneventfully. All ring sutures were secured using a Cor-knot device.    The valve was tested with saline and appeared competent. There is no residual leak. There was a broad, symmetrical line of coaptation of the anterior and posterior leaflet which was confirmed using the blue ink test.  Rewarming is begun.   Procedure Completion:  The atriotomy  was closed using a 2-layer closure of running 3-0 Prolene suture after placing a sump drain across  the mitral valve to serve as a left ventricular vent.  One final dose of warm retrograde "reanimation dose" cardioplegia was administered retrograde through the coronary sinus catheter while all air was evacuated through the aortic root.  The aortic cross clamp was removed after a total cross clamp time of 156 minutes.  Epicardial pacing wires are fixed to the inferior wall of the right ventricule and to the right atrial appendage. The patient is rewarmed to 37C temperature. The left ventricular vent and antegrade cardioplegia cannula are removed. The patient is weaned and disconnected from cardiopulmonary bypass.  The patient's rhythm at separation from bypass was sinus bradycardia.  Atrial pacing was employed.  The patient was weaned from bypass  without any inotropic support. Total cardiopulmonary bypass time for the operation was 199 minutes.  Followup transesophageal echocardiogram performed after separation from bypass revealed a well-seated annuloplasty ring in the mitral position with a normal functioning mitral valve. There was no residual leak.  Left ventricular function was unchanged from preoperatively.  The mean gradient across the mitral valve was estimated to be 3-4 mmHg.  The femoral arterial and venous cannulas were removed and all Perclose sutures secured.  Manual pressure was maintained while Protamine was administered.  The right internal jugular cannula was removed and manual pressure held on the neck and groin for 15 minutes.  Single lung ventilation was begun. The atriotomy closure was inspected for hemostasis. The pericardial sac was drained using a 28 French Bard drain placed through the anterior port incision.  The right pleural space is irrigated with saline solution and inspected for hemostasis.   A mixture of Exparel liposomal bupivacaine (20 mL) and 0.5% bupivacaine (30 mL) is utilized to create an intercostal nerve block for postoperative analgesia.  The mixture is injected  under direct vision into the intercostal neurovascular bundles posteriorly to cover the second through the sixth intercostal nerve roots.  Portions of the solution are also injected into the intercostal neurovascular bundles immediately surrounding the surgical incision and immediately adjacent to the chest tube exit sites.  The right pleural space was drained using a 28 French Bard drain placed through the posterior port incision. The miniature thoracotomy incision was closed in multiple layers in routine fashion.   The post-bypass portion of the operation was notable for stable rhythm and hemodynamics.  No blood products were administered during the operation.   Disposition:  The patient tolerated the procedure well.  The patient was extubated in the operating room and subsequently transported to the surgical intensive care unit in stable condition. There were no intraoperative complications. All sponge instrument and needle counts are verified correct at completion of the operation.     Valentina Gu. Roxy Manns MD 02/10/2020 1:55 PM

## 2020-02-10 NOTE — Anesthesia Postprocedure Evaluation (Signed)
Anesthesia Post Note  Patient: [name]  Procedure(s) Performed: MINIMALLY INVASIVE MITRAL VALVE REPAIR (MVR) using Memo 4D 32 MM Mitral Valve Ring. (Right Chest) TRANSESOPHAGEAL ECHOCARDIOGRAM (TEE) (N/A )     Patient location during evaluation: SICU Anesthesia Type: General Level of consciousness: sedated, oriented and patient cooperative Pain management: pain level controlled Vital Signs Assessment: post-procedure vital signs reviewed and stable Respiratory status: spontaneous breathing, nonlabored ventilation, respiratory function stable and patient connected to nasal cannula oxygen Cardiovascular status: blood pressure returned to baseline and stable Postop Assessment: no apparent nausea or vomiting Anesthetic complications: no    Last Vitals:  Vitals:   02/10/20 1600 02/10/20 1625  BP: 102/75   Pulse: 80 80  Resp: 11 12  Temp: (!) 35.4 C (!) 35.6 C  SpO2: 100% 100%    Last Pain:  Vitals:   02/10/20 1631  TempSrc:   PainSc: 8                  Danikah Budzik,E. Angell Honse

## 2020-02-10 NOTE — Interval H&P Note (Signed)
History and Physical Interval Note:  02/10/2020 6:07 AM  Kellie Humphrey  has presented today for surgery, with the diagnosis of MR.  The various methods of treatment have been discussed with the patient and family. After consideration of risks, benefits and other options for treatment, the patient has consented to  Procedure(s): MINIMALLY INVASIVE MITRAL VALVE REPAIR (MVR) (Right) TRANSESOPHAGEAL ECHOCARDIOGRAM (TEE) (N/A) as a surgical intervention.  The patient's history has been reviewed, patient examined, no change in status, stable for surgery.  I have reviewed the patient's chart and labs.  Questions were answered to the patient's satisfaction.     Rexene Alberts

## 2020-02-10 NOTE — Anesthesia Procedure Notes (Signed)
Procedure Name: Intubation Date/Time: 02/10/2020 8:03 AM Performed by: Leonor Liv, CRNA Pre-anesthesia Checklist: Patient identified, Emergency Drugs available, Suction available and Patient being monitored Patient Re-evaluated:Patient Re-evaluated prior to induction Oxygen Delivery Method: Circle System Utilized Preoxygenation: Pre-oxygenation with 100% oxygen Induction Type: IV induction Ventilation: Mask ventilation without difficulty Laryngoscope Size: Mac and 3 Grade View: Grade II Endobronchial tube: Left, Double lumen EBT and EBT position confirmed by auscultation and 35 Fr Number of attempts: 2 (37 vivasight too large to advance. 35 inserted easily) Airway Equipment and Method: Stylet and Oral airway Placement Confirmation: ETT inserted through vocal cords under direct vision,  positive ETCO2 and breath sounds checked- equal and bilateral Secured at: 29 (teeth) cm Tube secured with: Tape Dental Injury: Teeth and Oropharynx as per pre-operative assessment

## 2020-02-10 NOTE — Anesthesia Procedure Notes (Signed)
Arterial Line Insertion Start/End5/14/2021 7:00 AM, 02/10/2020 7:06 AM Performed by: Leonor Liv, CRNA, CRNA  Patient location: Pre-op. Preanesthetic checklist: patient identified, IV checked, site marked, risks and benefits discussed, surgical consent, monitors and equipment checked, pre-op evaluation, timeout performed and anesthesia consent Lidocaine 1% used for infiltration and patient sedated Left, radial was placed Catheter size: 20 G Hand hygiene performed  and maximum sterile barriers used  Allen's test indicative of satisfactory collateral circulation Attempts: 1 Procedure performed without using ultrasound guided technique. Following insertion, dressing applied and Biopatch. Post procedure assessment: normal  Patient tolerated the procedure well with no immediate complications.

## 2020-02-10 NOTE — Brief Op Note (Signed)
02/10/2020  12:33 PM  PATIENT:  Kellie Humphrey  74 y.o. female  PRE-OPERATIVE DIAGNOSIS:  MITRAL REGURGITATION  POST-OPERATIVE DIAGNOSIS:  MITRAL REGURGITATION  PROCEDURE:  Procedure(s): MINIMALLY INVASIVE MITRAL VALVE REPAIR (MVR) using Memo 4D 32 MM Mitral Valve Ring and Gortex neo-chord placement   TRANSESOPHAGEAL ECHOCARDIOGRAM  SURGEON: Rexene Alberts, MD - Primary  PHYSICIAN ASSISTANT: Amman Bartel  ANESTHESIA:   general  EBL:  Per anesthesia and perfusion records   BLOOD ADMINISTERED:none  DRAINS: Mediastinal and right pleural drains   LOCAL MEDICATIONS USED:  NONE  SPECIMEN:  No Specimen  DISPOSITION OF SPECIMEN:  N/A  COUNTS:  Correct  DICTATION: .Dragon Dictation  PLAN OF CARE: Admit to inpatient   PATIENT DISPOSITION:  ICU - intubated and hemodynamically stable.   Delay start of Pharmacological VTE agent (>24hrs) due to surgical blood loss or risk of bleeding: yes

## 2020-02-10 NOTE — Progress Notes (Signed)
[facility] BRIEF SICU PROGRESS NOTE  Day of Surgery  S/P Procedure(s) (LRB): MINIMALLY INVASIVE MITRAL VALVE REPAIR (MVR) using Memo 4D 32 MM Mitral Valve Ring. (Right) TRANSESOPHAGEAL ECHOCARDIOGRAM (TEE) (N/A)   Doing well Minimal pain Breathing comfortably w/ O2 sats 100% on 2 L/min NSR w/ stable hemodynamics, no drips Minimal chest tube output Excellent UOP Labs okay  Plan: Continue routine early postop  Rexene Alberts, MD 02/10/2020 6:34 PM

## 2020-02-10 NOTE — Anesthesia Procedure Notes (Signed)
Central Venous Catheter Insertion Performed by: Annye Asa, MD, anesthesiologist Start/End5/14/2021 7:01 AM, 02/10/2020 7:19 AM Patient location: OR. Preanesthetic checklist: patient identified, IV checked, site marked, risks and benefits discussed, surgical consent, monitors and equipment checked, pre-op evaluation, timeout performed and anesthesia consent Position: Trendelenburg Lidocaine 1% used for infiltration and patient sedated Hand hygiene performed , maximum sterile barriers used  and Seldinger technique used Catheter size: 8.5 Fr PA cath was placed.Sheath introducer Swan type:thermodilution Procedure performed using ultrasound guided technique. Ultrasound Notes:anatomy identified, needle tip was noted to be adjacent to the nerve/plexus identified, no ultrasound evidence of intravascular and/or intraneural injection and image(s) printed for medical record Attempts: 1 Following insertion, Biopatch, dressing applied and line sutured. Post procedure assessment: blood return through all ports, free fluid flow and no air  Patient tolerated the procedure well with no immediate complications. Additional procedure comments: PA catheter:  Routine monitors. Timeout, sterile prep, drape, FBP L neck.  Trendelenburg position.  1% Lido local, finder and trocar LIJ 1st pass with US guidance.  Cordis placed over J wire. PA catheter in easily.  Sterile dressing applied.  Patient tolerated well, VSS.  Jenita Seashore, MD.

## 2020-02-11 ENCOUNTER — Inpatient Hospital Stay (HOSPITAL_COMMUNITY): Payer: PPO

## 2020-02-11 LAB — BASIC METABOLIC PANEL
Anion gap: 10 (ref 5–15)
Anion gap: 7 (ref 5–15)
BUN: 7 mg/dL — ABNORMAL LOW (ref 8–23)
BUN: 8 mg/dL (ref 8–23)
CO2: 22 mmol/L (ref 22–32)
CO2: 23 mmol/L (ref 22–32)
Calcium: 7.8 mg/dL — ABNORMAL LOW (ref 8.9–10.3)
Calcium: 8.2 mg/dL — ABNORMAL LOW (ref 8.9–10.3)
Chloride: 106 mmol/L (ref 98–111)
Chloride: 105 mmol/L (ref 98–111)
Creatinine, Ser: 0.54 mg/dL (ref 0.44–1.00)
Creatinine, Ser: 0.56 mg/dL (ref 0.44–1.00)
GFR calc Af Amer: >60 mL/min (ref >60)
GFR calc Af Amer: >60 mL/min (ref >60)
GFR calc non Af Amer: >60 mL/min (ref >60)
GFR calc non Af Amer: >60 mL/min (ref >60)
Glucose, Bld: 131 mg/dL — ABNORMAL HIGH (ref 70–99)
Glucose, Bld: 127 mg/dL — ABNORMAL HIGH (ref 70–99)
Potassium: 4 mmol/L (ref 3.5–5.1)
Potassium: 4.3 mmol/L (ref 3.5–5.1)
Sodium: 138 mmol/L (ref 135–145)
Sodium: 135 mmol/L (ref 135–145)

## 2020-02-11 LAB — CBC
HCT: 28.8 % — ABNORMAL LOW (ref 36.0–46.0)
HCT: 25.7 % — ABNORMAL LOW (ref 36.0–46.0)
Hemoglobin: 9.4 g/dL — ABNORMAL LOW (ref 12.0–15.0)
Hemoglobin: 8.5 g/dL — ABNORMAL LOW (ref 12.0–15.0)
MCH: 29.8 pg (ref 26.0–34.0)
MCH: 30.4 pg (ref 26.0–34.0)
MCHC: 32.6 g/dL (ref 30.0–36.0)
MCHC: 33.1 g/dL (ref 30.0–36.0)
MCV: 91.4 fL (ref 80.0–100.0)
MCV: 91.8 fL (ref 80.0–100.0)
Platelets: 115 10*3/uL — ABNORMAL LOW (ref 150–400)
Platelets: 110 10*3/uL — ABNORMAL LOW (ref 150–400)
RBC: 3.15 MIL/uL — ABNORMAL LOW (ref 3.87–5.11)
RBC: 2.8 MIL/uL — ABNORMAL LOW (ref 3.87–5.11)
RDW: 14 % (ref 11.5–15.5)
RDW: 14.4 % (ref 11.5–15.5)
WBC: 12.2 10*3/uL — ABNORMAL HIGH (ref 4.0–10.5)
WBC: 14 10*3/uL — ABNORMAL HIGH (ref 4.0–10.5)
nRBC: 0 % (ref 0.0–0.2)
nRBC: 0 % (ref 0.0–0.2)

## 2020-02-11 LAB — GLUCOSE, CAPILLARY
Glucose-Capillary: 118 mg/dL — ABNORMAL HIGH (ref 70–99)
Glucose-Capillary: 135 mg/dL — ABNORMAL HIGH (ref 70–99)
Glucose-Capillary: 121 mg/dL — ABNORMAL HIGH (ref 70–99)
Glucose-Capillary: 127 mg/dL — ABNORMAL HIGH (ref 70–99)
Glucose-Capillary: 128 mg/dL — ABNORMAL HIGH (ref 70–99)

## 2020-02-11 LAB — MAGNESIUM
Magnesium: 2.3 mg/dL (ref 1.7–2.4)
Magnesium: 2.3 mg/dL (ref 1.7–2.4)

## 2020-02-11 MED ORDER — METOCLOPRAMIDE HCL 5 MG/ML IJ SOLN
10.0000 mg | Freq: Four times a day (QID) | INTRAMUSCULAR | Status: AC
  Administered 2020-02-11 (×4): 10 mg via INTRAVENOUS
  Filled 2020-02-11 (×4): qty 2

## 2020-02-11 MED ORDER — ENOXAPARIN SODIUM 30 MG/0.3ML ~~LOC~~ SOLN
30.0000 mg | Freq: Every day | SUBCUTANEOUS | Status: DC
  Administered 2020-02-12 – 2020-02-15 (×4): 30 mg via SUBCUTANEOUS
  Filled 2020-02-11 (×4): qty 0.3

## 2020-02-11 MED ORDER — ALBUMIN HUMAN 5 % IV SOLN
INTRAVENOUS | Status: AC
  Filled 2020-02-11: qty 250

## 2020-02-11 MED ORDER — KETOROLAC TROMETHAMINE 15 MG/ML IJ SOLN
15.0000 mg | Freq: Four times a day (QID) | INTRAMUSCULAR | Status: AC
  Administered 2020-02-11 – 2020-02-12 (×5): 15 mg via INTRAVENOUS
  Filled 2020-02-11 (×5): qty 1

## 2020-02-11 MED ORDER — MELATONIN 5 MG PO TABS
5.0000 mg | ORAL_TABLET | Freq: Every evening | ORAL | Status: DC | PRN
  Administered 2020-02-12 – 2020-02-15 (×3): 5 mg via ORAL
  Filled 2020-02-11 (×3): qty 1

## 2020-02-11 MED ORDER — WARFARIN - PHYSICIAN DOSING INPATIENT: Freq: Every day | Status: DC

## 2020-02-11 MED ORDER — PREDNISOLONE ACETATE 1 % OP SUSP
1.0000 [drp] | Freq: Three times a day (TID) | OPHTHALMIC | Status: DC
  Administered 2020-02-11 – 2020-02-16 (×14): 1 [drp] via OPHTHALMIC
  Filled 2020-02-11: qty 5

## 2020-02-11 MED ORDER — WARFARIN SODIUM 2.5 MG PO TABS
2.5000 mg | ORAL_TABLET | Freq: Every day | ORAL | Status: DC
  Administered 2020-02-11 – 2020-02-13 (×3): 2.5 mg via ORAL
  Filled 2020-02-11 (×3): qty 1

## 2020-02-11 MED ORDER — GABAPENTIN 600 MG PO TABS
600.0000 mg | ORAL_TABLET | Freq: Two times a day (BID) | ORAL | Status: DC
  Administered 2020-02-12: 600 mg via ORAL
  Filled 2020-02-11: qty 1

## 2020-02-11 MED ORDER — ASPIRIN EC 81 MG PO TBEC
81.0000 mg | DELAYED_RELEASE_TABLET | Freq: Every day | ORAL | Status: DC
  Administered 2020-02-12 – 2020-02-16 (×5): 81 mg via ORAL
  Filled 2020-02-11 (×5): qty 1

## 2020-02-11 MED ORDER — PHENYLEPHRINE HCL-NACL 10-0.9 MG/250ML-% IV SOLN
0.0000 ug/min | INTRAVENOUS | Status: DC
  Administered 2020-02-11: 30 ug/min via INTRAVENOUS
  Administered 2020-02-11: 20 ug/min via INTRAVENOUS
  Filled 2020-02-11 (×2): qty 250

## 2020-02-11 MED ORDER — INSULIN ASPART 100 UNIT/ML ~~LOC~~ SOLN
0.0000 [IU] | SUBCUTANEOUS | Status: DC
  Administered 2020-02-11 (×2): 2 [IU] via SUBCUTANEOUS

## 2020-02-11 NOTE — Progress Notes (Signed)
      [address]       [place] [address]             [phone]        CARDIOTHORACIC SURGERY PROGRESS NOTE   R1 Day Post-Op Procedure(s) (LRB): MINIMALLY INVASIVE MITRAL VALVE REPAIR (MVR) using Memo 4D 32 MM Mitral Valve Ring. (Right) TRANSESOPHAGEAL ECHOCARDIOGRAM (TEE) (N/A)  Subjective: Looks good.  Mild dizziness and nausea.  No SOB.  Mild pain  Objective: Vital signs: BP Readings from Last 1 Encounters:  02/10/20 (!) 86/66   Pulse Readings from Last 1 Encounters:  02/11/20 76   Resp Readings from Last 1 Encounters:  02/11/20 13   Temp Readings from Last 1 Encounters:  02/11/20 97.7 F (36.5 C)    Hemodynamics: PAP: (20-42)/(1-26) 36/16 CO:  [2.5 L/min-6.5 L/min] 3.7 L/min CI:  [1.5 L/min/m2-2.5 L/min/m2] 2.2 L/min/m2  Physical Exam:  Rhythm:   sinus  Breath sounds: clear  Heart sounds:  RRR w/out murmur  Incisions:  Dressing dry, intact  Abdomen:  Soft, non-distended, non-tender  Extremities:  Warm, well-perfused  Chest tubes:  low volume thin serosanguinous output, no air leak    Intake/Output from previous day: 05/14 0701 - 05/15 0700 In: 4904.3 [I.V.:3521.8; Blood:360; IV Piggyback:1022.4] Out: [id number] [Urine:5500; Chest Tube:560] Intake/Output this shift: No intake/output data recorded.  Lab Results:  CBC: Recent Labs    02/10/20 2056 02/11/20 0458  WBC 11.3* 12.2*  HGB 9.9* 9.4*  HCT 30.4* 28.8*  PLT 120* 115*    BMET:  Recent Labs    02/10/20 2056 02/11/20 0458  NA 136 138  K 4.2 4.0  CL 108 106  CO2 19* 22  GLUCOSE 135* 131*  BUN 7* 7*  CREATININE 0.53 0.54  CALCIUM 7.3* 7.8*     PT/INR:   Recent Labs    02/10/20 1448  LABPROT 16.5*  INR 1.4*    CBG (last 3)  Recent Labs    02/10/20 1941 02/10/20 2341 02/11/20 0324  GLUCAP 84 123* 118*    ABG    Component Value Date/Time   PHART 7.353 02/10/2020 1503   PCO2ART 46.1 02/10/2020 1503   PO2ART 270 (H) 02/10/2020 1503   HCO3 26.0  02/10/2020 1503   TCO2 27 02/10/2020 1503   ACIDBASEDEF 1.0 02/10/2020 1310   O2SAT 100.0 02/10/2020 1503    CXR: Clear, mild opacity right lung  EKG: NSR w/out acute ischemic changes    Assessment/Plan: S/P Procedure(s) (LRB): MINIMALLY INVASIVE MITRAL VALVE REPAIR (MVR) using Memo 4D 32 MM Mitral Valve Ring. (Right) TRANSESOPHAGEAL ECHOCARDIOGRAM (TEE) (N/A)  Doing well POD1 Maintaining NSR w/ marginal BP, excellent cardiac output and low PA pressures, no drips Breathing comfortably w/ O2 sats 98% and CXR clear Expected post op volume excess, weight reportedly only 3 kg > preop, UOP adequate Expected post op acute blood loss anemia, very mild Post op thrombocytopenia, very mild   Restart flecainide  D/C Swan and Aline  Mobilize  Hold diuretics for now  Start Coumadin   Rexene Alberts, MD 02/11/2020 7:16 AM

## 2020-02-11 NOTE — Plan of Care (Signed)
  Problem: Education: Goal: Knowledge of General Education information will improve Description: Including pain rating scale, medication(s)/side effects and non-pharmacologic comfort measures Outcome: Progressing   Problem: Clinical Measurements: Goal: Diagnostic test results will improve Outcome: Progressing Goal: Respiratory complications will improve Outcome: Progressing   Problem: Elimination: Goal: Will not experience complications related to urinary retention Outcome: Progressing   Problem: Pain Managment: Goal: General experience of comfort will improve Outcome: Progressing

## 2020-02-11 NOTE — Progress Notes (Signed)
TCTS BRIEF SICU PROGRESS NOTE  1 Day Post-Op  S/P Procedure(s) (LRB): MINIMALLY INVASIVE MITRAL VALVE REPAIR (MVR) using Memo 4D 32 MM Mitral Valve Ring. (Right) TRANSESOPHAGEAL ECHOCARDIOGRAM (TEE) (N/A)   Stable day Still having some nausea NSR w/ stable BP Breathing comfortably w/ O2 sats 95% on room air UOP adequate  Plan: Continue current plan  Rexene Alberts, MD 02/11/2020 8:37 PM

## 2020-02-12 ENCOUNTER — Inpatient Hospital Stay (HOSPITAL_COMMUNITY): Payer: PPO

## 2020-02-12 LAB — BASIC METABOLIC PANEL
Anion gap: 8 (ref 5–15)
BUN: 10 mg/dL (ref 8–23)
CO2: 21 mmol/L — ABNORMAL LOW (ref 22–32)
Calcium: 8.5 mg/dL — ABNORMAL LOW (ref 8.9–10.3)
Chloride: 104 mmol/L (ref 98–111)
Creatinine, Ser: 0.66 mg/dL (ref 0.44–1.00)
GFR calc Af Amer: >60 mL/min (ref >60)
GFR calc non Af Amer: >60 mL/min (ref >60)
Glucose, Bld: 115 mg/dL — ABNORMAL HIGH (ref 70–99)
Potassium: 3.5 mmol/L (ref 3.5–5.1)
Sodium: 133 mmol/L — ABNORMAL LOW (ref 135–145)

## 2020-02-12 LAB — CBC
HCT: 26.5 % — ABNORMAL LOW (ref 36.0–46.0)
Hemoglobin: 8.8 g/dL — ABNORMAL LOW (ref 12.0–15.0)
MCH: 30.4 pg (ref 26.0–34.0)
MCHC: 33.2 g/dL (ref 30.0–36.0)
MCV: 91.7 fL (ref 80.0–100.0)
Platelets: 97 10*3/uL — ABNORMAL LOW (ref 150–400)
RBC: 2.89 MIL/uL — ABNORMAL LOW (ref 3.87–5.11)
RDW: 14.6 % (ref 11.5–15.5)
WBC: 12.6 10*3/uL — ABNORMAL HIGH (ref 4.0–10.5)
nRBC: 0 % (ref 0.0–0.2)

## 2020-02-12 LAB — GLUCOSE, CAPILLARY
Glucose-Capillary: 105 mg/dL — ABNORMAL HIGH (ref 70–99)
Glucose-Capillary: 115 mg/dL — ABNORMAL HIGH (ref 70–99)
Glucose-Capillary: 123 mg/dL — ABNORMAL HIGH (ref 70–99)

## 2020-02-12 LAB — PROTIME-INR
INR: 1.3 — ABNORMAL HIGH (ref 0.8–1.2)
Prothrombin Time: 15.6 seconds — ABNORMAL HIGH (ref 11.4–15.2)

## 2020-02-12 MED ORDER — PROMETHAZINE HCL 25 MG/ML IJ SOLN
6.2500 mg | Freq: Four times a day (QID) | INTRAMUSCULAR | Status: DC | PRN
  Administered 2020-02-12: 6.25 mg via INTRAVENOUS
  Filled 2020-02-12: qty 1

## 2020-02-12 MED ORDER — ~~LOC~~ CARDIAC SURGERY, PATIENT & FAMILY EDUCATION: Freq: Once | Status: AC

## 2020-02-12 MED ORDER — METOPROLOL TARTRATE 12.5 MG HALF TABLET
12.5000 mg | ORAL_TABLET | Freq: Two times a day (BID) | ORAL | Status: DC
  Administered 2020-02-12: 12.5 mg via ORAL
  Filled 2020-02-12: qty 1

## 2020-02-12 MED ORDER — POTASSIUM CHLORIDE 10 MEQ/50ML IV SOLN
10.0000 meq | INTRAVENOUS | Status: AC
  Administered 2020-02-12 (×5): 10 meq via INTRAVENOUS
  Filled 2020-02-12 (×5): qty 50

## 2020-02-12 MED ORDER — FUROSEMIDE 10 MG/ML IJ SOLN
20.0000 mg | Freq: Two times a day (BID) | INTRAMUSCULAR | Status: DC
  Administered 2020-02-12 – 2020-02-13 (×3): 20 mg via INTRAVENOUS
  Filled 2020-02-12 (×3): qty 2

## 2020-02-12 MED ORDER — FUROSEMIDE 10 MG/ML IJ SOLN
20.0000 mg | Freq: Four times a day (QID) | INTRAMUSCULAR | Status: DC
  Administered 2020-02-12: 20 mg via INTRAVENOUS
  Filled 2020-02-12: qty 2

## 2020-02-12 NOTE — Progress Notes (Signed)
[address]       [place] [address]             [phone]        CARDIOTHORACIC SURGERY PROGRESS NOTE   R2 Days Post-Op Procedure(s) (LRB): MINIMALLY INVASIVE MITRAL VALVE REPAIR (MVR) using Memo 4D 32 MM Mitral Valve Ring. (Right) TRANSESOPHAGEAL ECHOCARDIOGRAM (TEE) (N/A)  Subjective: Looks good.  Still having some nausea, otherwise feeling better.  Objective: Vital signs: BP Readings from Last 1 Encounters:  02/12/20 101/61   Pulse Readings from Last 1 Encounters:  02/12/20 89   Resp Readings from Last 1 Encounters:  02/12/20 16   Temp Readings from Last 1 Encounters:  02/12/20 98.2 F (36.8 C) (Oral)    Hemodynamics: PAP: (36-42)/(13-20) 39/16  Physical Exam:  Rhythm:   sinus  Breath sounds: clear  Heart sounds:  RRR w/out murmur  Incisions:  Dressing dry, intact  Abdomen:  Soft, non-distended, non-tender  Extremities:  Warm, well-perfused  Chest tubes:  decreasing volume thin serosanguinous output, no air leak    Intake/Output from previous day: 05/15 0701 - 05/16 0700 In: 701.1 [I.V.:417.9; IV Piggyback:283.2] Out: 1187 [Urine:707; Chest Tube:480] Intake/Output this shift: Total I/O In: -  Out: 95 [Urine:25; Chest Tube:70]  Lab Results:  CBC: Recent Labs    02/11/20 1658 02/12/20 0322  WBC 14.0* 12.6*  HGB 8.5* 8.8*  HCT 25.7* 26.5*  PLT 110* 97*    BMET:  Recent Labs    02/11/20 1658 02/12/20 0322  NA 135 133*  K 4.3 3.5  CL 105 104  CO2 23 21*  GLUCOSE 127* 115*  BUN 8 10  CREATININE 0.56 0.66  CALCIUM 8.2* 8.5*     PT/INR:   Recent Labs    02/12/20 0322  LABPROT 15.6*  INR 1.3*    CBG (last 3)  Recent Labs    02/12/20 0034 02/12/20 0328 02/12/20 0742  GLUCAP 105* 115* 123*    ABG    Component Value Date/Time   PHART 7.353 02/10/2020 1503   PCO2ART 46.1 02/10/2020 1503   PO2ART 270 (H) 02/10/2020 1503   HCO3 26.0 02/10/2020 1503   TCO2 27 02/10/2020 1503   ACIDBASEDEF 1.0  02/10/2020 1310   O2SAT 100.0 02/10/2020 1503    CXR: PORTABLE CHEST 1 VIEW  COMPARISON:  02/10/2019  FINDINGS: Stable right chest tube. The small right apical pneumothorax persists but appears unchanged. Normal heart size. Interval removal of the pulmonary arterial catheter. The left IJ catheter sheath remains in place. Atelectasis noted along the course of the right chest tube. Chronic bronchitic changes are identified with superimposed pulmonary vascular congestion.  IMPRESSION: 1. Stable small right apical pneumothorax. 2. Right chest tube in place with atelectasis along the course of the right chest tube. 3. Pulmonary vascular congestion.   Electronically Signed   By: Kerby Moors M.D.   On: 02/12/2020 08:39   Assessment/Plan: S/P Procedure(s) (LRB): MINIMALLY INVASIVE MITRAL VALVE REPAIR (MVR) using Memo 4D 32 MM Mitral Valve Ring. (Right) TRANSESOPHAGEAL ECHOCARDIOGRAM (TEE) (N/A)  Doing well POD2 Maintaining NSR w/ stable BP Breathing comfortably w/ O2 sats 92% on room air and CXR clear Expected post op volume excess, weight not recorded today, UOP adequate Expected post op acute blood loss anemia, mild Post op thrombocytopenia, mild Expected post op atelectasis, mild Post op nausea w/out emesis   Hold most oral meds and keep on clear liquids for now  Mobilize  Gentle diuresis  Coumadin  Rexene Alberts, MD 02/12/2020 8:54 AM

## 2020-02-12 NOTE — Plan of Care (Signed)
  Problem: Clinical Measurements: Goal: Diagnostic test results will improve Outcome: Progressing Goal: Respiratory complications will improve Outcome: Progressing Goal: Cardiovascular complication will be avoided Outcome: Progressing   Problem: Pain Managment: Goal: General experience of comfort will improve Outcome: Progressing   Problem: Nutrition: Goal: Adequate nutrition will be maintained Outcome: Not Progressing Note: Pt continuing to have nausea despite medication

## 2020-02-12 NOTE — Progress Notes (Signed)
TCTS BRIEF SICU PROGRESS NOTE  2 Days Post-Op  S/P Procedure(s) (LRB): MINIMALLY INVASIVE MITRAL VALVE REPAIR (MVR) using Memo 4D 32 MM Mitral Valve Ring. (Right) TRANSESOPHAGEAL ECHOCARDIOGRAM (TEE) (N/A)   Feels a little better w/ less nausea NSR/sinus tach w/ stable BP Breathing comfortably on room air Diuresing some  Plan: Continue current plan.  Will start low dose beta blocker  Rexene Alberts, MD 02/12/2020 5:58 PM

## 2020-02-13 ENCOUNTER — Encounter: Payer: Self-pay | Admitting: *Deleted

## 2020-02-13 ENCOUNTER — Other Ambulatory Visit: Payer: Self-pay

## 2020-02-13 ENCOUNTER — Inpatient Hospital Stay (HOSPITAL_COMMUNITY): Payer: PPO

## 2020-02-13 DIAGNOSIS — I340 Nonrheumatic mitral (valve) insufficiency: Secondary | ICD-10-CM

## 2020-02-13 DIAGNOSIS — Z9889 Other specified postprocedural states: Secondary | ICD-10-CM

## 2020-02-13 LAB — BASIC METABOLIC PANEL
Anion gap: 12 (ref 5–15)
BUN: 10 mg/dL (ref 8–23)
CO2: 22 mmol/L (ref 22–32)
Calcium: 8.4 mg/dL — ABNORMAL LOW (ref 8.9–10.3)
Chloride: 100 mmol/L (ref 98–111)
Creatinine, Ser: 0.6 mg/dL (ref 0.44–1.00)
GFR calc Af Amer: >60 mL/min (ref >60)
GFR calc non Af Amer: >60 mL/min (ref >60)
Glucose, Bld: 94 mg/dL (ref 70–99)
Potassium: 3.6 mmol/L (ref 3.5–5.1)
Sodium: 134 mmol/L — ABNORMAL LOW (ref 135–145)

## 2020-02-13 LAB — CBC
HCT: 28.4 % — ABNORMAL LOW (ref 36.0–46.0)
Hemoglobin: 9.5 g/dL — ABNORMAL LOW (ref 12.0–15.0)
MCH: 30.4 pg (ref 26.0–34.0)
MCHC: 33.5 g/dL (ref 30.0–36.0)
MCV: 91 fL (ref 80.0–100.0)
Platelets: 101 10*3/uL — ABNORMAL LOW (ref 150–400)
RBC: 3.12 MIL/uL — ABNORMAL LOW (ref 3.87–5.11)
RDW: 14.3 % (ref 11.5–15.5)
WBC: 11.7 10*3/uL — ABNORMAL HIGH (ref 4.0–10.5)
nRBC: 0.3 % — ABNORMAL HIGH (ref 0.0–0.2)

## 2020-02-13 LAB — PROTIME-INR
INR: 1.6 — ABNORMAL HIGH (ref 0.8–1.2)
Prothrombin Time: 18.4 seconds — ABNORMAL HIGH (ref 11.4–15.2)

## 2020-02-13 MED ORDER — AMIODARONE HCL IN DEXTROSE 360-4.14 MG/200ML-% IV SOLN
60.0000 mg/h | INTRAVENOUS | Status: DC
  Administered 2020-02-13 – 2020-02-14 (×2): 60 mg/h via INTRAVENOUS
  Filled 2020-02-13 (×2): qty 200

## 2020-02-13 MED ORDER — AMIODARONE LOAD VIA INFUSION
150.0000 mg | Freq: Once | INTRAVENOUS | Status: AC
  Administered 2020-02-13: 150 mg via INTRAVENOUS
  Filled 2020-02-13: qty 83.34

## 2020-02-13 MED ORDER — POTASSIUM CHLORIDE 10 MEQ/50ML IV SOLN
10.0000 meq | INTRAVENOUS | Status: AC
  Administered 2020-02-13 (×3): 10 meq via INTRAVENOUS
  Filled 2020-02-13 (×4): qty 50

## 2020-02-13 MED ORDER — FLECAINIDE ACETATE 50 MG PO TABS
75.0000 mg | ORAL_TABLET | Freq: Two times a day (BID) | ORAL | Status: DC
  Administered 2020-02-13 (×2): 75 mg via ORAL
  Filled 2020-02-13 (×3): qty 2

## 2020-02-13 MED ORDER — AMIODARONE HCL IN DEXTROSE 360-4.14 MG/200ML-% IV SOLN
30.0000 mg/h | INTRAVENOUS | Status: DC
  Administered 2020-02-14: 30 mg/h via INTRAVENOUS

## 2020-02-13 MED ORDER — GABAPENTIN 600 MG PO TABS
600.0000 mg | ORAL_TABLET | Freq: Two times a day (BID) | ORAL | Status: DC
  Administered 2020-02-13 – 2020-02-16 (×7): 600 mg via ORAL
  Filled 2020-02-13 (×7): qty 1

## 2020-02-13 MED ORDER — METOPROLOL TARTRATE 25 MG PO TABS
25.0000 mg | ORAL_TABLET | Freq: Two times a day (BID) | ORAL | Status: DC
  Administered 2020-02-13 – 2020-02-16 (×4): 25 mg via ORAL
  Filled 2020-02-13 (×7): qty 1

## 2020-02-13 NOTE — Plan of Care (Signed)

## 2020-02-13 NOTE — Progress Notes (Signed)
Pt's heart rhythm went into Afib with rates between 100-130.  Vital signs were stable with no complaints of distress or shortness of breathing.  EKG performed.  [place] notified.  Amio started.  Will continue to monitor closely.

## 2020-02-13 NOTE — Addendum Note (Signed)
Addendum  created 02/13/20 1436 by Sammie Bench, CRNA   Order list changed

## 2020-02-13 NOTE — Discharge Instructions (Addendum)
Discharge Instructions:  1. You may shower, please wash incisions daily with soap and water and keep dry.  If you wish to cover wounds with dressing you may do so but please keep clean and change daily.  No tub baths or swimming until incisions have completely healed.  If your incisions become red or develop any drainage please call our office at [phone]  2. No Driving until cleared by [facility]'s office and you are no longer using narcotic pain medications  3. Monitor your weight daily.. Please use the same scale and weigh at same time... If you gain 5-10 lbs in 48 hours with associated lower extremity swelling, please contact our office at [phone]  4. Fever of 101.5 for at least 24 hours with no source, please contact our office at [phone]  5. Activity- up as tolerated, please walk at least 3 times per day.  Avoid strenuous activity, no lifting, pushing, or pulling with your arms over 8-10 lbs for a minimum of 6 weeks  6. If any questions or concerns arise, please do not hesitate to contact our office at [phone]    Information on my medicine - Coumadin   (Warfarin)  Why was Coumadin prescribed for you? Coumadin was prescribed for you because you have a blood clot or a medical condition that can cause an increased risk of forming blood clots. Blood clots can cause serious health problems by blocking the flow of blood to the heart, lung, or brain. Coumadin can prevent harmful blood clots from forming. As a reminder your indication for Coumadin is:   Blood Clot Prevention After Heart Valve Surgery  What test will check on my response to Coumadin? While on Coumadin (warfarin) you will need to have an INR test regularly to ensure that your dose is keeping you in the desired range. The INR (international normalized ratio) number is calculated from the result of the laboratory test called prothrombin time (PT).  If an INR APPOINTMENT HAS NOT ALREADY BEEN MADE FOR YOU please  schedule an appointment to have this lab work done by your health care provider within 7 days. Your INR goal is usually a number between:  2 to 3 or your provider may give you a more narrow range like 2-2.5.  Ask your health care provider during an office visit what your goal INR is.  What  do you need to  know  About  COUMADIN? Take Coumadin (warfarin) exactly as prescribed by your healthcare provider about the same time each day.  DO NOT stop taking without talking to the doctor who prescribed the medication.  Stopping without other blood clot prevention medication to take the place of Coumadin may increase your risk of developing a new clot or stroke.  Get refills before you run out.  What do you do if you miss a dose? If you miss a dose, take it as soon as you remember on the same day then continue your regularly scheduled regimen the next day.  Do not take two doses of Coumadin at the same time.  Important Safety Information A possible side effect of Coumadin (Warfarin) is an increased risk of bleeding. You should call your healthcare provider right away if you experience any of the following: ? Bleeding from an injury or your nose that does not stop. ? Unusual colored urine (red or dark brown) or unusual colored stools (red or black). ? Unusual bruising for unknown reasons. ? A serious fall or if you hit your head (even  if there is no bleeding).  Some foods or medicines interact with Coumadin (warfarin) and might alter your response to warfarin. To help avoid this: ? Eat a balanced diet, maintaining a consistent amount of Vitamin K. ? Notify your provider about major diet changes you plan to make. ? Avoid alcohol or limit your intake to 1 drink for women and 2 drinks for men per day. (1 drink is 5 oz. wine, 12 oz. beer, or 1.5 oz. liquor.)  Make sure that ANY health care provider who prescribes medication for you knows that you are taking Coumadin (warfarin).  Also make sure the  healthcare provider who is monitoring your Coumadin knows when you have started a new medication including herbals and non-prescription products.  Coumadin (Warfarin)  Major Drug Interactions  Increased Warfarin Effect Decreased Warfarin Effect  Alcohol (large quantities) Antibiotics (esp. Septra/Bactrim, Flagyl, Cipro) Amiodarone (Cordarone) Aspirin (ASA) Cimetidine (Tagamet) Megestrol (Megace) NSAIDs (ibuprofen, naproxen, etc.) Piroxicam (Feldene) Propafenone (Rythmol SR) Propranolol (Inderal) Isoniazid (INH) Posaconazole (Noxafil) Barbiturates (Phenobarbital) Carbamazepine (Tegretol) Chlordiazepoxide (Librium) Cholestyramine (Questran) Griseofulvin Oral Contraceptives Rifampin Sucralfate (Carafate) Vitamin K   Coumadin (Warfarin) Major Herbal Interactions  Increased Warfarin Effect Decreased Warfarin Effect  Garlic Ginseng Ginkgo biloba Coenzyme Q10 Green tea St. John's wort    Coumadin (Warfarin) FOOD Interactions  Eat a consistent number of servings per week of foods HIGH in Vitamin K (1 serving =  cup)  Collards (cooked, or boiled & drained) Kale (cooked, or boiled & drained) Mustard greens (cooked, or boiled & drained) Parsley *serving size only =  cup Spinach (cooked, or boiled & drained) Swiss chard (cooked, or boiled & drained) Turnip greens (cooked, or boiled & drained)  Eat a consistent number of servings per week of foods MEDIUM-HIGH in Vitamin K (1 serving = 1 cup)  Asparagus (cooked, or boiled & drained) Broccoli (cooked, boiled & drained, or raw & chopped) Brussel sprouts (cooked, or boiled & drained) *serving size only =  cup Lettuce, raw (green leaf, endive, romaine) Spinach, raw Turnip greens, raw & chopped   These websites have more information on Coumadin (warfarin):  FailFactory.se; VeganReport.com.au;

## 2020-02-13 NOTE — Progress Notes (Signed)
Pt ambulated approx 400' with rolling walker and standby assist.  Tolerated well, stopped to rest one time. HR 115-120, SpO2 94% on RA.  Assisted back to bed, chest tube to wall suction, call bell within reach and family at bedside.

## 2020-02-13 NOTE — Progress Notes (Signed)
  Amiodarone Drug - Drug Interaction Consult Note  Recommendations: - Will interfere with warfarin and require a dose reduction - pharmacy will monitor peripherally and make recommendations to CVTS as warranted  - Will require monitoring for QTc prolongation with concurrent flecainide - from today QTc noted to be 465  - Monitor for increased of bradycardia with concurrent metoprolol - Actively replete potassium while on diuretics to avoid risk of hypokalemia and cardiac toxicity  Amiodarone is metabolized by the cytochrome P450 system and therefore has the potential to cause many drug interactions. Amiodarone has an average plasma half-life of 50 days (range 20 to 100 days).   There is potential for drug interactions to occur several weeks or months after stopping treatment and the onset of drug interactions may be slow after initiating amiodarone.   []  Statins: Increased risk of myopathy. Simvastatin- restrict dose to 20mg  daily. Other statins: counsel patients to report any muscle pain or weakness immediately.  [x]  Anticoagulants: Amiodarone can increase anticoagulant effect. Consider warfarin dose reduction. Patients should be monitored closely and the dose of anticoagulant altered accordingly, remembering that amiodarone levels take several weeks to stabilize.  []  Antiepileptics: Amiodarone can increase plasma concentration of phenytoin, the dose should be reduced. Note that small changes in phenytoin dose can result in large changes in levels. Monitor patient and counsel on signs of toxicity.  [x]  Beta blockers: increased risk of bradycardia, AV block and myocardial depression. Sotalol - avoid concomitant use.  []   Calcium channel blockers (diltiazem and verapamil): increased risk of bradycardia, AV block and myocardial depression.  []   Cyclosporine: Amiodarone increases levels of cyclosporine. Reduced dose of cyclosporine is recommended.  []  Digoxin dose should be halved when  amiodarone is started.  [x]  Diuretics: increased risk of cardiotoxicity if hypokalemia occurs.  []  Oral hypoglycemic agents (glyburide, glipizide, glimepiride): increased risk of hypoglycemia. Patient's glucose levels should be monitored closely when initiating amiodarone therapy.   [x]  Drugs that prolong the QT interval:  Torsades de pointes risk may be increased with concurrent use - avoid if possible.  Monitor QTc, also keep magnesium/potassium WNL if concurrent therapy can't be avoided. [name] Antibiotics: e.g. fluoroquinolones, erythromycin. . Antiarrhythmics: e.g. quinidine, procainamide, disopyramide, sotalol. . Antipsychotics: e.g. phenothiazines, haloperidol.  . Lithium, tricyclic antidepressants, and methadone.  Thank You,  Lawson Radar   02/13/2020 8:53 PM

## 2020-02-13 NOTE — Progress Notes (Signed)
CARDIAC REHAB PHASE I   PRE:  Rate/Rhythm: 94 SR  BP:  Supine: 117/74  Sitting:   Standing:    SaO2: 93%RA  MODE:  Ambulation: 190 ft   POST:  Rate/Rhythm: 96 SR  BP:  Supine:   Sitting: 113/74  Standing:    SaO2: 95%RA 1400-1448 Pt walked 190 ft on RA with rolling walker and asst x 1. Tolerated well. No c/o nausea. To bathroom and then to chair. Chest tube intact. Encouraged IS. Pt demonstrated 750 ml x 3 with assistance. Encouraged 10x an hour and at least one more walk today.   Graylon Good, RN BSN  02/13/2020 2:45 PM

## 2020-02-13 NOTE — Progress Notes (Addendum)
TCTS DAILY ICU PROGRESS NOTE                   [address]ellie [address]rey            ,[place] [id number]          [phone]   3 Days Post-Op Procedure(s) (LRB): MINIMALLY INVASIVE MITRAL VALVE REPAIR (MVR) using Memo 4D 32 MM Mitral Valve Ring. (Right) TRANSESOPHAGEAL ECHOCARDIOGRAM (TEE) (N/A)  Total Length of Stay:  LOS: 3 days   Subjective: Up in the bedside chair, having clear liquids for breakfast.  Pain controlled. No new concerns. Passing gas, no BM yet.     Objective: Vital signs in last 24 hours: Temp:  [98.2 F (36.8 C)-98.5 F (36.9 C)] 98.3 F (36.8 C) (05/17 0726) Pulse Rate:  [86-119] 101 (05/17 0600) Cardiac Rhythm: Normal sinus rhythm (05/17 0400) Resp:  [14-24] 21 (05/17 0600) BP: (93-118)/(58-88) 118/78 (05/17 0600) SpO2:  [87 %-98 %] 91 % (05/17 0600) Weight:  [64.3 kg] 64.3 kg (05/17 0600)  Filed Weights   02/10/20 0600 02/11/20 0500 02/13/20 0600  Weight: 61.2 kg 65.2 kg 64.3 kg    Weight change:    Hemodynamic parameters for last 24 hours:    Intake/Output from previous day: 05/16 0701 - 05/17 0700 In: 487.2 [P.O.:320; IV Piggyback:167.2] Out: 2735 [Urine:2105; Chest Tube:630]  Intake/Output this shift: No intake/output data recorded.  Current Meds: Scheduled Meds: . acetaminophen  1,000 mg Oral Q6H  . aspirin EC  81 mg Oral Daily  . bisacodyl  10 mg Oral Daily   Or  . bisacodyl  10 mg Rectal Daily  . Chlorhexidine Gluconate Cloth  6 each Topical Daily  . docusate sodium  200 mg Oral Daily  . enoxaparin (LOVENOX) injection  30 mg Subcutaneous QHS  . flecainide  75 mg Oral BID  . furosemide  20 mg Intravenous BID  . gabapentin  600 mg Oral BID  . levothyroxine  50 mcg Oral QAC breakfast  . metoprolol tartrate  25 mg Oral BID  . pantoprazole  40 mg Oral Daily  . prednisoLONE acetate  1 drop Left Eye Q8H  . sodium chloride flush  10-40 mL Intracatheter Q12H  . sodium chloride flush  3 mL Intravenous Q12H  . warfarin  2.5 mg  Oral q1600  . Warfarin - Physician Dosing Inpatient   Does not apply q1600   Continuous Infusions: . sodium chloride    . lactated ringers Stopped (02/10/20 1525)  . lactated ringers Stopped (02/11/20 2005)  . potassium chloride 50 mL/hr at 02/13/20 0700   PRN Meds:.melatonin, metoprolol tartrate, morphine injection, ondansetron (ZOFRAN) IV, oxyCODONE, promethazine, sodium chloride flush, sodium chloride flush, traMADol  General appearance: alert, cooperative and no distress Neurologic: intact Heart: Regular rhythm, no murmur. Monitor shows S.Tach Lungs: Breath sounds are clear, CT drainage ~ 666ml past 24 hours. Drainage is thin serosanguinous. Abdomen: NT Extremities: Well perfused, no significant edema.  Wound: the chest incision is covered with a dry Aquacel dressing. Expected periincisional bruising.   Lab Results: CBC: Recent Labs    02/12/20 0322 02/13/20 0523  WBC 12.6* 11.7*  HGB 8.8* 9.5*  HCT 26.5* 28.4*  PLT 97* 101*   BMET:  Recent Labs    02/12/20 0322 02/13/20 0523  NA 133* 134*  K 3.5 3.6  CL 104 100  CO2 21* 22  GLUCOSE 115* 94  BUN 10 10  CREATININE 0.66 0.60  CALCIUM 8.5* 8.4*    CMET:  Lab Results  Component Value Date   WBC 11.7 (H) 02/13/2020   HGB 9.5 (L) 02/13/2020   HCT 28.4 (L) 02/13/2020   PLT 101 (L) 02/13/2020   GLUCOSE 94 02/13/2020   ALT 17 02/08/2020   AST 22 02/08/2020   NA 134 (L) 02/13/2020   K 3.6 02/13/2020   CL 100 02/13/2020   CREATININE 0.60 02/13/2020   BUN 10 02/13/2020   CO2 22 02/13/2020   TSH 1.190 06/09/2018   INR 1.6 (H) 02/13/2020   HGBA1C 5.6 02/08/2020      PT/INR:  Recent Labs    02/13/20 0523  LABPROT 18.4*  INR 1.6*   Radiology: No results found.   Assessment/Plan: S/P Procedure(s) (LRB): MINIMALLY INVASIVE MITRAL VALVE REPAIR (MVR) using Memo 4D 32 MM Mitral Valve Ring. (Right) TRANSESOPHAGEAL ECHOCARDIOGRAM (TEE) (N/A)  -POD-3 MINI MV repair with a 51mm annuloplasty  ring. Progressing well. Will transfer to 4E today. Advance diet and activity. Continue Coumadin at 2.5mg  daily, INR 1.6. D/C cordis and pacer wires but leave chest tubes for now.   -Post-op volume excess- Wt ~3kg + from pre-op. Continue Lasix 20mg  IV BID today.   -EBL anemia- Hct stable, monitor.   -Thrombocytopenia- Plt count recovering.   -DVT PPX- continue daily [place] Lovenox until INR therapeutic.   -History of frequent PVC's and PAC's- resume flecainide.   -History of hypothyroidism- levothyroxine resumed.   Antony Odea, PA-C [phone] 02/13/2020 8:04 AM   I have seen and examined the patient and agree with the assessment and plan as outlined.  Rexene Alberts, MD 02/13/2020 8:30 AM

## 2020-02-14 ENCOUNTER — Inpatient Hospital Stay (HOSPITAL_COMMUNITY): Payer: PPO

## 2020-02-14 LAB — CBC
HCT: 26.8 % — ABNORMAL LOW (ref 36.0–46.0)
Hemoglobin: 9 g/dL — ABNORMAL LOW (ref 12.0–15.0)
MCH: 30.2 pg (ref 26.0–34.0)
MCHC: 33.6 g/dL (ref 30.0–36.0)
MCV: 89.9 fL (ref 80.0–100.0)
Platelets: 106 10*3/uL — ABNORMAL LOW (ref 150–400)
RBC: 2.98 MIL/uL — ABNORMAL LOW (ref 3.87–5.11)
RDW: 14.2 % (ref 11.5–15.5)
WBC: 7.3 10*3/uL (ref 4.0–10.5)
nRBC: 0.7 % — ABNORMAL HIGH (ref 0.0–0.2)

## 2020-02-14 LAB — BASIC METABOLIC PANEL
Anion gap: 10 (ref 5–15)
BUN: 12 mg/dL (ref 8–23)
CO2: 27 mmol/L (ref 22–32)
Calcium: 8.5 mg/dL — ABNORMAL LOW (ref 8.9–10.3)
Chloride: 102 mmol/L (ref 98–111)
Creatinine, Ser: 0.63 mg/dL (ref 0.44–1.00)
GFR calc Af Amer: >60 mL/min (ref >60)
GFR calc non Af Amer: >60 mL/min (ref >60)
Glucose, Bld: 108 mg/dL — ABNORMAL HIGH (ref 70–99)
Potassium: 3.2 mmol/L — ABNORMAL LOW (ref 3.5–5.1)
Sodium: 139 mmol/L (ref 135–145)

## 2020-02-14 LAB — PROTIME-INR
INR: 1.8 — ABNORMAL HIGH (ref 0.8–1.2)
Prothrombin Time: 20.3 seconds — ABNORMAL HIGH (ref 11.4–15.2)

## 2020-02-14 MED ORDER — AMIODARONE HCL 200 MG PO TABS
200.0000 mg | ORAL_TABLET | Freq: Two times a day (BID) | ORAL | Status: DC
  Administered 2020-02-14 (×2): 200 mg via ORAL
  Filled 2020-02-14 (×2): qty 1

## 2020-02-14 MED ORDER — AMIODARONE HCL IN DEXTROSE 360-4.14 MG/200ML-% IV SOLN
INTRAVENOUS | Status: AC
  Administered 2020-02-14: 30 mg/h via INTRAVENOUS
  Filled 2020-02-14: qty 200

## 2020-02-14 MED ORDER — POTASSIUM CHLORIDE CRYS ER 20 MEQ PO TBCR
40.0000 meq | EXTENDED_RELEASE_TABLET | Freq: Once | ORAL | Status: AC
  Administered 2020-02-14: 40 meq via ORAL
  Filled 2020-02-14: qty 2

## 2020-02-14 MED ORDER — WARFARIN SODIUM 1 MG PO TABS
1.0000 mg | ORAL_TABLET | Freq: Every day | ORAL | Status: DC
  Administered 2020-02-14 – 2020-02-15 (×2): 1 mg via ORAL
  Filled 2020-02-14 (×2): qty 1

## 2020-02-14 NOTE — Care Management Important Message (Signed)
Important Message  Patient Details  Name: Laikynn Whitty MRN: [id number] Date of Birth: 30-Aug-1946   Medicare Important Message Given:  Yes     Shelda Altes 02/14/2020, 8:32 AM

## 2020-02-14 NOTE — Progress Notes (Addendum)
4 Days Post-Op Procedure(s) (LRB): MINIMALLY INVASIVE MITRAL VALVE REPAIR (MVR) using Memo 4D 32 MM Mitral Valve Ring. (Right) TRANSESOPHAGEAL ECHOCARDIOGRAM (TEE) (N/A) Subjective: Awake and alert with no now problems.  Developed a-fib last night and has converted back to SR on IV amiodarone.   Objective: Vital signs in last 24 hours: Temp:  [97.7 F (36.5 C)-98.5 F (36.9 C)] 98.4 F (36.9 C) (05/18 0332) Pulse Rate:  [72-120] 72 (05/18 0332) Cardiac Rhythm: Normal sinus rhythm;Heart block (05/18 0838) Resp:  [12-22] 16 (05/18 0605) BP: (91-120)/(62-92) 100/67 (05/18 0332) SpO2:  [90 %-99 %] 99 % (05/18 0332) Weight:  [61.4 kg] 61.4 kg (05/18 0605)    Intake/Output from previous day: 05/17 0701 - 05/18 0700 In: 414.3 [P.O.:370; IV Piggyback:44.3] Out: 160 [Chest Tube:160] Intake/Output this shift: No intake/output data recorded.  General appearance: alert, cooperative and no distress Neurologic: intact Heart: Regular rhythm, no murmur. Monitor shows NSR Lungs: Breath sounds are clear, CT drainage ~ 180mll past 24 hours. Drainage is thin serosanguinous. Abdomen: NT Extremities: Well perfused, no significant edema.  Wound: the chest incision is  a dry. Expected periincisional bruising.   Lab Results: Recent Labs    02/13/20 0523 02/14/20 0337  WBC 11.7* 7.3  HGB 9.5* 9.0*  HCT 28.4* 26.8*  PLT 101* 106*   BMET:  Recent Labs    02/13/20 0523 02/14/20 0337  NA 134* 139  K 3.6 3.2*  CL 100 102  CO2 22 27  GLUCOSE 94 108*  BUN 10 12  CREATININE 0.60 0.63  CALCIUM 8.4* 8.5*    PT/INR:  Recent Labs    02/14/20 0337  LABPROT 20.3*  INR 1.8*   ABG    Component Value Date/Time   PHART 7.353 02/10/2020 1503   HCO3 26.0 02/10/2020 1503   TCO2 27 02/10/2020 1503   ACIDBASEDEF 1.0 02/10/2020 1310   O2SAT 100.0 02/10/2020 1503   CBG (last 3)  Recent Labs    02/12/20 0034 02/12/20 0328 02/12/20 0742  GLUCAP 105* 115* 123*    Assessment/Plan: S/P  Procedure(s) (LRB): MINIMALLY INVASIVE MITRAL VALVE REPAIR (MVR) using Memo 4D 32 MM Mitral Valve Ring. (Right) TRANSESOPHAGEAL ECHOCARDIOGRAM (TEE) (N/A)  -POD-4 MINI MV repair with a 23mm annuloplasty ring. Progressing well with diet and actrivity.  Coumadin 1mg  today, INR 1.8. Remove chest tubes today.   -Post-op atrial fibrillation- converted quickly with initiation of IV amiodarone protocol. Will convert to PO amio today.   -Post-op volume excess- Urine output not measured past 24 hours but wt. Is now near pre-op. Does not appear edematous.  Switch to once daily oral Lasix today.  -Hypokalemia-replacement ordered.   -EBL anemia- Hct stable, monitor.   -Thrombocytopenia- Plt count recovering.   -DVT PPX- continue daily [place] Lovenox until INR therapeutic.   -History of frequent PVC's and PAC's- Now in stable SR. Stop flecainide since we are loading with amiodarone.    -History of hypothyroidism- levothyroxine resumed.    LOS: 4 days    Antony Odea, PA-C [phone] 02/14/2020   I have seen and examined the patient and agree with the assessment and plan as outlined.  Rexene Alberts, MD 02/14/2020

## 2020-02-14 NOTE — Plan of Care (Signed)

## 2020-02-14 NOTE — Progress Notes (Signed)
CARDIAC REHAB PHASE I   PRE:  Rate/Rhythm: 89 SR  BP:  Supine:   Sitting: 102/58  Standing:    SaO2: 97%RA  MODE:  Ambulation: 470 ft   POST:  Rate/Rhythm: 112 ST  BP:  Supine:   Sitting: 113/66  Standing:    SaO2: 98%RA 1356-1430 Pt walked 470 ft on RA with hand held asst. Tolerated well. Remained in NSR. To recliner after walk.   Graylon Good, RN BSN  02/14/2020 2:24 PM

## 2020-02-14 NOTE — Progress Notes (Signed)
Pt converted to NSR while on amio drip.

## 2020-02-14 NOTE — Progress Notes (Signed)
Prior to starting po amiodarone and stopping IV Amiodarone Pt converted to AFIB rate controlled HR 94  @ 1054 , 12 LED EKG done and PA Myron notified waiting for return call. PT stable. Will continue  to monitor.   Phoebe Sharps, RN

## 2020-02-15 ENCOUNTER — Inpatient Hospital Stay (HOSPITAL_COMMUNITY): Payer: PPO

## 2020-02-15 LAB — BASIC METABOLIC PANEL
Anion gap: 9 (ref 5–15)
BUN: 10 mg/dL (ref 8–23)
CO2: 27 mmol/L (ref 22–32)
Calcium: 8.6 mg/dL — ABNORMAL LOW (ref 8.9–10.3)
Chloride: 105 mmol/L (ref 98–111)
Creatinine, Ser: 0.72 mg/dL (ref 0.44–1.00)
GFR calc Af Amer: >60 mL/min (ref >60)
GFR calc non Af Amer: >60 mL/min (ref >60)
Glucose, Bld: 106 mg/dL — ABNORMAL HIGH (ref 70–99)
Potassium: 3.9 mmol/L (ref 3.5–5.1)
Sodium: 141 mmol/L (ref 135–145)

## 2020-02-15 LAB — PROTIME-INR
INR: 1.9 — ABNORMAL HIGH (ref 0.8–1.2)
Prothrombin Time: 21.2 seconds — ABNORMAL HIGH (ref 11.4–15.2)

## 2020-02-15 LAB — MAGNESIUM: Magnesium: 1.7 mg/dL (ref 1.7–2.4)

## 2020-02-15 MED ORDER — AMIODARONE IV BOLUS ONLY 150 MG/100ML
150.0000 mg | Freq: Once | INTRAVENOUS | Status: DC
  Filled 2020-02-15: qty 100

## 2020-02-15 MED ORDER — FE FUMARATE-B12-VIT C-FA-IFC PO CAPS
1.0000 | ORAL_CAPSULE | Freq: Every day | ORAL | Status: DC
  Administered 2020-02-15 – 2020-02-16 (×2): 1 via ORAL
  Filled 2020-02-15 (×2): qty 1

## 2020-02-15 MED ORDER — AMIODARONE HCL IN DEXTROSE 360-4.14 MG/200ML-% IV SOLN
INTRAVENOUS | Status: AC
  Filled 2020-02-15: qty 200

## 2020-02-15 MED ORDER — AMIODARONE HCL 200 MG PO TABS
400.0000 mg | ORAL_TABLET | Freq: Two times a day (BID) | ORAL | Status: DC
  Administered 2020-02-15 – 2020-02-16 (×3): 400 mg via ORAL
  Filled 2020-02-15 (×3): qty 2

## 2020-02-15 MED FILL — Heparin Sodium (Porcine) Inj 1000 Unit/ML: INTRAMUSCULAR | Qty: 30 | Status: AC

## 2020-02-15 MED FILL — Potassium Chloride Inj 2 mEq/ML: INTRAVENOUS | Qty: 40 | Status: AC

## 2020-02-15 MED FILL — Lidocaine HCl Local Preservative Free (PF) Inj 2%: INTRAMUSCULAR | Qty: 15 | Status: AC

## 2020-02-15 NOTE — Progress Notes (Signed)
Mobility Specialist - Progress Note   02/15/20 1539  Mobility  Activity Ambulated in hall  Level of Assistance Independent  Assistive Device None  Distance Ambulated (ft) 500 ft  Mobility Response Tolerated well  Mobility performed by Mobility specialist  $Mobility charge 1 Mobility    Pre-mobility: 93 HR During mobility:130 HR Post-mobility: 78 HR  Monitor showed Afib during mobility and after. Highest HR while walking was in the 130s, it was 152 after the pt sat down. The pt endorsed feeling palpitations during this time and said she had SOB due to her mask. I instructed her to take deep breathes through her nose and to exhale through pursed lips, after this she said she felt calm and her HR went down to 78. I left her sitting in her room with her husband present.   [place] Specialist

## 2020-02-15 NOTE — Progress Notes (Addendum)
5 Days Post-Op Procedure(s) (LRB): MINIMALLY INVASIVE MITRAL VALVE REPAIR (MVR) using Memo 4D 32 MM Mitral Valve Ring. (Right) TRANSESOPHAGEAL ECHOCARDIOGRAM (TEE) (N/A) Subjective: Sitting up in bed, says she feels well. Having some intermittent sharp pains adjacent to the right chest incision.  Doing well with mobility. BM yesterday.   Objective: Vital signs in last 24 hours: Temp:  [97.7 F (36.5 C)-98.6 F (37 C)] 98 F (36.7 C) (05/19 0757) Pulse Rate:  [77-104] 83 (05/19 0757) Cardiac Rhythm: Normal sinus rhythm (05/19 0821) Resp:  [15-22] 15 (05/19 0757) BP: (98-107)/(62-72) 98/62 (05/19 0757) SpO2:  [94 %-100 %] 95 % (05/19 0757) Weight:  [61.9 kg] 61.9 kg (05/19 0500)     Intake/Output from previous day: 05/18 0701 - 05/19 0700 In: 458 [P.O.:350; I.V.:108] Out: -  Intake/Output this shift: No intake/output data recorded.  Physical Exam: General appearance:alert, cooperative and no distress Neurologic:intact Heart:Regular rhythm, no murmur. Monitor shows NSR now but had another brief episode of AF ~4am today. Lungs:Breath sounds are clear, good sats on RA.  Abdomen:NT Extremities:Well perfused, no significant edema. Wound:the chest incision is  a dry. Expected periincisional bruising.   Lab Results: Recent Labs    02/13/20 0523 02/14/20 0337  WBC 11.7* 7.3  HGB 9.5* 9.0*  HCT 28.4* 26.8*  PLT 101* 106*   BMET:  Recent Labs    02/14/20 0337 02/15/20 0259  NA 139 141  K 3.2* 3.9  CL 102 105  CO2 27 27  GLUCOSE 108* 106*  BUN 12 10  CREATININE 0.63 0.72  CALCIUM 8.5* 8.6*    PT/INR:  Recent Labs    02/15/20 0259  LABPROT 21.2*  INR 1.9*   ABG    Component Value Date/Time   PHART 7.353 02/10/2020 1503   HCO3 26.0 02/10/2020 1503   TCO2 27 02/10/2020 1503   ACIDBASEDEF 1.0 02/10/2020 1310   O2SAT 100.0 02/10/2020 1503   CBG (last 3)  No results for input(s): GLUCAP in the last 72 hours.  Assessment/Plan: S/P Procedure(s)  (LRB): MINIMALLY INVASIVE MITRAL VALVE REPAIR (MVR) using Memo 4D 32 MM Mitral Valve Ring. (Right) TRANSESOPHAGEAL ECHOCARDIOGRAM (TEE) (N/A)  -POD-5 MINI MV repair with a 32mm annuloplasty ring. Progressing well with diet and actrivity.  Coumadin dose @ 1mg  daily, INR 1.9.  Anticipate discharge tomorrow.  -Post-op atrial fibrillation- converted quickly with initiation of IV amiodarone protocol but had PAF yesterday and this AM. Oral amiodarone increased to 400mg  po BID.    -Hypokalemia-replaced  -EBL anemia- Hct stable  -Thrombocytopenia- Plt count recovering.   -DVT PPX- continue daily [place] Lovenox until INR therapeutic.   -History of frequent PVC's and PAC's- Now in stable SR. Stop flecainide since we are loading with amiodarone.    -History of hypothyroidism- levothyroxine resumed.   LOS: 5 days    Antony Odea 02/15/2020   I have seen and examined the patient and agree with the assessment and plan as outlined.  Tentatively plan d/c home tomorrow if rhythm stable.  Rexene Alberts, MD 02/15/2020 4:24 PM

## 2020-02-15 NOTE — Progress Notes (Signed)
CARDIAC REHAB PHASE I   PRE:  Rate/Rhythm: 93 SR PVCs  BP:  Supine:   Sitting: 104/59  Standing:    SaO2: 96%RA  MODE:  Ambulation: 470 ft   POST:  Rate/Rhythm: 106-122 ST   BP:  Supine:   Sitting: 120/75  Standing:    SaO2: 98%RA 1056-1136 Assisted pt at sink to wash face and then bathroom trip. Pt walked 470 ft on RA with steady gait. Tolerated well. Heart rate elevated with activity and pt stated feeling a little tired with it.  To recliner after walk.    Graylon Good, RN BSN  02/15/2020 11:33 AM

## 2020-02-15 NOTE — Discharge Summary (Signed)
Physician Discharge Summary  Patient ID: Kellie Humphrey MRN: [id number] DOB/AGE: [birth year] 74 y.o.  Admit date: 02/10/2020 Discharge date: 02/16/2020  Admission Diagnoses:  Severe mitral valve insufficiency with prolapse History of frequent PAC's and PVC's   Discharge Diagnoses:   Severe mitral valve insufficiency with prolapse History of frequent PAC's and PVC's S/P minimally-invasive mitral valve repair Post-operative atrial fibrillation Expected acute blood loss anemia Thrombocytopenia  Discharged Condition: stable  History of Present Illness:  Patient is a 74 year old female with history of mitral valve prolapse and moderate to severe asymptomatic primary mitral regurgitation. She was originally seen in consultation on August 31, 2018 and I saw her more recently on March 07, 2019. At that time she continued to do well and reports no significant symptoms. She had originally presented with history of palpitations and event monitors revealed frequent PVCs and PACs. Symptoms improved with oral flecainide therapy. She underwent stress echocardiography in May 2020 and did well with no significant symptoms of exertional shortness of breath and normal left ventricular systolic function. Continued medical therapy with close observation was recommended.  I spoke with the patient over the telephoneon December 28, 2033for routine follow-up. Shepreviouslyunderwent transthoracic echocardiogram June 27, 2019 which revealed stable findings with normal left ventricular size and systolic function and bileaflet prolapse with moderate to severe mitral regurgitation. However, the patient does report that over the past several months she has started to notice a tendency to get mildly short of breath with more strenuous exertion, such as walking up a hill.Since then she was seen in follow-up by Dr. [name] on November 23, 2019 and she subsequently underwent transesophageal  echocardiogram on December 12, 2019 TEE revealed bileaflet prolapse with moderate to severe mitral regurgitation. Left ventricular systolic function remain normal with ejection fraction estimated 55 to 60%. There was mild left atrial enlargement. Right ventricular size and function was normal.  Patient is married and lives locally in [place] with her husband. They own a vacation property in [place] and spend a great deal of time there as well. The patient walks at least for 5 days every week, typically for several miles. In the [place]. She specifically denies any symptoms of exertional shortness of breath or fatigue. She has not appreciated any decline in her exercise tolerance. She states that when she was having the sensation of skipped beats they seem to get worse after long walks, but they were not associated with any shortness of breath or chest discomfort. Currently on oral flecainide she no longer gets the sensation of skipped beats and she continues to walk on a regular basis without any limitation. She specifically denies any history of PND, orthopnea, or lower extremity edema. She has not had any dizzy spells or palpitations.  Patient returns with her husband to further discuss the results of her recent TEE and whether or not to consider elective mitral valve repair. She reports no further decline in her exercise tolerance although she does admit that she tends to get short of breath when walking up a hill. Despite this she continues to walk 4 or 5 miles every day without significant problems and she only gets short of breath if she is walking uphill. She has not had any resting shortness of breath, PND, orthopnea, or lower extremity edema. She has not had increasing symptoms of palpitations and she has never had any chest pain or chest tightness.Her energy level remains good.     Hospital Course:   Ms. [name] was  admitted for elective surgery on  02/10/2020.  She was taken to the operating room where minimally invasive mitral valve repair was carried out that included complex valvuloplasty including Gore-Tex neocord placement and edge-to-edge suture plication of the anterior commissure.  A 32 mm memo 4D annuloplasty ring was also placed.  Following the procedure, the patient was extubated in the operating room and transferred to the surgical ICU.  She remained hemodynamically stable.  He was mobilized on the first postoperative day.  She was diuresed for expected moderate volume overload.  She was initially in a stable sinus rhythm but exception of a few PVCs and PACs which are chronic.  She was transferred to 4 E., progressive care where diet and activity were advanced and well-tolerated.  She developed atrial fibrillation on postop day 3 and was started on IV amiodarone with standard loading dose.  She quickly converted back to sinus rhythm so the amiodarone was converted to oral form.  Her potassium was replaced as required.  She had a few other episodes of paroxysmal atrial fibrillation so her oral amiodarone was increased to from 200 mg twice daily to 400 mg p.o. twice daily. She otherwise made a progressive and uneventful recovery.  By the time of her discharge, she was completely independent with her ambulation.  She was maintaining excellent oxygen saturation on room air.  She was having appropriate bowel bladder function. She was anticoagulated with Coumadin postoperatively and the INR was monitored daily.  At the time of discharge, her INR was 1.6 on a Coumadin dose of 1mg  daily. Coumadin will be increased to 2.5mg  po daily at discharge.    Consults: None  Significant Diagnostic Studies:   TRANSESOPHOGEAL ECHO REPORT     Patient Name: Kellie Humphrey Date of Exam: 12/12/2019  Medical Rec #: [id number] Height: 67.0 in  Accession #: [id number] Weight: 137.0 lb  Date of Birth: 02-14-1946 BSA: 1.722 m  Patient Age: 29 years BP: 125/74  mmHg  Patient Gender: F HR: 64 bpm.  Exam Location: Inpatient   Procedure: Transesophageal Echo, 3D Echo, Color Doppler and Cardiac  Doppler   Indications: I34.1 Nonrheumatic mitral (valve) prolapse   History: Patient has prior history of Echocardiogram examinations,  most  recent 06/27/2019. Mitral Valve Prolapse.   Sonographer: Raquel Sarna Senior RDCS  Referring Phys: [id number] [place]  Diagnosing Phys: Ena Dawley MD   PROCEDURE: After discussion of the risks and benefits of a TEE, an  informed consent was obtained from the patient. The transesophogeal probe  was passed without difficulty through the esophogus of the patient.  Sedation performed by different physician.  The patient was monitored while under deep sedation. Anesthestetic  sedation was provided intravenously by Anesthesiology: 192mg  of Propofol.  The patient's vital signs; including heart rate, blood pressure, and  oxygen saturation; remained stable  throughout the procedure. The patient developed no complications during  the procedure.   IMPRESSIONS    1. 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 The jet hits the  posterior wall, however there is no reversal of flow in the pulmonary  vains.  2. Left ventricular ejection fraction, by estimation, is 55 to 60%. The  left ventricle has normal function. The left ventricle has no regional  wall motion abnormalities. Left ventricular diastolic function could not  be evaluated.  3. Right ventricular systolic function is normal. The right ventricular  size is normal. There is  normal pulmonary artery systolic pressure.  4. Left atrial size was mildly dilated. No left atrial/left atrial  appendage thrombus was detected.  5. The mitral valve is myxomatous. Moderate to severe mitral valve  regurgitation. No evidence of mitral stenosis.  6. The aortic valve is normal in  structure. Aortic valve regurgitation is  not visualized. No aortic stenosis is present.  7. The inferior vena cava is normal in size with greater than 50%  respiratory variability, suggesting right atrial pressure of 3 mmHg.   Conclusion(s)/Recommendation(s): Normal biventricular function without  evidence of hemodynamically significant valvular heart disease.   FINDINGS  Left Ventricle: Left ventricular ejection fraction, by estimation, is 55  to 60%. The left ventricle has normal function. The left ventricle has no  regional wall motion abnormalities. The left ventricular internal cavity  size was normal in size. There is  no left ventricular hypertrophy. Left ventricular diastolic function  could not be evaluated.   Right Ventricle: The right ventricular size is normal. No increase in  right ventricular wall thickness. Right ventricular systolic function is  normal. There is normal pulmonary artery systolic pressure.   Left Atrium: Left atrial size was mildly dilated. No left atrial/left  atrial appendage thrombus was detected.   Right Atrium: Right atrial size was normal in size.   Pericardium: There is no evidence of pericardial effusion.   Mitral Valve: The mitral valve is myxomatous. There is mild prolapse of  both leaflets of the mitral valve. Normal mobility of the mitral valve  leaflets. Moderate to severe mitral valve regurgitation. No evidence of  mitral valve stenosis.   Tricuspid Valve: The tricuspid valve is normal in structure. Tricuspid  valve regurgitation is mild . No evidence of tricuspid stenosis.   Aortic Valve: The aortic valve is normal in structure. Aortic valve  regurgitation is not visualized. No aortic stenosis is present.   Pulmonic Valve: The pulmonic valve was normal in structure. Pulmonic valve  regurgitation is not visualized. No evidence of pulmonic stenosis.   Aorta: The aortic root is normal in size and structure. There is minimal  (Grade  I) plaque.   Venous: The inferior vena cava is normal in size with greater than 50%  respiratory variability, suggesting right atrial pressure of 3 mmHg.   IAS/Shunts: No atrial level shunt detected by color flow Doppler.    MR Peak grad: 83.2 mmHg  MR Mean grad: 59.0 mmHg  MR Vmax: 456.00 cm/s  MR Vmean: 366.0 cm/s  MR PISA: 2.26 cm  MR PISA Eff ROA: 19 mm  MR PISA Radius: 0.60 cm   Ena Dawley MD  Electronically signed by Ena Dawley MD  Signature Date/Time: 12/12/2019/11:17:27 AM  RIGHT/LEFT HEART CATH AND CORONARY ANGIOGRAPHY  Conclusion  1. No angiographic evidence of CAD  2. Normal filling pressures.  3. Severe mitral regurgitation by echo  4. PA sat 83% x 2, SVC sat 87%.  Continue with planning for mitral valve surgery.   Recommendations  Antiplatelet/Anticoag Continue planning for mitral valve surgery.  Indications  Severe mitral regurgitation [I34.0 (ICD-10-CM)]  Procedural Details  Technical Details Indication: 74 yo female with severe mitral regurgitation.   Procedure: The risks, benefits, complications, treatment options, and expected outcomes were discussed with the patient. The patient and/or family concurred with the proposed plan, giving informed consent. The patient was brought to the cath lab after IV hydration was given. The patient was sedated with Versed and Fentanyl. The IV catheter present in the right antecubital vein was  changed for a 5 [place] sheath. Right heart catheterization performed with a balloon tipped catheter. The right wrist was prepped and draped in a sterile fashion. 1% lidocaine was used for local anesthesia. Using the modified Seldinger access technique, a 5 French sheath was placed in the right radial artery. 3 mg Verapamil was given through the sheath. 3000 units IV heparin was given. Standard diagnostic catheters were used to perform selective coronary angiography. LV pressures measured with a JR4 catheter. The sheath was removed  from the right radial artery and a Terumo hemostasis band was applied at the arteriotomy site on the right wrist.    Estimated blood loss <50 mL.   During this procedure medications were administered to achieve and maintain moderate conscious sedation while the patient's heart rate, blood pressure, and oxygen saturation were continuously monitored and I was present face-to-face 100% of this time.  Medications  (Filter: Administrations occurring from 01/04/20 1200 to 01/04/20 1251)  fentaNYL (SUBLIMAZE) injection (mcg)  Total dose: 25 mcg  Date/Time  Rate/Dose/Volume Action  01/04/20 1214  25 mcg Given  midazolam (VERSED) injection (mg)  Total dose: 1 mg  Date/Time  Rate/Dose/Volume Action  01/04/20 1214  1 mg Given  lidocaine (PF) (XYLOCAINE) 1 % injection (mL)  Total volume: 6 mL  Date/Time  Rate/Dose/Volume Action  01/04/20 1223  3 mL Given  1227  3 mL Given  Radial Cocktail/Verapamil only (mL)  Total volume: 10 mL  Date/Time  Rate/Dose/Volume Action  01/04/20 1229  10 mL Given  heparin injection (Units)  Total dose: 3,000 Units  Date/Time  Rate/Dose/Volume Action  01/04/20 1233  3,000 Units Given  Heparin (Porcine) in NaCl 1000-0.9 UT/500ML-% SOLN (mL)  Total volume: 1,000 mL  Date/Time  Rate/Dose/Volume Action  01/04/20 1235  500 mL Given  1235  500 mL Given  iohexol (OMNIPAQUE) 350 MG/ML injection (mL)  Total volume: 70 mL  Date/Time  Rate/Dose/Volume Action  01/04/20 1241  70 mL Given  Sedation Time  Sedation Time Physician-1: 24 minutes 3 seconds  Contrast  Medication Name Total Dose  iohexol (OMNIPAQUE) 350 MG/ML injection 70 mL  Radiation/Fluoro  Fluoro time: 3.3 (min)  DAP: 4765 (mGycm2)  Cumulative Air Kerma: 99991111 (mGy)  Complications  Complications documented before study signed (01/04/2020 1:48 PM)   RIGHT/LEFT HEART CATH AND CORONARY ANGIOGRAPHY   None Documented by Burnell Blanks, MD 01/04/2020 12:44 PM  Date Found: 01/04/2020  Time Range:  Intraprocedure    Coronary Findings  Diagnostic  Dominance: Right  Left Anterior Descending  Vessel is large. Vessel is angiographically normal.  Left Circumflex  Vessel is large. Vessel is angiographically normal.  Right Coronary Artery  Vessel is large. Vessel is angiographically normal.  Intervention  No interventions have been documented.  Coronary Diagrams  Diagnostic  Dominance: Right   Intervention  Implants     No implant documentation for this case.  Syngo Images  Link to Procedure Log   Show images for CARDIAC CATHETERIZATION Procedure Log  Images on Long Term Storage    Show images for [place], [place]   Hemo Data   Most Recent Value  RA A Wave 5 mmHg  RA V Wave 3 mmHg  RA Mean 1 mmHg  RV Systolic Pressure 24 mmHg  RV Diastolic Pressure -1 mmHg  RV EDP 4 mmHg  PA Systolic Pressure 25 mmHg  PA Diastolic Pressure 3 mmHg  PA Mean 13 mmHg  PW A Wave 9 mmHg  PW V Wave 10 mmHg  PW Mean 6 mmHg  LV Systolic Pressure 99991111 mmHg  LV Diastolic Pressure 3 mmHg  LV EDP 9 mmHg  AOp Systolic Pressure 99991111 mmHg  AOp Diastolic Pressure 53 mmHg  AOp Mean Pressure 74 mmHg  LVp Systolic Pressure 98 mmHg  LVp Diastolic Pressure 3 mmHg  LVp EDP Pressure 9 mmHg  CT ANGIOGRAPHY CHEST, ABDOMEN AND PELVIS  TECHNIQUE:  Non-contrast CT of the chest was initially obtained.  Multidetector CT imaging through the chest, abdomen and pelvis was  performed using the standard protocol during bolus administration of  intravenous contrast. Multiplanar reconstructed images and MIPs were  obtained and reviewed to evaluate the vascular anatomy.  CONTRAST: 50mL ISOVUE-370 IOPAMIDOL (ISOVUE-370) INJECTION 76%  COMPARISON: Feb 04, 2008.  FINDINGS:  CTA CHEST FINDINGS  Cardiovascular: Preferential opacification of the thoracic aorta. No  evidence of thoracic aortic aneurysm or dissection. Normal heart  size. No pericardial effusion.  Mediastinum/Nodes: No enlarged mediastinal, hilar, or  axillary lymph  nodes. Thyroid gland, trachea, and esophagus demonstrate no  significant findings.  Lungs/Pleura: No pneumothorax or pleural effusion is noted. Mild  biapical scarring is noted.  Musculoskeletal: No chest wall abnormality. No acute or significant  osseous findings.  Review of the MIP images confirms the above findings.  CTA ABDOMEN AND PELVIS FINDINGS  VASCULAR  Aorta: Normal caliber aorta without aneurysm, dissection, vasculitis  or significant stenosis.  Celiac: Patent without evidence of aneurysm, dissection, vasculitis  or significant stenosis.  SMA: Patent without evidence of aneurysm, dissection, vasculitis or  significant stenosis.  Renals: Both renal arteries are patent without evidence of aneurysm,  dissection, vasculitis, fibromuscular dysplasia or significant  stenosis.  IMA: Patent without evidence of aneurysm, dissection, vasculitis or  significant stenosis.  Inflow: Patent without evidence of aneurysm, dissection, vasculitis  or significant stenosis.  Veins: No obvious venous abnormality within the limitations of this  arterial phase study.  Review of the MIP images confirms the above findings.  NON-VASCULAR  Hepatobiliary: No gallstones or biliary dilatation is noted. Stable  hepatic cysts are noted.  Pancreas: Unremarkable. No pancreatic ductal dilatation or  surrounding inflammatory changes.  Spleen: Normal in size without focal abnormality.  Adrenals/Urinary Tract: Adrenal glands are unremarkable. Kidneys are  normal, without renal calculi, focal lesion, or hydronephrosis.  Bladder is unremarkable.  Stomach/Bowel: Stomach is within normal limits. Appendix appears  normal. No evidence of bowel wall thickening, distention, or  inflammatory changes.  Lymphatic: No significant adenopathy is noted.  Reproductive: Uterus and left adnexal regions are unremarkable. 5 cm  right ovarian cystic abnormality is noted.  Other: No abdominal wall hernia or  abnormality. No abdominopelvic  ascites.  Musculoskeletal: No acute or significant osseous findings.  Review of the MIP images confirms the above findings.  IMPRESSION:  1. No evidence of thoracic or abdominal aortic aneurysm or  dissection.  2. Stable hepatic cysts.  3. 5 cm right ovarian cystic abnormality is noted. Pelvic ultrasound  is recommended for further evaluation.  Electronically Signed  By: Marijo Conception M.D.  On: 01/09/2020 13:26    Treatments:   CARDIOTHORACIC SURGERY OPERATIVE NOTE  Date of Procedure:                02/10/2020  Preoperative Diagnosis:      Severe Mitral Regurgitation  Postoperative Diagnosis:    Same  Procedure:        Minimally-Invasive Mitral Valve Repair             Complex valvuloplasty including artificial  Gore-tex neochord placement x8             Edge-to-edge suture plication of anterior commissure             [name] 4D Ring Annuloplasty (size 73mm, catalog # [id number], serial # [id number])               Surgeon:        Valentina Gu. Roxy Manns, MD  Assistant:       Enid Cutter, PA-C  Anesthesia:    [name], MD  Operative Findings: ? Barlow's type myxomatous degenerative disease ? Mitral annular calcificiation with severe calcification beneath anterior commissure ? Bileaflet prolapse with elongated primary chordae tendinae ? Type II dysfunction with severe mitral regurgitation ? Dilated left ventricle with normal left ventricular systolic function ? No residual mitral regurgitation after successful valve repair                    BRIEF CLINICAL NOTE AND INDICATIONS FOR SURGERY  Patient is a 74 year old female with history of mitral valve prolapse and moderate to severe asymptomatic primary mitral regurgitation. She was originally seen in consultation on August 31, 2018 and I saw her more recently on March 07, 2019. At that time she continued to do well and reports no significant  symptoms. She had originally presented with history of palpitations and event monitors revealed frequent PVCs and PACs. Symptoms improved with oral flecainide therapy. She underwent stress echocardiography in May 2020 and did well with no significant symptoms of exertional shortness of breath and normal left ventricular systolic function. Continued medical therapy with close observation was recommended.  I spoke with the patient over the telephoneon December 28, 2016for routine follow-up. Shepreviouslyunderwent transthoracic echocardiogram June 27, 2019 which revealed stable findings with normal left ventricular size and systolic function and bileaflet prolapse with moderate to severe mitral regurgitation. However, the patient does report that over the past several months she has started to notice a tendency to get mildly short of breath with more strenuous exertion, such as walking up a hill.Since then she was seen in follow-up by Dr. [name] on November 23, 2019 and she subsequently underwent transesophageal echocardiogram on December 12, 2019 TEE revealed bileaflet prolapse with moderate to severe mitral regurgitation. Left ventricular systolic function remain normal with ejection fraction estimated 55 to 60%. There was mild left atrial enlargement. Right ventricular size and function was normal.  The patient has been seen in consultation and counseled at length regarding the indications, risks and potential benefits of surgery.  All questions have been answered, and the patient provides full informed consent for the operation as described.    Discharge Exam: Blood pressure 95/66, pulse (!) 111, temperature 98.4 F (36.9 C), temperature source Oral, resp. rate 15, height 5\' 7"  (1.702 m), weight 62.2 kg, SpO2 96 %.  Physical Exam: General appearance:alert, cooperative and no distress Neurologic:intact Heart:Regular rhythm, no murmur. Monitor showsNSRnow. Had PAF with  reasonably controlled rate yesterday afternoon and evening.  Lungs:Breath sounds are clear,good sats on RA. Abdomen:NT Extremities:Well perfused, no significant edema. Wound:the chest incision is a dry. Expected periincisional bruising.  Disposition:  Discharged to home in stable condition  Discharge Instructions    Amb Referral to Cardiac Rehabilitation   Complete by: As directed    Diagnosis: Valve Repair   Valve: Mitral Comment - mini   After initial evaluation and assessments completed: Virtual Based Care may be provided alone or in conjunction with Phase 2 Cardiac Rehab  based on patient barriers.: Yes     Allergies as of 02/16/2020      Reactions   Codeine Nausea And Vomiting      Medication List    STOP taking these medications   fish oil-omega-3 fatty acids 1000 MG capsule   flecainide 50 MG tablet Commonly known as: TAMBOCOR     TAKE these medications   acetaminophen 500 MG tablet Commonly known as: TYLENOL Take 500-1,000 mg by mouth See admin instructions. Take 500 mg at night, may take a second 500 -1000 mg dose as needed for pain   amiodarone 200 MG tablet Commonly known as: PACERONE Take 2 tablets (400 mg total) by mouth 2 (two) times daily. For 5 days then reduce dose to 1 tablet ( 200mg )  by mouth twice daily.   aspirin 81 MG EC tablet Take 1 tablet (81 mg total) by mouth daily. Start taking on: Feb 17, 2020   B-12 5000 MCG Caps Take 5,000 mcg by mouth 2 (two) times a week. Mondays & Fridays.   Biotin 5000 MCG Caps Take 5,000 mcg by mouth every evening.   coumadin book Misc 1 each by Does not apply route once for 1 dose.   diclofenac sodium 1 % Gel Commonly known as: VOLTAREN APPLY 2-4 GRAMS TO LARGE JOINT AREA UP TO FOUR TIMES A DAY AS NEEDED What changed:   how much to take  how to take this  when to take this  reasons to take this  additional instructions   diphenhydrAMINE 25 MG tablet Commonly known as: BENADRYL Take 25 mg  by mouth at bedtime.   gabapentin 600 MG tablet Commonly known as: NEURONTIN Take 600 mg by mouth 2 (two) times daily.   melatonin 5 MG Tabs Take 5 mg by mouth at bedtime.   metoprolol tartrate 25 MG tablet Commonly known as: LOPRESSOR Take 0.5 tablets (12.5 mg total) by mouth 2 (two) times daily. What changed: how much to take   nitrofurantoin 50 MG capsule Commonly known as: MACRODANTIN Take 1 capsule (50 mg total) by mouth daily. What changed: when to take this   prednisoLONE acetate 1 % ophthalmic suspension Commonly known as: PRED FORTE Place 1 drop into the left eye 3 (three) times daily.   Synthroid 50 MCG tablet Generic drug: levothyroxine Take 1 tablet (50 mcg total) by mouth daily before breakfast.   Systane Ultra 0.4-0.3 % Soln Generic drug: Polyethyl Glycol-Propyl Glycol Place 1 drop into the left eye every 4 (four) hours as needed (dry/irritated eyes. (4-6 times daily)).   traMADol 50 MG tablet Commonly known as: ULTRAM Take 1 tablet (50 mg total) by mouth every 6 (six) hours as needed for up to 5 days for moderate pain.   Vitamin D 50 MCG (2000 UT) tablet Take 2,000 Units by mouth daily.   warfarin 2.5 MG tablet Commonly known as: COUMADIN Take 1 tablet (2.5 mg total) by mouth daily at 4 PM. Or as instructed by the Coumadin Clinic.      Follow-up Information    [facility]. Go on 02/20/2020.   Specialty: Cardiology Why: You have an appointment with the Coumadin Clinic on Monday, 02/20/20 ar 10:30am for an INR blood test.  Contact information: [address] [place] [phone]       [facility]. Go on 02/23/2020.   Specialty: Cardiothoracic Surgery Why: You have an appointment for suture removal at 10:00am.  Contact information: [place], [place]       Dorothy Spark, MD. Go  on 03/02/2020.   Specialty: Cardiology Why: Your appointment is at 10:00am. Contact information: [place] [phone]        [facility] [place]. Go on 03/12/2020.   Specialty: Cardiothoracic Surgery Why: Your appointment is at 2:00pm.  Please arrive 30 minutes early for a chest x-ray to be performed by [facility] located on the first floor of the same building.  Contact information: [place], [place] [phone]         The patient has been discharged on:   1.Beta Blocker:  Yes [ x  ]                              No   [   ]                              If No, reason:  2.Ace Inhibitor/ARB: Yes [   ]                                     No  [  x  ]                                     If No, reason: Not indicated  3.Statin:   Yes [   ]                  No  [ x  ]                  If No, reason: No CAD  4.Shela CommonsVelta Addison  [ x  ]                  No   [   ]                  If No, reason:  Signed: Antony Odea, PA-C 02/16/2020, 9:33 AM

## 2020-02-16 LAB — BASIC METABOLIC PANEL
Anion gap: 8 (ref 5–15)
BUN: 11 mg/dL (ref 8–23)
CO2: 26 mmol/L (ref 22–32)
Calcium: 8.5 mg/dL — ABNORMAL LOW (ref 8.9–10.3)
Chloride: 106 mmol/L (ref 98–111)
Creatinine, Ser: 0.69 mg/dL (ref 0.44–1.00)
GFR calc Af Amer: >60 mL/min (ref >60)
GFR calc non Af Amer: >60 mL/min (ref >60)
Glucose, Bld: 115 mg/dL — ABNORMAL HIGH (ref 70–99)
Potassium: 3.6 mmol/L (ref 3.5–5.1)
Sodium: 140 mmol/L (ref 135–145)

## 2020-02-16 LAB — PROTIME-INR
INR: 1.6 — ABNORMAL HIGH (ref 0.8–1.2)
Prothrombin Time: 18.8 seconds — ABNORMAL HIGH (ref 11.4–15.2)

## 2020-02-16 LAB — MAGNESIUM: Magnesium: 1.6 mg/dL — ABNORMAL LOW (ref 1.7–2.4)

## 2020-02-16 MED ORDER — TRAMADOL HCL 50 MG PO TABS: 50.0000 mg | ORAL_TABLET | Freq: Four times a day (QID) | ORAL | 0 refills | Status: AC | PRN

## 2020-02-16 MED ORDER — POTASSIUM CHLORIDE CRYS ER 20 MEQ PO TBCR
40.0000 meq | EXTENDED_RELEASE_TABLET | Freq: Once | ORAL | Status: AC
  Administered 2020-02-16: 40 meq via ORAL
  Filled 2020-02-16: qty 2

## 2020-02-16 MED ORDER — WARFARIN SODIUM 2.5 MG PO TABS: 2.5000 mg | ORAL_TABLET | Freq: Every day | ORAL | Status: DC

## 2020-02-16 MED ORDER — METOPROLOL TARTRATE 25 MG PO TABS
12.5000 mg | ORAL_TABLET | Freq: Two times a day (BID) | ORAL | 2 refills | Status: DC
Start: 2020-02-16 — End: 2020-03-02

## 2020-02-16 MED ORDER — ASPIRIN 81 MG PO TBEC: 81.0000 mg | DELAYED_RELEASE_TABLET | Freq: Every day | ORAL | Status: DC

## 2020-02-16 MED ORDER — WARFARIN SODIUM 2.5 MG PO TABS: 2.5000 mg | ORAL_TABLET | Freq: Every day | ORAL | 2 refills | Status: DC

## 2020-02-16 MED ORDER — COUMADIN BOOK
Freq: Once | Status: AC
  Filled 2020-02-16: qty 1

## 2020-02-16 MED ORDER — COUMADIN BOOK: 1.0000 | Freq: Once | 0 refills | Status: AC

## 2020-02-16 MED ORDER — AMIODARONE HCL 200 MG PO TABS: 400.0000 mg | ORAL_TABLET | Freq: Two times a day (BID) | ORAL | 2 refills | Status: DC

## 2020-02-16 MED FILL — Sodium Bicarbonate IV Soln 8.4%: INTRAVENOUS | Qty: 50 | Status: AC

## 2020-02-16 MED FILL — Electrolyte-R (PH 7.4) Solution: INTRAVENOUS | Qty: 5000 | Status: AC

## 2020-02-16 MED FILL — Mannitol IV Soln 20%: INTRAVENOUS | Qty: 500 | Status: AC

## 2020-02-16 MED FILL — Heparin Sodium (Porcine) Inj 1000 Unit/ML: INTRAMUSCULAR | Qty: 20 | Status: AC

## 2020-02-16 MED FILL — Sodium Chloride IV Soln 0.9%: INTRAVENOUS | Qty: 2000 | Status: AC

## 2020-02-16 NOTE — Plan of Care (Signed)
  Problem: Health Behavior/Discharge Planning: Goal: Ability to manage health-related needs will improve Outcome: Progressing   

## 2020-02-16 NOTE — Progress Notes (Signed)
Orthostatics completed.  Pt symptomatic.  PA notified and d/c is to be held until this afternoon.  Hold PM does metoprolol and restart in am at 12.5 mg dose

## 2020-02-16 NOTE — Progress Notes (Signed)
0845-0910 Education completed with pt and husband who voiced understanding. Reviewed wound care, IS, walking for ex, gave heart healthy diet, and CRP 2.  Referred to [place] CRP 2. Gave pt OFF the BEAT booklet due to atrial fib. Asked pharmacy to send pt Coumadin booklet. Graylon Good RN BSN 02/16/2020 9:15 AM

## 2020-02-16 NOTE — Progress Notes (Signed)
Mobility Specialist: Progress Note    02/16/20 1649  Mobility  Activity Ambulated in hall  Level of Assistance Independent  Assistive Device None  Distance Ambulated (ft) 500 ft  Mobility Response Tolerated well  Mobility performed by Mobility specialist  Bed Position Chair  $Mobility charge 1 Mobility   Pre-Mobility: 76 HR, 97/65 BP, 96% SpO2 Post-Mobility: 80 HR, 100% SpO2  Pt tolerated ambulation well. Pt states this was her first walk of the Kellie Humphrey. Pt felt that she recovered faster today after ambulation. Pt was positioned in chair with call bell within reach and husband in room.   Kellie Humphrey Mobility Specialist

## 2020-02-16 NOTE — Care Management (Signed)
Patient discharging today post op day #6 Ambulating well, No needs identified for DME or home health.

## 2020-02-16 NOTE — Progress Notes (Signed)
   02/16/20 1025  Assess: MEWS Score  Temp (!) 97.5 F (36.4 C)  BP (!) 89/64  Pulse Rate 79  ECG Heart Rate 80  Resp (!) 22  Level of Consciousness Alert  SpO2 96 %  O2 Device Room Air  Assess: MEWS Score  MEWS Temp 0  MEWS Systolic 1  MEWS Pulse 0  MEWS RR 2  MEWS LOC 0  MEWS Score 3  MEWS Score Color Yellow  Assess: if the MEWS score is Yellow or Red  Were vital signs taken at a resting state? Yes  Focused Assessment Documented focused assessment  Early Detection of Sepsis Score *See Row Information* Low  MEWS guidelines implemented *See Row Information* Yes  Treat  MEWS Interventions Administered scheduled meds/treatments  Take Vital Signs  Increase Vital Sign Frequency  Yellow: Q 2hr X 2 then Q 4hr X 2, if remains yellow, continue Q 4hrs  Escalate  MEWS: Escalate Yellow: discuss with charge nurse/RN and consider discussing with provider and RRT  Notify: Rapid Response  Name of Rapid Response RN Notified na

## 2020-02-16 NOTE — Progress Notes (Addendum)
6 Days Post-Op Procedure(s) (LRB): MINIMALLY INVASIVE MITRAL VALVE REPAIR (MVR) using Memo 4D 32 MM Mitral Valve Ring. (Right) TRANSESOPHAGEAL ECHOCARDIOGRAM (TEE) (N/A) Subjective: Feels good, doing well with ambulation. She is eager to return home.    Objective: Vital signs in last 24 hours: Temp:  [98.1 F (36.7 C)-98.6 F (37 C)] 98.4 F (36.9 C) (05/20 0716) Pulse Rate:  [87-102] 89 (05/20 0716) Cardiac Rhythm: Normal sinus rhythm (05/19 2130) Resp:  [15-18] 15 (05/20 0716) BP: (94-120)/(61-75) 95/66 (05/20 0716) SpO2:  [96 %-98 %] 96 % (05/20 0716) Weight:  [62.2 kg] 62.2 kg (05/20 0323)     Intake/Output from previous day: No intake/output data recorded. Intake/Output this shift: No intake/output data recorded.  Physical Exam: General appearance:alert, cooperative and no distress Neurologic:intact Heart:Regular rhythm, no murmur. Monitor showsNSR now. Had PAF with reasonably controlled rate yesterday afternoon and evening.  Lungs:Breath sounds are clear, good sats on RA.  Abdomen:NT Extremities:Well perfused, no significant edema. Wound:the chest incision is a dry. Expected periincisional bruising.  Lab Results: Recent Labs    02/14/20 0337  WBC 7.3  HGB 9.0*  HCT 26.8*  PLT 106*   BMET:  Recent Labs    02/15/20 0259 02/16/20 0249  NA 141 140  K 3.9 3.6  CL 105 106  CO2 27 26  GLUCOSE 106* 115*  BUN 10 11  CREATININE 0.72 0.69  CALCIUM 8.6* 8.5*    PT/INR:  Recent Labs    02/16/20 0249  LABPROT 18.8*  INR 1.6*   ABG    Component Value Date/Time   PHART 7.353 02/10/2020 1503   HCO3 26.0 02/10/2020 1503   TCO2 27 02/10/2020 1503   ACIDBASEDEF 1.0 02/10/2020 1310   O2SAT 100.0 02/10/2020 1503   CBG (last 3)  No results for input(s): GLUCAP in the last 72 hours.  Assessment/Plan: S/P Procedure(s) (LRB): MINIMALLY INVASIVE MITRAL VALVE REPAIR (MVR) using Memo 4D 32 MM Mitral Valve Ring. (Right) TRANSESOPHAGEAL  ECHOCARDIOGRAM (TEE) (N/A)  -POD-6MINI MV repair with a 74mm annuloplasty ring. Progressing wellwith diet and actrivity.Coumadin dose @ 1mg  daily, INR 1.6 today..  -Post-op atrial fibrillation- converted quickly with initiation of IV amiodarone protocol but had PAF over the next few days.  Oral amiodarone increased to 400mg  po BID 5/18. Plan to continue at this dose for another 5 days.   -Hypokalemia-replaced  -EBL anemia- Hct stable   -History of frequent PVC's and PAC's. Stopped flecainide since we are loading with amiodarone.  -History of hypothyroidism- levothyroxine resumed.  -Plan discharge later this morning. Dose Coumadin at 2.5mg  daily.    LOS: 6 days    Antony Odea, PA-C [phone] 02/16/2020   I have seen and examined the patient and agree with the assessment and plan as outlined.  Staying in NSR for the most part w/ occasional paroxysmal Afib w/ controlled rate.  Okay for d/c home.  Rexene Alberts, MD 02/16/2020

## 2020-02-17 NOTE — Progress Notes (Signed)
Discharge:   Discharge summary and medications reviewed with patient and husband with all questions answered.   IV access discontinued.  CCMD notified.  Assisted via wheelchair by this nurse to private vehicle without difficulty.

## 2020-02-20 ENCOUNTER — Other Ambulatory Visit: Payer: Self-pay

## 2020-02-20 ENCOUNTER — Telehealth (HOSPITAL_COMMUNITY): Payer: Self-pay

## 2020-02-20 ENCOUNTER — Ambulatory Visit (INDEPENDENT_AMBULATORY_CARE_PROVIDER_SITE_OTHER): Payer: PPO | Admitting: *Deleted

## 2020-02-20 DIAGNOSIS — Z9889 Other specified postprocedural states: Secondary | ICD-10-CM

## 2020-02-20 DIAGNOSIS — Z5181 Encounter for therapeutic drug level monitoring: Secondary | ICD-10-CM | POA: Diagnosis not present

## 2020-02-20 DIAGNOSIS — I4891 Unspecified atrial fibrillation: Secondary | ICD-10-CM | POA: Insufficient documentation

## 2020-02-20 LAB — POCT INR: INR: 2.6 (ref 2.0–3.0)

## 2020-02-20 NOTE — Telephone Encounter (Signed)
Pt insurance is active and benefits verified through [employer]. Co-pay $15, DED 0/0 met, out of pocket $3,400/$865 met, co-insurance 0%. no pre-authorization required, Sherry/HTA 02/20/2020'@3' :41pm, REF# [id number]  Will contact patient to see if she is interested in the Cardiac Rehab Program. If interested, patient will need to complete follow up appt. Once completed, patient will be contacted for scheduling upon review by the RN Navigator.

## 2020-02-20 NOTE — Patient Instructions (Addendum)
Description    Start taking 1/2 a tablet daily except for 1 tablet on Tuesday, Thursday and Saturday. Recheck INR in 1 week. Call coumadin clinic [phone],  for any changes in medications or upcoming procedures. Main Line [phone].     A full discussion of the nature of anticoagulants has been carried out.  A benefit risk analysis has been presented to the patient, so that they understand the justification for choosing anticoagulation at this time. The need for frequent and regular monitoring, precise dosage adjustment and compliance is stressed.  Side effects of potential bleeding are discussed.  The patient should avoid any OTC items containing aspirin or ibuprofen, and should avoid great swings in general diet.  Avoid alcohol consumption.  Call if any signs of abnormal bleeding.

## 2020-02-21 ENCOUNTER — Telehealth: Payer: Self-pay

## 2020-02-21 NOTE — Telephone Encounter (Signed)
Patient contacted the office stating she had multiple symptoms and was concerned.  She is status post Mini MVRepair with Dr. Roxy Manns.  She stated that she is having some wheezing, cough, nausea, and diarrhea which started Friday.  Patient stated that her blood pressure is running 90s/60s, no swelling in her lower extremities, and not having any shortness of breath.  Patient is taking Amiodarone which was started at discharge about a week ago.   Patient is to decrease her Amiodarone starting tomorrow per her prescription.  I advised patient to see if decreasing this helps due to possible new medication side effects.  Patient is to come to office this Thursday 27th for a suture removal.  Advised to let us know then if she is still having the symptoms.  Or contact the office if her symptoms get worse.  She acknowledged receipt.  Also advised patient to contact her PCP to make them aware as well.

## 2020-02-23 ENCOUNTER — Ambulatory Visit (INDEPENDENT_AMBULATORY_CARE_PROVIDER_SITE_OTHER): Payer: Self-pay

## 2020-02-23 ENCOUNTER — Telehealth: Payer: Self-pay

## 2020-02-23 ENCOUNTER — Other Ambulatory Visit: Payer: Self-pay

## 2020-02-23 DIAGNOSIS — Z4802 Encounter for removal of sutures: Secondary | ICD-10-CM

## 2020-02-23 NOTE — Telephone Encounter (Signed)
-----   Message from Rexene Alberts, MD sent at 02/23/2020  2:51 PM EDT ----- Regarding: RE: Patient symptoms Nothing to add.  She is very anxious ----- Message ----- From: Donnella Sham, RN Sent: 02/23/2020  11:24 AM EDT To: Rexene Alberts, MD Subject: Patient symptoms                               Hey,  I just wanted to make you aware.  Patient came into the office today for a suture removal.  She stated that she was still having what she described as "Afib episodes" where her heart is beating "out of her chest", making her anxious.  She stated that it is happening less often than when she first came home for the hospital.  She stated that she is now taking her Amiodarone once a day and that the episodes are less frequent since decreasing the medication.  I did advise her that the medication was to help keep a regular heart rate but that she would only be on it temporarily (because she was having other side effects from it as well).  I answered all her and her husband's questions.   Just wanted to make you aware, is there anything in addition to what I told them?  She is aware of her follow- up appointment. Her incision sites looked great.   Thanks,  Caryl Pina

## 2020-02-23 NOTE — Progress Notes (Signed)
Patient arrived for nurse visit to remove 2 sutures post- procedure Mini MVRepair with Dr. Roxy Manns 02/10/20.  Sutures removed with no signs/ symptoms of infection noted.  Incision sites looked well.  Patient tolerated procedure well.  Patient/ family instructed to keep the incision sites clean and dry.  Patient/ family acknowledged instructions given.   Had a discussion with patient and her husband in regards to medications and bowels.  Patient did state that she was needing to take a laxative, such as Milk of Magnesia.  Which I did advise she could.  All questions answered and advised to contact out office if she had any questions or concerns.

## 2020-02-29 ENCOUNTER — Other Ambulatory Visit: Payer: Self-pay

## 2020-02-29 ENCOUNTER — Ambulatory Visit (INDEPENDENT_AMBULATORY_CARE_PROVIDER_SITE_OTHER): Payer: PPO

## 2020-02-29 DIAGNOSIS — Z9889 Other specified postprocedural states: Secondary | ICD-10-CM | POA: Diagnosis not present

## 2020-02-29 DIAGNOSIS — Z5181 Encounter for therapeutic drug level monitoring: Secondary | ICD-10-CM | POA: Diagnosis not present

## 2020-02-29 LAB — POCT INR: INR: 2.4 (ref 2.0–3.0)

## 2020-02-29 NOTE — Patient Instructions (Signed)
Description   Continue on same dosage 1/2 a tablet daily except for 1 tablet on Tuesdays, Thursdays and Saturdays. Recheck INR in 1 week. Call coumadin clinic [phone],  for any changes in medications or upcoming procedures. Main Line [phone].     

## 2020-03-02 ENCOUNTER — Telehealth: Payer: Self-pay | Admitting: Radiology

## 2020-03-02 ENCOUNTER — Ambulatory Visit: Payer: PPO | Admitting: Cardiology

## 2020-03-02 ENCOUNTER — Telehealth: Payer: Self-pay | Admitting: Cardiology

## 2020-03-02 ENCOUNTER — Other Ambulatory Visit: Payer: Self-pay

## 2020-03-02 ENCOUNTER — Telehealth: Payer: Self-pay | Admitting: *Deleted

## 2020-03-02 ENCOUNTER — Encounter: Payer: Self-pay | Admitting: Cardiology

## 2020-03-02 VITALS — BP 114/68 | HR 89 | Ht 67.0 in | Wt 130.0 lb

## 2020-03-02 DIAGNOSIS — I493 Ventricular premature depolarization: Secondary | ICD-10-CM

## 2020-03-02 DIAGNOSIS — R002 Palpitations: Secondary | ICD-10-CM

## 2020-03-02 DIAGNOSIS — I4891 Unspecified atrial fibrillation: Secondary | ICD-10-CM

## 2020-03-02 DIAGNOSIS — I10 Essential (primary) hypertension: Secondary | ICD-10-CM | POA: Diagnosis not present

## 2020-03-02 DIAGNOSIS — Z9889 Other specified postprocedural states: Secondary | ICD-10-CM | POA: Diagnosis not present

## 2020-03-02 DIAGNOSIS — I340 Nonrheumatic mitral (valve) insufficiency: Secondary | ICD-10-CM

## 2020-03-02 DIAGNOSIS — I480 Paroxysmal atrial fibrillation: Secondary | ICD-10-CM

## 2020-03-02 MED ORDER — METOPROLOL TARTRATE 25 MG PO TABS: 25.0000 mg | ORAL_TABLET | Freq: Two times a day (BID) | ORAL | 1 refills | Status: DC

## 2020-03-02 NOTE — Telephone Encounter (Signed)
New Message   Pt c/o medication issue:  1. Name of Medication: amiodarone (PACERONE) 200 MG tablet  2. How are you currently taking this medication (dosage and times per day)? 2 x daily   3. Are you having a reaction (difficulty breathing--STAT)? No   4. What is your medication issue? Pt is calling and is wondering how she should be taking this medication    Please advise

## 2020-03-02 NOTE — Telephone Encounter (Signed)
Enrolled patient for a 7 day Zio monitor to be mailed to patients home.  

## 2020-03-02 NOTE — Telephone Encounter (Signed)
Spoke to the pt and informed her that per Dr. [name], she needs to take it as written, for Amiodarone 200 mg po daily.  Informed the pt that this is confirmed by Dr. [name] who is sitting next to me.  This was already reflected in her med list.  Pt verbalized understanding and agrees with this plan.

## 2020-03-02 NOTE — Patient Instructions (Signed)
Medication Instructions:   STOP TAKING ASPIRIN NOW  INCREASE YOUR METOPROLOL TARTRATE TO 25 MG BY MOUTH TWICE DAILY  *If you need a refill on your cardiac medications before your next appointment, please call your pharmacy*   Testing/Procedures:  ZIO XT- Long Term Monitor Instructions   Your physician has requested you wear your ZIO patch monitor__7_days.   This is a single patch monitor.  [employer] supplies one patch monitor per enrollment.  Additional stickers are not available.   Please do not apply patch if you will be having a Nuclear Stress Test, Echocardiogram, Cardiac CT, MRI, or Chest Xray during the time frame you would be wearing the monitor. The patch cannot be worn during these tests.  You cannot remove and re-apply the ZIO XT patch monitor.   Your ZIO patch monitor will be sent USPS Priority mail from [facility] directly to your home address. The monitor may also be mailed to a PO BOX if home delivery is not available.   It may take 3-5 days to receive your monitor after you have been enrolled.   Once you have received you monitor, please review enclosed instructions.  Your monitor has already been registered assigning a specific monitor serial # to you.   Applying the monitor   Shave hair from upper left chest.   Hold abrader disc by orange tab.  Rub abrader in 40 strokes over left upper chest as indicated in your monitor instructions.   Clean area with 4 enclosed alcohol pads .  Use all pads to assure are is cleaned thoroughly.  Let dry.   Apply patch as indicated in monitor instructions.  Patch will be place under collarbone on left side of chest with arrow pointing upward.   Rub patch adhesive wings for 2 minutes.Remove white label marked "1".  Remove white label marked "2".  Rub patch adhesive wings for 2 additional minutes.   While looking in a mirror, press and release button in center of patch.  A small green light will flash 3-4 times .  This will be  your only indicator the monitor has been turned on.     Do not shower for the first 24 hours.  You may shower after the first 24 hours.   Press button if you feel a symptom. You will hear a small click.  Record Date, Time and Symptom in the Patient Log Book.   When you are ready to remove patch, follow instructions on last 2 pages of Patient Log Book.  Stick patch monitor onto last page of Patient Log Book.   Place Patient Log Book in [place] box.  Use locking tab on box and tape box closed securely.  The [employer] and [employer] has [employer] on it.  Please place in mailbox as soon as possible.  Your physician should have your test results approximately 7 days after the monitor has been mailed back to [facility].   Call [place] at [phone] if you have questions regarding your ZIO XT patch monitor.  Call them immediately if you see an orange light blinking on your monitor.   If your monitor falls off in less than 4 days contact our Monitor department at [phone].  If your monitor becomes loose or falls off after 4 days call [employer] at [phone] for suggestions on securing your monitor.     Follow-Up:  WITH DR. [name] IN THE OFFICE ON April 09, 2020 AT 10 AM

## 2020-03-02 NOTE — Telephone Encounter (Signed)
-----   Message from Crisoforo Oxford sent at 03/02/2020  1:57 PM EDT ----- Regarding: RE: [place] PT--READY TO START PER DR. Serita Kyle afternoon, Yes once our nurse navigator looks at her notes from today's visit, I will call and schedule her. ----- Message ----- From: Nuala Alpha, LPN Sent: 12/31/1538  08:67 PM EDT To: Nuala Alpha, LPN, Crisoforo Oxford Subject: CARDIAC REHAB FOR PT--READY TO START PER DR.#  Hey there I hope you are well.  Dr. [name] at [facility] wanted me to reach out to you on this pt about her needing to proceed with Cardiac Rehab, as previously referred.  Dr. [name] saw her in clinic today and patient has decided she wants to now proceed with scheduling Cardiac Rehab.  Can you please assist with this?    Thanks, Courtnee Myer Dr. Francesca Oman Nurse

## 2020-03-02 NOTE — Progress Notes (Signed)
Cardiology Office Note   Date:  03/02/2020   ID:  Kellie Humphrey, DOB 03/06/1946, MRN [id number]  PCP:  Marton Redwood, MD  Cardiologist:  Dr. [name]    Reason for visit: post hospital visit after mitral valve repair   History of Present Illness: Kellie Humphrey is a 74 y.o. female with h/o mitral valve prolapse with moderate to severe MR, PACs, breast cancer s/p lumpectomy and radiation in 2009, interstitial cystitis, and chronic cystitis on macrobid. She wore a monitor which revealed sinus brady to sinus tach, with freq PVCs. + PACs and SVT with 15 beats. Flecainide was started at 50 mg BID with resolution of her symptoms. TEE in October 2019 showed LVEF 60% to 65%. Moderate, holosystolic prolapse, involving the anterior leaflet and the posterior leaflet with moderate to severe regurgitation.  Stress echocardiogram on Feb 09, 2019 showed good exercise capacity, however her heart function is now 50 to 55%, her mitral regurgitation was underestimated but is at least moderate, her right-sided pressure at peak exercise were 59 mmHg. Repeat echocardiogram in September 2020 showed severe MR with jet hitting posterior wall of the left atrium, her LVEF was 60 to 65%.  03/02/2020 -the patient underwent minimally invasive mitral valve repair using Memo 4D 32 MM Mitral Valve Ring by Dr. Roxy Manns on Feb 10, 2020.  She underwent cardiac catheterization prior to surgery with no evidence for coronary artery disease.  Stop course was complicated by atrial fibrillation with quickly cardioverted after initiation of IV amiodarone but she continued to have paroxysmal A. fib over the next few days, she was sent home on amiodarone 200 mg p.o. twice daily that she is still taking.  Flecainide that was previously started for frequent PVCs and PAC was discontinued.  She is coming today, she states that she can now walk up to 15 minutes with some shortness of breath.  She has no lower extremity edema.  No orthopnea or  proximal nocturnal dyspnea.  She continues to have palpitations with irregular beats, she brings diary of it and it seems like she has an episode almost every day.  She has significant bruising around the incision area, and in her right breast, however no discharge, no erythema, no fever or chills.  She has been compliant with her meds.  Denies any falls.  Past Medical History:  Diagnosis Date  . Arthritis   . Breast cancer ([place])    s/p lumpectomy  . Cataract   . Dense breast 09/06/2013  . Dysrhythmia    PACs  . Heart murmur   . Hypothyroidism   . Malignant neoplasm of lower-outer quadrant of right breast of female, estrogen receptor positive ([place]) 02/11/2012  . Mitral valve disorder 05/08/2009   Qualifier: Diagnosis of  By: [name], RN, [name]    . MVP (mitral valve prolapse)   . Nonrheumatic mitral valve regurgitation   . PAC (premature atrial contraction)   . Palpitations   . Paresthesias 07/29/2014  . Personal history of breast cancer 09/06/2013  . PREMATURE ATRIAL CONTRACTIONS 05/08/2009   Qualifier: Diagnosis of  By: [name], RN, [name]    . S/P minimally-invasive mitral valve repair 02/10/2020   Complex valvuloplasty including artificial Gore-tex neochord placement x8 with edge-to-edge suture plication of anterior commissure and 32 mm [name] 4D ring annuloplasty via right mini thoracotomy approach  . Thyroid disease   . Tremor 12/08/2013  . Varicose veins of right lower extremity with complications 4/31/5400   Past Surgical History:  Procedure Laterality  Date  . BREAST BIOPSY  2009  . BREAST LUMPECTOMY    . CARDIAC CATHETERIZATION    . DILATION AND CURETTAGE OF UTERUS    . EYE SURGERY Left    Pterygium  . HEMANGIOMA EXCISION Left 1990   arm  . MELANOMA EXCISION Right 1980   thigh  . MITRAL VALVE REPAIR Right 02/10/2020   Procedure: MINIMALLY INVASIVE MITRAL VALVE REPAIR (MVR) using Memo 4D 32 MM Mitral Valve Ring.;  Surgeon: Rexene Alberts, MD;  Location: [facility];   Service: Open Heart Surgery;  Laterality: Right;  . NECK SURGERY  10/2009   C5-6 fusion  . RIGHT/LEFT HEART CATH AND CORONARY ANGIOGRAPHY N/A 01/04/2020   Procedure: RIGHT/LEFT HEART CATH AND CORONARY ANGIOGRAPHY;  Surgeon: Burnell Blanks, MD;  Location: [facility];  Service: Cardiovascular;  Laterality: N/A;  . SHOULDER ARTHROSCOPY    . TEE WITHOUT CARDIOVERSION N/A 06/30/2018   Procedure: TRANSESOPHAGEAL ECHOCARDIOGRAM (TEE);  Surgeon: Sueanne Margarita, MD;  Location[facility] ENDOSCOPY;  Service: Cardiovascular;  Laterality: N/A;  . TEE WITHOUT CARDIOVERSION N/A 12/12/2019   Procedure: TRANSESOPHAGEAL ECHOCARDIOGRAM (TEE);  Surgeon: Dorothy Spark, MD;  [facility] END[facility]COPY;  Service: Cardiovascular;  Laterality: N/A;  . TEE WITHOUT CARDIOVERSION N/A 02/10/2020   Procedure: TRANSESOPHAGEAL ECHOCARDIOGRAM (TEE);  Surgeon: Rexene Alberts, MD;  Location: [facility];  Service: Open Heart Surgery;  Laterality: N/A;  . TONSILLECTOMY       Current Outpatient Medications  Medication Sig Dispense Refill  . acetaminophen (TYLENOL) 500 MG tablet Take 500-1,000 mg by mouth See admin instructions. Take 500 mg at night, may take a second 500 -1000 mg dose as needed for pain    . amiodarone (PACERONE) 200 MG tablet Take 200 mg by mouth daily.    . Ascorbic Acid (VITAMIN C PO) Take 500 mg by mouth daily.    . Biotin 5000 MCG CAPS Take 5,000 mcg by mouth every evening.     . Cholecalciferol (VITAMIN D) 2000 units tablet Take 2,000 Units by mouth daily.    . Cyanocobalamin (B-12) 5000 MCG CAPS Take 5,000 mcg by mouth 2 (two) times a week. Mondays & Fridays.    . diclofenac sodium (VOLTAREN) 1 % GEL APPLY 2-4 GRAMS TO LARGE JOINT AREA UP TO FOUR TIMES A DAY AS NEEDED 2 Tube 2  . diphenhydrAMINE (BENADRYL) 25 MG tablet Take 25 mg by mouth at bedtime.     . gabapentin (NEURONTIN) 600 MG tablet Take 600 mg by mouth 2 (two) times daily.    [name] levothyroxine (SYNTHROID) 50 MCG tablet Take 1 tablet (50  mcg total) by mouth daily before breakfast.    . Melatonin 10 MG TABS Take 1 tablet by mouth as needed (sleep).    . nitrofurantoin (MACRODANTIN) 50 MG capsule Take 1 capsule (50 mg total) by mouth daily. 30 capsule 6  . prednisoLONE acetate (PRED FORTE) 1 % ophthalmic suspension Place 1 drop into the left eye 3 (three) times daily.     . SYSTANE ULTRA 0.4-0.3 % SOLN Place 1 drop into the left eye every 4 (four) hours as needed (dry/irritated eyes. (4-6 times daily)).     [name] warfarin (COUMADIN) 2.5 MG tablet Take 1 tablet (2.5 mg total) by mouth daily at 4 PM. Or as instructed by the Coumadin Clinic. 30 tablet 2  . metoprolol tartrate (LOPRESSOR) 25 MG tablet Take 1 tablet (25 mg total) by mouth 2 (two) times daily. 180 tablet 1   No current  facility-administered medications for this visit.   Allergies:   Codeine   Social History:  The patient  reports that she has never smoked. She has never used smokeless tobacco. She reports current alcohol use. She reports that she does not use drugs.  Family History:  The patient's family history includes Coronary artery disease in her mother; Fibromyalgia in her sister; Heart disease in her maternal grandmother; Hypertension in her mother; Neuropathy in her father; Osteoarthritis in her father; Osteoporosis in her mother; Other in her father; Tremor in her father.   ROS:  General:no colds or fevers, no weight changes Skin:no rashes or ulcers HEENT:no blurred vision, no congestion CV:see HPI PUL:see HPI GI:no diarrhea constipation or melena, no indigestion GU:no hematuria, no dysuria MS:no joint pain, no claudication Neuro:no syncope, no lightheadedness Endo:no diabetes, + thyroid disease  Wt Readings from Last 3 Encounters:  03/02/20 130 lb (59 kg)  02/16/20 137 lb 2 oz (62.2 kg)  02/08/20 135 lb 9.6 oz (61.5 kg)    PHYSICAL EXAM: VS:  BP 114/68   Pulse 89   Ht 5\' 7"  (1.702 m)   Wt 130 lb (59 kg)   SpO2 97%   BMI 20.36 kg/m  , BMI Body mass  index is 20.36 kg/m. General:Pleasant affect, NAD Skin:Warm and dry, brisk capillary refill HEENT:normocephalic, sclera clear, mucus membranes moist Neck:supple, no JVD, no bruits  Heart:S1,S2 RRR no murmurs, gallup, rub or click Incision in the right chest wall seems healing well, there is no discharge no erythema, there is significant bruising in her right breast and in the area about the breast. Lungs:clear without rales, rhonchi, or wheezes WIO:MBTD, non tender, + BS, do not palpate liver spleen or masses Ext:no lower ext edema, 2+ pedal pulses, 2+ radial pulses Neuro:alert and oriented X 3, MAE, follows commands, + facial symmetry  EKG:  EKG is ordered today. The ekg ordered today demonstrates normal sinus rhythm normal EKG unchanged from prior.  Personally reviewed.  Recent Labs: 02/08/2020: ALT 17 02/14/2020: Hemoglobin 9.0; Platelets 106 02/16/2020: BUN 11; Creatinine, Ser 0.69; Magnesium 1.6; Potassium 3.6; Sodium 140   Lipid Panel No results found for: CHOL, TRIG, HDL, CHOLHDL, VLDL, LDLCALC, LDLDIRECT   TTE 06/17/18 Myxomatous, moderately thickened valve with bileaflet prolapse. Mitral regurgitation jet is central and at least moderate to severe with reversal of forward flow in the pulmonary veins. However, left atrial size is normal and so are right sided pressures. A TEE is recommended for further evaluation.  Echo TEE 06/30/18 - Left ventricle: Systolic function was normal. The estimated ejection fraction was in the range of 60% to 65%. Wall motion was normal; there were no regional wall motion abnormalities. - Aortic valve: Trileaflet; mildly thickened leaflets. There was trivial regurgitation. - Mitral valve: Moderate, holosystolicprolapse, involving the anterior leaflet and the posterior leaflet. There was moderate to severe regurgitation. Effective regurgitant orifice (PISA): 0.22 cm^2. Regurgitant volume (PISA): 42 ml. - Left atrium: No  evidence of thrombus in the atrial cavity or appendage. - Right atrium: The atrium was mildly dilated. No evidence of thrombus in the atrial cavity or appendage. - Tricuspid valve: There was mild regurgitation. - Pulmonic valve: There was trivial regurgitation.  TTE: 05/2019  1. Left ventricular ejection fraction, by visual estimation, is 65 to  70%. The left ventricle has normal function. Normal left ventricular size.  There is no left ventricular hypertrophy.  2. Left ventricular diastolic Doppler parameters are consistent with  impaired relaxation pattern of LV diastolic filling.  3. Global right ventricle has normal systolic function.The right  ventricular size is normal. No increase in right ventricular wall  thickness.  4. Left atrial size was normal.  5. Right atrial size was normal.  6. Moderate to severe mitral valve regurgitation.  7. MV leaflets have myxomatous appearance There is mild prolapse of both  leaflets There is moderate to severe MR.  8. The tricuspid valve is normal in structure. Tricuspid valve  regurgitation is mild.  9. The aortic valve is tricuspid Aortic valve regurgitation was not  visualized by color flow Doppler. Mild aortic valve sclerosis without  stenosis.  10. The pulmonic valve was normal in structure. Pulmonic valve  regurgitation is mild by color flow Doppler.   ETT on flecanide    Study Highlights     Blood pressure demonstrated a normal response to exercise.  No T wave inversion was noted during stress.  Upsloping ST segment depression ST segment depression of 1 mm was noted during stress in the II, III, aVF, V6, V5 and V4 leads, and returning to baseline after less than 1 minute of recovery.  The patient experienced no angina during the stress test  Overall, the patient's exercise capacity was excellent.  D[facility] Treadmill Score: low risk  Negative stress test without evidence of ischemia at given workload.  Excellent exercise capacity. Frequent PVC's noted in early recovery.    48 hour Holter 06/09/18   Sinus bradycardia to sinus tachycardia.  Infrequent PACs and frequent PVCs (1600 in 48 hrs = 1 % of all beats).  SVT runs the longest consisting of 15 beats.   Sinus bradycardia to sinus tachycardia. Infrequent PACs and frequent PVCs (1600 in 48 hrs = 1 % of all beats). SVT runs the longest consisting of 15 beats. Symptoms correlating with PVCs.   ASSESSMENT AND PLAN:  Severe MR TTE in September 2020, status post Memo 4D 32 MM Mitral Valve Ring by Dr. Roxy Manns on Feb 10, 2020.  She is overall doing well, still has significant bruising around incision area, she is advised to use warm and cold compresses.  Advance physical activity with a goal of 30 minutes 5 times a week.  She is still waiting to be enrolled in cardiac rehab.   She appears euvolemic on physical exam.  She has repeat echocardiogram scheduled for March 26, 2020.  Paroxysmal atrial fibrillation -per patient almost every day, will start Zio patch monitoring for 7 days.  She is following with Dr. Roxy Manns on June 14.  We will reduce amiodarone dose to 200 mg daily.  Will increase metoprolol to 25 mg p.o. twice daily.  SVT, PACs and PVCs -previously on flecainide, currently on amiodarone.  We might restart flecainide once we stop amiodarone.  BP well controlled.  Current medicines are reviewed with the patient today.  The patient Has no concerns regarding medicines.  The following changes have been made:  See above Labs/ tests ordered today include:see above  Disposition:   FU:  see above  Signed, Ena Dawley, MD  03/02/2020 11:48 AM    [facility], [place], [place], [place] Phone: [phone]; Fax: [phone]

## 2020-03-06 ENCOUNTER — Ambulatory Visit (INDEPENDENT_AMBULATORY_CARE_PROVIDER_SITE_OTHER): Payer: PPO

## 2020-03-06 DIAGNOSIS — I4891 Unspecified atrial fibrillation: Secondary | ICD-10-CM | POA: Diagnosis not present

## 2020-03-06 DIAGNOSIS — Z9889 Other specified postprocedural states: Secondary | ICD-10-CM

## 2020-03-06 NOTE — Telephone Encounter (Signed)
Patient is requesting to speak with Dr. Francesca Oman nurse, Karlene Einstein, in regards to [facility] monitor. She states she would like to inquire about whether or not she needs to continue to wear monitor during echo scheduled for 03/26/20. Please advise.

## 2020-03-06 NOTE — Telephone Encounter (Signed)
Pt is calling in to ask about her zio monitor that she put on today, and having her CXR done by Dr. Guy Sandifer office on 6/14.  Pt is asking if the patch can be worn during the CXR, for she received instructions stating all monitors have to be removed in order to have a CXR done.  Pt states she just placed the monitor on today.  Informed the pt that I will ask our monitor tech if the zio patch can be worn during the CXR, and follow-up with her shortly thereafter with further advisement.  Pt verbalized understanding and agrees with this plan.

## 2020-03-06 NOTE — Telephone Encounter (Signed)
Spoke with our Monitor tech about this.  Per Joellen Jersey in Monitors, the zio patch cannot be worn during the chest x-ray, and pt should continue wearing this up til the date of her CXR, then remove and mail this back into the company.   Unfortunately, once the patch is removed, it cannot be replaced. Dr. [name] will further advise on 6 days worth of data up to that point.  Made the pt aware of this and she verbalized understanding and agrees with this plan. Will send this message to Dr. [name] as a general FYI, to make her aware of this plan.

## 2020-03-07 ENCOUNTER — Other Ambulatory Visit: Payer: Self-pay

## 2020-03-07 ENCOUNTER — Ambulatory Visit (INDEPENDENT_AMBULATORY_CARE_PROVIDER_SITE_OTHER): Payer: PPO | Admitting: *Deleted

## 2020-03-07 DIAGNOSIS — Z5181 Encounter for therapeutic drug level monitoring: Secondary | ICD-10-CM

## 2020-03-07 DIAGNOSIS — Z9889 Other specified postprocedural states: Secondary | ICD-10-CM

## 2020-03-07 LAB — POCT INR: INR: 2.4 (ref 2.0–3.0)

## 2020-03-07 NOTE — Patient Instructions (Signed)
Description   Continue on same dosage 1/2 a tablet daily except for 1 tablet on Tuesdays, Thursdays and Saturdays. Recheck INR in 1 week. Call coumadin clinic [phone],  for any changes in medications or upcoming procedures. Main Line [phone].

## 2020-03-09 ENCOUNTER — Other Ambulatory Visit: Payer: Self-pay | Admitting: Thoracic Surgery (Cardiothoracic Vascular Surgery)

## 2020-03-09 DIAGNOSIS — Z9889 Other specified postprocedural states: Secondary | ICD-10-CM

## 2020-03-12 ENCOUNTER — Ambulatory Visit (INDEPENDENT_AMBULATORY_CARE_PROVIDER_SITE_OTHER): Payer: Self-pay | Admitting: Physician Assistant

## 2020-03-12 ENCOUNTER — Encounter: Payer: Self-pay | Admitting: Physician Assistant

## 2020-03-12 ENCOUNTER — Ambulatory Visit
Admission: RE | Admit: 2020-03-12 | Discharge: 2020-03-12 | Disposition: A | Discharge status: Home / Self Care | Payer: PPO | Source: Ambulatory Visit | Attending: Thoracic Surgery (Cardiothoracic Vascular Surgery) | Admitting: Thoracic Surgery (Cardiothoracic Vascular Surgery)

## 2020-03-12 ENCOUNTER — Other Ambulatory Visit: Payer: Self-pay

## 2020-03-12 VITALS — BP 108/70 | HR 80 | Temp 97.0°F | Resp 16 | Ht 67.0 in | Wt 131.0 lb

## 2020-03-12 DIAGNOSIS — J90 Pleural effusion, not elsewhere classified: Secondary | ICD-10-CM | POA: Diagnosis not present

## 2020-03-12 DIAGNOSIS — Z9889 Other specified postprocedural states: Secondary | ICD-10-CM

## 2020-03-12 DIAGNOSIS — J939 Pneumothorax, unspecified: Secondary | ICD-10-CM | POA: Diagnosis not present

## 2020-03-12 NOTE — Patient Instructions (Addendum)
May advance activity as tolerated.  May resume driving.  Let us know if the hematoma over the right chest is showing signs of resolution over the next 2 weeks.   Follow up ECHO 6/28 as scheduled.  Follow up with Dr. Roxy Manns in 1 month.

## 2020-03-12 NOTE — Progress Notes (Signed)
HPI: Patient returns for routine postoperative follow-up having undergone mitral valve on 02/13/2019.  She was noted to have frequent PACs palpitations that was being managed with flecainide.  Postoperatively she is atrial fibrillation that was treated with decreased to 200 mg resulting in conversion back to sinus rhythm with paroxysmal atrial fibrillation.  Once the view was initiated, absolutely no neck nodes stopped.  She was seen in follow-up by Dr. [name] on 03/02/2020 and at that time was continuing to have frequent episodes of what she felt was paroxysmal atrial fibrillation of "skipped beats".  Her amiodarone was decreased to 200mg  po daily and metoprolol 25mg  PO BID was added. A ZIO patch event recorder was also ordered and Ms. Cadieux said she mailed it back in for analysis today after recording for 6 days.   She otherwise feels she is making progress. She denies shortness of breath. She is walking for about 30 minutes twice daily.   Her last INR was 2.4 on 03/07/20. She is being followed by the Coumadin Clinic and she is currently taking Coumadin 2.5mg  on Tu, Th, Sat and 1.25mg  on alternate days .    Current Outpatient Medications  Medication Sig Dispense Refill  . acetaminophen (TYLENOL) 500 MG tablet Take 500-1,000 mg by mouth See admin instructions. Take 500 mg at night, may take a second 500 -1000 mg dose as needed for pain    . amiodarone (PACERONE) 200 MG tablet Take 200 mg by mouth daily.    . Ascorbic Acid (VITAMIN C PO) Take 500 mg by mouth daily.    . Biotin 5000 MCG CAPS Take 5,000 mcg by mouth every evening.     . Cholecalciferol (VITAMIN D) 2000 units tablet Take 2,000 Units by mouth daily.    . Cyanocobalamin (B-12) 5000 MCG CAPS Take 5,000 mcg by mouth 2 (two) times a week. Mondays & Fridays.    . diclofenac sodium (VOLTAREN) 1 % GEL APPLY 2-4 GRAMS TO LARGE JOINT AREA UP TO FOUR TIMES A DAY AS NEEDED 2 Tube 2  . diphenhydrAMINE (BENADRYL) 25 MG tablet Take 25 mg by mouth at  bedtime.     . gabapentin (NEURONTIN) 600 MG tablet Take 600 mg by mouth 2 (two) times daily.    [name] levothyroxine (SYNTHROID) 50 MCG tablet Take 1 tablet (50 mcg total) by mouth daily before breakfast.    . Melatonin 10 MG TABS Take 1 tablet by mouth as needed (sleep).    . metoprolol tartrate (LOPRESSOR) 25 MG tablet Take 1 tablet (25 mg total) by mouth 2 (two) times daily. 180 tablet 1  . nitrofurantoin (MACRODANTIN) 50 MG capsule Take 1 capsule (50 mg total) by mouth daily. 30 capsule 6  . prednisoLONE acetate (PRED FORTE) 1 % ophthalmic suspension Place 1 drop into the left eye 3 (three) times daily.     . SYSTANE ULTRA 0.4-0.3 % SOLN Place 1 drop into the left eye every 4 (four) hours as needed (dry/irritated eyes. (4-6 times daily)).     [name] warfarin (COUMADIN) 2.5 MG tablet Take 1 tablet (2.5 mg total) by mouth daily at 4 PM. Or as instructed by the Coumadin Clinic. 30 tablet 2   No current facility-administered medications for this visit.    Physical Exam:  VS: BP  108/70 HR  80 RR  16 T  97.56F SaO2  99%  Heart--RRR with a single premature beat in about 30 seconds. There is no murmur.   Chest-Breath sounds are CTA. The right chest incision  is well healed. There is bruising over the right breast with some density over the upper quadrants that is consistent with a hematoma.   Exts--No peripheral edema  Diagnostic Tests:  EXAM: CHEST - 2 VIEW  COMPARISON: 02/15/2020 chest radiographs and earlier.  FINDINGS: Cardiac and mediastinal contours are within normal limits. Prosthetic mitral valve noted. Visualized tracheal air column is within normal limits. There is stable mild architectural distortion at the right lung base, but the small right side pneumothorax, and small bilateral pleural effusions appear resolved. Apical lung scarring is stable. No pulmonary edema.  Small right anterior chest wall surgical clips again noted. No acute osseous abnormality identified. Negative  visible bowel gas pattern.  IMPRESSION: 1. Resolved small right pneumothorax and bilateral pleural effusions since last month. 2. Pulmonary scarring, greater in the right lung.   Electronically Signed By: Genevie Ann M.D. On: 03/12/2020 13:53  Impression / Plan: Kellie Humphrey is 4 weeks s/p minimally invasive  mitral valve repair and is making satisfactory progress.  She had some atrial fibrillation while the hospital that was treated with amiodarone resulting in conversion to SR with occasional PAF and PAC's (shich she was having prior to surgery).  She seems to be in a sinus rhythm today by exam with an occasional skipped beat.  She has had single-patch cardiac monitoring for the past 6 days and this device has been sent in for analysis as of today.  Heart rate and BP are well-controlled on current regimen of metoprolol 25 mg p.o. twice daily and amiodarone 200 mg p.o. daily.  This is being managed by Dr. [name].  Her INR is 2.4 on 6/9/202.  No medication changes appar necessary at this time from our standpoint.  She may continue to advance her exercise routine as tolerated. She may begin driving. `The hematoma over the right breast should gradually resolve over the next several weeks.  I asked her to contact us if she is not seeing gradual improvement as described.  Follow up ECHO has been scheduled for 03/26/20.   Antony Odea, PA-C [facility] [phone]

## 2020-03-13 DIAGNOSIS — H25013 Cortical age-related cataract, bilateral: Secondary | ICD-10-CM | POA: Diagnosis not present

## 2020-03-13 DIAGNOSIS — H2513 Age-related nuclear cataract, bilateral: Secondary | ICD-10-CM | POA: Diagnosis not present

## 2020-03-13 DIAGNOSIS — H25043 Posterior subcapsular polar age-related cataract, bilateral: Secondary | ICD-10-CM | POA: Diagnosis not present

## 2020-03-13 DIAGNOSIS — H2512 Age-related nuclear cataract, left eye: Secondary | ICD-10-CM | POA: Diagnosis not present

## 2020-03-13 DIAGNOSIS — H18413 Arcus senilis, bilateral: Secondary | ICD-10-CM | POA: Diagnosis not present

## 2020-03-14 ENCOUNTER — Other Ambulatory Visit: Payer: Self-pay

## 2020-03-14 ENCOUNTER — Ambulatory Visit (INDEPENDENT_AMBULATORY_CARE_PROVIDER_SITE_OTHER): Payer: PPO | Admitting: *Deleted

## 2020-03-14 DIAGNOSIS — Z9889 Other specified postprocedural states: Secondary | ICD-10-CM | POA: Diagnosis not present

## 2020-03-14 DIAGNOSIS — Z5181 Encounter for therapeutic drug level monitoring: Secondary | ICD-10-CM | POA: Diagnosis not present

## 2020-03-14 LAB — POCT INR: INR: 2.8 (ref 2.0–3.0)

## 2020-03-14 NOTE — Patient Instructions (Signed)
Description   Continue taking Warfarin 1/2 tablet daily except for 1 tablet on Tuesdays, Thursdays and Saturdays. Recheck INR in 1 week. Call coumadin clinic [phone],  for any changes in medications or upcoming procedures. Main Line [phone].

## 2020-03-15 ENCOUNTER — Encounter (HOSPITAL_COMMUNITY): Payer: Self-pay

## 2020-03-15 NOTE — Telephone Encounter (Signed)
Attempted to call patient in regards to Cardiac Rehab - LM on VM Mailed letter 

## 2020-03-16 ENCOUNTER — Telehealth (HOSPITAL_COMMUNITY): Payer: Self-pay

## 2020-03-16 NOTE — Telephone Encounter (Signed)
Pt called to schedule for cardiac rehab, pt stated that she is having eye surgery 7/26 and that 3 weeks later she is having her other eye surgery. Advised pt to call us after her eye surgery is complete and she is able to do cardiac rehab. Pt asked how many days a week cardiac rehab was I advised pt 3 days a week, pt stated that she is unsure if she will be able to do that and that she would call back. Will close referral in the mean time. Closed referral.

## 2020-03-20 ENCOUNTER — Other Ambulatory Visit: Payer: Self-pay | Admitting: *Deleted

## 2020-03-20 MED ORDER — AMIODARONE HCL 200 MG PO TABS: 200.0000 mg | ORAL_TABLET | Freq: Every day | ORAL | 1 refills | Status: DC

## 2020-03-26 ENCOUNTER — Ambulatory Visit (INDEPENDENT_AMBULATORY_CARE_PROVIDER_SITE_OTHER): Payer: PPO | Admitting: *Deleted

## 2020-03-26 ENCOUNTER — Other Ambulatory Visit: Payer: Self-pay

## 2020-03-26 ENCOUNTER — Telehealth: Payer: Self-pay | Admitting: Cardiology

## 2020-03-26 ENCOUNTER — Ambulatory Visit (HOSPITAL_COMMUNITY): Payer: PPO | Attending: Cardiovascular Disease

## 2020-03-26 DIAGNOSIS — Z954 Presence of other heart-valve replacement: Secondary | ICD-10-CM

## 2020-03-26 DIAGNOSIS — Z9889 Other specified postprocedural states: Secondary | ICD-10-CM | POA: Diagnosis not present

## 2020-03-26 DIAGNOSIS — Z5181 Encounter for therapeutic drug level monitoring: Secondary | ICD-10-CM | POA: Diagnosis not present

## 2020-03-26 DIAGNOSIS — I340 Nonrheumatic mitral (valve) insufficiency: Secondary | ICD-10-CM | POA: Diagnosis not present

## 2020-03-26 DIAGNOSIS — I4891 Unspecified atrial fibrillation: Secondary | ICD-10-CM | POA: Diagnosis not present

## 2020-03-26 LAB — POCT INR: INR: 2.7 (ref 2.0–3.0)

## 2020-03-26 NOTE — Patient Instructions (Signed)
Description   Continue taking Warfarin 1/2 tablet daily except for 1 tablet on Tuesdays, Thursdays and Saturdays. Recheck INR in 2 weeks. Call coumadin clinic [phone],  for any changes in medications or upcoming procedures. Main Line [phone].

## 2020-03-26 NOTE — Telephone Encounter (Signed)
Her echo shows normal LVEF and normally functioning repaired mitral valve. She is ok to go to [facility]. I will be happy to switch to Flecainide, however I have to wait for the monitor results. Ena Dawley, MD

## 2020-03-26 NOTE — Telephone Encounter (Signed)
Pt is calling to inform Dr. [name] that she will be going out of town to [place] in the morning, then to [employer] thereafter. Pt states she had her echo done today. She states she sent in a mychart message to Dr. [name] earlier, which we did receive, and spoke to our [facility] about if results could be expedited sooner.  Per Darrick Penna, she contacted zio, and those results should be in within 24 hours.  Pt is s/p mitral valve repair and is otherwise doing good, but is experiencing occasional felt "skipped beats."  She inquires no other symptoms when these occur.  She states her energy level is overall well, and otherwise doing very good since her surgery.  She is concerned she may be having breakthrough afib, post op.  She states "I did better on flecainide before the surgery, then I had the surgery, and was switched to amiodarone and feel these episodes more often." She is worried that if she goes out of town tomorrow and her monitor results or echo results show there needs to be a drastic med change, then she will be too far out.  I assured the pt that the monitor company informed us that her monitor results would be back soon, and within 24 hours.  Informed her to take her meds as prescribed, and use good judgement if symptoms occur.  She is aware of close medical facilities in the area she is traveling too.  Informed her that I will route this message to Dr. [name] for further input.  Pt verbalized understanding and agrees with this plan. Pt appreciative for all the assistance provided.

## 2020-03-26 NOTE — Telephone Encounter (Signed)
New message   Patient wants to make sure ok to go out of town if there is a medication change. Please advise.

## 2020-03-26 NOTE — Telephone Encounter (Signed)
Message below per our monitor tech:   RE: status of zio results on this pt Received: Today Rock Nephew, [employer], LPN I have sent a request to [facility]. FYI she says a few expedites are taking over 24 hours.   Thanks,  Shelly    Have made Dr. [name] aware of this, via sent message.

## 2020-03-26 NOTE — Telephone Encounter (Addendum)
RE: Irregular heartbeats Received: Today Dorothy Spark, MD  Thora Lance, RN; Nuala Alpha, LPN; Benita Stabile We havent received Zio patch results yet, can it be expedited?    Message sent to our monitor Techs Darrick Penna and Joellen Jersey, to inquire the status of this pts zio monitor results that were done awhile back, and to ask them if this could be expedited in getting these results back, per Dr. [name].  Will await response from our Monitor Techs.

## 2020-03-27 DIAGNOSIS — I4891 Unspecified atrial fibrillation: Secondary | ICD-10-CM | POA: Diagnosis not present

## 2020-03-27 NOTE — Telephone Encounter (Signed)
Spoke with the pt and informed her that per Dr. [name], her echo showed normal LVEF and normally functioning repaired mitral valve, and she advised that it is safe for her to go out of town to [place].  Informed the pt that Dr. [name] said she would be glad to switch her back to flecainide, however, she would wait for her zio monitor results to come back first, before making this change.  Pt verbalized understanding and agrees with this plan. Pt was more than gracious for all the assistance provided.

## 2020-03-28 ENCOUNTER — Telehealth: Payer: Self-pay | Admitting: *Deleted

## 2020-03-28 MED ORDER — FLECAINIDE ACETATE 50 MG PO TABS
50.0000 mg | ORAL_TABLET | Freq: Two times a day (BID) | ORAL | 0 refills | Status: DC
Start: 2020-03-28 — End: 2020-03-29

## 2020-03-28 NOTE — Telephone Encounter (Signed)
-----   Message from Dorothy Spark, MD sent at 03/28/2020 11:36 AM EDT ----- Sinus rhythm to sinus tachycardia. Benign heart monitor. Patient's diary correlates with PVCs. I would discontinue amiodarone

## 2020-03-28 NOTE — Telephone Encounter (Signed)
Spoke with the pt and informed her that per Dr. [name], we will still stop her amiodarone, and proceed with starting her now back on Flecainide 50 mg po bid and we will see her as scheduled in clinic on 04/09/20, and do an EKG at that time to assess her QT interval.  Confirmed the pharmacy of choice with the pt.  Pt is in [place] at this time, and requested that her prescription be sent to [facility] in [place], [place].  Sent this in for just a month supply, and she is aware that we can send further refills to her local [facility] here in town thereafter.  Pt verbalized understanding and agrees with this plan.

## 2020-03-28 NOTE — Telephone Encounter (Signed)
Spoke with the pt and informed her that per Dr. [name], we will still stop her amiodarone, and proceed with starting her now back on Flecainide 50 mg po bid and we will see her as scheduled in clinic on 04/09/20, and do an EKG at that time to assess her QT interval.  Confirmed the pharmacy of choice with the pt.  Pt is in [place] at this time, and requested that her prescription be sent to [facility] in [place], [place].  Sent this in for just a month supply, and she is aware that we can send further refills to her local [facility] here in town thereafter.  Pt verbalized understanding and agrees with this plan.

## 2020-03-28 NOTE — Telephone Encounter (Signed)
-----   Message from Dorothy Spark, MD sent at 03/28/2020  5:22 PM EDT ----- Please d/c amiodarone, start flecainide 50 mg PO BID and have her come in in 2 weeks just for ECG (not a clinic visit) to monitor her QT/QTc length, thank you

## 2020-03-28 NOTE — Telephone Encounter (Signed)
Spoke with the pt and informed her of monitor results and recommendations per Dr. [name]. Discontinued amiodarone from her med list.  Pt states when she does experience the fluttering sensation, and becomes self aware of "extra beats," is there something she can take as needed for this.  Informed the pt that Dr. [name] is out of the office at this time.  She is aware that I will send this message back to Dr. [name] to review and advise on a PRN medication for fluttering and felt "extra beats." Informed the pt that we will follow-up with her once further advisement is received.  Pt states she has no other correlating cardiac symptoms when she experiences complaints. Pt verbalized understanding and agrees with this plan.

## 2020-03-29 ENCOUNTER — Other Ambulatory Visit: Payer: Self-pay

## 2020-03-29 MED ORDER — FLECAINIDE ACETATE 50 MG PO TABS: 50.0000 mg | ORAL_TABLET | Freq: Two times a day (BID) | ORAL | 0 refills | Status: DC

## 2020-04-03 ENCOUNTER — Other Ambulatory Visit: Payer: Self-pay

## 2020-04-03 MED ORDER — FLECAINIDE ACETATE 50 MG PO TABS: 50.0000 mg | ORAL_TABLET | Freq: Two times a day (BID) | ORAL | 3 refills | Status: DC

## 2020-04-09 ENCOUNTER — Encounter: Payer: Self-pay | Admitting: Thoracic Surgery (Cardiothoracic Vascular Surgery)

## 2020-04-09 ENCOUNTER — Ambulatory Visit (INDEPENDENT_AMBULATORY_CARE_PROVIDER_SITE_OTHER): Payer: PPO

## 2020-04-09 ENCOUNTER — Ambulatory Visit: Payer: PPO | Admitting: Cardiology

## 2020-04-09 ENCOUNTER — Ambulatory Visit (INDEPENDENT_AMBULATORY_CARE_PROVIDER_SITE_OTHER): Payer: Self-pay | Admitting: Thoracic Surgery (Cardiothoracic Vascular Surgery)

## 2020-04-09 ENCOUNTER — Encounter: Payer: Self-pay | Admitting: Cardiology

## 2020-04-09 ENCOUNTER — Other Ambulatory Visit: Payer: Self-pay

## 2020-04-09 VITALS — BP 104/70 | HR 70 | Ht 67.0 in | Wt 134.8 lb

## 2020-04-09 VITALS — BP 130/83 | HR 74 | Temp 97.7°F | Resp 20 | Ht 67.0 in | Wt 134.0 lb

## 2020-04-09 DIAGNOSIS — I480 Paroxysmal atrial fibrillation: Secondary | ICD-10-CM

## 2020-04-09 DIAGNOSIS — R002 Palpitations: Secondary | ICD-10-CM

## 2020-04-09 DIAGNOSIS — Z5181 Encounter for therapeutic drug level monitoring: Secondary | ICD-10-CM | POA: Diagnosis not present

## 2020-04-09 DIAGNOSIS — I10 Essential (primary) hypertension: Secondary | ICD-10-CM | POA: Diagnosis not present

## 2020-04-09 DIAGNOSIS — Z9889 Other specified postprocedural states: Secondary | ICD-10-CM | POA: Diagnosis not present

## 2020-04-09 DIAGNOSIS — I340 Nonrheumatic mitral (valve) insufficiency: Secondary | ICD-10-CM

## 2020-04-09 DIAGNOSIS — I493 Ventricular premature depolarization: Secondary | ICD-10-CM | POA: Diagnosis not present

## 2020-04-09 DIAGNOSIS — I4891 Unspecified atrial fibrillation: Secondary | ICD-10-CM

## 2020-04-09 LAB — CBC
Hematocrit: 37.4 % (ref 34.0–46.6)
Hemoglobin: 12.1 g/dL (ref 11.1–15.9)
MCH: 29.3 pg (ref 26.6–33.0)
MCHC: 32.4 g/dL (ref 31.5–35.7)
MCV: 91 fL (ref 79–97)
Platelets: 284 10*3/uL (ref 150–450)
RBC: 4.13 x10E6/uL (ref 3.77–5.28)
RDW: 12.9 % (ref 11.7–15.4)
WBC: 6.3 10*3/uL (ref 3.4–10.8)

## 2020-04-09 LAB — COMPREHENSIVE METABOLIC PANEL
ALT: 28 IU/L (ref 0–32)
AST: 27 IU/L (ref 0–40)
Albumin/Globulin Ratio: 1.4 (ref 1.2–2.2)
Albumin: 4.2 g/dL (ref 3.7–4.7)
Alkaline Phosphatase: 151 IU/L — ABNORMAL HIGH (ref 48–121)
BUN/Creatinine Ratio: 14 (ref 12–28)
BUN: 11 mg/dL (ref 8–27)
Bilirubin Total: 0.2 mg/dL (ref 0.0–1.2)
CO2: 25 mmol/L (ref 20–29)
Calcium: 9.3 mg/dL (ref 8.7–10.3)
Chloride: 103 mmol/L (ref 96–106)
Creatinine, Ser: 0.8 mg/dL (ref 0.57–1.00)
GFR calc Af Amer: 85 mL/min/{1.73_m2} (ref >59)
GFR calc non Af Amer: 74 mL/min/{1.73_m2} (ref >59)
Globulin, Total: 3.1 g/dL (ref 1.5–4.5)
Glucose: 82 mg/dL (ref 65–99)
Potassium: 4.2 mmol/L (ref 3.5–5.2)
Sodium: 141 mmol/L (ref 134–144)
Total Protein: 7.3 g/dL (ref 6.0–8.5)

## 2020-04-09 LAB — TSH: TSH: 2.05 u[IU]/mL (ref 0.450–4.500)

## 2020-04-09 LAB — POCT INR: INR: 2 (ref 2.0–3.0)

## 2020-04-09 NOTE — Patient Instructions (Addendum)
Continue all previous medications without any changes at this time  You may continue to gradually increase your physical activity as tolerated.  Increase the intensity and duration of physical activity gradually.  Endocarditis is a potentially serious infection of heart valves or inside lining of the heart.  It occurs more commonly in patients with diseased heart valves (such as patient's with aortic or mitral valve disease) and in patients who have undergone heart valve repair or replacement.  Certain surgical and dental procedures may put you at risk, such as dental cleaning, other dental procedures, or any surgery involving the respiratory, urinary, gastrointestinal tract, gallbladder or prostate gland.   To minimize your chances for develooping endocarditis, maintain good oral health and seek prompt medical attention for any infections involving the mouth, teeth, gums, skin or urinary tract.    Always notify your doctor or dentist about your underlying heart valve condition before having any invasive procedures. You will need to take antibiotics before certain procedures, including all routine dental cleanings or other dental procedures.  Your cardiologist or dentist should prescribe these antibiotics for you to be taken ahead of time.

## 2020-04-09 NOTE — Progress Notes (Signed)
Cardiology Office Note   Date:  04/09/2020   ID:  Kellie Humphrey, DOB 06/21/1946, MRN [id number]  PCP:  Marton Redwood, MD  Cardiologist:  Dr. [name]    Reason for visit: post hospital visit after mitral valve repair, 1 month follow-up   History of Present Illness: Kellie Humphrey is a 74 y.o. female with h/o mitral valve prolapse with moderate to severe MR, PACs, breast cancer s/p lumpectomy and radiation in 2009, interstitial cystitis, and chronic cystitis on macrobid. She wore a monitor which revealed sinus brady to sinus tach, with freq PVCs. + PACs and SVT with 15 beats. Flecainide was started at 50 mg BID with resolution of her symptoms. TEE in October 2019 showed LVEF 60% to 65%. Moderate, holosystolic prolapse, involving the anterior leaflet and the posterior leaflet with moderate to severe regurgitation.  Stress echocardiogram on Feb 09, 2019 showed good exercise capacity, however her heart function is now 50 to 55%, her mitral regurgitation was underestimated but is at least moderate, her right-sided pressure at peak exercise were 59 mmHg. Repeat echocardiogram in September 2020 showed severe MR with jet hitting posterior wall of the left atrium, her LVEF was 60 to 65%.  03/02/2020 -the patient underwent minimally invasive mitral valve repair using Memo 4D 32 MM Mitral Valve Ring by Dr. Roxy Manns on Feb 10, 2020.  She underwent cardiac catheterization prior to surgery with no evidence for coronary artery disease.  Stop course was complicated by atrial fibrillation with quickly cardioverted after initiation of IV amiodarone but she continued to have paroxysmal A. fib over the next few days, she was sent home on amiodarone 200 mg p.o. twice daily that she is still taking.  Flecainide that was previously started for frequent PVCs and PAC was discontinued.  She is coming today, she states that she can now walk up to 15 minutes with some shortness of breath.  She has no lower extremity  edema.  No orthopnea or proximal nocturnal dyspnea.  She continues to have palpitations with irregular beats, she brings diary of it and it seems like she has an episode almost every day.  She has significant bruising around the incision area, and in her right breast, however no discharge, no erythema, no fever or chills.  She has been compliant with her meds.  Denies any falls.  04/09/2020 -the patient states that she has been feeling much better, she is now able to walk 2 miles a day, with no chest pain or shortness of breath, her only shortness of breath is when she is walking stairs, her palpitations have resolved.  She was experiencing side effects with amiodarone but that has resolved with the initiation of flecainide.  Today's EKG does show stable PR and QRS interval.  She has no dizziness or falls, no lower extremity edema.  She has residual hematoma in her right chest wall including breast, this is slowly improving, bruising has resolved.  Past Medical History:  Diagnosis Date  . Arthritis   . Breast cancer ([facility])    s/p lumpectomy  . Cataract   . Dense breast 09/06/2013  . Dysrhythmia    PACs  . Heart murmur   . Hypothyroidism   . Malignant neoplasm of lower-outer quadrant of right breast of female, estrogen receptor positive ([facility]) 02/11/2012  . Mitral valve disorder 05/08/2009   Qualifier: Diagnosis of  By: [name], RN, [name]    . MVP (mitral valve prolapse)   . Nonrheumatic mitral valve regurgitation   .  PAC (premature atrial contraction)   . Palpitations   . Paresthesias 07/29/2014  . Personal history of breast cancer 09/06/2013  . PREMATURE ATRIAL CONTRACTIONS 05/08/2009   Qualifier: Diagnosis of  By: [name], RN, [name]    . S/P minimally-invasive mitral valve repair 02/10/2020   Complex valvuloplasty including artificial Gore-tex neochord placement x8 with edge-to-edge suture plication of anterior commissure and 32 mm [name] 4D ring annuloplasty via right mini thoracotomy  approach  . Thyroid disease   . Tremor 12/08/2013  . Varicose veins of right lower extremity with complications 12/12/4006   Past Surgical History:  Procedure Laterality Date  . BREAST BIOPSY  2009  . BREAST LUMPECTOMY    . CARDIAC CATHETERIZATION    . DILATION AND CURETTAGE OF UTERUS    . EYE SURGERY Left    Pterygium  . HEMANGIOMA EXCISION Left 1990   arm  . MELANOMA EXCISION Right 1980   thigh  . MITRAL VALVE REPAIR Right 02/10/2020   Procedure: MINIMALLY INVASIVE MITRAL VALVE REPAIR (MVR) using Memo 4D 32 MM Mitral Valve Ring.;  Surgeon: Rexene Alberts, MD;  Location: [facility];  Service: Open Heart Surgery;  Laterality: Right;  . NECK SURGERY  10/2009   C5-6 fusion  . RIGHT/LEFT HEART CATH AND CORONARY ANGIOGRAPHY N/A 01/04/2020   Procedure: RIGHT/LEFT HEART CATH AND CORONARY ANGIOGRAPHY;  Surgeon: Burnell Blanks, MD;  Location: [facility];  Service: Cardiovascular;  Laterality: N/A;  . SHOULDER ARTHROSCOPY    . TEE WITHOUT CARDIOVERSION N/A 06/30/2018   Procedure: TRANSESOPHAGEAL ECHOCARDIOGRAM (TEE);  Surgeon: Sueanne Margarita, MD;  Location: [facility] ENDOSCOPY;  Service: Cardiovascular;  Laterality: N/A;  . TEE WITHOUT CARDIOVERSION N/A 12/12/2019   Procedure: TRANSESOPHAGEAL ECHOCARDIOGRAM (TEE);  Surgeon: Dorothy Spark, MD;  Location: [facility] ENDOSCOPY;  Service: Cardiovascular;  Laterality: N/A;  . TEE WITHOUT CARDIOVERSION N/A 02/10/2020   Procedure: TRANSESOPHAGEAL ECHOCARDIOGRAM (TEE);  Surgeon: Rexene Alberts, MD;  Location: [facility];  Service: Open Heart Surgery;  Laterality: N/A;  . TONSILLECTOMY       Current Outpatient Medications  Medication Sig Dispense Refill  . acetaminophen (TYLENOL) 500 MG tablet Take 500-1,000 mg by mouth See admin instructions. Take 500 mg at night, may take a second 500 -1000 mg dose as needed for pain    . Ascorbic Acid (VITAMIN C PO) Take 500 mg by mouth daily.    . Biotin 5000 MCG CAPS Take 5,000 mcg by mouth every evening.     .  Cholecalciferol (VITAMIN D) 2000 units tablet Take 2,000 Units by mouth daily.    . Cyanocobalamin (B-12) 5000 MCG CAPS Take 5,000 mcg by mouth 2 (two) times a week. Mondays & Fridays.    . diclofenac sodium (VOLTAREN) 1 % GEL APPLY 2-4 GRAMS TO LARGE JOINT AREA UP TO FOUR TIMES A DAY AS NEEDED 2 Tube 2  . diphenhydrAMINE (BENADRYL) 25 MG tablet Take 25 mg by mouth at bedtime.     . flecainide (TAMBOCOR) 50 MG tablet Take 1 tablet (50 mg total) by mouth 2 (two) times daily. 180 tablet 3  . gabapentin (NEURONTIN) 600 MG tablet Take 600 mg by mouth 2 (two) times daily.    [name] levothyroxine (SYNTHROID) 50 MCG tablet Take 1 tablet (50 mcg total) by mouth daily before breakfast.    . Melatonin 10 MG TABS Take 1 tablet by mouth as needed (sleep).    . metoprolol tartrate (LOPRESSOR) 25 MG tablet Take 1 tablet (25 mg total) by  mouth 2 (two) times daily. 180 tablet 1  . nitrofurantoin (MACRODANTIN) 50 MG capsule Take 1 capsule (50 mg total) by mouth daily. 30 capsule 6  . SYSTANE ULTRA 0.4-0.3 % SOLN Place 1 drop into the left eye every 4 (four) hours as needed (dry/irritated eyes. (4-6 times daily)).     [name] warfarin (COUMADIN) 2.5 MG tablet Take 1 tablet (2.5 mg total) by mouth daily at 4 PM. Or as instructed by the [facility]. 30 tablet 2   No current facility-administered medications for this visit.   Allergies:   Codeine   Social History:  The patient  reports that she has never smoked. She has never used smokeless tobacco. She reports current alcohol use. She reports that she does not use drugs.  Family History:  The patient's family history includes Coronary artery disease in her mother; Fibromyalgia in her sister; Heart disease in her maternal grandmother; Hypertension in her mother; Neuropathy in her father; Osteoarthritis in her father; Osteoporosis in her mother; Other in her father; Tremor in her father.   ROS:  General:no colds or fevers, no weight changes Skin:no rashes or  ulcers HEENT:no blurred vision, no congestion CV:see HPI PUL:see HPI GI:no diarrhea constipation or melena, no indigestion GU:no hematuria, no dysuria MS:no joint pain, no claudication Neuro:no syncope, no lightheadedness Endo:no diabetes, + thyroid disease  Wt Readings from Last 3 Encounters:  04/09/20 134 lb 12.8 oz (61.1 kg)  03/12/20 131 lb (59.4 kg)  03/02/20 130 lb (59 kg)    PHYSICAL EXAM: VS:  BP 104/70   Pulse 70   Ht 5\' 7"  (1.702 m)   Wt 134 lb 12.8 oz (61.1 kg)   SpO2 97%   BMI 21.11 kg/m  , BMI Body mass index is 21.11 kg/m. General:Pleasant affect, NAD Skin:Warm and dry, brisk capillary refill HEENT:normocephalic, sclera clear, mucus membranes moist Neck:supple, no JVD, no bruits  Heart:S1,S2 RRR no murmurs, gallup, rub or click Incision in the right chest wall seems healing well, there is no discharge no erythema, there is significant bruising in her right breast and in the area about the breast. Lungs:clear without rales, rhonchi, or wheezes FFM:BWGY, non tender, + BS, do not palpate liver spleen or masses Ext:no lower ext edema, 2+ pedal pulses, 2+ radial pulses Neuro:alert and oriented X 3, MAE, follows commands, + facial symmetry  EKG:  EKG is ordered today. The ekg ordered today demonstrates normal sinus rhythm normal EKG unchanged from prior.  Personally reviewed.  Recent Labs: 02/08/2020: ALT 17 02/14/2020: Hemoglobin 9.0; Platelets 106 02/16/2020: BUN 11; Creatinine, Ser 0.69; Magnesium 1.6; Potassium 3.6; Sodium 140   Lipid Panel No results found for: CHOL, TRIG, HDL, CHOLHDL, VLDL, LDLCALC, LDLDIRECT   TTE 06/17/18 Myxomatous, moderately thickened valve with bileaflet prolapse. Mitral regurgitation jet is central and at least moderate to severe with reversal of forward flow in the pulmonary veins. However, left atrial size is normal and so are right sided pressures. A TEE is recommended for further evaluation.  Echo TEE 06/30/18 - Left  ventricle: Systolic function was normal. The estimated ejection fraction was in the range of 60% to 65%. Wall motion was normal; there were no regional wall motion abnormalities. - Aortic valve: Trileaflet; mildly thickened leaflets. There was trivial regurgitation. - Mitral valve: Moderate, holosystolicprolapse, involving the anterior leaflet and the posterior leaflet. There was moderate to severe regurgitation. Effective regurgitant orifice (PISA): 0.22 cm^2. Regurgitant volume (PISA): 42 ml. - Left atrium: No evidence of thrombus in the atrial  cavity or appendage. - Right atrium: The atrium was mildly dilated. No evidence of thrombus in the atrial cavity or appendage. - Tricuspid valve: There was mild regurgitation. - Pulmonic valve: There was trivial regurgitation.  TTE: 05/2019  1. Left ventricular ejection fraction, by visual estimation, is 65 to  70%. The left ventricle has normal function. Normal left ventricular size.  There is no left ventricular hypertrophy.  2. Left ventricular diastolic Doppler parameters are consistent with  impaired relaxation pattern of LV diastolic filling.  3. Global right ventricle has normal systolic function.The right  ventricular size is normal. No increase in right ventricular wall  thickness.  4. Left atrial size was normal.  5. Right atrial size was normal.  6. Moderate to severe mitral valve regurgitation.  7. MV leaflets have myxomatous appearance There is mild prolapse of both  leaflets There is moderate to severe MR.  8. The tricuspid valve is normal in structure. Tricuspid valve  regurgitation is mild.  9. The aortic valve is tricuspid Aortic valve regurgitation was not  visualized by color flow Doppler. Mild aortic valve sclerosis without  stenosis.  10. The pulmonic valve was normal in structure. Pulmonic valve  regurgitation is mild by color flow Doppler.   ETT on flecanide    Study Highlights      Blood pressure demonstrated a normal response to exercise.  No T wave inversion was noted during stress.  Upsloping ST segment depression ST segment depression of 1 mm was noted during stress in the II, III, aVF, V6, V5 and V4 leads, and returning to baseline after less than 1 minute of recovery.  The patient experienced no angina during the stress test  Overall, the patient's exercise capacity was excellent.  [facility] Treadmill Score: low risk  Negative stress test without evidence of ischemia at given workload. Excellent exercise capacity. Frequent PVC's noted in early recovery.    48 hour Holter 06/09/18   Sinus bradycardia to sinus tachycardia.  Infrequent PACs and frequent PVCs (1600 in 48 hrs = 1 % of all beats).  SVT runs the longest consisting of 15 beats.   Sinus bradycardia to sinus tachycardia. Infrequent PACs and frequent PVCs (1600 in 48 hrs = 1 % of all beats). SVT runs the longest consisting of 15 beats. Symptoms correlating with PVCs.   ASSESSMENT AND PLAN:  Severe MR TTE in September 2020, status post Memo 4D 32 MM Mitral Valve Ring by Dr. Roxy Manns on Feb 10, 2020.  She is overall doing well, bruising around the incision area has resolved, there is mild persistent hematoma.   She had repeat echocardiogram on March 26, 2020 that shows LVEF 55 to 60%, trivial mitral regurgitation and mean transmitral gradient of 7 mmHg.  Normal RVSP.    Paroxysmal atrial fibrillation -per patient almost every day, HR of 64 bpm, max HR of 125 bpm, and avg HR of 73 BPM. Few very shors runs of SVT< the longest lasting 5 beats. Ocassional PACs and PVs. No atrial fibrillation or ventricular tachycardia We have switched amiodarone back to flecainide with improvement of symptoms, she is seeing Dr. Roxy Manns this afternoon and will discuss length of Coumadin therapy and possible discontinuation after 3 months post surgery.  SVT, PACs and PVCs -we switch back to flecainide with improvement of  symptoms.  BP well controlled.  Current medicines are reviewed with the patient today.  The patient Has no concerns regarding medicines.  The following changes have been made:  See above Labs/ tests  ordered today include:see above  Disposition:   FU:  see above  Signed, Ena Dawley, MD  04/09/2020 10:46 AM    [facility], [place], [place], [place] Phone: [phone]; Fax: [phone]

## 2020-04-09 NOTE — Progress Notes (Signed)
[address]       [place] [address]             [phone]     CARDIOTHORACIC SURGERY OFFICE NOTE  Primary Cardiologist is Ena Dawley, MD PCP is Marton Redwood, MD   HPI:  Patient is a 74 year old female with mitral valve prolapse and long history of palpitations related to PVCs and PACs who returns the office today for routine follow-up status post minimally invasive mitral valve repair on Feb 10, 2020 for severe symptomatic primary mitral regurgitation.  The patient's early postoperative recovery was notable for postoperative atrial fibrillation.  Initially she was restarted on flecainide but this was later converted to amiodarone.  The patient was discharged from the hospital in sinus rhythm on warfarin anticoagulation.  Since hospital discharge she has been seen in follow-up by Dr. [name].  She has continued to have some palpitations.  Routine follow-up echocardiogram revealed normal left ventricular function with intact mitral valve repair and trivial residual mitral regurgitation.  ZIO patch was placed notable for the absence of any recurrence of atrial fibrillation.  Amiodarone has been stopped and flecainide resumed, after which time palpitations stopped.  Patient returns her office today and reports that she is doing very well.  She is walking 2 miles every day.  Her exercise tolerance is gradually improving but she is still not back to her baseline.  She denies shortness of breath.  Appetite is good.  She is sleeping well at night.  Overall she is pleased with her progress.   Current Outpatient Medications  Medication Sig Dispense Refill  . acetaminophen (TYLENOL) 500 MG tablet Take 500-1,000 mg by mouth See admin instructions. Take 500 mg at night, may take a second 500 -1000 mg dose as needed for pain    . Ascorbic Acid (VITAMIN C PO) Take 500 mg by mouth daily.    . Biotin 5000 MCG CAPS Take 5,000 mcg by mouth every evening.     . Cholecalciferol  (VITAMIN D) 2000 units tablet Take 2,000 Units by mouth daily.    . Cyanocobalamin (B-12) 5000 MCG CAPS Take 5,000 mcg by mouth 2 (two) times a week. Mondays & Fridays.    . diclofenac sodium (VOLTAREN) 1 % GEL APPLY 2-4 GRAMS TO LARGE JOINT AREA UP TO FOUR TIMES A DAY AS NEEDED 2 Tube 2  . diphenhydrAMINE (BENADRYL) 25 MG tablet Take 25 mg by mouth at bedtime.     . flecainide (TAMBOCOR) 50 MG tablet Take 1 tablet (50 mg total) by mouth 2 (two) times daily. 180 tablet 3  . gabapentin (NEURONTIN) 600 MG tablet Take 600 mg by mouth 2 (two) times daily.    [name] levothyroxine (SYNTHROID) 50 MCG tablet Take 1 tablet (50 mcg total) by mouth daily before breakfast.    . Melatonin 10 MG TABS Take 1 tablet by mouth as needed (sleep).    . metoprolol tartrate (LOPRESSOR) 25 MG tablet Take 1 tablet (25 mg total) by mouth 2 (two) times daily. 180 tablet 1  . nitrofurantoin (MACRODANTIN) 50 MG capsule Take 1 capsule (50 mg total) by mouth daily. 30 capsule 6  . SYSTANE ULTRA 0.4-0.3 % SOLN Place 1 drop into the left eye every 4 (four) hours as needed (dry/irritated eyes. (4-6 times daily)).     [name] warfarin (COUMADIN) 2.5 MG tablet Take 1 tablet (2.5 mg total) by mouth daily at 4 PM. Or as instructed by the Coumadin Clinic. 30 tablet  2   No current facility-administered medications for this visit.      Physical Exam:   BP 130/83   Pulse 74   Temp 97.7 F (36.5 C) (Skin)   Resp 20   Ht 5\' 7"  (1.702 m)   Wt 134 lb (60.8 kg)   SpO2 98% Comment: RA  BMI 20.99 kg/m   General:  Well-appearing  Chest:   Clear to auscultation  CV:   Regular rate and rhythm without murmur  Incisions:  Well-healed  Abdomen:  Soft nontender  Extremities:  Warm and well-perfused  Diagnostic Tests:   ECHOCARDIOGRAM REPORT       Patient Name:  Kellie Humphrey Date of Exam: 03/26/2020  Medical Rec #: [id number]     Height:    67.0 in  Accession #:  [id number]     Weight:    131.0 lb  Date of  Birth: Feb 05, 1946     BSA:     1.689 m  Patient Age:  67 years      BP:      114/68 mmHg  Patient Gender: F         HR:      76 bpm.  Exam Location: [address]   Procedure: 2D Echo   Indications:  Z98.89 Status post Mitral Valve Repair    History:    Patient has prior history of Echocardiogram examinations.  Mitral         Valve Disease.           Mitral Valve: 32 mm Memo 4D Mitral Valve Ring valve is  present         in the mitral position. Procedure Date: 02/10/2020.    Sonographer:  Cresenciano Lick RDCS  Referring Phys: [id number] [place]    1. The first postoperative TTE, LVEF 55-60% (previously 60-65%). Since  the last study there is new mitral valve ring with mildly elevated mean  gradient 7 mmHg and only trivial mitral regurgitation. Normal RVSP.  2. Left ventricular ejection fraction, by estimation, is 55 to 60%. The  left ventricle has normal function. The left ventricle has no regional  wall motion abnormalities. Left ventricular diastolic function could not  be evaluated.  3. Right ventricular systolic function is normal. The right ventricular  size is normal. There is normal pulmonary artery systolic pressure. The  estimated right ventricular systolic pressure is 31.5 mmHg.  4. The mitral valve has been repaired/replaced. Trivial mitral valve  regurgitation. Mild to moderate mitral stenosis. The mean mitral valve  gradient is 7.0 mmHg with average heart rate of 68 bpm. There is a 32 mm  Memo 4D Mitral Valve Ring present in the  mitral position. Procedure Date: 02/10/2020.  5. Tricuspid valve regurgitation is mild to moderate.  6. The aortic valve is normal in structure. Aortic valve regurgitation is  not visualized. No aortic stenosis is present.  7. The inferior vena cava is normal in size with greater than 50%  respiratory variability, suggesting  right atrial pressure of 3 mmHg.   FINDINGS  Left Ventricle: Left ventricular ejection fraction, by estimation, is 55  to 60%. The left ventricle has normal function. The left ventricle has no  regional wall motion abnormalities. The left ventricular internal cavity  size was normal in size. There is  no left ventricular hypertrophy. Left ventricular diastolic function  could not be evaluated due to mitral valve repair. Left ventricular  diastolic function could not be evaluated.  Right Ventricle: The right ventricular size is normal. No increase in  right ventricular wall thickness. Right ventricular systolic function is  normal. There is normal pulmonary artery systolic pressure. The tricuspid  regurgitant velocity is 2.28 m/s, and  with an assumed right atrial pressure of 3 mmHg, the estimated right  ventricular systolic pressure is 03.5 mmHg.   Left Atrium: Left atrial size was normal in size.   Right Atrium: Right atrial size was normal in size.   Pericardium: There is no evidence of pericardial effusion.   Mitral Valve: The mitral valve has been repaired/replaced. Normal mobility  of the mitral valve leaflets. Trivial mitral valve regurgitation. There is  a 32 mm Memo 4D Mitral Valve Ring present in the mitral position.  Procedure Date: 02/10/2020. Mild to  moderate mitral valve stenosis. MV peak gradient, 11.4 mmHg. The mean  mitral valve gradient is 7.0 mmHg with average heart rate of 68 bpm.   Tricuspid Valve: The tricuspid valve is normal in structure. Tricuspid  valve regurgitation is mild to moderate. No evidence of tricuspid  stenosis.   Aortic Valve: The aortic valve is normal in structure. Aortic valve  regurgitation is not visualized. No aortic stenosis is present.   Pulmonic Valve: The pulmonic valve was normal in structure. Pulmonic valve  regurgitation is mild. No evidence of pulmonic stenosis.   Aorta: The aortic root is normal in size and structure.    Venous: The inferior vena cava is normal in size with greater than 50%  respiratory variability, suggesting right atrial pressure of 3 mmHg.   IAS/Shunts: No atrial level shunt detected by color flow Doppler.     LEFT VENTRICLE  PLAX 2D  LVIDd:     4.80 cm Diastology  LVIDs:     3.30 cm LV e' lateral:  4.41 cm/s  LV PW:     0.70 cm LV E/e' lateral: 31.1  LV IVS:    0.70 cm LV e' medial:  5.45 cm/s  LVOT diam:   2.10 cm LV E/e' medial: 25.1  LV SV:     57  LV SV Index:  34  LVOT Area:   3.46 cm     RIGHT VENTRICLE       IVC  RV Basal diam: 2.80 cm   IVC diam: 1.90 cm  RV S prime:   12.20 cm/s  TAPSE (M-mode): 1.5 cm   LEFT ATRIUM       Index    RIGHT ATRIUM      Index  LA diam:    3.50 cm 2.07 cm/m RA Area:   11.10 cm  LA Vol (A2C):  50.8 ml 30.07 ml/m RA Volume:  25.60 ml 15.15 ml/m  LA Vol (A4C):  28.9 ml 17.11 ml/m  LA Biplane Vol: 38.8 ml 22.97 ml/m  AORTIC VALVE  LVOT Vmax:  75.50 cm/s  LVOT Vmean: 52.600 cm/s  LVOT VTI:  0.165 m    AORTA  Ao Root diam: 3.30 cm  Ao Asc diam: 3.30 cm   MITRAL VALVE        TRICUSPID VALVE  MV Area (PHT): 1.63 cm   TR Peak grad:  20.8 mmHg  MV Peak grad: 11.4 mmHg  TR Vmax:    228.00 cm/s  MV Mean grad: 7.0 mmHg  MV Vmax:    1.69 m/s   SHUNTS  MV Vmean:   127.0 cm/s  Systemic VTI: 0.16 m  MV Decel Time: 465 msec   Systemic Diam: 2.10 cm  MV E velocity: 137.00 cm/s  MV A velocity: 139.00 cm/s  MV E/A ratio: 0.99   Ena Dawley MD  Electronically signed by Ena Dawley MD  Signature Date/Time: 03/26/2020/10:05:35 PM      Impression:  Patient is doing well nearly 2 months status post minimally invasive mitral valve repair  Plan:  We have not recommended any change the patient's current medications.  However, I have reminded the patient that we typically stop anticoagulation using warfarin  approximately 3 months after surgery if the patient is not having atrial dysrhythmias.  I have suggested that she touch base with Dr. [name] prior to making any further changes on her own.  I have encouraged the patient to continue to gradually increase her physical activity without any particular limitations.  The patient has been reminded regarding the importance of dental hygiene and the lifelong need for antibiotic prophylaxis for all dental cleanings and other related invasive procedures.  Patient will continue to follow-up intermittently with Dr. [name].  She will return to our office next spring for routine follow-up, approximately 1 year following her surgery.  She will call and return sooner should specific problems or questions arise.     Valentina Gu. Roxy Manns, MD 04/09/2020 3:47 PM

## 2020-04-09 NOTE — Patient Instructions (Signed)
Continue taking Warfarin 1/2 tablet daily except for 1 tablet on Tuesdays, Thursdays and Saturdays. Recheck INR in 3 weeks. Call coumadin clinic [phone],  for any changes in medications or upcoming procedures. Main Line [phone].

## 2020-04-09 NOTE — Patient Instructions (Signed)
Medication Instructions:  Your physician recommends that you continue on your current medications as directed. Please refer to the Current Medication list given to you today.  *If you need a refill on your cardiac medications before your next appointment, please call your pharmacy*   Lab Work: TODAY: CMET, CBC, TSH If you have labs (blood work) drawn today and your tests are completely normal, you will receive your results only by: [name] MyChart Message (if you have MyChart) OR . A paper copy in the mail If you have any lab test that is abnormal or we need to change your treatment, we will call you to review the results.   Follow-Up: At [facility], you and your health needs are our priority.  As part of our continuing mission to provide you with exceptional heart care, we have created designated Provider Care Teams.  These Care Teams include your primary Cardiologist (physician) and Advanced Practice Providers (APPs -  Physician Assistants and Nurse Practitioners) who all work together to provide you with the care you need, when you need it.  We recommend signing up for the patient portal called "MyChart".  Sign up information is provided on this After Visit Summary.  MyChart is used to connect with patients for Virtual Visits (Telemedicine).  Patients are able to view lab/test results, encounter notes, upcoming appointments, etc.  Non-urgent messages can be sent to your provider as well.   To learn more about what you can do with MyChart, go to [email or web address].    Your next appointment:   3 month(s)  The format for your next appointment:   In Person  Provider:   You may see Ena Dawley, MD or one of the following Advanced Practice Providers on your designated Care Team:    Melina Copa, PA-C  Ermalinda Barrios, PA-C

## 2020-04-19 DIAGNOSIS — N83201 Unspecified ovarian cyst, right side: Secondary | ICD-10-CM | POA: Diagnosis not present

## 2020-04-19 DIAGNOSIS — D259 Leiomyoma of uterus, unspecified: Secondary | ICD-10-CM | POA: Diagnosis not present

## 2020-04-23 DIAGNOSIS — H2512 Age-related nuclear cataract, left eye: Secondary | ICD-10-CM | POA: Diagnosis not present

## 2020-04-23 DIAGNOSIS — Z9842 Cataract extraction status, left eye: Secondary | ICD-10-CM | POA: Diagnosis not present

## 2020-04-23 DIAGNOSIS — H5212 Myopia, left eye: Secondary | ICD-10-CM | POA: Diagnosis not present

## 2020-04-23 DIAGNOSIS — H11022 Central pterygium of left eye: Secondary | ICD-10-CM | POA: Diagnosis not present

## 2020-04-23 DIAGNOSIS — H52222 Regular astigmatism, left eye: Secondary | ICD-10-CM | POA: Diagnosis not present

## 2020-04-23 DIAGNOSIS — Z961 Presence of intraocular lens: Secondary | ICD-10-CM | POA: Diagnosis not present

## 2020-04-24 DIAGNOSIS — H2511 Age-related nuclear cataract, right eye: Secondary | ICD-10-CM | POA: Diagnosis not present

## 2020-05-01 ENCOUNTER — Other Ambulatory Visit: Payer: Self-pay

## 2020-05-01 ENCOUNTER — Ambulatory Visit (INDEPENDENT_AMBULATORY_CARE_PROVIDER_SITE_OTHER): Payer: PPO | Admitting: *Deleted

## 2020-05-01 DIAGNOSIS — Z9889 Other specified postprocedural states: Secondary | ICD-10-CM | POA: Diagnosis not present

## 2020-05-01 DIAGNOSIS — Z5181 Encounter for therapeutic drug level monitoring: Secondary | ICD-10-CM | POA: Diagnosis not present

## 2020-05-01 LAB — POCT INR: INR: 1.4 — AB (ref 2.0–3.0)

## 2020-05-01 NOTE — Patient Instructions (Signed)
Description   Take 1.5 tablets today and 1 tablet tomorrow, then continue to take 1/2  a tablet daily except for 1 tablet on Tuesday, Thursday and Saturday. Recheck INR in 1 week. Call coumadin clinic [phone],  for any changes in medications or upcoming procedures. Main Line [phone].

## 2020-05-03 ENCOUNTER — Telehealth: Payer: Self-pay | Admitting: *Deleted

## 2020-05-03 NOTE — Telephone Encounter (Signed)
Saw pt at her visit on Tuesday 8/3. At visit pt asked about being able to come off her warfarin because she had been told by her Dr. that after 3 months after surgery pt's can typically come off warfarin. Messaged Dr. [name] to advise. Dr. Radford Pax covering for Dr. [name] stated that pt would need to schedule an appointment with Dr. [name] to decide. Called and updated pt. Pt coming in on 8/10 to have INR checked.

## 2020-05-07 ENCOUNTER — Telehealth: Payer: Self-pay | Admitting: *Deleted

## 2020-05-07 DIAGNOSIS — L720 Epidermal cyst: Secondary | ICD-10-CM | POA: Diagnosis not present

## 2020-05-07 DIAGNOSIS — Z85828 Personal history of other malignant neoplasm of skin: Secondary | ICD-10-CM | POA: Diagnosis not present

## 2020-05-07 DIAGNOSIS — Z85820 Personal history of malignant melanoma of skin: Secondary | ICD-10-CM | POA: Diagnosis not present

## 2020-05-07 NOTE — Telephone Encounter (Signed)
Lutricia Feil, RN  Dorothy Spark, MD; Nuala Alpha, LPN I saw pt today for a coumadin visit. Pt wondering when she would be able to stop warfarin? Pt had a mitral valve repair back in May and was told anticoagulation can typically be stopped 3 months post op. Pt also has history of Afib that developed after her procedure. Please advise.   Thank you,  Ebony Hail

## 2020-05-08 ENCOUNTER — Other Ambulatory Visit: Payer: Self-pay

## 2020-05-08 ENCOUNTER — Ambulatory Visit (INDEPENDENT_AMBULATORY_CARE_PROVIDER_SITE_OTHER): Payer: PPO

## 2020-05-08 DIAGNOSIS — I4891 Unspecified atrial fibrillation: Secondary | ICD-10-CM

## 2020-05-08 DIAGNOSIS — Z5181 Encounter for therapeutic drug level monitoring: Secondary | ICD-10-CM | POA: Diagnosis not present

## 2020-05-08 DIAGNOSIS — Z9889 Other specified postprocedural states: Secondary | ICD-10-CM

## 2020-05-08 LAB — POCT INR: INR: 1.6 — AB (ref 2.0–3.0)

## 2020-05-08 NOTE — Telephone Encounter (Signed)
Needs followup appt with Dr. [name] before stopping anticoagulation

## 2020-05-08 NOTE — Telephone Encounter (Signed)
Spoke with the pt and informed her that she should remain on anticoagulation until her next OV with Dr. [name] on 07/04/20.  Informed the pt that per Dr. Radford Pax, covering for Dr. [name], she should see Dr. [name] at her scheduled OV on 10/6, before stopping anticoagulation. Pt verbalized understanding and agrees with this plan.

## 2020-05-08 NOTE — Patient Instructions (Signed)
Description   Take 1.5 tablets today and tomorrow, then start taking 1 tablet daily except 1/2 tablet on Mondays and Fridays. Recheck INR in 1 week. Discontinued Amiodarone on 03/28/20. Call coumadin clinic [phone],  for any changes in medications or upcoming procedures. Main Line [phone].

## 2020-05-14 DIAGNOSIS — Z961 Presence of intraocular lens: Secondary | ICD-10-CM | POA: Diagnosis not present

## 2020-05-14 DIAGNOSIS — H2511 Age-related nuclear cataract, right eye: Secondary | ICD-10-CM | POA: Diagnosis not present

## 2020-05-15 ENCOUNTER — Other Ambulatory Visit: Payer: Self-pay

## 2020-05-15 ENCOUNTER — Ambulatory Visit (INDEPENDENT_AMBULATORY_CARE_PROVIDER_SITE_OTHER): Payer: PPO

## 2020-05-15 DIAGNOSIS — Z9889 Other specified postprocedural states: Secondary | ICD-10-CM

## 2020-05-15 DIAGNOSIS — Z5181 Encounter for therapeutic drug level monitoring: Secondary | ICD-10-CM

## 2020-05-15 DIAGNOSIS — I4891 Unspecified atrial fibrillation: Secondary | ICD-10-CM

## 2020-05-15 LAB — POCT INR: INR: 1.7 — AB (ref 2.0–3.0)

## 2020-05-15 NOTE — Patient Instructions (Signed)
Description   Take 1.5 tablets today and tomorrow, then start taking 1 tablet daily.  Recheck INR in 1 week. Discontinued Amiodarone on 03/28/20. Call coumadin clinic [phone],  for any changes in medications or upcoming procedures. Main Line [phone].

## 2020-05-22 ENCOUNTER — Ambulatory Visit (INDEPENDENT_AMBULATORY_CARE_PROVIDER_SITE_OTHER): Payer: PPO

## 2020-05-22 ENCOUNTER — Other Ambulatory Visit: Payer: Self-pay

## 2020-05-22 DIAGNOSIS — Z9889 Other specified postprocedural states: Secondary | ICD-10-CM | POA: Diagnosis not present

## 2020-05-22 DIAGNOSIS — Z5181 Encounter for therapeutic drug level monitoring: Secondary | ICD-10-CM

## 2020-05-22 DIAGNOSIS — I4891 Unspecified atrial fibrillation: Secondary | ICD-10-CM | POA: Diagnosis not present

## 2020-05-22 LAB — POCT INR: INR: 2.1 (ref 2.0–3.0)

## 2020-05-22 MED ORDER — WARFARIN SODIUM 2.5 MG PO TABS: ORAL_TABLET | ORAL | 0 refills | Status: DC

## 2020-05-22 NOTE — Patient Instructions (Signed)
Description   Start taking 1 tablet daily except 1.5 tablets on Wednesdays.  Recheck INR in 1 week. Discontinued Amiodarone on 03/28/20. Call coumadin clinic [phone],  for any changes in medications or upcoming procedures. Main Line [phone].

## 2020-05-28 ENCOUNTER — Telehealth: Payer: Self-pay | Admitting: Pharmacist

## 2020-05-28 ENCOUNTER — Telehealth: Payer: Self-pay | Admitting: Cardiology

## 2020-05-28 DIAGNOSIS — Z20822 Contact with and (suspected) exposure to covid-19: Secondary | ICD-10-CM | POA: Diagnosis not present

## 2020-05-28 NOTE — Telephone Encounter (Signed)
Patient called and spoke with scheduler that she had a COVID exposure on Friday. No symptoms, is fully vaccinated. Advised scheduler to tell her that we need to cancel apt for tomorrow. She should get tested today- recommended [facility] or [facility]. We will get her in this week once neg test results.

## 2020-05-28 NOTE — Telephone Encounter (Signed)
Patient wants to speak with someone in the coumadin clinic in regards to her appt tomorrow that has now been cancelled. Patient states she was exposed to covid but is on a 1 week coumadin check. Please advise.

## 2020-05-28 NOTE — Telephone Encounter (Signed)
Spoke with patient. Although she was masked during the exposure, we would still like to have a negative test. [facility] has appointment tonight [facility]. Advised once those results come back to call us and we will get her back on the schedule.

## 2020-05-29 ENCOUNTER — Other Ambulatory Visit: Payer: Self-pay

## 2020-05-29 ENCOUNTER — Ambulatory Visit (INDEPENDENT_AMBULATORY_CARE_PROVIDER_SITE_OTHER): Payer: PPO

## 2020-05-29 DIAGNOSIS — Z9889 Other specified postprocedural states: Secondary | ICD-10-CM | POA: Diagnosis not present

## 2020-05-29 DIAGNOSIS — I4891 Unspecified atrial fibrillation: Secondary | ICD-10-CM

## 2020-05-29 DIAGNOSIS — Z5181 Encounter for therapeutic drug level monitoring: Secondary | ICD-10-CM | POA: Diagnosis not present

## 2020-05-29 LAB — POCT INR: INR: 2 (ref 2.0–3.0)

## 2020-05-29 NOTE — Patient Instructions (Signed)
Description   Continue on same dosage 1 tablet daily except 1.5 tablets on Wednesdays.  Recheck INR in 2 weeks. Discontinued Amiodarone on 03/28/20. Call coumadin clinic [phone],  for any changes in medications or upcoming procedures. Main Line [phone].

## 2020-06-12 ENCOUNTER — Ambulatory Visit (INDEPENDENT_AMBULATORY_CARE_PROVIDER_SITE_OTHER): Payer: PPO | Admitting: *Deleted

## 2020-06-12 ENCOUNTER — Other Ambulatory Visit: Payer: Self-pay

## 2020-06-12 DIAGNOSIS — Z9889 Other specified postprocedural states: Secondary | ICD-10-CM

## 2020-06-12 DIAGNOSIS — Z5181 Encounter for therapeutic drug level monitoring: Secondary | ICD-10-CM

## 2020-06-12 DIAGNOSIS — I4891 Unspecified atrial fibrillation: Secondary | ICD-10-CM

## 2020-06-12 LAB — POCT INR: INR: 2 (ref 2.0–3.0)

## 2020-06-12 NOTE — Patient Instructions (Signed)
Description   Continue taking Warfarin 1 tablet daily except 1.5 tablets on Wednesdays.  Recheck INR in 3 weeks with MD appt. Discontinued Amiodarone on 03/28/20. Call coumadin clinic [phone],  for any changes in medications or upcoming procedures. Main Line [phone].

## 2020-06-22 ENCOUNTER — Other Ambulatory Visit: Payer: Self-pay | Admitting: Obstetrics & Gynecology

## 2020-06-22 DIAGNOSIS — Z1231 Encounter for screening mammogram for malignant neoplasm of breast: Secondary | ICD-10-CM

## 2020-07-04 ENCOUNTER — Ambulatory Visit: Payer: PPO | Admitting: Cardiology

## 2020-07-04 ENCOUNTER — Other Ambulatory Visit: Payer: Self-pay

## 2020-07-04 VITALS — BP 98/62 | HR 74 | Ht 67.0 in | Wt 138.0 lb

## 2020-07-04 DIAGNOSIS — I493 Ventricular premature depolarization: Secondary | ICD-10-CM

## 2020-07-04 DIAGNOSIS — Z5181 Encounter for therapeutic drug level monitoring: Secondary | ICD-10-CM | POA: Diagnosis not present

## 2020-07-04 DIAGNOSIS — I480 Paroxysmal atrial fibrillation: Secondary | ICD-10-CM

## 2020-07-04 DIAGNOSIS — I340 Nonrheumatic mitral (valve) insufficiency: Secondary | ICD-10-CM

## 2020-07-04 DIAGNOSIS — Z9889 Other specified postprocedural states: Secondary | ICD-10-CM

## 2020-07-04 MED ORDER — ASPIRIN EC 81 MG PO TBEC: 81.0000 mg | DELAYED_RELEASE_TABLET | Freq: Every day | ORAL | 3 refills | Status: DC

## 2020-07-04 MED ORDER — METOPROLOL TARTRATE 25 MG PO TABS: 12.5000 mg | ORAL_TABLET | Freq: Two times a day (BID) | ORAL | 3 refills | Status: DC

## 2020-07-04 NOTE — Progress Notes (Signed)
Cardiology Office Note  Date:  07/04/2020   ID:  Kellie Humphrey, DOB May 13, 1946, MRN [id number]  PCP:  Kellie Redwood, MD  Cardiologist:  Dr. Meda Humphrey    Reason for visit: 3 months follow-up after mitral valve repair   History of Present Illness: Kellie Humphrey is a 74 y.o. female with h/o mitral valve prolapse with moderate to severe MR, PACs, breast cancer s/p lumpectomy and radiation in 2009, interstitial cystitis, and chronic cystitis on macrobid. She wore a monitor which revealed sinus brady to sinus tach, with freq PVCs. + PACs and SVT with 15 beats. Flecainide was started at 50 mg BID with resolution of her symptoms. TEE in October 2019 showed LVEF 60% to 65%. Moderate, holosystolic prolapse, involving the anterior leaflet and the posterior leaflet with moderate to severe regurgitation.  Stress echocardiogram on Feb 09, 2019 showed good exercise capacity, however her heart function is now 50 to 55%, her mitral regurgitation was underestimated but is at least moderate, her right-sided pressure at peak exercise were 59 mmHg. Repeat echocardiogram in September 2020 showed severe MR with jet hitting posterior wall of the left atrium, her LVEF was 60 to 65%.  03/02/2020 -the patient underwent minimally invasive mitral valve repair using Memo 4D 32 MM Mitral Valve Ring by Dr. Roxy Manns on Feb 10, 2020.  She underwent cardiac catheterization prior to surgery with no evidence for coronary artery disease.  Stop course was complicated by atrial fibrillation with quickly cardioverted after initiation of IV amiodarone but she continued to have paroxysmal A. fib over the next few days, she was sent home on amiodarone 200 mg p.o. twice daily that she is still taking.  Flecainide that was previously started for frequent PVCs and PAC was discontinued.  She is coming today, she states that she can now walk up to 15 minutes with some shortness of breath.  She has no lower extremity edema.  No orthopnea or  proximal nocturnal dyspnea.  She continues to have palpitations with irregular beats, she brings diary of it and it seems like she has an episode almost every day.  She has significant bruising around the incision area, and in her right breast, however no discharge, no erythema, no fever or chills.  She has been compliant with her meds.  Denies any falls.  04/09/2020 -the patient states that she has been feeling much better, she is now able to walk 2 miles a day, with no chest pain or shortness of breath, her only shortness of breath is when she is walking stairs, her palpitations have resolved.  She was experiencing side effects with amiodarone but that has resolved with the initiation of flecainide.  Today's EKG does show stable PR and QRS interval.  She has no dizziness or falls, no lower extremity edema.  She has residual hematoma in her right chest wall including breast, this is slowly improving, bruising has resolved.  07/04/2020 -the patient is feeling and looking great, she has been walking 3 miles every day without any symptoms of chest pain or shortness of breath, no palpitations, no syncope or falls.  No bleeding.  Her only concern is that when she pushes through the third mile she gets slightly dizzy and sees sparkles in front of her eyes.  Past Medical History:  Diagnosis Date  . Arthritis   . Breast cancer ([place])    s/p lumpectomy  . Cataract   . Dense breast 09/06/2013  . Dysrhythmia    PACs  . Heart  murmur   . Hypothyroidism   . Malignant neoplasm of lower-outer quadrant of right breast of female, estrogen receptor positive ([place]) 02/11/2012  . Mitral valve disorder 05/08/2009   Qualifier: Diagnosis of  By: Kellie Fillers, RN, Kellie Humphrey    . MVP (mitral valve prolapse)   . Nonrheumatic mitral valve regurgitation   . PAC (premature atrial contraction)   . Palpitations   . Paresthesias 07/29/2014  . Personal history of breast cancer 09/06/2013  . PREMATURE ATRIAL CONTRACTIONS 05/08/2009    Qualifier: Diagnosis of  By: Kellie Fillers, RN, Kellie Humphrey    . S/P minimally-invasive mitral valve repair 02/10/2020   Complex valvuloplasty including artificial Gore-tex neochord placement x8 with edge-to-edge suture plication of anterior commissure and 32 mm [name] 4D ring annuloplasty via right mini thoracotomy approach  . Thyroid disease   . Tremor 12/08/2013  . Varicose veins of right lower extremity with complications 2/68/3419   Past Surgical History:  Procedure Laterality Date  . BREAST BIOPSY  2009  . BREAST LUMPECTOMY    . CARDIAC CATHETERIZATION    . DILATION AND CURETTAGE OF UTERUS    . EYE SURGERY Left    Pterygium  . HEMANGIOMA EXCISION Left 1990   arm  . MELANOMA EXCISION Right 1980   thigh  . MITRAL VALVE REPAIR Right 02/10/2020   Procedure: MINIMALLY INVASIVE MITRAL VALVE REPAIR (MVR) using Memo 4D 32 MM Mitral Valve Ring.;  Surgeon: Kellie Alberts, MD;  Location: [facility];  Service: Open Heart Surgery;  Laterality: Right;  . NECK SURGERY  10/2009   C5-6 fusion  . RIGHT/LEFT HEART CATH AND CORONARY ANGIOGRAPHY N/A 01/04/2020   Procedure: RIGHT/LEFT HEART CATH AND CORONARY ANGIOGRAPHY;  Surgeon: Kellie Blanks, MD;  Location: [facility];  Service: Cardiovascular;  Laterality: N/A;  . SHOULDER ARTHROSCOPY    . TEE WITHOUT CARDIOVERSION N/A 06/30/2018   Procedure: TRANSESOPHAGEAL ECHOCARDIOGRAM (TEE);  Surgeon: Kellie Margarita, MD;  Location: [facility] ENDOSCOPY;  Service: Cardiovascular;  Laterality: N/A;  . TEE WITHOUT CARDIOVERSION N/A 12/12/2019   Procedure: TRANSESOPHAGEAL ECHOCARDIOGRAM (TEE);  Surgeon: Kellie Spark, MD;  Location[facility] ENDOSCOPY;  Service: Cardiovascular;  Laterality: N/A;  . TEE WITHOUT CARDIOVERSION N/A 02/10/2020   Procedure: TRANSESOPHAGEAL ECHOCARDIOGRAM (TEE);  Surgeon: Kellie Alberts, MD;  Location: [facility];  Service: Open Heart Surgery;  Laterality: N/A;  . TONSILLECTOMY       Current Outpatient Medications  Medication Sig Dispense  Refill  . acetaminophen (TYLENOL) 500 MG tablet Take 500-1,000 mg by mouth See admin instructions. Take 500 mg at night, may take a second 500 -1000 mg dose as needed for pain    . Ascorbic Acid (VITAMIN C PO) Take 500 mg by mouth daily.    . Biotin 5000 MCG CAPS Take 5,000 mcg by mouth every evening.     . Cholecalciferol (VITAMIN D) 2000 units tablet Take 2,000 Units by mouth daily.    . Cyanocobalamin (B-12) 5000 MCG CAPS Take 5,000 mcg by mouth 2 (two) times a week. Mondays & Fridays.    . diclofenac sodium (VOLTAREN) 1 % GEL APPLY 2-4 GRAMS TO LARGE JOINT AREA UP TO FOUR TIMES A DAY AS NEEDED 2 Tube 2  . diphenhydrAMINE (BENADRYL) 25 MG tablet Take 25 mg by mouth at bedtime.     . flecainide (TAMBOCOR) 50 MG tablet Take 1 tablet (50 mg total) by mouth 2 (two) times daily. 180 tablet 3  . gabapentin (NEURONTIN) 600 MG tablet Take 600 mg by mouth  2 (two) times daily.    [name] levothyroxine (SYNTHROID) 50 MCG tablet Take 1 tablet (50 mcg total) by mouth daily before breakfast.    . Melatonin 10 MG TABS Take 1 tablet by mouth as needed (sleep).    . nitrofurantoin (MACRODANTIN) 50 MG capsule Take 1 capsule (50 mg total) by mouth daily. 30 capsule 6  . SYSTANE ULTRA 0.4-0.3 % SOLN Place 1 drop into the left eye every 4 (four) hours as needed (dry/irritated eyes. (4-6 times daily)).     [name] aspirin EC 81 MG tablet Take 1 tablet (81 mg total) by mouth daily. Swallow whole. 90 tablet 3  . metoprolol tartrate (LOPRESSOR) 25 MG tablet Take 0.5 tablets (12.5 mg total) by mouth 2 (two) times daily. 90 tablet 3   No current facility-administered medications for this visit.   Allergies:   Codeine   Social History:  The patient  reports that she has never smoked. She has never used smokeless tobacco. She reports current alcohol use. She reports that she does not use drugs.  Family History:  The patient's family history includes Coronary artery disease in her mother; Fibromyalgia in her sister; Heart disease in  her maternal grandmother; Hypertension in her mother; Neuropathy in her father; Osteoarthritis in her father; Osteoporosis in her mother; Other in her father; Tremor in her father.   ROS:  General:no colds or fevers, no weight changes Skin:no rashes or ulcers HEENT:no blurred vision, no congestion CV:see HPI PUL:see HPI GI:no diarrhea constipation or melena, no indigestion GU:no hematuria, no dysuria MS:no joint pain, no claudication Neuro:no syncope, no lightheadedness Endo:no diabetes, + thyroid disease  Wt Readings from Last 3 Encounters:  07/04/20 138 lb (62.6 kg)  04/09/20 134 lb (60.8 kg)  04/09/20 134 lb 12.8 oz (61.1 kg)    PHYSICAL EXAM: VS:  BP 98/62   Pulse 74   Ht 5\' 7"  (1.702 m)   Wt 138 lb (62.6 kg)   SpO2 98%   BMI 21.61 kg/m  , BMI Body mass index is 21.61 kg/m. General:Pleasant affect, NAD Skin:Warm and dry, brisk capillary refill HEENT:normocephalic, sclera clear, mucus membranes moist Neck:supple, no JVD, no bruits  Heart:S1,S2 RRR no murmurs, gallup, rub or click Incision in the right chest wall seems healing well, there is no discharge no erythema, there is significant bruising in her right breast and in the area about the breast. Lungs:clear without rales, rhonchi, or wheezes EHM:CNOB, non tender, + BS, do not palpate liver spleen or masses Ext:no lower ext edema, 2+ pedal pulses, 2+ radial pulses Neuro:alert and oriented X 3, MAE, follows commands, + facial symmetry  EKG:  EKG is ordered today. The ekg ordered today demonstrates normal sinus rhythm normal EKG unchanged from prior.  Personally reviewed.  Recent Labs: 02/16/2020: Magnesium 1.6 04/09/2020: ALT 28; BUN 11; Creatinine, Ser 0.80; Hemoglobin 12.1; Platelets 284; Potassium 4.2; Sodium 141; TSH 2.050   Lipid Panel No results found for: CHOL, TRIG, HDL, CHOLHDL, VLDL, LDLCALC, LDLDIRECT   TTE 06/17/18 Myxomatous, moderately thickened valve with bileaflet prolapse. Mitral regurgitation jet  is central and at least moderate to severe with reversal of forward flow in the pulmonary veins. However, left atrial size is normal and so are right sided pressures. A TEE is recommended for further evaluation.  Echo TEE 06/30/18 - Left ventricle: Systolic function was normal. The estimated ejection fraction was in the range of 60% to 65%. Wall motion was normal; there were no regional wall motion abnormalities. - Aortic valve:  Trileaflet; mildly thickened leaflets. There was trivial regurgitation. - Mitral valve: Moderate, holosystolicprolapse, involving the anterior leaflet and the posterior leaflet. There was moderate to severe regurgitation. Effective regurgitant orifice (PISA): 0.22 cm^2. Regurgitant volume (PISA): 42 ml. - Left atrium: No evidence of thrombus in the atrial cavity or appendage. - Right atrium: The atrium was mildly dilated. No evidence of thrombus in the atrial cavity or appendage. - Tricuspid valve: There was mild regurgitation. - Pulmonic valve: There was trivial regurgitation.  TTE: 05/2019  1. Left ventricular ejection fraction, by visual estimation, is 65 to  70%. The left ventricle has normal function. Normal left ventricular size.  There is no left ventricular hypertrophy.  2. Left ventricular diastolic Doppler parameters are consistent with  impaired relaxation pattern of LV diastolic filling.  3. Global right ventricle has normal systolic function.The right  ventricular size is normal. No increase in right ventricular wall  thickness.  4. Left atrial size was normal.  5. Right atrial size was normal.  6. Moderate to severe mitral valve regurgitation.  7. MV leaflets have myxomatous appearance There is mild prolapse of both  leaflets There is moderate to severe MR.  8. The tricuspid valve is normal in structure. Tricuspid valve  regurgitation is mild.  9. The aortic valve is tricuspid Aortic valve regurgitation was  not  visualized by color flow Doppler. Mild aortic valve sclerosis without  stenosis.  10. The pulmonic valve was normal in structure. Pulmonic valve  regurgitation is mild by color flow Doppler.   ETT on flecanide    Study Highlights     Blood pressure demonstrated a normal response to exercise.  No T wave inversion was noted during stress.  Upsloping ST segment depression ST segment depression of 1 mm was noted during stress in the II, III, aVF, V6, V5 and V4 leads, and returning to baseline after less than 1 minute of recovery.  The patient experienced no angina during the stress test  Overall, the patient's exercise capacity was excellent.  Duke Treadmill Score: low risk  Negative stress test without evidence of ischemia at given workload. Excellent exercise capacity. Frequent PVC's noted in early recovery.    48 hour Holter 06/09/18   Sinus bradycardia to sinus tachycardia.  Infrequent PACs and frequent PVCs (1600 in 48 hrs = 1 % of all beats).  SVT runs the longest consisting of 15 beats.   Sinus bradycardia to sinus tachycardia. Infrequent PACs and frequent PVCs (1600 in 48 hrs = 1 % of all beats). SVT runs the longest consisting of 15 beats. Symptoms correlating with PVCs.   ASSESSMENT AND PLAN:  Severe MR TTE in September 2020, status post Memo 4D 32 MM Mitral Valve Ring by Dr. Roxy Manns on Feb 10, 2020.  She is overall doing well, bruising around the incision area has resolved, there is mild persistent hematoma.   She had repeat echocardiogram on March 26, 2020 that shows LVEF 55 to 60%, trivial mitral regurgitation and mean transmitral gradient of 7 mmHg.  Normal RVSP.   She is doing great, functional class I.  Paroxysmal atrial fibrillation -this has resolved.  She remains in sinus rhythm, we will discontinue Coumadin, start baby aspirin 81 mg daily, continue flecainide, and decrease the dose of metoprolol to 12.5 mg p.o. twice daily as she is having signs of  exertional hypotension.  She is hypotensive at baseline.  SVT, PACs and PVCs -we switch back to flecainide with improvement of symptoms.  BP rather low, will  decrease the dose of metoprolol.  She needs a low-dose as she is also on flecainide.  Current medicines are reviewed with the patient today.  The patient Has no concerns regarding medicines.  The following changes have been made:  See above Labs/ tests ordered today include:see above  Disposition:   FU: In 4 months.  Signed, Ena Dawley, MD  07/04/2020 12:58 PM    [address], [place], [place] Phone: [phone]; Fax: [phone]

## 2020-07-04 NOTE — Patient Instructions (Addendum)
Medication Instructions:   STOP TAKING COUMADIN NOW  START TAKING ASPIRIN 81 MG BY MOUTH DAILY  DECREASE YOUR METOPROLOL TARTRATE TO 12.5 MG BY MOUTH TWICE DAILY  CONTINUE TAKING FLECAINIDE 50 MG BY MOUTH TWICE DAILY  *If you need a refill on your cardiac medications before your next appointment, please call your pharmacy*   Follow-Up:  4 MONTHS IN THE OFFICE WITH DR. NELSON--SCHEDULING YOU CAN ADD TO DR. Frances[facility] FOR 11/06/20 AT EITHER HER 8:40 AM OR 1:40 PM SLOT--OK TO ADD PER DR. [name]

## 2020-07-07 DIAGNOSIS — Z23 Encounter for immunization: Secondary | ICD-10-CM | POA: Diagnosis not present

## 2020-07-09 ENCOUNTER — Ambulatory Visit
Admission: RE | Admit: 2020-07-09 | Discharge: 2020-07-09 | Disposition: A | Discharge status: Home / Self Care | Payer: PPO | Source: Ambulatory Visit | Attending: Obstetrics & Gynecology | Admitting: Obstetrics & Gynecology

## 2020-07-09 ENCOUNTER — Other Ambulatory Visit: Payer: Self-pay

## 2020-07-09 DIAGNOSIS — Z1231 Encounter for screening mammogram for malignant neoplasm of breast: Secondary | ICD-10-CM

## 2020-07-18 DIAGNOSIS — Z85820 Personal history of malignant melanoma of skin: Secondary | ICD-10-CM | POA: Diagnosis not present

## 2020-07-18 DIAGNOSIS — L814 Other melanin hyperpigmentation: Secondary | ICD-10-CM | POA: Diagnosis not present

## 2020-07-18 DIAGNOSIS — D2262 Melanocytic nevi of left upper limb, including shoulder: Secondary | ICD-10-CM | POA: Diagnosis not present

## 2020-07-18 DIAGNOSIS — L570 Actinic keratosis: Secondary | ICD-10-CM | POA: Diagnosis not present

## 2020-07-18 DIAGNOSIS — D2261 Melanocytic nevi of right upper limb, including shoulder: Secondary | ICD-10-CM | POA: Diagnosis not present

## 2020-07-18 DIAGNOSIS — D225 Melanocytic nevi of trunk: Secondary | ICD-10-CM | POA: Diagnosis not present

## 2020-07-18 DIAGNOSIS — Z85828 Personal history of other malignant neoplasm of skin: Secondary | ICD-10-CM | POA: Diagnosis not present

## 2020-07-18 DIAGNOSIS — D1801 Hemangioma of skin and subcutaneous tissue: Secondary | ICD-10-CM | POA: Diagnosis not present

## 2020-07-18 DIAGNOSIS — D485 Neoplasm of uncertain behavior of skin: Secondary | ICD-10-CM | POA: Diagnosis not present

## 2020-07-18 DIAGNOSIS — L821 Other seborrheic keratosis: Secondary | ICD-10-CM | POA: Diagnosis not present

## 2020-07-18 DIAGNOSIS — C44519 Basal cell carcinoma of skin of other part of trunk: Secondary | ICD-10-CM | POA: Diagnosis not present

## 2020-07-18 DIAGNOSIS — L720 Epidermal cyst: Secondary | ICD-10-CM | POA: Diagnosis not present

## 2020-07-24 DIAGNOSIS — Z85828 Personal history of other malignant neoplasm of skin: Secondary | ICD-10-CM | POA: Diagnosis not present

## 2020-07-24 DIAGNOSIS — Z85820 Personal history of malignant melanoma of skin: Secondary | ICD-10-CM | POA: Diagnosis not present

## 2020-07-24 DIAGNOSIS — L570 Actinic keratosis: Secondary | ICD-10-CM | POA: Diagnosis not present

## 2020-08-28 DIAGNOSIS — E039 Hypothyroidism, unspecified: Secondary | ICD-10-CM | POA: Diagnosis not present

## 2020-08-28 DIAGNOSIS — M859 Disorder of bone density and structure, unspecified: Secondary | ICD-10-CM | POA: Diagnosis not present

## 2020-08-30 DIAGNOSIS — I739 Peripheral vascular disease, unspecified: Secondary | ICD-10-CM | POA: Diagnosis not present

## 2020-08-30 DIAGNOSIS — G629 Polyneuropathy, unspecified: Secondary | ICD-10-CM | POA: Diagnosis not present

## 2020-08-30 DIAGNOSIS — F419 Anxiety disorder, unspecified: Secondary | ICD-10-CM | POA: Diagnosis not present

## 2020-08-30 DIAGNOSIS — Z1331 Encounter for screening for depression: Secondary | ICD-10-CM | POA: Diagnosis not present

## 2020-08-30 DIAGNOSIS — R0982 Postnasal drip: Secondary | ICD-10-CM | POA: Diagnosis not present

## 2020-08-30 DIAGNOSIS — Z1339 Encounter for screening examination for other mental health and behavioral disorders: Secondary | ICD-10-CM | POA: Diagnosis not present

## 2020-08-30 DIAGNOSIS — M5412 Radiculopathy, cervical region: Secondary | ICD-10-CM | POA: Diagnosis not present

## 2020-08-30 DIAGNOSIS — G2581 Restless legs syndrome: Secondary | ICD-10-CM | POA: Diagnosis not present

## 2020-08-30 DIAGNOSIS — M8580 Other specified disorders of bone density and structure, unspecified site: Secondary | ICD-10-CM | POA: Diagnosis not present

## 2020-08-30 DIAGNOSIS — E039 Hypothyroidism, unspecified: Secondary | ICD-10-CM | POA: Diagnosis not present

## 2020-08-30 DIAGNOSIS — Z0000 Encounter for general adult medical examination without abnormal findings: Secondary | ICD-10-CM | POA: Diagnosis not present

## 2020-08-30 DIAGNOSIS — Z853 Personal history of malignant neoplasm of breast: Secondary | ICD-10-CM | POA: Diagnosis not present

## 2020-08-30 DIAGNOSIS — R82998 Other abnormal findings in urine: Secondary | ICD-10-CM | POA: Diagnosis not present

## 2020-08-31 DIAGNOSIS — Z1212 Encounter for screening for malignant neoplasm of rectum: Secondary | ICD-10-CM | POA: Diagnosis not present

## 2020-09-05 ENCOUNTER — Other Ambulatory Visit (HOSPITAL_COMMUNITY): Payer: Self-pay | Admitting: Internal Medicine

## 2020-09-05 ENCOUNTER — Other Ambulatory Visit: Payer: Self-pay

## 2020-09-05 ENCOUNTER — Ambulatory Visit (HOSPITAL_COMMUNITY)
Admission: RE | Admit: 2020-09-05 | Discharge: 2020-09-05 | Disposition: A | Discharge status: Home / Self Care | Payer: PPO | Source: Ambulatory Visit | Attending: Internal Medicine | Admitting: Internal Medicine

## 2020-09-05 DIAGNOSIS — I739 Peripheral vascular disease, unspecified: Secondary | ICD-10-CM

## 2020-09-11 ENCOUNTER — Ambulatory Visit
Admission: RE | Admit: 2020-09-11 | Discharge: 2020-09-11 | Disposition: A | Discharge status: Home / Self Care | Payer: PPO | Source: Ambulatory Visit | Attending: Obstetrics & Gynecology | Admitting: Obstetrics & Gynecology

## 2020-09-11 ENCOUNTER — Other Ambulatory Visit: Payer: Self-pay

## 2020-09-11 DIAGNOSIS — Z1231 Encounter for screening mammogram for malignant neoplasm of breast: Secondary | ICD-10-CM | POA: Diagnosis not present

## 2020-10-23 ENCOUNTER — Encounter: Payer: Self-pay | Admitting: *Deleted

## 2020-10-24 ENCOUNTER — Ambulatory Visit: Payer: PPO | Admitting: Neurology

## 2020-10-24 ENCOUNTER — Encounter: Payer: Self-pay | Admitting: *Deleted

## 2020-10-24 ENCOUNTER — Encounter: Payer: Self-pay | Admitting: Neurology

## 2020-10-24 ENCOUNTER — Telehealth: Payer: Self-pay | Admitting: Neurology

## 2020-10-24 ENCOUNTER — Other Ambulatory Visit: Payer: Self-pay

## 2020-10-24 VITALS — BP 114/75 | HR 79 | Ht 67.0 in | Wt 136.0 lb

## 2020-10-24 DIAGNOSIS — M6281 Muscle weakness (generalized): Secondary | ICD-10-CM

## 2020-10-24 DIAGNOSIS — R202 Paresthesia of skin: Secondary | ICD-10-CM

## 2020-10-24 DIAGNOSIS — R269 Unspecified abnormalities of gait and mobility: Secondary | ICD-10-CM | POA: Diagnosis not present

## 2020-10-24 DIAGNOSIS — M48062 Spinal stenosis, lumbar region with neurogenic claudication: Secondary | ICD-10-CM

## 2020-10-24 DIAGNOSIS — I7300 Raynaud's syndrome without gangrene: Secondary | ICD-10-CM | POA: Diagnosis not present

## 2020-10-24 DIAGNOSIS — R29898 Other symptoms and signs involving the musculoskeletal system: Secondary | ICD-10-CM

## 2020-10-24 DIAGNOSIS — M6289 Other specified disorders of muscle: Secondary | ICD-10-CM

## 2020-10-24 DIAGNOSIS — R200 Anesthesia of skin: Secondary | ICD-10-CM | POA: Diagnosis not present

## 2020-10-24 DIAGNOSIS — G9519 Other vascular myelopathies: Secondary | ICD-10-CM | POA: Diagnosis not present

## 2020-10-24 DIAGNOSIS — M4807 Spinal stenosis, lumbosacral region: Secondary | ICD-10-CM | POA: Diagnosis not present

## 2020-10-24 NOTE — Patient Instructions (Addendum)
Blood work today MRI lumbar spine If workup negative will continue to emg/ncs and MRI brain and cervical spine 6-8 weeks   Raynaud Phenomenon  Raynaud phenomenon is a condition that affects the blood vessels (arteries) that carry blood to your fingers and toes. The arteries that supply blood to your ears, lips, nipples, or the tip of your nose might also be affected. Raynaud phenomenon causes the arteries to become narrow temporarily (spasm). As a result, the flow of blood to the affected areas is temporarily decreased. This usually occurs in response to cold temperatures or stress. During an attack, the skin in the affected areas turns white, then blue, and finally red. You may also feel tingling or numbness in those areas. Attacks usually last for only a brief period, and then the blood flow to the area returns to normal. In most cases, Raynaud phenomenon does not cause serious health problems. What are the causes? In many cases, the cause of this condition is not known. The condition may occur on its own (primary Raynaud phenomenon) or may be associated with other diseases or factors (secondary Raynaud phenomenon). Possible causes may include:  Diseases or medical conditions that damage the arteries.  Injuries and repetitive actions that hurt the hands or feet.  Being exposed to certain chemicals.  Taking medicines that narrow the arteries.  Other medical conditions, such as lupus, scleroderma, rheumatoid arthritis, thyroid problems, blood disorders, Sjogren syndrome, or atherosclerosis. What increases the risk? The following factors may make you more likely to develop this condition:  Being 93-42 years old.  Being female.  Having a family history of Raynaud phenomenon.  Living in a cold climate.  Smoking. What are the signs or symptoms? Symptoms of this condition usually occur when you are exposed to cold temperatures or when you have emotional stress. The symptoms may last for  a few minutes or up to several hours. They usually affect your fingers but may also affect your toes, nipples, lips, ears, or the tip of your nose. Symptoms may include:  Changes in skin color. The skin in the affected areas will turn pale or white. The skin may then change from white to bluish to red as normal blood flow returns to the area.  Numbness, tingling, or pain in the affected areas. In severe cases, symptoms may include:  Skin sores.  Tissues decaying and dying (gangrene). How is this diagnosed? This condition may be diagnosed based on:  Your symptoms and medical history.  A physical exam. During the exam, you may be asked to put your hands in cold water to check for a reaction to cold temperature.  Tests, such as: ? Blood tests to check for other diseases or conditions. ? A test to check the movement of blood through your arteries and veins (vascular ultrasound). ? A test in which the skin at the base of your fingernail is examined under a microscope (nailfold capillaroscopy). How is this treated? Treatment for this condition often involves making lifestyle changes and taking steps to control your exposure to cold temperatures. For more severe cases, medicine (calcium channel blockers) may be used to improve blood flow. Surgery is sometimes done to block the nerves that control the affected arteries, but this is rare. Follow these instructions at home: Avoiding cold temperatures Take these steps to avoid exposure to cold:  If possible, stay indoors during cold weather.  When you go outside during cold weather, dress in layers and wear mittens, a hat, a scarf, and warm  footwear.  Wear mittens or gloves when handling ice or frozen food.  Use holders for glasses or cans containing cold drinks.  Let warm water run for a while before taking a shower or bath.  Warm up the car before driving in cold weather. Lifestyle  If possible, avoid stressful and emotional  situations. Try to find ways to manage your stress, such as: ? Exercise. ? Yoga. ? Meditation. ? Biofeedback.  Do not use any products that contain nicotine or tobacco, such as cigarettes and e-cigarettes. If you need help quitting, ask your health care provider.  Avoid secondhand smoke.  Limit your use of caffeine. ? Switch to decaffeinated coffee, tea, and soda. ? Avoid chocolate.  Avoid vibrating tools and machinery. General instructions  Protect your hands and feet from injuries, cuts, or bruises.  Avoid wearing tight rings or wristbands.  Wear loose fitting socks and comfortable, roomy shoes.  Take over-the-counter and prescription medicines only as told by your health care provider. Contact a health care provider if:  Your discomfort becomes worse despite lifestyle changes.  You develop sores on your fingers or toes that do not heal.  Your fingers or toes turn black.  You have breaks in the skin on your fingers or toes.  You have a fever.  You have pain or swelling in your joints.  You have a rash.  Your symptoms occur on only one side of your body. Summary  Raynaud phenomenon is a condition that affects the arteries that carry blood to your fingers, toes, ears, lips, nipples, or the tip of your nose.  In many cases, the cause of this condition is not known.  Symptoms of this condition include changes in skin color, and numbness and tingling of the affected area.  Treatment for this condition includes lifestyle changes, reducing exposure to cold temperatures, and using medicines for severe cases of the condition.  Contact your health care provider if your condition worsens despite treatment. This information is not intended to replace advice given to you by your health care provider. Make sure you discuss any questions you have with your health care provider. Document Revised: 01/26/2020 Document Reviewed: 01/26/2020 Elsevier Patient Education  [place].

## 2020-10-24 NOTE — Telephone Encounter (Signed)
health team order sent to GI. No auth they will reach out to the patient to schedule.  

## 2020-10-24 NOTE — Progress Notes (Addendum)
[facility]    Provider:  Dr Jaynee Eagles Requesting Provider: Marton Redwood, MD Primary Care Provider:  Marton Redwood, MD  CC:  Weakness, numbness and tingling  HPI:  Kellie Humphrey is a 75 y.o. female here as requested by Marton Redwood, MD for peripheral neuropathy. PMHx heart valve disease, afib, tremor, RLS, tinnitus in the right ear, cervical radiculopathy, history of breast cancer 2009 status post lumpectomy and radiation, tamoxifen completed in 2019.Kellie Humphrey  Patient was seen in the past in 2015 for tremors and paresthesias and restless leg syndrome, she was also seen many years prior by Dr. Erling Cruz and Dr. Janann Colonel in neurology and notes are summarized here: She was given the diagnosis of "familial tremor" and started on gabapentin, gabapentin was helping with the restless leg syndrome, at that time patient described paresthesias over her whole body even her hair with some focal paresthesias in the left ulnar aspect of the left arm, feelings like they fell asleep in the toes and fingers, no side effects from the gabapentin which she was taking for years prior, she did have a hematoma 20 years or more on the ulnar aspect of the wrist however 7 years ago she had some new sensations in the left ulnar hand.  Father had tremors.  Neurologic exam was unremarkable except for decreased pinprick, temperature, and vibration bilaterally in the lower extremities.  Primidone was suggested, gray lease was tried, labs were evaluated including TSH, HIV, ESR, B12, IFE, heavy metals, RPR, Lyme, EMG nerve conduction study showed a normal study no electrophysiologic evidence for ulnar or median neuropathy, peripheral polyneuropathy or radicular neuropathy however a small fiber neuropathy could be responsible for symptoms and still of a detection by EMG nerve conduction study.  I also reviewed Dr. Marcine Matar notes, patient continues to be on gabapentin, she has been reporting worsening neuropathy and  was sent for treatment for that and follow-up of essential tremor and restless leg.  It appears her med list also includes primidone, and gabapentin.  She is here for follow up. She has some new symptoms. She has numbness in the fingers and the tingling and cold in the hands. The right leg has more pins and needles from the knee down and her feet feel very hot and tingling. Pins and needles, painful, worsening, in the right she feels it below the knees and in the front of the leg, lately it is both legs feel heavy and weak and she feels steady, she has low back pain, she has radiculopathy on the left side into the left leg, she walks for 30 years 3-4 miles 4-5x a week and she can do that but going up stairs is difficult her legs are very weak, 2-3 minutes for them to get better, standing helps doesn't have to sit down, burns in the bottom lower. Walking up hills is worse, they checked blood and seems ok with ultrasound. The hand symptoms started years ago, now off and on all the tips of fingers, not in the middle of the night, notices in the daytime and fingers turn white. Father had neuropathy unknown reason why was in a wheelchair no pain.  She also feels an ache in her shoulder and proximal arms, when she drives she has to put them down. No other focal neurologic deficits, associated symptoms, inciting events or modifiable factors.  Reviewed notes, labs and imaging from outside physicians, which showed: see above  Review of Systems: Patient complains of symptoms per HPI as well as  the following symptoms: weakness, numbness and tingling. Pertinent negatives and positives per HPI. All others negative.   Social History   Socioeconomic History  . Marital status: Married    Spouse name: Kellie Humphrey  . Number of children: 2  . Years of education: Bachelor  . Highest education level: Not on file  Occupational History  . Not on file  Tobacco Use  . Smoking status: Never Smoker  . Smokeless tobacco: Never  Used  Vaping Use  . Vaping Use: Never used  Substance and Sexual Activity  . Alcohol use: Yes    Comment: Socially  . Drug use: No  . Sexual activity: Not on file  Other Topics Concern  . Not on file  Social History Narrative   Patient is married to Kellie Humphrey, has 2 children   Patient is right handed   Education level is Bachelor   Caffeine consumption is none other than in chocolate occasionally       Social Determinants of Radio broadcast assistant Strain: Not on file  Food Insecurity: Not on file  Transportation Needs: Not on file  Physical Activity: Not on file  Stress: Not on file  Social Connections: Not on file  Intimate Partner Violence: Not on file    Family History  Problem Relation Age of Onset  . Coronary artery disease Mother        s/p MI (37s)  . Hypertension Mother   . Osteoporosis Mother        wrist fracture  . Alzheimer's disease Mother   . Osteoarthritis Father   . Neuropathy Father   . Tremor Father   . Other Father        lymphedema  . Fibromyalgia Sister   . Heart disease Maternal Grandmother   . Colon polyps Neg Hx   . Esophageal cancer Neg Hx   . Pancreatic cancer Neg Hx   . Stomach cancer Neg Hx   . Rectal cancer Neg Hx     Past Medical History:  Diagnosis Date  . Anxiety disorder   . Arthritis   . Breast cancer ([place])    s/p lumpectomy  . Cataract   . Dense breast 09/06/2013  . Dysrhythmia    PACs  . Essential tremor    /familial  . Heart murmur    corrected with valve surgery  . Hypothyroidism   . Interstitial cystitis   . Malignant neoplasm of lower-outer quadrant of right breast of female, estrogen receptor positive ([place]) 02/11/2012  . Mitral valve disorder 05/08/2009   Qualifier: Diagnosis of  By: [name], RN, [name]    . MVP (mitral valve prolapse)   . Neuropathy   . Nonrheumatic mitral valve regurgitation   . PAC (premature atrial contraction)   . Palpitations   . Paresthesias 07/29/2014  . Personal history of breast  cancer 09/06/2013  . PREMATURE ATRIAL CONTRACTIONS 05/08/2009   Qualifier: Diagnosis of  By: [name], RN, [name]    . Radiculopathy, cervical region   . RLS (restless legs syndrome)   . S/P minimally-invasive mitral valve repair 02/10/2020   Complex valvuloplasty including artificial Gore-tex neochord placement x8 with edge-to-edge suture plication of anterior commissure and 32 mm [name] 4D ring annuloplasty via right mini thoracotomy approach  . Thyroid disease   . Tremor 12/08/2013  . Varicose veins of right lower extremity with complications 8/58/8502    Patient Active Problem List   Diagnosis Date Noted  . Encounter for removal of sutures 02/23/2020  .  Encounter for therapeutic drug monitoring 02/20/2020  . Atrial fibrillation ([place]) 02/20/2020  . S/P minimally-invasive mitral valve repair 02/10/2020  . Nonrheumatic mitral valve regurgitation   . Varicose veins of right lower extremity with complications 87/56/4332  . Paresthesias 07/29/2014  . Tremor 12/08/2013  . Personal history of breast cancer 09/06/2013  . Dense breast 09/06/2013  . Malignant neoplasm of lower-outer quadrant of right breast of female, estrogen receptor positive ([place]) 02/11/2012  . Severe mitral valve regurgitation 05/08/2009  . PREMATURE ATRIAL CONTRACTIONS 05/08/2009    Past Surgical History:  Procedure Laterality Date  . bilateral cataract repair  july/aug 2021   Dr Tommy Rainwater  . BREAST BIOPSY  2009  . BREAST LUMPECTOMY    . CARDIAC CATHETERIZATION    . DILATION AND CURETTAGE OF UTERUS    . EYE SURGERY Left    Pterygium  . HEMANGIOMA EXCISION Left 1990   arm  . MELANOMA EXCISION Right 1980   thigh  . MITRAL VALVE REPAIR Right 02/10/2020   Procedure: MINIMALLY INVASIVE MITRAL VALVE REPAIR (MVR) using Memo 4D 32 MM Mitral Valve Ring.;  Surgeon: Rexene Alberts, MD;  Location: [facility];  Service: Open Heart Surgery;  Laterality: Right;  . NECK SURGERY  10/2009   C5-6 fusion  . RIGHT/LEFT HEART CATH AND  CORONARY ANGIOGRAPHY N/A 01/04/2020   Procedure: RIGHT/LEFT HEART CATH AND CORONARY ANGIOGRAPHY;  Surgeon: Burnell Blanks, MD;  Location: [facility];  Service: Cardiovascular;  Laterality: N/A;  . SHOULDER ARTHROSCOPY    . spider vein treatment    . TEE WITHOUT CARDIOVERSION N/A 06/30/2018   Procedure: TRANSESOPHAGEAL ECHOCARDIOGRAM (TEE);  Surgeon: Sueanne Margarita, MD;  Location: [facility] ENDOSCOPY;  Service: Cardiovascular;  Laterality: N/A;  . TEE WITHOUT CARDIOVERSION N/A 12/12/2019   Procedure: TRANSESOPHAGEAL ECHOCARDIOGRAM (TEE);  Surgeon: Dorothy Spark, MD;  Location: [facility] ENDOSCOPY;  Service: Cardiovascular;  Laterality: N/A;  . TEE WITHOUT CARDIOVERSION N/A 02/10/2020   Procedure: TRANSESOPHAGEAL ECHOCARDIOGRAM (TEE);  Surgeon: Rexene Alberts, MD;  Location: [facility];  Service: Open Heart Surgery;  Laterality: N/A;  . TONSILLECTOMY    . uterine fibroids removed      Current Outpatient Medications  Medication Sig Dispense Refill  . acetaminophen (TYLENOL) 500 MG tablet Take 500-1,000 mg by mouth See admin instructions. Take 500 mg at night, may take a second 500 -1000 mg dose as needed for pain    . aspirin EC 81 MG tablet Take 1 tablet (81 mg total) by mouth daily. Swallow whole. 90 tablet 3  . Biotin 5000 MCG CAPS Take 5,000 mcg by mouth every evening.    . Cholecalciferol (VITAMIN D) 2000 units tablet Take 4,000 Units by mouth daily.    . Cyanocobalamin (B-12) 5000 MCG CAPS Take 5,000 mcg by mouth 2 (two) times a week. Mondays & Fridays.    . diclofenac sodium (VOLTAREN) 1 % GEL APPLY 2-4 GRAMS TO LARGE JOINT AREA UP TO FOUR TIMES A DAY AS NEEDED 2 Tube 2  . flecainide (TAMBOCOR) 50 MG tablet Take 1 tablet (50 mg total) by mouth 2 (two) times daily. 180 tablet 3  . gabapentin (NEURONTIN) 600 MG tablet Take 600 mg by mouth 2 (two) times daily.    Kellie Humphrey ibuprofen (ADVIL) 200 MG tablet Take 400 mg by mouth as needed.    Kellie Humphrey levothyroxine (SYNTHROID) 50 MCG tablet Take 1 tablet (50  mcg total) by mouth daily before breakfast.    . melatonin 5 MG TABS Take 5 mg  by mouth.    . metoprolol tartrate (LOPRESSOR) 25 MG tablet Take 0.5 tablets (12.5 mg total) by mouth 2 (two) times daily. 90 tablet 3  . nitrofurantoin (MACRODANTIN) 50 MG capsule Take 1 capsule (50 mg total) by mouth daily. 30 capsule 6  . Omega-3 Fatty Acids (FISH OIL) 1000 MG CAPS Take by mouth in the morning and at bedtime.    . primidone (MYSOLINE) 50 MG tablet Take 50 mg by mouth at bedtime.    . SYSTANE ULTRA 0.4-0.3 % SOLN Place 1 drop into the left eye every 4 (four) hours as needed (dry/irritated eyes. (4-6 times daily)).     . TURMERIC PO Take by mouth.     No current facility-administered medications for this visit.    Allergies as of 10/24/2020 - Review Complete 10/24/2020  Allergen Reaction Noted  . Codeine Nausea And Vomiting 09/01/2012    Vitals: BP 114/75 (BP Location: Right Arm, Patient Position: Sitting)   Pulse 79   Ht '5\' 7"'  (1.702 m)   Wt 136 lb (61.7 kg)   BMI 21.30 kg/m  Last Weight:  Wt Readings from Last 1 Encounters:  10/24/20 136 lb (61.7 kg)   Last Height:   Ht Readings from Last 1 Encounters:  10/24/20 '5\' 7"'  (1.702 m)     Physical exam: Exam: Gen: NAD, conversant, well nourised, well groomed                     CV: RRR, no MRG. No Carotid Bruits. No peripheral edema, warm, nontender Eyes: Conjunctivae clear without exudates or hemorrhage  Neuro: Detailed Neurologic Exam  Speech:    Speech is normal; fluent and spontaneous with normal comprehension.  Cognition:    The patient is oriented to person, place, and time;     recent and remote memory intact;     language fluent;     normal attention, concentration,     fund of knowledge Cranial Nerves:    The pupils are equal, round, and reactive to light. Pupils too small to visualize fundi.. Visual fields are full to finger confrontation. Extraocular movements are intact. Trigeminal sensation is intact and the  muscles of mastication are normal. The face is symmetric. The palate elevates in the midline. Hearing intact. Voice is normal. Shoulder shrug is normal. The tongue has normal motion without fasciculations.   Coordination:    Normal finger to nose.   Gait:    Heel-toe and tandem gait are normal.   Motor Observation:    No asymmetry, no atrophy, and no involuntary movements noted. Tone:    Normal muscle tone.    Posture:    Posture is normal. normal erect    Strength: mild right prox LE hip flexion weakness 5- but overall strength is V/V in the upper and lower limbs.      Sensation: intact to LT, pin prick, vibration, slight decrease temp distally in the top of the foot.     Reflex Exam:  DTR's:    1+ Ajs. Deep tendon reflexes in the upper and lower extremities are normal bilaterally.   Toes:    The toes are downgoing bilaterally.   Clonus:    Clonus is absent.    Assessment/Plan:   75 y.o. female here as requested by Marton Redwood, MD for peripheral neuropathy. PMHx heart valve disease, afib, tremor, RLS, tinnitus in the right ear, cervical radiculopathy, history of breast cancer 2009 status post lumpectomy and radiation, tamoxifen completed in 2019.Kellie Humphrey  Addendum: MRI lumbar spine without stenosis, will order MRI cervical spine and brain.  Extensive Blood work today for many causes of weakness and neuropathy MRI lumbar spine as below If workup above negative will continue to emg/ncs and consider MRI brain and cervical spine 6-8 weeks  MRI lumbar spine for evaluation of possible spinal stenosis due to claudication: going up stairs is difficult her legs are very weak, 2-3 minutes for them to get better, standing helps doesn't have to sit down, burns in the bottom lowers, walking up a hills is worse. Blood flow has been tested.   Finger numbness turning white: raynaud's? Will test other autoimmune disorders. Does not seem to be VTS or cervical radic, happens in the cold, runs  fingers under hpot water helps.   Neuropathy in the feet: Not progressed in fact better than I documented years ago,  intact to LT, pin prick, vibration, slight decrese temp distally in the top of the foot.  EMG/NCS: right arm and right leg schedule 6-8 weeks out and may cancel if another etiology found   Orders Placed This Encounter  Procedures  . MR LUMBAR SPINE WO CONTRAST  . Acetylcholine receptor, binding  . Acetylcholine receptor, blocking  . Acetylcholine receptor, modulating  . CK  . Rheumatoid factor  . Sjogren's syndrome antibods(ssa + ssb)  . Magnesium  . CBC with Differential/Platelets  . Comprehensive metabolic panel  . ANA  . ANA, IFA (with reflex)  . Hemoglobin A1c  . Vitamin B1  . B12 and Folate Panel  . Vitamin B6  . TSH  . Sedimentation rate  . Heavy metals, blood  . Multiple Myeloma Panel (SPEP&IFE w/QIG)  . NCV with EMG(electromyography)   No orders of the defined types were placed in this encounter.   Cc: Marton Redwood, MD,  Marton Redwood, MD  Sarina Ill, MD  [facility] [address] [place], [place] [address]  Phone [phone] Fax [phone]

## 2020-11-05 DIAGNOSIS — Z124 Encounter for screening for malignant neoplasm of cervix: Secondary | ICD-10-CM | POA: Diagnosis not present

## 2020-11-05 DIAGNOSIS — N83209 Unspecified ovarian cyst, unspecified side: Secondary | ICD-10-CM | POA: Diagnosis not present

## 2020-11-05 DIAGNOSIS — Z6821 Body mass index (BMI) 21.0-21.9, adult: Secondary | ICD-10-CM | POA: Diagnosis not present

## 2020-11-06 ENCOUNTER — Ambulatory Visit: Payer: PPO | Admitting: Cardiology

## 2020-11-06 ENCOUNTER — Other Ambulatory Visit: Payer: Self-pay

## 2020-11-06 ENCOUNTER — Encounter: Payer: Self-pay | Admitting: Cardiology

## 2020-11-06 VITALS — BP 102/68 | HR 73 | Ht 67.0 in | Wt 134.8 lb

## 2020-11-06 DIAGNOSIS — Z9889 Other specified postprocedural states: Secondary | ICD-10-CM

## 2020-11-06 DIAGNOSIS — M7989 Other specified soft tissue disorders: Secondary | ICD-10-CM | POA: Diagnosis not present

## 2020-11-06 NOTE — Patient Instructions (Signed)
Medication Instructions:   Your physician recommends that you continue on your current medications as directed. Please refer to the Current Medication list given to you today.  *If you need a refill on your cardiac medications before your next appointment, please call your pharmacy*   Testing/Procedures:  Your physician has requested that you have a lower extremity venous duplex. This test is an ultrasound of the veins in the legs or arms. It looks at venous blood flow that carries blood from the heart to the legs or arms. Allow one hour for a Lower Venous exam. Allow thirty minutes for an Upper Venous exam. There are no restrictions or special instructions.   Follow-Up: At [facility], you and your health needs are our priority.  As part of our continuing mission to provide you with exceptional heart care, we have created designated Provider Care Teams.  These Care Teams include your primary Cardiologist (physician) and Advanced Practice Providers (APPs -  Physician Assistants and Nurse Practitioners) who all work together to provide you with the care you need, when you need it.  We recommend signing up for the patient portal called "MyChart".  Sign up information is provided on this After Visit Summary.  MyChart is used to connect with patients for Virtual Visits (Telemedicine).  Patients are able to view lab/test results, encounter notes, upcoming appointments, etc.  Non-urgent messages can be sent to your provider as well.   To learn more about what you can do with MyChart, go to [email or web address].    Your next appointment:   6 month(s)  The format for your next appointment:   In Person  Provider:   Gwyndolyn Kaufman, MD

## 2020-11-06 NOTE — Progress Notes (Signed)
Cardiology Office Note  Date:  11/06/2020   ID:  Kellie Humphrey, DOB [birth year], MRN [id number]  PCP:  Marton Redwood, MD  Cardiologist:  Dr. [name]    Reason for visit: Follow-up in right lower extremity edema 19   History of Present Illness: Kellie Humphrey is a 75 y.o. female with h/o mitral valve prolapse with moderate to severe MR, PACs, breast cancer s/p lumpectomy and radiation in 2009, interstitial cystitis, and chronic cystitis on macrobid. She wore a monitor which revealed sinus brady to sinus tach, with freq PVCs. + PACs and SVT with 15 beats. Flecainide was started at 50 mg BID with resolution of her symptoms. TEE in October 2019 showed LVEF 60% to 65%. Moderate, holosystolic prolapse, involving the anterior leaflet and the posterior leaflet with moderate to severe regurgitation.  Stress echocardiogram on Feb 09, 2019 showed good exercise capacity, however her heart function is now 50 to 55%, her mitral regurgitation was underestimated but is at least moderate, her right-sided pressure at peak exercise were 59 mmHg. Repeat echocardiogram in September 2020 showed severe MR with jet hitting posterior wall of the left atrium, her LVEF was 60 to 65%.  The patient underwent minimally invasive mitral valve repair using Memo 4D 32 MM Mitral Valve Ring by Dr. Roxy Manns on Feb 10, 2020.  She underwent cardiac catheterization prior to surgery with no evidence for coronary artery disease.  Stop course was complicated by atrial fibrillation with quickly cardioverted after initiation of IV amiodarone but she continued to have paroxysmal A. fib over the next few days, she was sent home on amiodarone 200 mg p.o. twice daily that she is still taking.  Flecainide that was previously started for frequent PVCs and PAC was discontinued.  She is coming today, she states that she can now walk up to 15 minutes with some shortness of breath.  She has no lower extremity edema.  No orthopnea or proximal  nocturnal dyspnea.  She continues to have palpitations with irregular beats, she brings diary of it and it seems like she has an episode almost every day.  She has significant bruising around the incision area, and in her right breast, however no discharge, no erythema, no fever or chills.  She has been compliant with her meds.  Denies any falls.  The patient is feeling and looking great, she can now walk 3 to 4 miles a day without any chest pain or shortness of breath, she feels back to her baseline except for occasional incision related pains and some numbness in the area around the incision on the right chest wall. She denies any further palpitations. No orthopnea proximal nocturnal dyspnea. No dizziness. She has been experiencing right lower extremity swelling and pain predominantly at rest. She underwent bilateral arterial ultrasound that showed good peripheral circulation.  Past Medical History:  Diagnosis Date  . Anxiety disorder   . Arthritis   . Breast cancer ([place])    s/p lumpectomy  . Cataract   . Dense breast 09/06/2013  . Dysrhythmia    PACs  . Essential tremor    /familial  . Heart murmur    corrected with valve surgery  . Hypothyroidism   . Interstitial cystitis   . Malignant neoplasm of lower-outer quadrant of right breast of female, estrogen receptor positive ([place]) 02/11/2012  . Mitral valve disorder 05/08/2009   Qualifier: Diagnosis of  By: [name], RN, [name]    . MVP (mitral valve prolapse)   . Neuropathy   .  Nonrheumatic mitral valve regurgitation   . PAC (premature atrial contraction)   . Palpitations   . Paresthesias 07/29/2014  . Personal history of breast cancer 09/06/2013  . PREMATURE ATRIAL CONTRACTIONS 05/08/2009   Qualifier: Diagnosis of  By: [name], RN, [name]    . Radiculopathy, cervical region   . RLS (restless legs syndrome)   . S/P minimally-invasive mitral valve repair 02/10/2020   Complex valvuloplasty including artificial Gore-tex neochord placement x8  with edge-to-edge suture plication of anterior commissure and 32 mm [name] 4D ring annuloplasty via right mini thoracotomy approach  . Thyroid disease   . Tremor 12/08/2013  . Varicose veins of right lower extremity with complications 2/95/6213   Past Surgical History:  Procedure Laterality Date  . bilateral cataract repair  july/aug 2021   Dr Tommy Rainwater  . BREAST BIOPSY  2009  . BREAST LUMPECTOMY    . CARDIAC CATHETERIZATION    . DILATION AND CURETTAGE OF UTERUS    . EYE SURGERY Left    Pterygium  . HEMANGIOMA EXCISION Left 1990   arm  . MELANOMA EXCISION Right 1980   thigh  . MITRAL VALVE REPAIR Right 02/10/2020   Procedure: MINIMALLY INVASIVE MITRAL VALVE REPAIR (MVR) using Memo 4D 32 MM Mitral Valve Ring.;  Surgeon: Rexene Alberts, MD;  Location: [facility];  Service: Open Heart Surgery;  Laterality: Right;  . NECK SURGERY  10/2009   C5-6 fusion  . RIGHT/LEFT HEART CATH AND CORONARY ANGIOGRAPHY N/A 01/04/2020   Procedure: RIGHT/LEFT HEART CATH AND CORONARY ANGIOGRAPHY;  Surgeon: Burnell Blanks, MD;  Location: [facility];  Service: Cardiovascular;  Laterality: N/A;  . SHOULDER ARTHROSCOPY    . spider vein treatment    . TEE WITHOUT CARDIOVERSION N/A 06/30/2018   Procedure: TRANSESOPHAGEAL ECHOCARDIOGRAM (TEE);  Surgeon: Sueanne Margarita, MD;  Location: [facility] ENDOSCOPY;  Service: Cardiovascular;  Laterality: N/A;  . TEE WITHOUT CARDIOVERSION N/A 12/12/2019   Procedure: TRANSESOPHAGEAL ECHOCARDIOGRAM (TEE);  Surgeon: Dorothy Spark, MD;  Location: [facility] ENDOSCOPY;  Service: Cardiovascular;  Laterality: N/A;  . TEE WITHOUT CARDIOVERSION N/A 02/10/2020   Procedure: TRANSESOPHAGEAL ECHOCARDIOGRAM (TEE);  Surgeon: Rexene Alberts, MD;  Location: [facility];  Service: Open Heart Surgery;  Laterality: N/A;  . TONSILLECTOMY    . uterine fibroids removed       Current Outpatient Medications  Medication Sig Dispense Refill  . acetaminophen (TYLENOL) 500 MG tablet Take 500-1,000 mg by  mouth See admin instructions. Take 500 mg at night, may take a second 500 -1000 mg dose as needed for pain    . aspirin EC 81 MG tablet Take 1 tablet (81 mg total) by mouth daily. Swallow whole. 90 tablet 3  . Biotin 5000 MCG CAPS Take 5,000 mcg by mouth every evening.    . Cholecalciferol (VITAMIN D) 2000 units tablet Take 4,000 Units by mouth daily.    . Cyanocobalamin (B-12) 5000 MCG CAPS Take 5,000 mcg by mouth 2 (two) times a week. Mondays & Fridays.    . diclofenac sodium (VOLTAREN) 1 % GEL APPLY 2-4 GRAMS TO LARGE JOINT AREA UP TO FOUR TIMES A DAY AS NEEDED 2 Tube 2  . flecainide (TAMBOCOR) 50 MG tablet Take 1 tablet (50 mg total) by mouth 2 (two) times daily. 180 tablet 3  . gabapentin (NEURONTIN) 600 MG tablet Take 600 mg by mouth 2 (two) times daily.    [name] ibuprofen (ADVIL) 200 MG tablet Take 400 mg by mouth as needed.    [name]  levothyroxine (SYNTHROID) 50 MCG tablet Take 1 tablet (50 mcg total) by mouth daily before breakfast.    . melatonin 5 MG TABS Take 5 mg by mouth.    . metoprolol tartrate (LOPRESSOR) 25 MG tablet Take 0.5 tablets (12.5 mg total) by mouth 2 (two) times daily. 90 tablet 3  . nitrofurantoin (MACRODANTIN) 50 MG capsule Take 1 capsule (50 mg total) by mouth daily. 30 capsule 6  . Omega-3 Fatty Acids (FISH OIL) 1000 MG CAPS Take by mouth in the morning and at bedtime.    . primidone (MYSOLINE) 50 MG tablet Take 50 mg by mouth at bedtime.    . SYSTANE ULTRA 0.4-0.3 % SOLN Place 1 drop into the left eye every 4 (four) hours as needed (dry/irritated eyes. (4-6 times daily)).     . TURMERIC PO Take by mouth.     No current facility-administered medications for this visit.   Allergies:   Codeine   Social History:  The patient  reports that she has never smoked. She has never used smokeless tobacco. She reports current alcohol use. She reports that she does not use drugs.  Family History:  The patient's family history includes Alzheimer's disease in her mother; Coronary  artery disease in her mother; Fibromyalgia in her sister; Heart disease in her maternal grandmother; Hypertension in her mother; Neuropathy in her father; Osteoarthritis in her father; Osteoporosis in her mother; Other in her father; Tremor in her father.   ROS:  General:no colds or fevers, no weight changes Skin:no rashes or ulcers HEENT:no blurred vision, no congestion CV:see HPI PUL:see HPI GI:no diarrhea constipation or melena, no indigestion GU:no hematuria, no dysuria MS:no joint pain, no claudication Neuro:no syncope, no lightheadedness Endo:no diabetes, + thyroid disease  Wt Readings from Last 3 Encounters:  11/06/20 134 lb 12.8 oz (61.1 kg)  10/24/20 136 lb (61.7 kg)  07/04/20 138 lb (62.6 kg)    PHYSICAL EXAM: VS:  BP 102/68   Pulse 73   Ht 5\' 7"  (1.702 m)   Wt 134 lb 12.8 oz (61.1 kg)   BMI 21.11 kg/m  , BMI Body mass index is 21.11 kg/m. General:Pleasant affect, NAD Skin:Warm and dry, brisk capillary refill HEENT:normocephalic, sclera clear, mucus membranes moist Neck:supple, no JVD, no bruits  Heart:S1,S2 RRR no murmurs, gallup, rub or click Incision in the right chest wall seems healing well, there is no discharge no erythema, there is significant bruising in her right breast and in the area about the breast. Lungs:clear without rales, rhonchi, or wheezes RAQ:TMAU, non tender, + BS, do not palpate liver spleen or masses Ext:no lower ext edema, 2+ pedal pulses, 2+ radial pulses Neuro:alert and oriented X 3, MAE, follows commands, + facial symmetry  EKG:  EKG is ordered today. The ekg ordered today demonstrates normal sinus rhythm normal EKG unchanged from prior.  Personally reviewed.  Recent Labs: 10/24/2020: ALT 14; BUN 10; Creatinine, Ser 0.77; Hemoglobin 13.8; Magnesium 2.1; Platelets 249; Potassium 4.1; Sodium 141; TSH 1.950   Lipid Panel No results found for: CHOL, TRIG, HDL, CHOLHDL, VLDL, LDLCALC, LDLDIRECT   TTE 06/17/18 Myxomatous, moderately  thickened valve with bileaflet prolapse. Mitral regurgitation jet is central and at least moderate to severe with reversal of forward flow in the pulmonary veins. However, left atrial size is normal and so are right sided pressures. A TEE is recommended for further evaluation.  Echo TEE 06/30/18 - Left ventricle: Systolic function was normal. The estimated ejection fraction was in the range of 60%  to 65%. Wall motion was normal; there were no regional wall motion abnormalities. - Aortic valve: Trileaflet; mildly thickened leaflets. There was trivial regurgitation. - Mitral valve: Moderate, holosystolicprolapse, involving the anterior leaflet and the posterior leaflet. There was moderate to severe regurgitation. Effective regurgitant orifice (PISA): 0.22 cm^2. Regurgitant volume (PISA): 42 ml. - Left atrium: No evidence of thrombus in the atrial cavity or appendage. - Right atrium: The atrium was mildly dilated. No evidence of thrombus in the atrial cavity or appendage. - Tricuspid valve: There was mild regurgitation. - Pulmonic valve: There was trivial regurgitation.  TTE: 05/2019  1. Left ventricular ejection fraction, by visual estimation, is 65 to  70%. The left ventricle has normal function. Normal left ventricular size.  There is no left ventricular hypertrophy.  2. Left ventricular diastolic Doppler parameters are consistent with  impaired relaxation pattern of LV diastolic filling.  3. Global right ventricle has normal systolic function.The right  ventricular size is normal. No increase in right ventricular wall  thickness.  4. Left atrial size was normal.  5. Right atrial size was normal.  6. Moderate to severe mitral valve regurgitation.  7. MV leaflets have myxomatous appearance There is mild prolapse of both  leaflets There is moderate to severe MR.  8. The tricuspid valve is normal in structure. Tricuspid valve  regurgitation is mild.   9. The aortic valve is tricuspid Aortic valve regurgitation was not  visualized by color flow Doppler. Mild aortic valve sclerosis without  stenosis.  10. The pulmonic valve was normal in structure. Pulmonic valve  regurgitation is mild by color flow Doppler.   ETT on flecanide    Study Highlights     Blood pressure demonstrated a normal response to exercise.  No T wave inversion was noted during stress.  Upsloping ST segment depression ST segment depression of 1 mm was noted during stress in the II, III, aVF, V6, V5 and V4 leads, and returning to baseline after less than 1 minute of recovery.  The patient experienced no angina during the stress test  Overall, the patient's exercise capacity was excellent.  [facility] Treadmill Score: low risk  Negative stress test without evidence of ischemia at given workload. Excellent exercise capacity. Frequent PVC's noted in early recovery.    48 hour Holter 06/09/18   Sinus bradycardia to sinus tachycardia.  Infrequent PACs and frequent PVCs (1600 in 48 hrs = 1 % of all beats).  SVT runs the longest consisting of 15 beats.   Sinus bradycardia to sinus tachycardia. Infrequent PACs and frequent PVCs (1600 in 48 hrs = 1 % of all beats). SVT runs the longest consisting of 15 beats. Symptoms correlating with PVCs.   ASSESSMENT AND PLAN:  Severe MR TTE in September 2020, status post Memo 4D 32 MM Mitral Valve Ring by Dr. Roxy Manns on Feb 10, 2020. She had repeat echocardiogram on March 26, 2020 that shows LVEF 55 to 60%, trivial mitral regurgitation and mean transmitral gradient of 7 mmHg.  Normal RVSP.   She is functional class I, exercises daily and is asymptomatic.  Right lower extremity edema and pain -we will obtain right lower venous duplex to rule out DVT.  Paroxysmal atrial fibrillation -this has resolved.  She remains in sinus rhythm, she is off Coumadin, we will continue low-dose flecainide and metoprolol.  SVT, PACs and PVCs  - well-controlled on flecainide.  Current medicines are reviewed with the patient today.  The patient Has no concerns regarding medicines.  The following  changes have been made:  See above Labs/ tests ordered today include:see above  Disposition:   FU: In 6 months.  Signed, Ena Dawley, MD  11/06/2020 2:42 PM    [facility] [place], [address] [place], [place] Phone: [phone]; Fax: [phone]

## 2020-11-07 ENCOUNTER — Ambulatory Visit
Admission: RE | Admit: 2020-11-07 | Discharge: 2020-11-07 | Disposition: A | Discharge status: Home / Self Care | Payer: PPO | Source: Ambulatory Visit | Attending: Neurology | Admitting: Neurology

## 2020-11-07 DIAGNOSIS — M4807 Spinal stenosis, lumbosacral region: Secondary | ICD-10-CM

## 2020-11-08 ENCOUNTER — Other Ambulatory Visit: Payer: Self-pay

## 2020-11-08 ENCOUNTER — Ambulatory Visit (HOSPITAL_COMMUNITY)
Admission: RE | Admit: 2020-11-08 | Discharge: 2020-11-08 | Disposition: A | Discharge status: Home / Self Care | Payer: PPO | Source: Ambulatory Visit | Attending: Cardiovascular Disease | Admitting: Cardiovascular Disease

## 2020-11-08 DIAGNOSIS — M7989 Other specified soft tissue disorders: Secondary | ICD-10-CM | POA: Diagnosis not present

## 2020-11-09 ENCOUNTER — Other Ambulatory Visit: Payer: Self-pay | Admitting: Neurology

## 2020-11-09 DIAGNOSIS — M5417 Radiculopathy, lumbosacral region: Secondary | ICD-10-CM

## 2020-11-09 NOTE — Addendum Note (Signed)
Addended by: Sarina Ill B on: 11/09/2020 05:25 PM   Modules accepted: Orders

## 2020-11-12 ENCOUNTER — Telehealth: Payer: Self-pay | Admitting: Neurology

## 2020-11-12 NOTE — Telephone Encounter (Signed)
Health team order sent to GI. No auth they will reach out to the patient to schedule.  

## 2020-11-17 ENCOUNTER — Other Ambulatory Visit: Payer: PPO

## 2020-11-17 LAB — MULTIPLE MYELOMA PANEL, SERUM
Albumin SerPl Elph-Mcnc: 4 g/dL (ref 2.9–4.4)
Albumin/Glob SerPl: 1.3 (ref 0.7–1.7)
Alpha 1: 0.3 g/dL (ref 0.0–0.4)
Alpha2 Glob SerPl Elph-Mcnc: 0.7 g/dL (ref 0.4–1.0)
B-Globulin SerPl Elph-Mcnc: 1.1 g/dL (ref 0.7–1.3)
Gamma Glob SerPl Elph-Mcnc: 1.2 g/dL (ref 0.4–1.8)
Globulin, Total: 3.3 g/dL (ref 2.2–3.9)
IgA/Immunoglobulin A, Serum: 364 mg/dL (ref 64–422)
IgG (Immunoglobin G), Serum: 1138 mg/dL (ref 586–1602)
IgM (Immunoglobulin M), Srm: 80 mg/dL (ref 26–217)

## 2020-11-17 LAB — VITAMIN B1: Thiamine: 117.4 nmol/L (ref 66.5–200.0)

## 2020-11-17 LAB — COMPREHENSIVE METABOLIC PANEL
ALT: 14 IU/L (ref 0–32)
AST: 22 IU/L (ref 0–40)
Albumin/Globulin Ratio: 1.6 (ref 1.2–2.2)
Albumin: 4.5 g/dL (ref 3.7–4.7)
Alkaline Phosphatase: 115 IU/L (ref 44–121)
BUN/Creatinine Ratio: 13 (ref 12–28)
BUN: 10 mg/dL (ref 8–27)
Bilirubin Total: 0.4 mg/dL (ref 0.0–1.2)
CO2: 23 mmol/L (ref 20–29)
Calcium: 9.8 mg/dL (ref 8.7–10.3)
Chloride: 102 mmol/L (ref 96–106)
Creatinine, Ser: 0.77 mg/dL (ref 0.57–1.00)
GFR calc Af Amer: 89 mL/min/{1.73_m2} (ref >59)
GFR calc non Af Amer: 77 mL/min/{1.73_m2} (ref >59)
Globulin, Total: 2.8 g/dL (ref 1.5–4.5)
Glucose: 84 mg/dL (ref 65–99)
Potassium: 4.1 mmol/L (ref 3.5–5.2)
Sodium: 141 mmol/L (ref 134–144)
Total Protein: 7.3 g/dL (ref 6.0–8.5)

## 2020-11-17 LAB — VITAMIN B6: Vitamin B6: 17.6 ug/L (ref 2.0–32.8)

## 2020-11-17 LAB — CBC WITH DIFFERENTIAL/PLATELET
Basophils Absolute: 0 10*3/uL (ref 0.0–0.2)
Basos: 0 %
EOS (ABSOLUTE): 0.2 10*3/uL (ref 0.0–0.4)
Eos: 5 %
Hematocrit: 40.8 % (ref 34.0–46.6)
Hemoglobin: 13.8 g/dL (ref 11.1–15.9)
Immature Grans (Abs): 0 10*3/uL (ref 0.0–0.1)
Immature Granulocytes: 0 %
Lymphocytes Absolute: 1.1 10*3/uL (ref 0.7–3.1)
Lymphs: 21 %
MCH: 30.6 pg (ref 26.6–33.0)
MCHC: 33.8 g/dL (ref 31.5–35.7)
MCV: 91 fL (ref 79–97)
Monocytes Absolute: 0.5 10*3/uL (ref 0.1–0.9)
Monocytes: 10 %
Neutrophils Absolute: 3.2 10*3/uL (ref 1.4–7.0)
Neutrophils: 64 %
Platelets: 249 10*3/uL (ref 150–450)
RBC: 4.51 x10E6/uL (ref 3.77–5.28)
RDW: 13.5 % (ref 11.7–15.4)
WBC: 5.1 10*3/uL (ref 3.4–10.8)

## 2020-11-17 LAB — TSH: TSH: 1.95 u[IU]/mL (ref 0.450–4.500)

## 2020-11-17 LAB — RHEUMATOID FACTOR: Rheumatoid fact SerPl-aCnc: <10 IU/mL (ref <14.0)

## 2020-11-17 LAB — HEMOGLOBIN A1C
Est. average glucose Bld gHb Est-mCnc: 114 mg/dL
Hgb A1c MFr Bld: 5.6 % (ref 4.8–5.6)

## 2020-11-17 LAB — ACHR RECEPTOR MODULATING AB: Acetylcholine Modulating Ab: 0 % (ref <=45)

## 2020-11-17 LAB — B12 AND FOLATE PANEL
Folate: 3.4 ng/mL (ref >3.0)
Vitamin B-12: 1675 pg/mL — ABNORMAL HIGH (ref 232–1245)

## 2020-11-17 LAB — SJOGREN'S SYNDROME ANTIBODS(SSA + SSB)
ENA SSA (RO) Ab: <0.2 AI (ref 0.0–0.9)
ENA SSB (LA) Ab: <0.2 AI (ref 0.0–0.9)

## 2020-11-17 LAB — HEAVY METALS, BLOOD
Arsenic: <1 ug/L — ABNORMAL LOW (ref 2–23)
Lead, Blood: 2 ug/dL (ref 0–4)
Mercury: <1 ug/L (ref 0.0–14.9)

## 2020-11-17 LAB — ACETYLCHOLINE RECEPTOR, BINDING: AChR Binding Ab, Serum: <0.03 nmol/L (ref 0.00–0.24)

## 2020-11-17 LAB — ANA: ANA Titer 1: NEGATIVE

## 2020-11-17 LAB — ACETYLCHOLINE RECEPTOR, BLOCKING: Acetylchol Block Ab: 21 % (ref 0–25)

## 2020-11-17 LAB — CK: Total CK: 48 U/L (ref 32–182)

## 2020-11-17 LAB — SEDIMENTATION RATE: Sed Rate: 7 mm/hr (ref 0–40)

## 2020-11-17 LAB — MAGNESIUM: Magnesium: 2.1 mg/dL (ref 1.6–2.3)

## 2020-11-27 ENCOUNTER — Ambulatory Visit: Payer: PPO | Admitting: Physical Medicine and Rehabilitation

## 2020-11-27 ENCOUNTER — Encounter: Payer: Self-pay | Admitting: Physical Medicine and Rehabilitation

## 2020-11-27 ENCOUNTER — Other Ambulatory Visit: Payer: Self-pay

## 2020-11-27 VITALS — BP 105/72 | HR 70

## 2020-11-27 DIAGNOSIS — R202 Paresthesia of skin: Secondary | ICD-10-CM

## 2020-11-27 DIAGNOSIS — M47816 Spondylosis without myelopathy or radiculopathy, lumbar region: Secondary | ICD-10-CM

## 2020-11-27 DIAGNOSIS — M5442 Lumbago with sciatica, left side: Secondary | ICD-10-CM

## 2020-11-27 DIAGNOSIS — M48061 Spinal stenosis, lumbar region without neurogenic claudication: Secondary | ICD-10-CM | POA: Diagnosis not present

## 2020-11-27 DIAGNOSIS — G8929 Other chronic pain: Secondary | ICD-10-CM | POA: Diagnosis not present

## 2020-11-27 NOTE — Progress Notes (Signed)
Low back pain more on left. Legs feel weak and unsteady. No leg pain. Left buttock pain.  Numeric Pain Rating Scale and Functional Assessment Average Pain 7 Pain Right Now 3 My pain is constant, dull and aching Pain is worse with: standing Pain improves with: rest and heat/ice   In the last MONTH (on 0-10 scale) has pain interfered with the following?  1. General activity like being  able to carry out your everyday physical activities such as walking, climbing stairs, carrying groceries, or moving a chair?  Rating(2)  2. Relation with others like being able to carry out your usual social activities and roles such as  activities at home, at work and in your community. Rating(2)  3. Enjoyment of life such that you have  been bothered by emotional problems such as feeling anxious, depressed or irritable?  Rating(5)

## 2020-11-28 DIAGNOSIS — N83201 Unspecified ovarian cyst, right side: Secondary | ICD-10-CM | POA: Diagnosis not present

## 2020-11-28 NOTE — Progress Notes (Signed)
Lorelle Macaluso Dibble - 75 y.o. female MRN [id number]  Date of birth: May 19, 1946  Office Visit Note: Visit Date: 11/27/2020 PCP: Marton Redwood, MD Referred by: Marton Redwood, MD  Subjective: Chief Complaint  Patient presents with  . Lower Back - Pain   HPI: Leana Springston is a 75 y.o. female who comes in today At the request of Dr. Sarina Ill for evaluation and management of chronic worsening severe low back pain left more than right with some referral into the left hip and leg with paresthesias in the feet.  Her notes today fully reviewed.  The patient's history with me is that I saw her in 2010 and completed a cervical epidural injection at that time but really had no further management.  She does endorse a history of chronic neck and back pain.  Over the last several years and particularly last couple of months or so she has had worsening low back pain worse with standing and she can stand for very long sometimes report really starts to bother her and refers down into the left hip and buttock area more of an L5 distribution.  She reports paresthesias more in the lower limbs from the knee down.  She reports her father had a history of neuropathy.  She does have electrodiagnostic studies on order for the next few weeks coming up.  She reports some unsteadiness.  The weakness that she describes is more unsteadiness of the legs and with prolonged walking they feel like she just needs to sit down.  She is an avid walker and has been walking for many years for exercise and she continues to do that.  She does yoga exercises and stretching and that does seem to help her pain somewhat in her lower back.  She has had no prior lumbar spine surgery.  She has had no prior lumbar injections.  She denies focal weakness or foot drop.  She denies any specific night pain.  She takes Tylenol sometimes twice a day, 1000 mg at a time and this takes the edge off a little bit.  She is taking gabapentin.  She will use  ibuprofen on rare occasions and that seems to help some.  She reports her average pain is a 7 out of 10 sitting in the chair today its only a 3.  Is a constant dull and aching pain but can be more sharp into that left hip.  Worse with standing better with rest ice and heat.  Dr. Jaynee Eagles did obtain MRI of the lumbar spine and this is reviewed with the patient today with spine models and imaging.  The report is below.  She actually has a fairly good spine for her age without any central canal stenosis.  She does have some facet arthritis of the lower lumbar region.  She does have by foraminal narrowing at L5-S1.  Review of Systems  Musculoskeletal: Positive for back pain and joint pain.  Neurological: Positive for tingling.  All other systems reviewed and are negative.  Otherwise per HPI.  Assessment & Plan: Visit Diagnoses:    ICD-10-CM   1. Chronic bilateral low back pain with left-sided sciatica  M54.42    G89.29   2. Spondylosis without myelopathy or radiculopathy, lumbar region  M47.816   3. Foraminal stenosis of lumbar region  M48.061   4. Paresthesia of skin  R20.2      Plan: Findings:  Chronic history of off-and-on back pain but now with worsening over the last several  months with severe pain with prolonged standing with some referral in the left hip and thigh.  She has paresthesias and tingling in the lower extremities but she is not sure the 2 are related.  She does not relate that she has increased symptoms in the lower legs with increased back pain when it happens.  The pain is mainly in the afternoons with prolonged standing and doing things.  She still an avid walker and does home exercises and has had physical therapy in the past.  She is set to undergo electrodiagnostic studies in the next few weeks.  I think that would tell us something about whether this is a neuropathy or confirmed radiculopathy.  She has signs and symptoms consistent with facet mediated low back pain combined with  myofascial pain syndrome with trigger points.  She could have nerve root irritation of L5 particularly on the left.  No high-grade nerve compression.  Lastly on the differential would be sacroiliac joint pain on the left.  She has had MRI findings of by foraminal narrowing at L5-S1 with facet arthropathy.  I think the first step would be diagnostic medial branch blocks of the lower lumbar facet joints at L4-5 and L5-S1.  If she obtained decent relief more than 70% relief diagnostically then we could look at further efforts of potential for radiofrequency ablation.  If it helped her back pain but not so much this left hip and thigh pain then we could look at either diagnostic L5 transforaminal injection versus sacroiliac joint injection.  Would also consider regrouping with physical therapy.    Meds & Orders: No orders of the defined types were placed in this encounter.  No orders of the defined types were placed in this encounter.   Follow-up: Return for Bilateral medial branch blocks at L4-5 L5-S1.   Procedures: No procedures performed      Clinical History: NEUROIMAGING REPORT   STUDY DATE: 11/07/20 PATIENT NAME: Tylea Hise DOB: 08/06/1946 MRN: [id number]  ORDERING CLINICIAN: Melvenia Beam, MD  CLINICAL HISTORY: 75 year old female with low back pain radiating to the left leg.  EXAM: MR LUMBAR SPINE WO CONTRAST  TECHNIQUE: MRI of the lumbar spine was obtained utilizing 4 mm sagittal slices from H47-65 down to the lower sacrum with T1, T2 and inversion recovery views. In addition 4 mm axial slices from Y6-5 down to L5-S1 level were included with T1 and T2 weighted views. CONTRAST: None COMPARISON: None IMAGING SITE: [facility] [address] (1.5 Tesla MRI) acute  FINDINGS:   On sagittal views the vertebral bodies have normal height and alignment.  Mild scoliosis convex left centered at L3-4 level.  Mild disc bulging and spondylosis at L2-3, L3-4 and  L5-S1.  The conus medullaris terminates at the level of L1.    On axial views:  T12-L1: No spinal stenosis or foraminal narrowing L1-2: No spinal stenosis or foraminal narrowing L2-3: Disc bulging and facet hypertrophy with mild bilateral foraminal stenosis L3-4: Disc bulging and facet hypertrophy with no spinal stenosis or foraminal narrowing L4-5: Disc bulging and facet hypertrophy and ligamentum flavum hypertrophy with no spinal stenosis or foraminal narrowing L5-S1: Disc bulging, facet and ligamentum flavum hypertrophy with moderate bilateral foraminal stenosis and lateral recess stenosis  Limited views of the aorta, kidneys, iliopsoas muscles and sacroiliac joints are unremarkable.   IMPRESSION:   MRI lumbar spine without contrast demonstrating: - At L5-S1: Disc bulging, facet and ligamentum flavum hypertrophy with moderate bilateral foraminal stenosis and lateral recess  stenosis - At L2-3: Disc bulging and facet hypertrophy with mild bilateral foraminal stenosis.    INTERPRETING PHYSICIAN:  Penni Bombard, MD Certified in Neurology, Neurophysiology and Neuroimaging   She reports that she has never smoked. She has never used smokeless tobacco.  Recent Labs    02/08/20 1137 10/24/20 1608  HGBA1C 5.6 5.6    Objective:  VS:  HT:    WT:   BMI:     BP:105/72  HR:70bpm  TEMP: ( )  RESP:  Physical Exam Vitals and nursing note reviewed.  Constitutional:      General: She is not in acute distress.    Appearance: Normal appearance. She is not ill-appearing.  HENT:     Head: Normocephalic and atraumatic.     Right Ear: External ear normal.     Left Ear: External ear normal.  Eyes:     Extraocular Movements: Extraocular movements intact.  Cardiovascular:     Rate and Rhythm: Normal rate.     Pulses: Normal pulses.  Pulmonary:     Effort: Pulmonary effort is normal. No respiratory distress.  Abdominal:     General: There is no distension.     Palpations:  Abdomen is soft.  Musculoskeletal:        General: Tenderness present. No deformity.     Cervical back: Neck supple.     Right lower leg: No edema.     Left lower leg: No edema.     Comments: Patient has good distal strength with no pain over the greater trochanters.  No clonus or focal weakness.  She has good proximal thigh strength.  She has no pain with hip internal rotation.  She does have pain with extension and facet loading of the lumbar spine.  There are trigger points and taut bands in the lower lumbar paraspinal musculature and quadratus lumborum.  She does have some pain over the left PSIS compared to the right.  Skin:    Findings: No bruising, erythema, lesion or rash.  Neurological:     General: No focal deficit present.     Mental Status: She is alert and oriented to person, place, and time.     Sensory: No sensory deficit.     Motor: No weakness or abnormal muscle tone.     Coordination: Coordination normal.     Gait: Gait abnormal.  Psychiatric:        Mood and Affect: Mood normal.        Behavior: Behavior normal.     Ortho Exam  Imaging: No results found.  Past Medical/Family/Surgical/Social History: Medications & Allergies reviewed per EMR, new medications updated. Patient Active Problem List   Diagnosis Date Noted  . Encounter for removal of sutures 02/23/2020  . Encounter for therapeutic drug monitoring 02/20/2020  . Atrial fibrillation ([place]) 02/20/2020  . S/P minimally-invasive mitral valve repair 02/10/2020  . Nonrheumatic mitral valve regurgitation   . Varicose veins of right lower extremity with complications 30/05/2329  . Paresthesias 07/29/2014  . Tremor 12/08/2013  . Personal history of breast cancer 09/06/2013  . Dense breast 09/06/2013  . Malignant neoplasm of lower-outer quadrant of right breast of female, estrogen receptor positive ([place]) 02/11/2012  . Severe mitral valve regurgitation 05/08/2009  . PREMATURE ATRIAL CONTRACTIONS 05/08/2009    Past Medical History:  Diagnosis Date  . Anxiety disorder   . Arthritis   . Breast cancer ([place])    s/p lumpectomy  . Cataract   . Dense breast 09/06/2013  .  Dysrhythmia    PACs  . Essential tremor    /familial  . Heart murmur    corrected with valve surgery  . Hypothyroidism   . Interstitial cystitis   . Malignant neoplasm of lower-outer quadrant of right breast of female, estrogen receptor positive ([place]) 02/11/2012  . Mitral valve disorder 05/08/2009   Qualifier: Diagnosis of  By: [name], RN, [name]    . MVP (mitral valve prolapse)   . Neuropathy   . Nonrheumatic mitral valve regurgitation   . PAC (premature atrial contraction)   . Palpitations   . Paresthesias 07/29/2014  . Personal history of breast cancer 09/06/2013  . PREMATURE ATRIAL CONTRACTIONS 05/08/2009   Qualifier: Diagnosis of  By: [name], RN, [name]    . Radiculopathy, cervical region   . RLS (restless legs syndrome)   . S/P minimally-invasive mitral valve repair 02/10/2020   Complex valvuloplasty including artificial Gore-tex neochord placement x8 with edge-to-edge suture plication of anterior commissure and 32 mm [name] 4D ring annuloplasty via right mini thoracotomy approach  . Thyroid disease   . Tremor 12/08/2013  . Varicose veins of right lower extremity with complications 0/62/6948   Family History  Problem Relation Age of Onset  . Coronary artery disease Mother        s/p MI (71s)  . Hypertension Mother   . Osteoporosis Mother        wrist fracture  . Alzheimer's disease Mother   . Osteoarthritis Father   . Neuropathy Father   . Tremor Father   . Other Father        lymphedema  . Fibromyalgia Sister   . Heart disease Maternal Grandmother   . Colon polyps Neg Hx   . Esophageal cancer Neg Hx   . Pancreatic cancer Neg Hx   . Stomach cancer Neg Hx   . Rectal cancer Neg Hx    Past Surgical History:  Procedure Laterality Date  . bilateral cataract repair  july/aug 2021   Dr Tommy Rainwater  . BREAST  BIOPSY  2009  . BREAST LUMPECTOMY    . CARDIAC CATHETERIZATION    . DILATION AND CURETTAGE OF UTERUS    . EYE SURGERY Left    Pterygium  . HEMANGIOMA EXCISION Left 1990   arm  . MELANOMA EXCISION Right 1980   thigh  . MITRAL VALVE REPAIR Right 02/10/2020   Procedure: MINIMALLY INVASIVE MITRAL VALVE REPAIR (MVR) using Memo 4D 32 MM Mitral Valve Ring.;  Surgeon: Rexene Alberts, MD;  Location: [place];  Service: Open Heart Surgery;  Laterality: Right;  . NECK SURGERY  10/2009   C5-6 fusion  . RIGHT/LEFT HEART CATH AND CORONARY ANGIOGRAPHY N/A 01/04/2020   Procedure: RIGHT/LEFT HEART CATH AND CORONARY ANGIOGRAPHY;  Surgeon: Burnell Blanks, MD;  Location: [facility];  Service: Cardiovascular;  Laterality: N/A;  . SHOULDER ARTHROSCOPY    . spider vein treatment    . TEE WITHOUT CARDIOVERSION N/A 06/30/2018   Procedure: TRANSESOPHAGEAL ECHOCARDIOGRAM (TEE);  Surgeon: Sueanne Margarita, MD;  Location: [facility] END[facility];  Service: Cardiovascular;  Laterality: N/A;  . TEE WITHOUT CARDIOVERSION N/A 12/12/2019   Procedure: TRANSESOPHAGEAL ECHOCARDIOGRAM (TEE);  Surgeon: Dorothy Spark, MD;  Location[facility];  Service: Cardiovascular;  Laterality: N/A;  . TEE WITHOUT CARDIOVERSION N/A 02/10/2020   Procedure: TRANSESOPHAGEAL ECHOCARDIOGRAM (TEE);  Surgeon: Rexene Alberts, MD;  Location: [place];  Service: Open Heart Surgery;  Laterality: N/A;  . TONSILLECTOMY    . uterine fibroids removed  Social History   Occupational History  . Not on file  Tobacco Use  . Smoking status: Never Smoker  . Smokeless tobacco: Never Used  Vaping Use  . Vaping Use: Never used  Substance and Sexual Activity  . Alcohol use: Yes    Comment: Socially  . Drug use: No  . Sexual activity: Not on file

## 2020-11-29 ENCOUNTER — Ambulatory Visit: Payer: PPO | Admitting: Physical Medicine and Rehabilitation

## 2020-11-29 ENCOUNTER — Other Ambulatory Visit: Payer: Self-pay

## 2020-11-29 ENCOUNTER — Encounter: Payer: Self-pay | Admitting: Physical Medicine and Rehabilitation

## 2020-11-29 ENCOUNTER — Ambulatory Visit: Payer: Self-pay

## 2020-11-29 VITALS — BP 116/77 | HR 76

## 2020-11-29 DIAGNOSIS — M47816 Spondylosis without myelopathy or radiculopathy, lumbar region: Secondary | ICD-10-CM | POA: Diagnosis not present

## 2020-11-29 DIAGNOSIS — M5442 Lumbago with sciatica, left side: Secondary | ICD-10-CM | POA: Diagnosis not present

## 2020-11-29 DIAGNOSIS — G8929 Other chronic pain: Secondary | ICD-10-CM

## 2020-11-29 MED ORDER — BUPIVACAINE HCL 0.5 % IJ SOLN: 3.0000 mL | Freq: Once | INTRAMUSCULAR | Status: DC

## 2020-11-29 NOTE — Progress Notes (Signed)
Kellie Humphrey - 75 y.o. female MRN [id number]  Date of birth: [birth year]  Office Visit Note: Visit Date: 11/29/2020 PCP: Ginger Organ., MD Referred by: Ginger Organ., MD  Subjective: Chief Complaint  Patient presents with  . Lower Back - Pain   HPI:  Kellie Humphrey is a 75 y.o. female who comes in today  for planned Bilateral  L4-L5 and L5-S1 Lumbar facet/medial branch block with fluoroscopic guidance.  The patient has failed conservative care including home exercise, medications, time and activity modification.  This injection will be diagnostic and hopefully therapeutic.  Please see requesting physician notes for further details and justification.  Exam has shown concordant pain with facet joint loading.  Referring provider: Dr. Sarina Ill   ROS Otherwise per HPI.  Assessment & Plan: Visit Diagnoses:    ICD-10-CM   1. Chronic bilateral low back pain with left-sided sciatica  M54.42 XR C-ARM NO REPORT   G89.29 Facet Injection    bupivacaine (MARCAINE) 0.5 % (with pres) injection 3 mL  2. Spondylosis without myelopathy or radiculopathy, lumbar region  M47.816 XR C-ARM NO REPORT    Facet Injection    bupivacaine (MARCAINE) 0.5 % (with pres) injection 3 mL    Plan: No additional findings.   Meds & Orders:  Meds ordered this encounter  Medications  . bupivacaine (MARCAINE) 0.5 % (with pres) injection 3 mL    Orders Placed This Encounter  Procedures  . Facet Injection  . XR C-ARM NO REPORT    Follow-up: Return for Review Pain Diary.   Procedures: No procedures performed  Lumbar Diagnostic Facet Joint Nerve Block with Fluoroscopic Guidance   Patient: Kellie Humphrey      Date of Birth: 01-Oct-1945 MRN: [id number] PCP: Ginger Organ., MD      Visit Date: 11/29/2020   Universal Protocol:    Date/Time: 04/05/225:38 AM  Consent Given By: the patient  Position: PRONE  Additional Comments: Vital signs were monitored before and after the  procedure. Patient was prepped and draped in the usual sterile fashion. The correct patient, procedure, and site was verified.   Injection Procedure Details:   Procedure diagnoses:  1. Chronic bilateral low back pain with left-sided sciatica   2. Spondylosis without myelopathy or radiculopathy, lumbar region      Meds Administered:  Meds ordered this encounter  Medications  . bupivacaine (MARCAINE) 0.5 % (with pres) injection 3 mL     Laterality: Bilateral  Location/Site: L4-L5, L3 and L4 medial branches and L5-S1, L4 medial branch and L5 dorsal ramus  Needle: 5.0 in., 25 ga.  Short bevel or Quincke spinal needle  Needle Placement: Oblique pedical  Findings:   -Comments: There was excellent flow of contrast along the articular pillars without intravascular flow.  Procedure Details: The fluoroscope beam is vertically oriented in AP and then obliqued 15 to 20 degrees to the ipsilateral side of the desired nerve to achieve the "Scotty dog" appearance.  The skin over the target area of the junction of the superior articulating process and the transverse process (sacral ala if blocking the L5 dorsal rami) was locally anesthetized with a 1 ml volume of 1% Lidocaine without Epinephrine.  The spinal needle was inserted and advanced in a trajectory view down to the target.   After contact with periosteum and negative aspirate for blood and CSF, correct placement without intravascular or epidural spread was confirmed by injecting 0.5 ml. of Isovue-250.  A spot  radiograph was obtained of this image.    Next, a 0.5 ml. volume of the injectate described above was injected. The needle was then redirected to the other facet joint nerves mentioned above if needed.  Prior to the procedure, the patient was given a Pain Diary which was completed for baseline measurements.  After the procedure, the patient rated their pain every 30 minutes and will continue rating at this frequency for a total of 5  hours.  The patient has been asked to complete the Diary and return to [place] by mail, fax or hand delivered as soon as possible.   Additional Comments:  The patient tolerated the procedure well Dressing: 2 x 2 sterile gauze and Band-Aid    Post-procedure details: Patient was observed during the procedure. Post-procedure instructions were reviewed.  Patient left the clinic in stable condition.     Clinical History: NEUROIMAGING REPORT   STUDY DATE: 11/07/20 PATIENT NAME: Kellie Humphrey DOB: 18-Jul-1946 MRN: [id number]  ORDERING CLINICIAN: Melvenia Beam, MD  CLINICAL HISTORY: 74 year old female with low back pain radiating to the left leg.  EXAM: MR LUMBAR SPINE WO CONTRAST  TECHNIQUE: MRI of the lumbar spine was obtained utilizing 4 mm sagittal slices from V25-36 down to the lower sacrum with T1, T2 and inversion recovery views. In addition 4 mm axial slices from U4-4 down to L5-S1 level were included with T1 and T2 weighted views. CONTRAST: None COMPARISON: None IMAGING SITE: [facility] [address] (1.5 Tesla MRI) acute  FINDINGS:   On sagittal views the vertebral bodies have normal height and alignment.  Mild scoliosis convex left centered at L3-4 level.  Mild disc bulging and spondylosis at L2-3, L3-4 and L5-S1.  The conus medullaris terminates at the level of L1.    On axial views:  T12-L1: No spinal stenosis or foraminal narrowing L1-2: No spinal stenosis or foraminal narrowing L2-3: Disc bulging and facet hypertrophy with mild bilateral foraminal stenosis L3-4: Disc bulging and facet hypertrophy with no spinal stenosis or foraminal narrowing L4-5: Disc bulging and facet hypertrophy and ligamentum flavum hypertrophy with no spinal stenosis or foraminal narrowing L5-S1: Disc bulging, facet and ligamentum flavum hypertrophy with moderate bilateral foraminal stenosis and lateral recess stenosis  Limited views of the aorta, kidneys, iliopsoas  muscles and sacroiliac joints are unremarkable.   IMPRESSION:   MRI lumbar spine without contrast demonstrating: - At L5-S1: Disc bulging, facet and ligamentum flavum hypertrophy with moderate bilateral foraminal stenosis and lateral recess stenosis - At L2-3: Disc bulging and facet hypertrophy with mild bilateral foraminal stenosis.    INTERPRETING PHYSICIAN:  Penni Bombard, MD Certified in Neurology, Neurophysiology and Neuroimaging     Objective:  VS:  HT:    WT:   BMI:     BP:116/77  HR:76bpm  TEMP: ( )  RESP:  Physical Exam Vitals and nursing note reviewed.  Constitutional:      General: She is not in acute distress.    Appearance: Normal appearance. She is not ill-appearing.  HENT:     Head: Normocephalic and atraumatic.     Right Ear: External ear normal.     Left Ear: External ear normal.  Eyes:     Extraocular Movements: Extraocular movements intact.  Cardiovascular:     Rate and Rhythm: Normal rate.     Pulses: Normal pulses.  Pulmonary:     Effort: Pulmonary effort is normal. No respiratory distress.  Abdominal:     General: There is no  distension.     Palpations: Abdomen is soft.  Musculoskeletal:        General: Tenderness present.     Cervical back: Neck supple.     Right lower leg: No edema.     Left lower leg: No edema.     Comments: Patient has good distal strength with no pain over the greater trochanters.  No clonus or focal weakness. Patient somewhat slow to rise from a seated position to full extension.  There is concordant low back pain with facet loading and lumbar spine extension rotation.  There are no definitive trigger points but the patient is somewhat tender across the lower back and PSIS.  There is no pain with hip rotation.   Skin:    Findings: No erythema, lesion or rash.  Neurological:     General: No focal deficit present.     Mental Status: She is alert and oriented to person, place, and time.     Sensory: No  sensory deficit.     Motor: No weakness or abnormal muscle tone.     Coordination: Coordination normal.  Psychiatric:        Mood and Affect: Mood normal.        Behavior: Behavior normal.      Imaging: No results found.

## 2020-11-29 NOTE — Progress Notes (Signed)
Pt state left lower back pain. Pt state standing makes the pain worse. Pt state she take pain meds and use heating pads to ease her pain.  Numeric Pain Rating Scale and Functional Assessment Average Pain 2   In the last MONTH (on 0-10 scale) has pain interfered with the following?  1. General activity like being  able to carry out your everyday physical activities such as walking, climbing stairs, carrying groceries, or moving a chair?  Rating(5)   +Driver, -BT, -Dye Allergies.

## 2020-11-29 NOTE — Patient Instructions (Signed)

## 2020-11-30 ENCOUNTER — Inpatient Hospital Stay: Payer: PPO | Attending: Gynecologic Oncology | Admitting: Gynecologic Oncology

## 2020-11-30 ENCOUNTER — Other Ambulatory Visit: Payer: Self-pay

## 2020-11-30 ENCOUNTER — Encounter: Payer: Self-pay | Admitting: Gynecologic Oncology

## 2020-11-30 VITALS — HR 77 | Ht 67.0 in | Wt 133.2 lb

## 2020-11-30 DIAGNOSIS — Z170 Estrogen receptor positive status [ER+]: Secondary | ICD-10-CM | POA: Insufficient documentation

## 2020-11-30 DIAGNOSIS — Z853 Personal history of malignant neoplasm of breast: Secondary | ICD-10-CM | POA: Diagnosis not present

## 2020-11-30 DIAGNOSIS — Z9229 Personal history of other drug therapy: Secondary | ICD-10-CM | POA: Insufficient documentation

## 2020-11-30 DIAGNOSIS — Z7982 Long term (current) use of aspirin: Secondary | ICD-10-CM | POA: Diagnosis not present

## 2020-11-30 DIAGNOSIS — Z79899 Other long term (current) drug therapy: Secondary | ICD-10-CM | POA: Insufficient documentation

## 2020-11-30 DIAGNOSIS — N83201 Unspecified ovarian cyst, right side: Secondary | ICD-10-CM | POA: Diagnosis not present

## 2020-11-30 DIAGNOSIS — I491 Atrial premature depolarization: Secondary | ICD-10-CM | POA: Insufficient documentation

## 2020-11-30 DIAGNOSIS — I341 Nonrheumatic mitral (valve) prolapse: Secondary | ICD-10-CM | POA: Insufficient documentation

## 2020-11-30 DIAGNOSIS — I340 Nonrheumatic mitral (valve) insufficiency: Secondary | ICD-10-CM | POA: Insufficient documentation

## 2020-11-30 NOTE — Patient Instructions (Signed)
It was a pleasure meeting you today. I am recommending no surgery based on the benign appearance of your cysts and relatively stable size over 1 year of imaging.   Please don't hesitate to call the office with any questions or concerns in the future: [phone].

## 2020-11-30 NOTE — Progress Notes (Signed)
GYNECOLOGIC ONCOLOGY NEW PATIENT CONSULTATION   Patient Name: Kellie Humphrey  Patient Age: 75 y.o. Date of Service: 11/30/2020 Referring Provider: Everlene Farrier, MD  Primary Care Provider: Ginger Organ., MD Consulting Provider: Jeral Pinch, MD   Assessment/Plan:  Postmenopausal patient with a remote history of breast cancer incidentally found to have simple appearing cystic lesion on imaging almost a year ago with no significant change or concerning features on ultrasound surveillance.  I discussed with the patient in detail today findings from her recent ultrasound.  I only have the most recent February ultrasound, although the patient tells me that she has had 2 or 3 other ultrasounds at her gynecologist's office in the last year since the right ovarian cyst was initially noted on CT scan.  Although I cannot see the images, based on the description, ovarian lesions appear cystic and simple.  Given no significant change in size or character with overall benign features on imaging, I think the risk of malignancy is extremely low.  Additionally, the patient notes having tumor markers done.  She has not heard the results of these and I do not see them in the records we saved.  I will have my office reach out to obtain these results as well as previous ultrasounds.  Given findings, I do not recommend surgery at this time and would decrease the frequency with which the patient is having imaging surveillance.  I think it would be very reasonable to repeat imaging with an ultrasound in 6-12 months.  If follow-up imaging reveals no significant change in size or character of ovarian cysts, I do not think that routine imaging surveillance moving forward is necessary.  If the patient were to have a change in the appearance of her mass and/or new symptoms, then she may require additional imaging or possible surgical intervention for diagnostic and therapeutic purposes.  Patient and I also talked  about her recent Pap test.  Given no history of high-grade dysplasia and her age, I would not recommend continued cervical cancer screening.  Her unsatisfactory Pap test is likely related to vaginal atrophy in the setting of her age.  Patient knows that I will call her if any of our discussion or my recommendations change once I receive additional ultrasounds as well as lab testing from Kellie Humphrey office.  A copy of this note was sent to the patient's referring provider.   65 minutes of total time was spent for this patient encounter, including preparation, face-to-face counseling with the patient and coordination of care, and documentation of the encounter.  Kellie Pinch, MD  Division of Gynecologic Oncology  Department of Obstetrics and Gynecology  [facility]  ___________________________________________  Chief Complaint: Chief Complaint  Patient presents with  . Cyst of right ovary    History of Present Illness:  Kellie Humphrey is a 75 y.o. y.o. female who is seen in consultation at the request of Kellie Humphrey for an evaluation of adnexal mass.  Patient reports that last year, as preoperative work-up before surgery for mitral valve repair, she had an abdominal and pelvic CT.  On that CT scan, there were cysts a cyst seen on one of her ovaries.  She has been followed with serial imaging since with her GYN, and presents today for discussion of treatment options.  Overall she reports doing very well.  She endorses a good appetite without any early satiety, bloating, nausea, or emesis.  She denies any changes to her bowel  or bladder function.  She denies any abdominal or pelvic pain.  She has some neuropathy, back, and leg pain, with for which she is currently undergoing work-up.  Patient history is notable for estrogen receptor positive breast cancer, treated a number of years ago.  She was on antiestrogen therapy with tamoxifen until approximately 2019.   She denies having genetic testing related to this diagnosis.   The patient's most recent [facility] on 2/7, shows uterus measuring 7.3 x 2.8 x 4.2 cm with a lining of 3.8 mm.  Right ovary measures 5.4 x 5.3 x 5.8 cm, left ovary measures 3.3 x 1.4 x 2.2 cm.  There are multiple simple cystic areas noted within the right ovary and a simple appearing 1.7 cm cyst within the left ovary.  No free fluid noted.  There is a 2.2 x 2.4 cm uterine fibroid also noted.  PAST MEDICAL HISTORY:  Past Medical History:  Diagnosis Date  . Anxiety disorder   . Arthritis   . Breast cancer ([place])    s/p lumpectomy  . Cataract   . Dense breast 09/06/2013  . Dysrhythmia    PACs  . Essential tremor    /familial  . Heart murmur    corrected with valve surgery  . Hypothyroidism   . Interstitial cystitis   . Malignant neoplasm of lower-outer quadrant of right breast of female, estrogen receptor positive ([place]) 02/11/2012  . Mitral valve disorder 05/08/2009   Qualifier: Diagnosis of  By: Kellie Fillers, RN, Kellie Humphrey    . MVP (mitral valve prolapse)   . Neuropathy   . Nonrheumatic mitral valve regurgitation   . PAC (premature atrial contraction)   . Palpitations   . Paresthesias 07/29/2014  . Personal history of breast cancer 09/06/2013  . PREMATURE ATRIAL CONTRACTIONS 05/08/2009   Qualifier: Diagnosis of  By: Kellie Fillers, RN, Kellie Humphrey    . Radiculopathy, cervical region   . RLS (restless legs syndrome)   . S/P minimally-invasive mitral valve repair 02/10/2020   Complex valvuloplasty including artificial Gore-tex neochord placement x8 with edge-to-edge suture plication of anterior commissure and 32 mm [name] 4D ring annuloplasty via right mini thoracotomy approach  . Thyroid disease   . Tremor 12/08/2013  . Varicose veins of right lower extremity with complications 4/40/1027     PAST SURGICAL HISTORY:  Past Surgical History:  Procedure Laterality Date  . bilateral cataract repair   july/aug 2021   Dr Kellie Humphrey  . BREAST BIOPSY  2009  . BREAST LUMPECTOMY    . CARDIAC CATHETERIZATION    . DILATION AND CURETTAGE OF UTERUS    . EYE SURGERY Left    Pterygium  . HEMANGIOMA EXCISION Left 1990   arm  . MELANOMA EXCISION Right 1980   thigh  . MITRAL VALVE REPAIR Right 02/10/2020   Procedure: MINIMALLY INVASIVE MITRAL VALVE REPAIR (MVR) using Memo 4D 32 MM Mitral Valve Ring.;  Surgeon: Rexene Alberts, MD;  Location: [facility];  Service: Open Heart Surgery;  Laterality: Right;  . NECK SURGERY  10/2009   C5-6 fusion  . RIGHT/LEFT HEART CATH AND CORONARY ANGIOGRAPHY N/A 01/04/2020   Procedure: RIGHT/LEFT HEART CATH AND CORONARY ANGIOGRAPHY;  Surgeon: Burnell Blanks, MD;  Location: [facility];  Service: Cardiovascular;  Laterality: N/A;  . SHOULDER ARTHROSCOPY    . spider vein treatment    . TEE WITHOUT CARDIOVERSION N/A 06/30/2018   Procedure: TRANSESOPHAGEAL ECHOCARDIOGRAM (TEE);  Surgeon: Sueanne Margarita, MD;  Location:  [facility];  Service: Cardiovascular;  Laterality: N/A;  . TEE WITHOUT CARDIOVERSION N/A 12/12/2019   Procedure: TRANSESOPHAGEAL ECHOCARDIOGRAM (TEE);  Surgeon: Dorothy Spark, MD;  Location: [facility];  Service: Cardiovascular;  Laterality: N/A;  . TEE WITHOUT CARDIOVERSION N/A 02/10/2020   Procedure: TRANSESOPHAGEAL ECHOCARDIOGRAM (TEE);  Surgeon: Rexene Alberts, MD;  Location: [facility];  Service: Open Heart Surgery;  Laterality: N/A;  . TONSILLECTOMY    . uterine fibroids removed      OB/GYN HISTORY:  OB History  Gravida Para Term Preterm AB Living  4 2          SAB IAB Ectopic Multiple Live Births               # Outcome Date GA Lbr Len/2nd Weight Sex Delivery Anes PTL Lv  4 Gravida           3 Gravida           2 Para           1 Para             No LMP recorded. Patient is postmenopausal.  Age at menarche: 11 Age at menopause: Approximately 23, denies any postmenopausal bleeding Hx of HRT: Yes.  Additionally was on  tamoxifen for approximately 9 years until 2019. Hx of STDs: Denies Last pap: 2022, reported to be unsatisfactory due to extremely scant cellularity. History of abnormal pap smears: Notes having a Pap smear that was unsatisfactory last year as well, otherwise denies any history of abnormal Paps.  SCREENING STUDIES:  Last mammogram: 08/2020  Last colonoscopy: 2019  MEDICATIONS: Outpatient Encounter Medications as of 11/30/2020  Medication Sig  . acetaminophen (TYLENOL) 500 MG tablet Take 500-1,000 mg by mouth See admin instructions. Take 500 mg at night, may take a second 500 -1000 mg dose as needed for pain  . aspirin EC 81 MG tablet Take 1 tablet (81 mg total) by mouth daily. Swallow whole.  . Biotin 5000 MCG CAPS Take 5,000 mcg by mouth every evening.  . Cholecalciferol (VITAMIN D) 2000 units tablet Take 4,000 Units by mouth daily.  . Cyanocobalamin (B-12) 5000 MCG CAPS Take 5,000 mcg by mouth once a week. Mondays & Fridays.  . flecainide (TAMBOCOR) 50 MG tablet Take 1 tablet (50 mg total) by mouth 2 (two) times daily.  [name] gabapentin (NEURONTIN) 600 MG tablet Take 600 mg by mouth 2 (two) times daily.  [name] ibuprofen (ADVIL) 200 MG tablet Take 400 mg by mouth as needed.  [name] levothyroxine (SYNTHROID) 50 MCG tablet Take 1 tablet (50 mcg total) by mouth daily before breakfast.  . melatonin 5 MG TABS Take 5 mg by mouth.  . metoprolol tartrate (LOPRESSOR) 25 MG tablet Take 0.5 tablets (12.5 mg total) by mouth 2 (two) times daily.  . nitrofurantoin (MACRODANTIN) 50 MG capsule Take 1 capsule (50 mg total) by mouth daily.  . Omega-3 Fatty Acids (FISH OIL) 1000 MG CAPS Take by mouth in the morning and at bedtime.  . primidone (MYSOLINE) 50 MG tablet Take 50 mg by mouth at bedtime.  . SYSTANE ULTRA 0.4-0.3 % SOLN Place 1 drop into the left eye every 4 (four) hours as needed (dry/irritated eyes. (4-6 times daily)).   . TURMERIC PO Take by mouth.  . diclofenac sodium (VOLTAREN) 1 % GEL APPLY 2-4 GRAMS TO  LARGE JOINT AREA UP TO FOUR TIMES A DAY AS NEEDED (Patient not taking: Reported on 11/30/2020)   Facility-Administered Encounter Medications as of  11/30/2020  Medication  . bupivacaine (MARCAINE) 0.5 % (with pres) injection 3 mL    ALLERGIES:  Allergies  Allergen Reactions  . Codeine Nausea And Vomiting     FAMILY HISTORY:  Family History  Problem Relation Age of Onset  . Coronary artery disease Mother        s/p MI (56s)  . Hypertension Mother   . Osteoporosis Mother        wrist fracture  . Alzheimer's disease Mother   . Osteoarthritis Father   . Neuropathy Father   . Tremor Father   . Other Father        lymphedema  . Fibromyalgia Sister   . Heart disease Maternal Grandmother   . Colon polyps Neg Hx   . Esophageal cancer Neg Hx   . Pancreatic cancer Neg Hx   . Stomach cancer Neg Hx   . Rectal cancer Neg Hx   . Ovarian cancer Neg Hx   . Breast cancer Neg Hx   . Colon cancer Neg Hx   . Prostate cancer Neg Hx   . Endometrial cancer Neg Hx      SOCIAL HISTORY:  Social Connections: Not on file    REVIEW OF SYSTEMS:  Denies appetite changes, fevers, chills, fatigue, unexplained weight changes. Denies hearing loss, neck lumps or masses, mouth sores, ringing in ears or voice changes. Denies cough or wheezing.  Denies shortness of breath. Denies chest pain or palpitations. Denies leg swelling. Denies abdominal distention, pain, blood in stools, constipation, diarrhea, nausea, vomiting, or early satiety. Denies pain with intercourse, dysuria, frequency, hematuria or incontinence. Denies hot flashes, pelvic pain, vaginal bleeding or vaginal discharge.   Denies joint pain, back pain or muscle pain/cramps. Denies itching, rash, or wounds. Denies dizziness, headaches, numbness or seizures. Denies swollen lymph nodes or glands, denies easy bruising or bleeding. Denies anxiety, depression, confusion, or decreased concentration.  Physical Exam:  Vital Signs for this  encounter:  Pulse 77, height 5\' 7"  (1.702 m), weight 133 lb 3.2 oz (60.4 kg), SpO2 100 %. Body mass index is 20.86 kg/m. General: Alert, oriented, no acute distress.  HEENT: Normocephalic, atraumatic. Sclera anicteric.  Chest: Unlabored breathing on room air.  LABORATORY AND RADIOLOGIC DATA:  Outside medical records were reviewed to synthesize the above history, along with the history and physical obtained during the visit.   Lab Results  Component Value Date   WBC 5.1 10/24/2020   HGB 13.8 10/24/2020   HCT 40.8 10/24/2020   PLT 249 10/24/2020   GLUCOSE 84 10/24/2020   ALT 14 10/24/2020   AST 22 10/24/2020   NA 141 10/24/2020   K 4.1 10/24/2020   CL 102 10/24/2020   CREATININE 0.77 10/24/2020   BUN 10 10/24/2020   CO2 23 10/24/2020   TSH 1.950 10/24/2020   INR 2.0 06/12/2020   HGBA1C 5.6 10/24/2020   CT A/P in 12/2019: 1. No evidence of thoracic or abdominal aortic aneurysm or dissection. 2. Stable hepatic cysts. 3. 5 cm right ovarian cystic abnormality is noted. Pelvic ultrasound is recommended for further evaluation.

## 2020-12-04 ENCOUNTER — Encounter: Payer: Self-pay | Admitting: Gynecologic Oncology

## 2020-12-04 ENCOUNTER — Telehealth: Payer: Self-pay | Admitting: Gynecologic Oncology

## 2020-12-04 NOTE — Telephone Encounter (Signed)
Received additional records from gyn office:  Pelvic ultrasound 04/19/20: Uterus with 3.1x2.8cm fundal fibroid, lining 2.74mm. Right ovary with multiple simple appearing cysts (4.8x3.9cm, 2.4x1.3cm, 1.9cm). Left ovary with simple appearing cyst (1.3x1.1cm).  Pelvic ultrasound from 01/19/20: Uterus with 3.6cm fundal fibroid. Lining is 3.1mm. Right ovary is 5.6x4x5cm with multiple simple cysts and possible elongated cystic tubular structure concerning for hydrosalpinx. Left ovary with 1.5x1.2cm simple cyst.   Pelvic ultrasound 07/12/2009: Uterus with 2.7cm fibroid, lining is 3.34mm. Left ovary with 52mm simple cyst, right with 54mm and 53mm simple cysts.   CA-125: 11.9 on 3/2

## 2020-12-05 NOTE — Progress Notes (Addendum)
History: EMG right arm and right leg.  Patient reported numbness in the fingers and tingling and cold in the hands, right leg with pins-and-needles from the knee down, feet feel very hot and tingling.  Both legs feel heavy and weak, she has low back pain, she feels it is difficult that her legs are very weak.  She also feels aches in her proximal arms.  MRI of the lumbar spine showed no significant etiologies of her symptoms.  MRI of the cervical spine and brain were ordered.  Extensive blood work was ordered.  Labs included the following and are all unremarkable: Acetylcholine receptor antibodies, multiple myeloma panel, heavy metals in the blood, CK, sed rate, TSH, vitamin B6, B12 and folate, vitamin B1, IMA globin A1c, ANA, CMP, CBC, magnesium, Sjogren's, rheumatoid factor.   I reviewed all of th eabove with patient and discussed normal EMG/NCS. She says her leg feels "achy". Leg swells and has to raise it by the end of the evening. She discussed with Dr. Brigitte Pulse. She was sent for a vascular study. She is on Gabapentin twice a day. We discussed the MRI of her brain to ensure no stroke but this is less likely. Also she had surgery on her neck, we could check to ensure no new adjacent disease or new cervical stenosis. Her B12 was elevated, she can reduce her B12 intake it is 1675. Also she takes primidone for tremor, unclear if helping, I recommended she take 1/2 a pill then stop and if her tremor worsens then it probably was helping her and she needs a higher dose. She also has varicose veins, and swelling could she have a mild case of venous insufficiency causing the "achy" feeling? Discuss with Dr. Brigitte Pulse. She declines MRI brain but she agrees to MRI cervical spine, asked her to call [facility] and schedule.   I spent over 20 minutes of face-to-face and non-face-to-face time with patient on the  1. Proximal limb weakness   2. Numbness and tingling of lower extremity   3. Bilateral arm weakness   4.  Numbness and tingling of both feet   5. Raynaud's phenomenon without gangrene   6. Neurogenic claudication ([place])   7. Abnormal gait due to muscle weakness   8. Spinal stenosis of lumbosacral region   9. Lumbar stenosis with neurogenic claudication    diagnosis.  This included previsit chart review, lab review, study review, order entry, electronic health record documentation, patient education on the different diagnostic and therapeutic options, counseling and coordination of care, risks and benefits of management, compliance, or risk factor reduction. This does not include time spent on emg/ncs.

## 2020-12-06 ENCOUNTER — Ambulatory Visit (INDEPENDENT_AMBULATORY_CARE_PROVIDER_SITE_OTHER): Payer: PPO | Admitting: Neurology

## 2020-12-06 DIAGNOSIS — Z0289 Encounter for other administrative examinations: Secondary | ICD-10-CM

## 2020-12-06 DIAGNOSIS — M6289 Other specified disorders of muscle: Secondary | ICD-10-CM | POA: Diagnosis not present

## 2020-12-06 DIAGNOSIS — R200 Anesthesia of skin: Secondary | ICD-10-CM

## 2020-12-06 DIAGNOSIS — R202 Paresthesia of skin: Secondary | ICD-10-CM

## 2020-12-06 DIAGNOSIS — G9519 Other vascular myelopathies: Secondary | ICD-10-CM

## 2020-12-06 DIAGNOSIS — R29898 Other symptoms and signs involving the musculoskeletal system: Secondary | ICD-10-CM

## 2020-12-06 DIAGNOSIS — I7300 Raynaud's syndrome without gangrene: Secondary | ICD-10-CM | POA: Diagnosis not present

## 2020-12-06 DIAGNOSIS — M6281 Muscle weakness (generalized): Secondary | ICD-10-CM | POA: Diagnosis not present

## 2020-12-06 DIAGNOSIS — M4807 Spinal stenosis, lumbosacral region: Secondary | ICD-10-CM

## 2020-12-06 DIAGNOSIS — M48062 Spinal stenosis, lumbar region with neurogenic claudication: Secondary | ICD-10-CM | POA: Diagnosis not present

## 2020-12-06 NOTE — Procedures (Signed)
Full Name: Kellie Humphrey Gender: Female MRN #: [id number] Date of Birth: November 24, 1945    Visit Date: 12/06/2020 09:44 Age: 75 Years Examining Physician: Sarina Ill, MD  Referring Physician: Marton Redwood, MD   History: Patient reported aching legs, weakness, numbness in the fingers and tingling and cold in the hands,  feet feel very hot and tingling.  Both legs feel heavy and weak, the right one aches and swells, she has low back pain, she feels it is difficult that her legs are very weak.  She also feels aches in her proximal arms.   Summary: EMG/NCS was performed on the right arm and right leg. All nerves and muscles (as indicated in the following tables) were within normal limits.    Conclusion: This is a normal study  ------------------------------- Sarina Ill, M.D.  [facility] [address], [place], [place] [address] Tel: [phone] Fax: [phone]  Verbal informed consent was obtained from the patient, patient was informed of potential risk of procedure, including bruising, bleeding, hematoma formation, infection, muscle weakness, muscle pain, numbness, among others.        [place]    Nerve / Sites Muscle Latency Ref. Amplitude Ref. Rel Amp Segments Distance Velocity Ref. Area    ms ms mV mV %  cm m/s m/s mVms  R Median - APB     Wrist APB 3.7 ?4.4 6.3 ?4.0 100 Wrist - APB 7   35.9     Upper arm APB 8.1  6.3  100 Upper arm - Wrist 24 54 ?49 35.2  R Ulnar - ADM     Wrist ADM 3.0 ?3.3 10.3 ?6.0 100 Wrist - ADM 7   50.3     B.Elbow ADM 7.0  10.4  101 B.Elbow - Wrist 20 51 ?49 50.4     A.Elbow ADM 9.0  10.3  98.7 A.Elbow - B.Elbow 10 49 ?49 51.6  R Peroneal - EDB     Ankle EDB 5.0 ?6.5 5.3 ?2.0 100 Ankle - EDB 9   30.0     Fib head EDB 10.3  4.8  90.7 Fib head - Ankle 29 55 ?44 28.2     Pop fossa EDB 12.2  5.1  105 Pop fossa - Fib head 10 52 ?44 29.5         Pop fossa - Ankle      R Tibial - AH     Ankle AH 4.8 ?5.8 13.2 ?4.0 100 Ankle  - AH 9   31.2     Pop fossa AH 13.3  9.2  69.6 Pop fossa - Ankle 40 47 ?41 30.2             SNC    Nerve / Sites Rec. Site Peak Lat Ref.  Amp Ref. Segments Distance Peak Diff Ref.    ms ms V V  cm ms ms  R Sural - Ankle (Calf)     Calf Ankle 4.1 ?4.4 10 ?6 Calf - Ankle 14    R Superficial peroneal - Ankle     Lat leg Ankle 3.8 ?4.4 6 ?6 Lat leg - Ankle 14    R Median, Ulnar - Transcarpal comparison     Median Palm Wrist 2.4 ?2.2 89 ?35 Median Palm - Wrist 8       Ulnar Palm Wrist 2.4 ?2.2 26 ?12 Ulnar Palm - Wrist 8          Median Palm - Ulnar Palm  -  0.0 ?0.4  R Median - Orthodromic (Dig II, Mid palm)     Dig II Wrist 3.4 ?3.4 17 ?10 Dig II - Wrist 13    R Ulnar - Orthodromic, (Dig V, Mid palm)     Dig V Wrist 3.1 ?3.1 12 ?5 Dig V - Wrist 33                 F  Wave    Nerve F Lat Ref.   ms ms  R Tibial - AH 55.8 ?56.0  R Ulnar - ADM 30.5 ?32.0         EMG Summary Table    Spontaneous MUAP Recruitment  Muscle IA Fib PSW Fasc Other Amp Dur. Poly Pattern  R. Deltoid Normal None None None _______ Normal Normal Normal Normal  R. Triceps brachii Normal None None None _______ Normal Normal Normal Normal  R. Biceps brachii Normal None None None _______ Normal Normal Normal Normal  R. Pronator teres Normal None None None _______ Normal Normal Normal Normal  R. First dorsal interosseous Normal None None None _______ Normal Normal Normal Normal  R. Iliopsoas Normal None None None _______ Normal Normal Normal Normal  R. Vastus medialis Normal None None None _______ Normal Normal Normal Normal  R. Tibialis anterior Normal None None None _______ Normal Normal Normal Normal  R. Abductor hallucis Normal None None None _______ Normal Normal Normal Normal  R. Extensor hallucis longus Normal None None None _______ Normal Normal Normal Normal  R. Gastrocnemius (Medial head) Normal None None None _______ Normal Normal Normal Normal  R. Thoracic paraspinals (mid) Normal None None None _______  Normal Normal Normal Normal  R. Biceps femoris (long head) Normal None None None _______ Normal Normal Normal Normal  R. Gluteus maximus Normal None None None _______ Normal Normal Normal Normal  R. Gluteus medius Normal None None None _______ Normal Normal Normal Normal  R. Lumbar paraspinals (low) Normal None None None _______ Normal Normal Normal Normal

## 2020-12-06 NOTE — Progress Notes (Signed)
Full Name: Kellie Humphrey Gender: Female MRN #: [id number] Date of Birth: Jun 23, 1946    Visit Date: 12/06/2020 09:44 Age: 75 Years Examining Physician: Sarina Ill, MD  Referring Physician: Marton Redwood, MD   History: Patient reported aching legs, weakness, numbness in the fingers and tingling and cold in the hands,  feet feel very hot and tingling.  Both legs feel heavy and weak, the right one aches and swells, she has low back pain, she feels it is difficult that her legs are very weak.  She also feels aches in her proximal arms.   Summary: EMG/NCS was performed on the right arm and right leg. All nerves and muscles (as indicated in the following tables) were within normal limits.    Conclusion: This is a normal study  ------------------------------- Sarina Ill, M.D.  [facility] [address] [place], [place] [address] Tel: [phone] Fax: [phone]  Verbal informed consent was obtained from the patient, patient was informed of potential risk of procedure, including bruising, bleeding, hematoma formation, infection, muscle weakness, muscle pain, numbness, among others.        Maurice    Nerve / Sites Muscle Latency Ref. Amplitude Ref. Rel Amp Segments Distance Velocity Ref. Area    ms ms mV mV %  cm m/s m/s mVms  R Median - APB     Wrist APB 3.7 ?4.4 6.3 ?4.0 100 Wrist - APB 7   35.9     Upper arm APB 8.1  6.3  100 Upper arm - Wrist 24 54 ?49 35.2  R Ulnar - ADM     Wrist ADM 3.0 ?3.3 10.3 ?6.0 100 Wrist - ADM 7   50.3     B.Elbow ADM 7.0  10.4  101 B.Elbow - Wrist 20 51 ?49 50.4     A.Elbow ADM 9.0  10.3  98.7 A.Elbow - B.Elbow 10 49 ?49 51.6  R Peroneal - EDB     Ankle EDB 5.0 ?6.5 5.3 ?2.0 100 Ankle - EDB 9   30.0     Fib head EDB 10.3  4.8  90.7 Fib head - Ankle 29 55 ?44 28.2     Pop fossa EDB 12.2  5.1  105 Pop fossa - Fib head 10 52 ?44 29.5         Pop fossa - Ankle      R Tibial - AH     Ankle AH 4.8 ?5.8 13.2 ?4.0 100  Ankle - AH 9   31.2     Pop fossa AH 13.3  9.2  69.6 Pop fossa - Ankle 40 47 ?41 30.2             SNC    Nerve / Sites Rec. Site Peak Lat Ref.  Amp Ref. Segments Distance Peak Diff Ref.    ms ms V V  cm ms ms  R Sural - Ankle (Calf)     Calf Ankle 4.1 ?4.4 10 ?6 Calf - Ankle 14    R Superficial peroneal - Ankle     Lat leg Ankle 3.8 ?4.4 6 ?6 Lat leg - Ankle 14    R Median, Ulnar - Transcarpal comparison     Median Palm Wrist 2.4 ?2.2 89 ?35 Median Palm - Wrist 8       Ulnar Palm Wrist 2.4 ?2.2 26 ?12 Ulnar Palm - Wrist 8          Median Palm - Ulnar Palm  -  0.0 ?0.4  R Median - Orthodromic (Dig II, Mid palm)     Dig II Wrist 3.4 ?3.4 17 ?10 Dig II - Wrist 13    R Ulnar - Orthodromic, (Dig V, Mid palm)     Dig V Wrist 3.1 ?3.1 12 ?5 Dig V - Wrist 34                 F  Wave    Nerve F Lat Ref.   ms ms  R Tibial - AH 55.8 ?56.0  R Ulnar - ADM 30.5 ?32.0         EMG Summary Table    Spontaneous MUAP Recruitment  Muscle IA Fib PSW Fasc Other Amp Dur. Poly Pattern  R. Deltoid Normal None None None _______ Normal Normal Normal Normal  R. Triceps brachii Normal None None None _______ Normal Normal Normal Normal  R. Biceps brachii Normal None None None _______ Normal Normal Normal Normal  R. Pronator teres Normal None None None _______ Normal Normal Normal Normal  R. First dorsal interosseous Normal None None None _______ Normal Normal Normal Normal  R. Iliopsoas Normal None None None _______ Normal Normal Normal Normal  R. Vastus medialis Normal None None None _______ Normal Normal Normal Normal  R. Tibialis anterior Normal None None None _______ Normal Normal Normal Normal  R. Abductor hallucis Normal None None None _______ Normal Normal Normal Normal  R. Extensor hallucis longus Normal None None None _______ Normal Normal Normal Normal  R. Gastrocnemius (Medial head) Normal None None None _______ Normal Normal Normal Normal  R. Thoracic paraspinals (mid) Normal None None None  _______ Normal Normal Normal Normal  R. Biceps femoris (long head) Normal None None None _______ Normal Normal Normal Normal  R. Gluteus maximus Normal None None None _______ Normal Normal Normal Normal  R. Gluteus medius Normal None None None _______ Normal Normal Normal Normal  R. Lumbar paraspinals (low) Normal None None None _______ Normal Normal Normal Normal

## 2020-12-06 NOTE — Progress Notes (Signed)
See procedure note.

## 2020-12-06 NOTE — Patient Instructions (Signed)

## 2020-12-19 ENCOUNTER — Ambulatory Visit
Admission: RE | Admit: 2020-12-19 | Discharge: 2020-12-19 | Disposition: A | Discharge status: Home / Self Care | Payer: PPO | Source: Ambulatory Visit | Attending: Neurology | Admitting: Neurology

## 2020-12-19 ENCOUNTER — Other Ambulatory Visit: Payer: Self-pay

## 2020-12-19 DIAGNOSIS — G9519 Other vascular myelopathies: Secondary | ICD-10-CM

## 2020-12-19 DIAGNOSIS — R269 Unspecified abnormalities of gait and mobility: Secondary | ICD-10-CM

## 2020-12-19 DIAGNOSIS — R200 Anesthesia of skin: Secondary | ICD-10-CM

## 2020-12-19 DIAGNOSIS — M6289 Other specified disorders of muscle: Secondary | ICD-10-CM

## 2020-12-19 DIAGNOSIS — R531 Weakness: Secondary | ICD-10-CM | POA: Diagnosis not present

## 2020-12-19 DIAGNOSIS — R202 Paresthesia of skin: Secondary | ICD-10-CM | POA: Diagnosis not present

## 2020-12-19 DIAGNOSIS — M6281 Muscle weakness (generalized): Secondary | ICD-10-CM

## 2020-12-19 DIAGNOSIS — Z853 Personal history of malignant neoplasm of breast: Secondary | ICD-10-CM | POA: Diagnosis not present

## 2020-12-19 DIAGNOSIS — R29898 Other symptoms and signs involving the musculoskeletal system: Secondary | ICD-10-CM

## 2020-12-19 DIAGNOSIS — R29818 Other symptoms and signs involving the nervous system: Secondary | ICD-10-CM

## 2021-01-01 NOTE — Procedures (Signed)
Lumbar Diagnostic Facet Joint Nerve Block with Fluoroscopic Guidance   Patient: Kellie Humphrey      Date of Birth: 1946-05-13 MRN: [id number] PCP: Ginger Organ., MD      Visit Date: 11/29/2020   Universal Protocol:    Date/Time: 04/05/225:38 AM  Consent Given By: the patient  Position: PRONE  Additional Comments: Vital signs were monitored before and after the procedure. Patient was prepped and draped in the usual sterile fashion. The correct patient, procedure, and site was verified.   Injection Procedure Details:   Procedure diagnoses:  1. Chronic bilateral low back pain with left-sided sciatica   2. Spondylosis without myelopathy or radiculopathy, lumbar region      Meds Administered:  Meds ordered this encounter  Medications  . bupivacaine (MARCAINE) 0.5 % (with pres) injection 3 mL     Laterality: Bilateral  Location/Site: L4-L5, L3 and L4 medial branches and L5-S1, L4 medial branch and L5 dorsal ramus  Needle: 5.0 in., 25 ga.  Short bevel or Quincke spinal needle  Needle Placement: Oblique pedical  Findings:   -Comments: There was excellent flow of contrast along the articular pillars without intravascular flow.  Procedure Details: The fluoroscope beam is vertically oriented in AP and then obliqued 15 to 20 degrees to the ipsilateral side of the desired nerve to achieve the "Scotty dog" appearance.  The skin over the target area of the junction of the superior articulating process and the transverse process (sacral ala if blocking the L5 dorsal rami) was locally anesthetized with a 1 ml volume of 1% Lidocaine without Epinephrine.  The spinal needle was inserted and advanced in a trajectory view down to the target.   After contact with periosteum and negative aspirate for blood and CSF, correct placement without intravascular or epidural spread was confirmed by injecting 0.5 ml. of Isovue-250.  A spot radiograph was obtained of this image.    Next, a  0.5 ml. volume of the injectate described above was injected. The needle was then redirected to the other facet joint nerves mentioned above if needed.  Prior to the procedure, the patient was given a Pain Diary which was completed for baseline measurements.  After the procedure, the patient rated their pain every 30 minutes and will continue rating at this frequency for a total of 5 hours.  The patient has been asked to complete the Diary and return to [place] by mail, fax or hand delivered as soon as possible.   Additional Comments:  The patient tolerated the procedure well Dressing: 2 x 2 sterile gauze and Band-Aid    Post-procedure details: Patient was observed during the procedure. Post-procedure instructions were reviewed.  Patient left the clinic in stable condition.

## 2021-01-24 DIAGNOSIS — N3010 Interstitial cystitis (chronic) without hematuria: Secondary | ICD-10-CM | POA: Diagnosis not present

## 2021-01-24 DIAGNOSIS — Z87440 Personal history of urinary (tract) infections: Secondary | ICD-10-CM | POA: Diagnosis not present

## 2021-01-24 IMAGING — CT CT CTA ABD/PEL W/CM AND/OR W/O CM
2 of 7 series · 17 of 36 positions shown · non-contrast
Comparison: February 04, 2008.

CLINICAL DATA: Aortic stenosis. Mitral valve regurgitation.

EXAM:
CT ANGIOGRAPHY CHEST, ABDOMEN AND PELVIS
TECHNIQUE: Non-contrast CT of the chest was initially obtained.

[Series 4: cta cap aneurysm 2.00 bv36 s3 axial arterial · axial · arterial · 0.66mm/px · z∈[+1071,+1645]mm · 16 of 327 slices shown]
[im 20/327  lung]
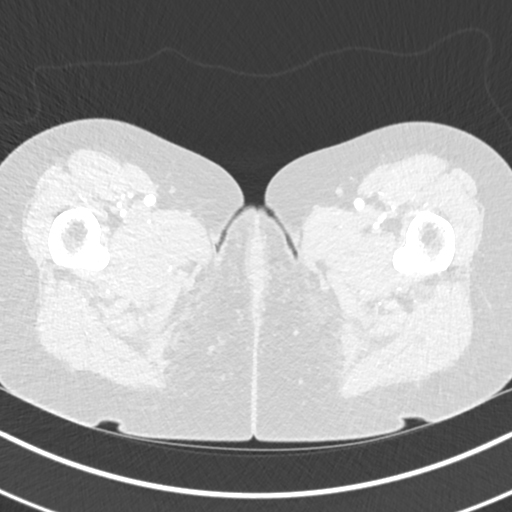
[im 39/327  mediastinal]
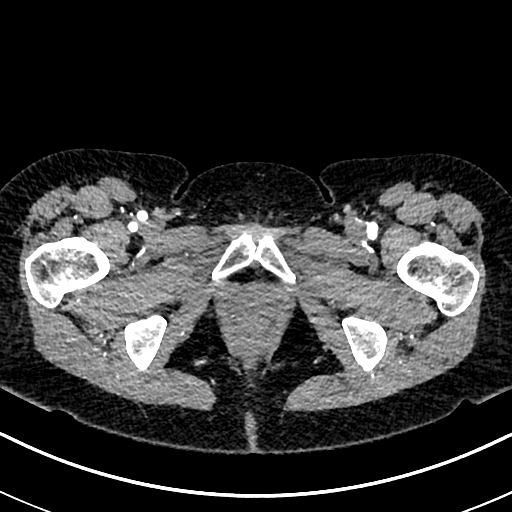
[im 58/327  lung]
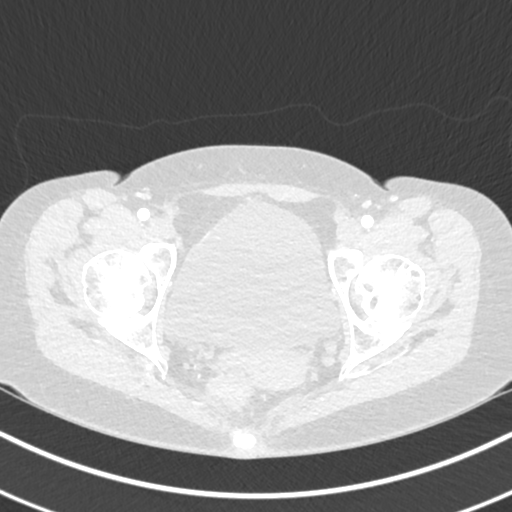
[im 77/327  mediastinal]
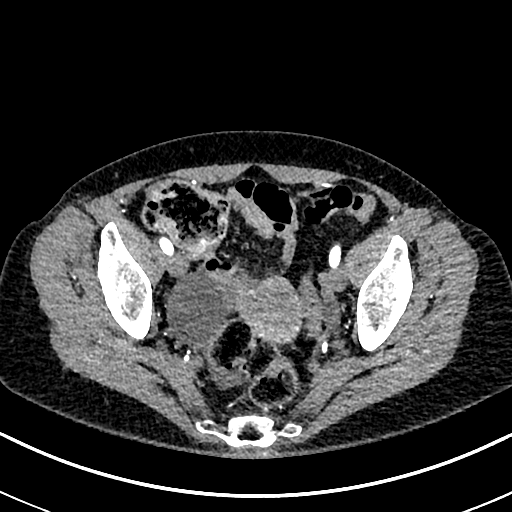
[im 96/327  lung]
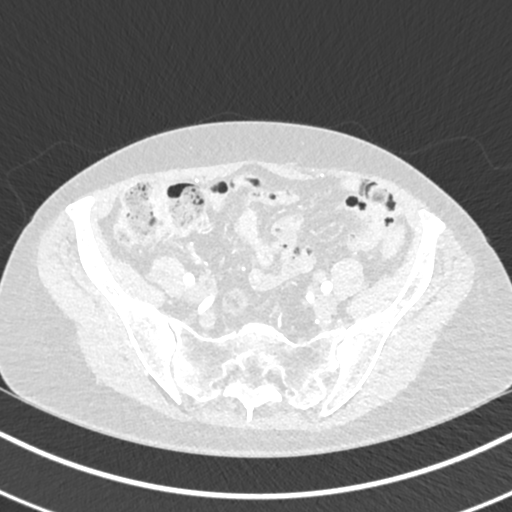
[im 116/327  mediastinal]
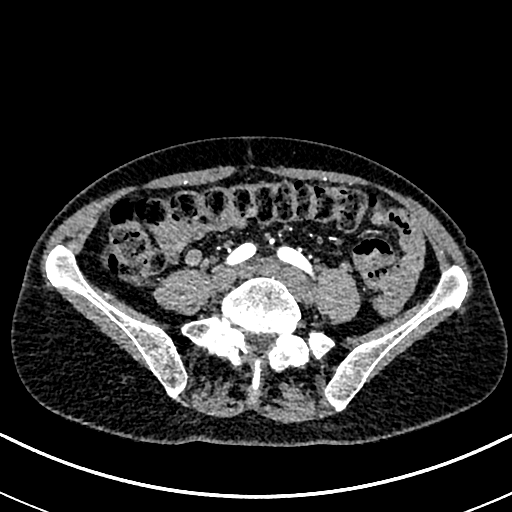
[im 135/327  lung]
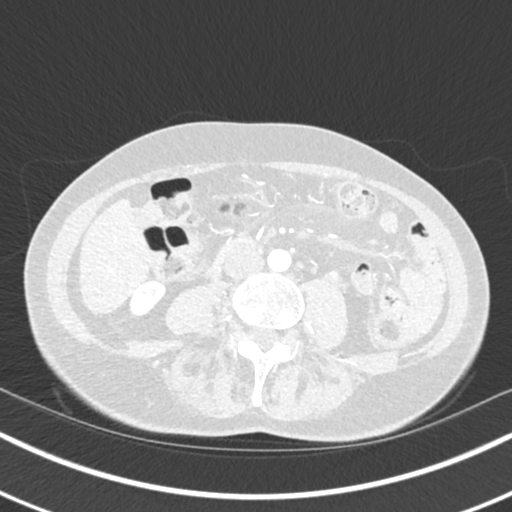
[im 154/327  mediastinal]
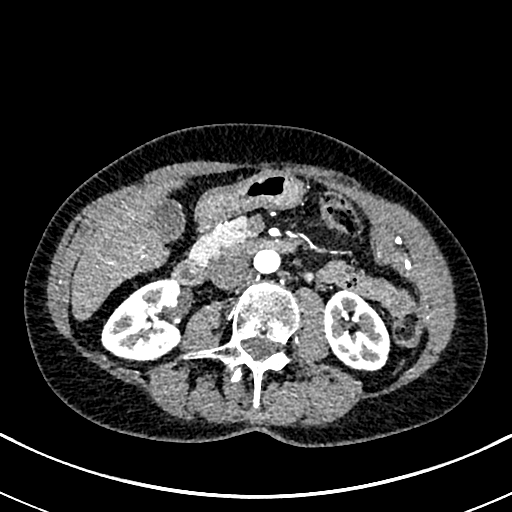
[im 173/327  lung]
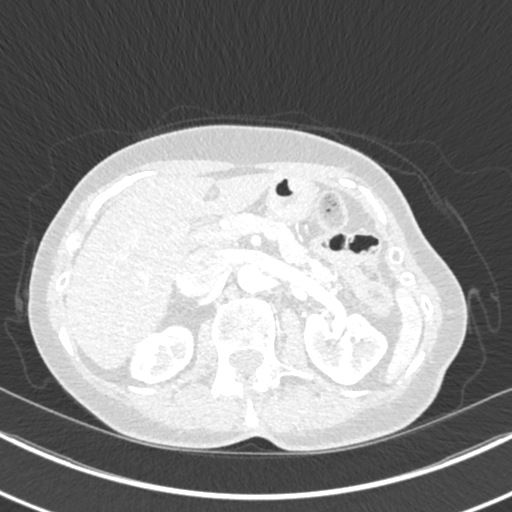
[im 192/327  mediastinal]
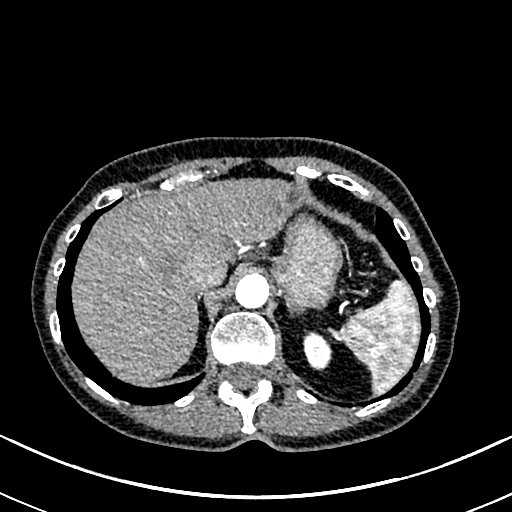
[im 211/327  lung]
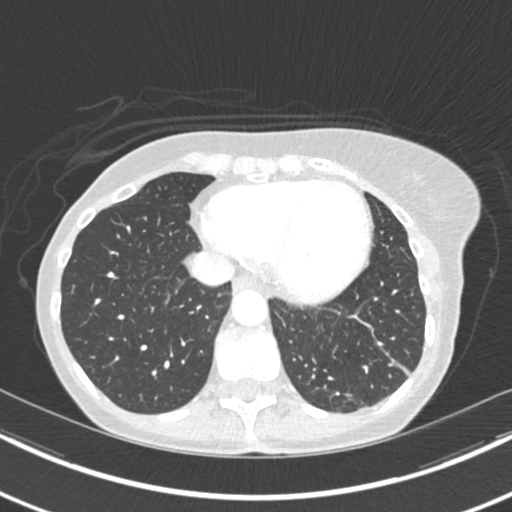
[im 231/327  mediastinal]
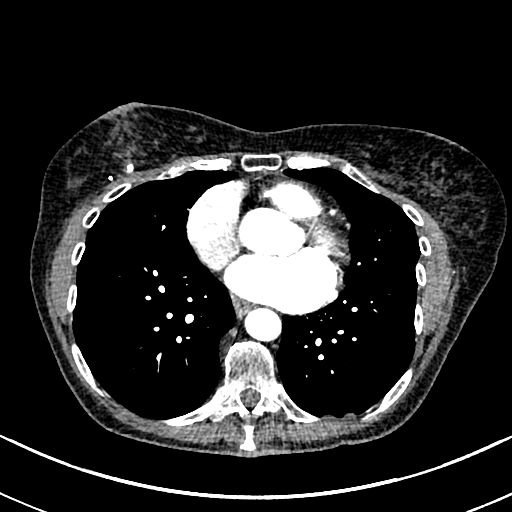
[im 250/327  lung]
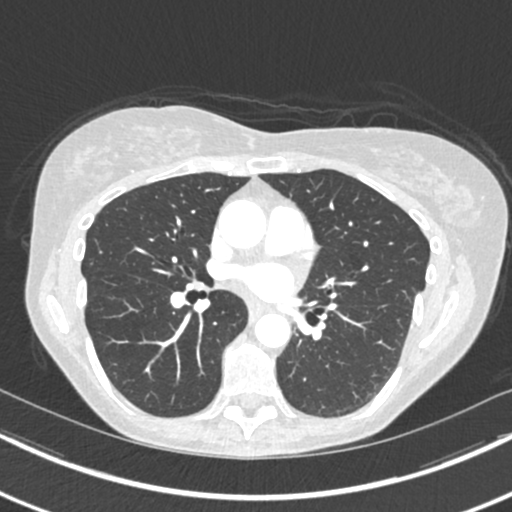
[im 269/327  mediastinal]
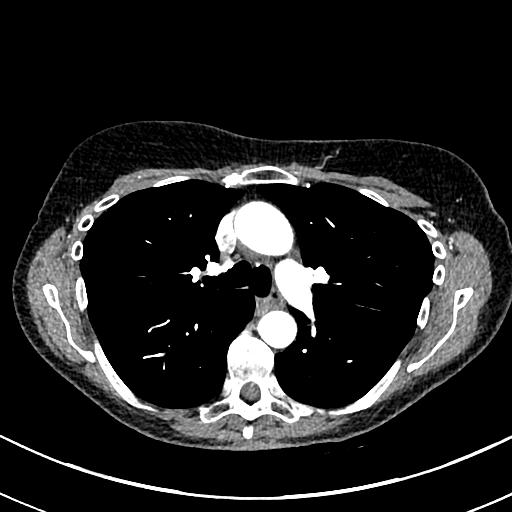
[im 288/327  lung]
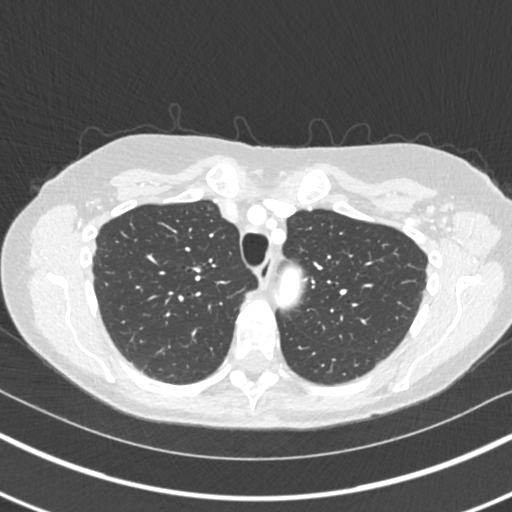
[im 307/327  mediastinal]
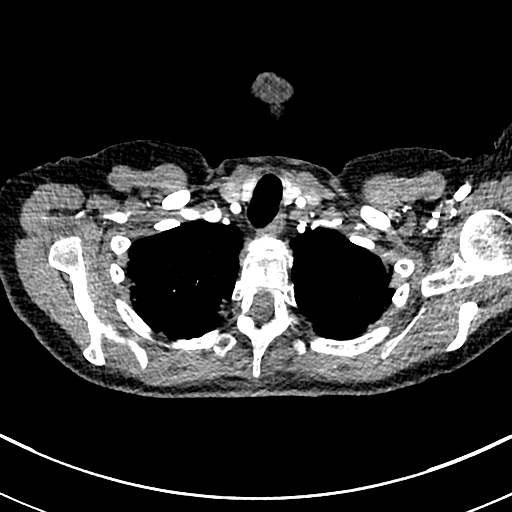

[Series 6: cta cap aneurysm 2.00 bv36 s3 cor st · coronal · 0.66mm/px · 1 of 123 slices shown]
[im 62/123  mediastinal]
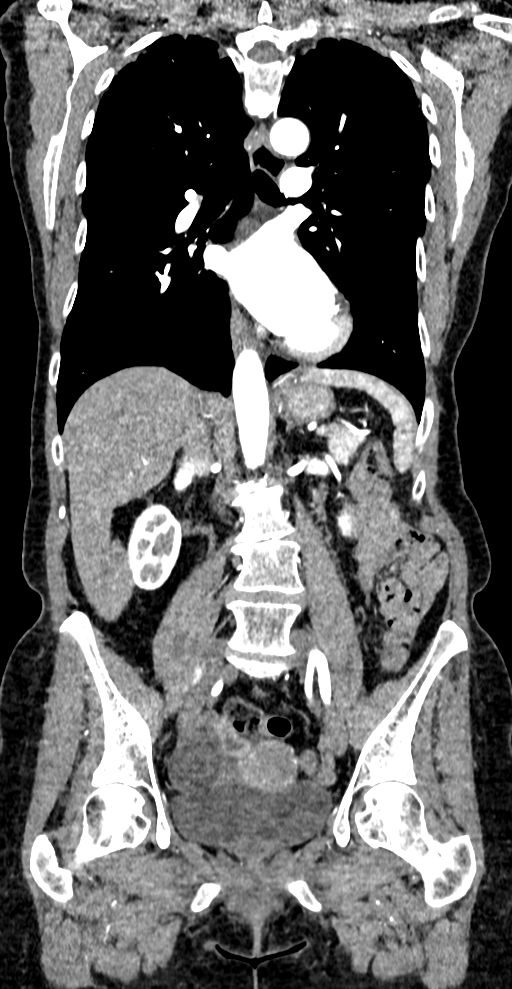

[17 of 36 positions shown; findings below may reference images not displayed]

Multidetector CT imaging through the chest, abdomen and pelvis was
performed using the standard protocol during bolus administration of
intravenous contrast. Multiplanar reconstructed images and MIPs were
obtained and reviewed to evaluate the vascular anatomy.

CONTRAST:  75mL U96FOO-QER IOPAMIDOL (U96FOO-QER) INJECTION 76%
FINDINGS: CTA CHEST FINDINGS

Cardiovascular: Preferential opacification of the thoracic aorta. No
evidence of thoracic aortic aneurysm or dissection. Normal heart
size. No pericardial effusion.

Mediastinum/Nodes: No enlarged mediastinal, hilar, or axillary lymph
nodes. Thyroid gland, trachea, and esophagus demonstrate no
significant findings.

Lungs/Pleura: No pneumothorax or pleural effusion is noted. Mild
biapical scarring is noted.

Musculoskeletal: No chest wall abnormality. No acute or significant
osseous findings.

Review of the MIP images confirms the above findings.

CTA ABDOMEN AND PELVIS FINDINGS

VASCULAR

Aorta: Normal caliber aorta without aneurysm, dissection, vasculitis
or significant stenosis.

Celiac: Patent without evidence of aneurysm, dissection, vasculitis
or significant stenosis.

SMA: Patent without evidence of aneurysm, dissection, vasculitis or
significant stenosis.

Renals: Both renal arteries are patent without evidence of aneurysm,
dissection, vasculitis, fibromuscular dysplasia or significant
stenosis.

IMA: Patent without evidence of aneurysm, dissection, vasculitis or
significant stenosis.

Inflow: Patent without evidence of aneurysm, dissection, vasculitis
or significant stenosis.

Veins: No obvious venous abnormality within the limitations of this
arterial phase study.

Review of the MIP images confirms the above findings.

NON-VASCULAR

Hepatobiliary: No gallstones or biliary dilatation is noted. Stable
hepatic cysts are noted.

Pancreas: Unremarkable. No pancreatic ductal dilatation or
surrounding inflammatory changes.

Spleen: Normal in size without focal abnormality.

Adrenals/Urinary Tract: Adrenal glands are unremarkable. Kidneys are
normal, without renal calculi, focal lesion, or hydronephrosis.
Bladder is unremarkable.

Stomach/Bowel: Stomach is within normal limits. Appendix appears
normal. No evidence of bowel wall thickening, distention, or
inflammatory changes.

Lymphatic: No significant adenopathy is noted.

Reproductive: Uterus and left adnexal regions are unremarkable. 5 cm
right ovarian cystic abnormality is noted.

Other: No abdominal wall hernia or abnormality. No abdominopelvic
ascites.

Musculoskeletal: No acute or significant osseous findings.

Review of the MIP images confirms the above findings.
IMPRESSION: 1. No evidence of thoracic or abdominal aortic aneurysm or
dissection.
2. Stable hepatic cysts.
3. 5 cm right ovarian cystic abnormality is noted. Pelvic ultrasound
is recommended for further evaluation.

## 2021-02-11 ENCOUNTER — Other Ambulatory Visit: Payer: Self-pay

## 2021-02-11 ENCOUNTER — Encounter: Payer: Self-pay | Admitting: Thoracic Surgery (Cardiothoracic Vascular Surgery)

## 2021-02-11 ENCOUNTER — Ambulatory Visit (INDEPENDENT_AMBULATORY_CARE_PROVIDER_SITE_OTHER): Payer: PPO | Admitting: Thoracic Surgery (Cardiothoracic Vascular Surgery)

## 2021-02-11 VITALS — BP 114/74 | HR 74 | Resp 20 | Ht 67.0 in | Wt 135.0 lb

## 2021-02-11 DIAGNOSIS — Z9889 Other specified postprocedural states: Secondary | ICD-10-CM

## 2021-02-11 NOTE — Patient Instructions (Signed)
Continue all previous medications without any changes at this time  

## 2021-02-11 NOTE — Progress Notes (Signed)
[address]       ,[place] [address]             [phone]     CARDIOTHORACIC SURGERY OFFICE NOTE  Primary Cardiologist is Ena Dawley, MD (Inactive) PCP is Ginger Organ., MD   HPI:  Kellie Humphrey is a 75 year old female with mitral valve prolapse and long history of palpitations related to PVCs and PACs who returns the office today for routine follow-up status post minimally invasive mitral valve repair on Feb 10, 2020 for severe symptomatic primary mitral regurgitation.  Routine follow-up echocardiogram performed March 26, 2020 revealed low normal left ventricular systolic function with ejection fraction estimated 55 to 60%.  Mitral valve repair appeared intact with "trivial" residual mitral regurgitation.  She was last seen in follow-up in our office on April 09, 2020 and more recently she was seen in follow-up by Dr. [name] November 06, 2020 at which time she was doing very well.  She returns her office today and reports that she continues to do well.  She states that she is fairly active physically.  She only gets short of breath with more strenuous physical exertion and this does not limit her activity to any significant degree.  She otherwise has not had any other significant problems although she does still have some numbness along the right lateral chest wall related to her surgical incision.  She denies any associated pain.   Current Outpatient Medications  Medication Sig Dispense Refill  . acetaminophen (TYLENOL) 500 MG tablet Take 500-1,000 mg by mouth See admin instructions. Take 500 mg at night, may take a second 500 -1000 mg dose as needed for pain    . aspirin EC 81 MG tablet Take 1 tablet (81 mg total) by mouth daily. Swallow whole. 90 tablet 3  . Biotin 5000 MCG CAPS Take 5,000 mcg by mouth every evening.    . cephALEXin (KEFLEX) 250 MG capsule Take 250 mg by mouth daily.    . Cholecalciferol (VITAMIN D) 2000 units tablet Take 4,000 Units by mouth  daily.    . Cyanocobalamin (B-12) 5000 MCG CAPS Take 5,000 mcg by mouth once a week. Mondays & Fridays.    . flecainide (TAMBOCOR) 50 MG tablet Take 1 tablet (50 mg total) by mouth 2 (two) times daily. 180 tablet 3  . gabapentin (NEURONTIN) 600 MG tablet Take 600 mg by mouth 2 (two) times daily.    [name] ibuprofen (ADVIL) 200 MG tablet Take 400 mg by mouth as needed.    [name] levothyroxine (SYNTHROID) 50 MCG tablet Take 1 tablet (50 mcg total) by mouth daily before breakfast.    . melatonin 5 MG TABS Take 5 mg by mouth.    . metoprolol tartrate (LOPRESSOR) 25 MG tablet Take 0.5 tablets (12.5 mg total) by mouth 2 (two) times daily. 90 tablet 3  . Omega-3 Fatty Acids (FISH OIL) 1000 MG CAPS Take by mouth in the morning and at bedtime.    . primidone (MYSOLINE) 50 MG tablet Take 50 mg by mouth at bedtime.    . SYSTANE ULTRA 0.4-0.3 % SOLN Place 1 drop into the left eye every 4 (four) hours as needed (dry/irritated eyes. (4-6 times daily)).     . TURMERIC PO Take by mouth.    . nitrofurantoin (MACRODANTIN) 50 MG capsule Take 1 capsule (50 mg total) by mouth daily. 30 capsule 6   Current Facility-Administered Medications  Medication Dose Route Frequency Provider Last Rate  Last Admin  . bupivacaine (MARCAINE) 0.5 % (with pres) injection 3 mL  3 mL Other Once Magnus Sinning, MD          Physical Exam:   BP 114/74 (BP Location: Right Arm, Kellie Humphrey Position: Sitting)   Pulse 74   Resp 20   Ht 5\' 7"  (1.702 m)   Wt 135 lb (61.2 kg)   SpO2 99% Comment: RA  BMI 21.14 kg/m   General:  Well-appearing  Chest:   Clear to auscultation  CV:   Regular rate and rhythm without murmur  Incisions:  Completely healed  Abdomen:  Soft nontender  Extremities:  Warm and well-perfused  Diagnostic Tests:  n/a   Impression:  Kellie Humphrey is doing well approximately 1 year status post mitral valve repair  Plan:  We have not recommended any change the Kellie Humphrey's current medications.  The Kellie Humphrey will continue to  follow up intermittently with her new cardiologist.  She will call this office in the future only should specific problems or questions arise.  All questions have been answered.  I spent in excess of 15 minutes during the conduct of this office consultation and >50% of this time involved direct face-to-face encounter with the Kellie Humphrey for counseling and/or coordination of their care.    Valentina Gu. Roxy Manns, MD 02/11/2021 3:28 PM

## 2021-02-28 IMAGING — DX DG CHEST 1V PORT
1 series · 1 of 1 positions shown · non-contrast
Comparison: February 12, 2020 chest radiograph and chest CT January 09, 2020

CLINICAL DATA: Pneumothorax

EXAM:
PORTABLE CHEST 1 VIEW

[chest ap]
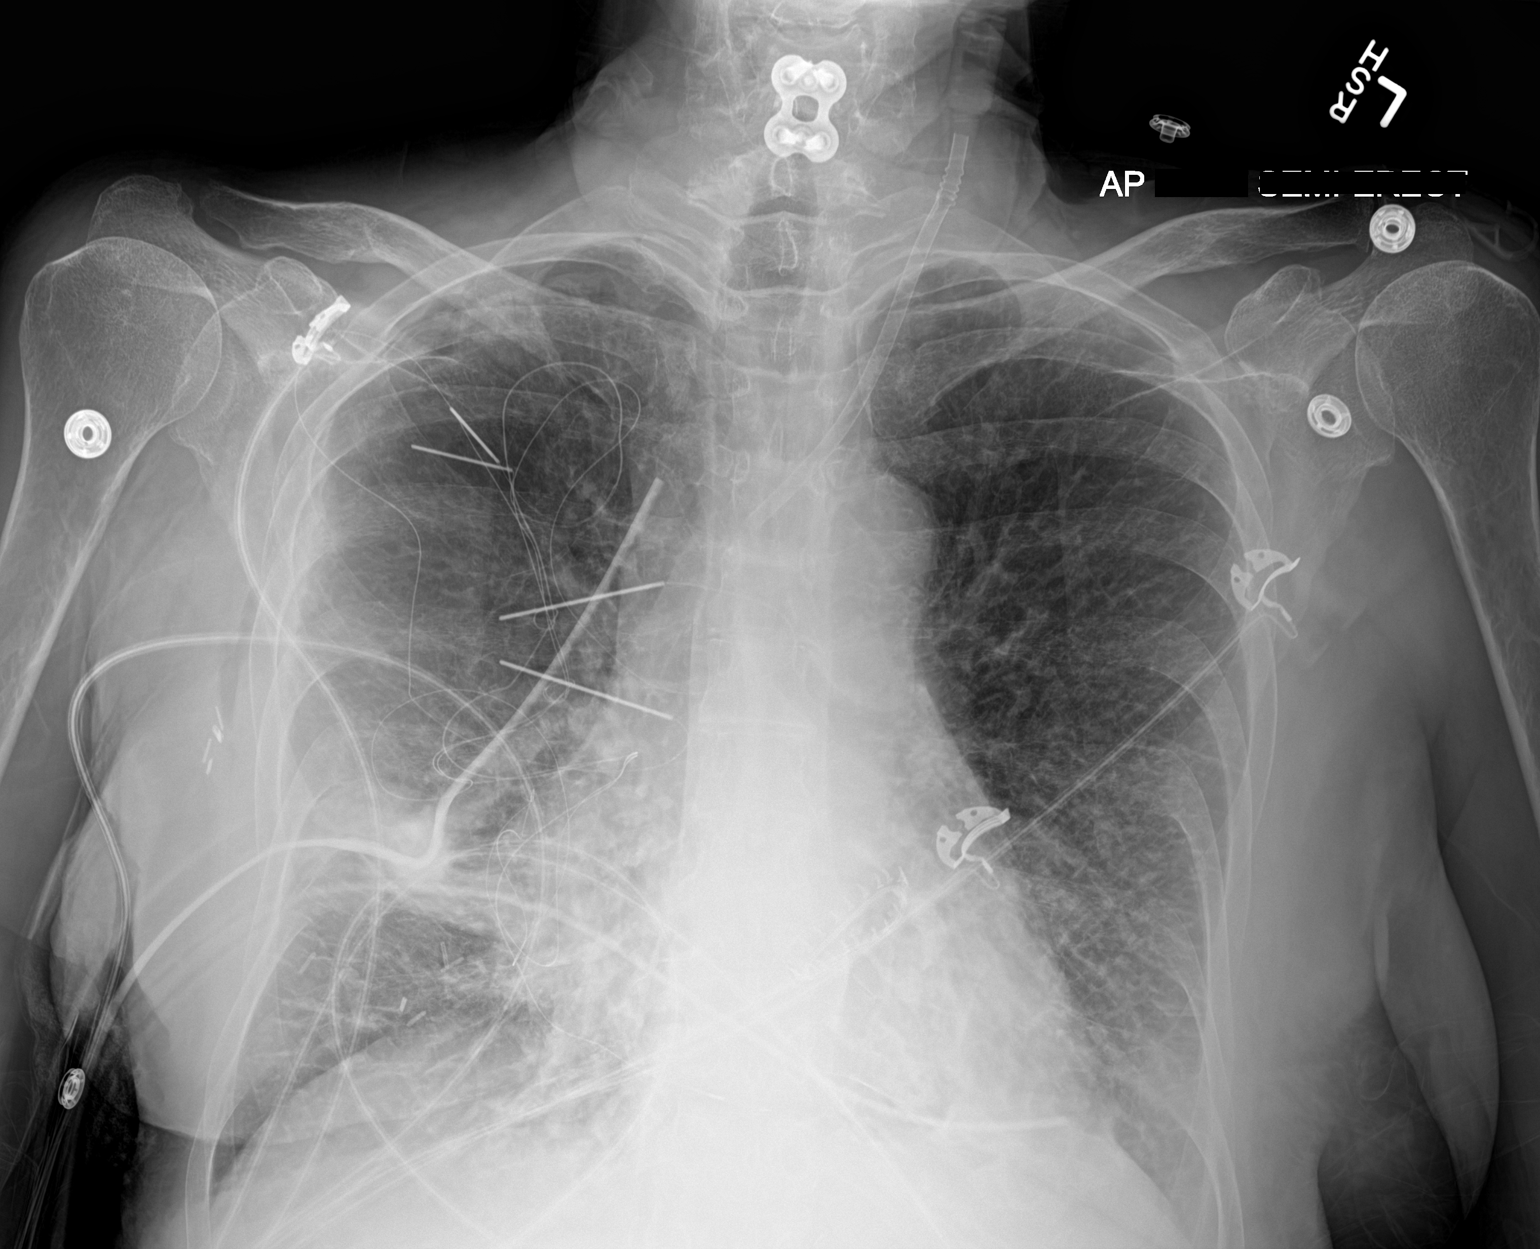

[1 of 1 positions shown; findings below may reference images not displayed]

FINDINGS: Chest tube remains on the right, unchanged. Central catheter tip is
in the left innominate vein. Temporary pacemaker wires are attached
to the right heart. Rather minimal right apical pneumothorax
persists.

There is airspace consolidation in the right lower lung region with
atelectasis, stable. There is underlying interstitial edema. The
heart is borderline enlarged with mild pulmonary venous
hypertension. No evident adenopathy. There is postoperative change
in the lower cervical region.
IMPRESSION: Persistent rather minimal pneumothorax on the right without tension
component. Stable airspace opacity right lower lung region. There is
mild cardiomegaly with pulmonary vascular congestion and a degree of
interstitial edema. No new opacity evident.

## 2021-03-02 IMAGING — DX DG CHEST 2V
2 series · 2 of 2 positions shown · non-contrast
Comparison: 02/14/2020

CLINICAL DATA: Mitral valve repair 3 days ago with upper
right-sided chest pain.

EXAM:
CHEST - 2 VIEW

[chest lat]
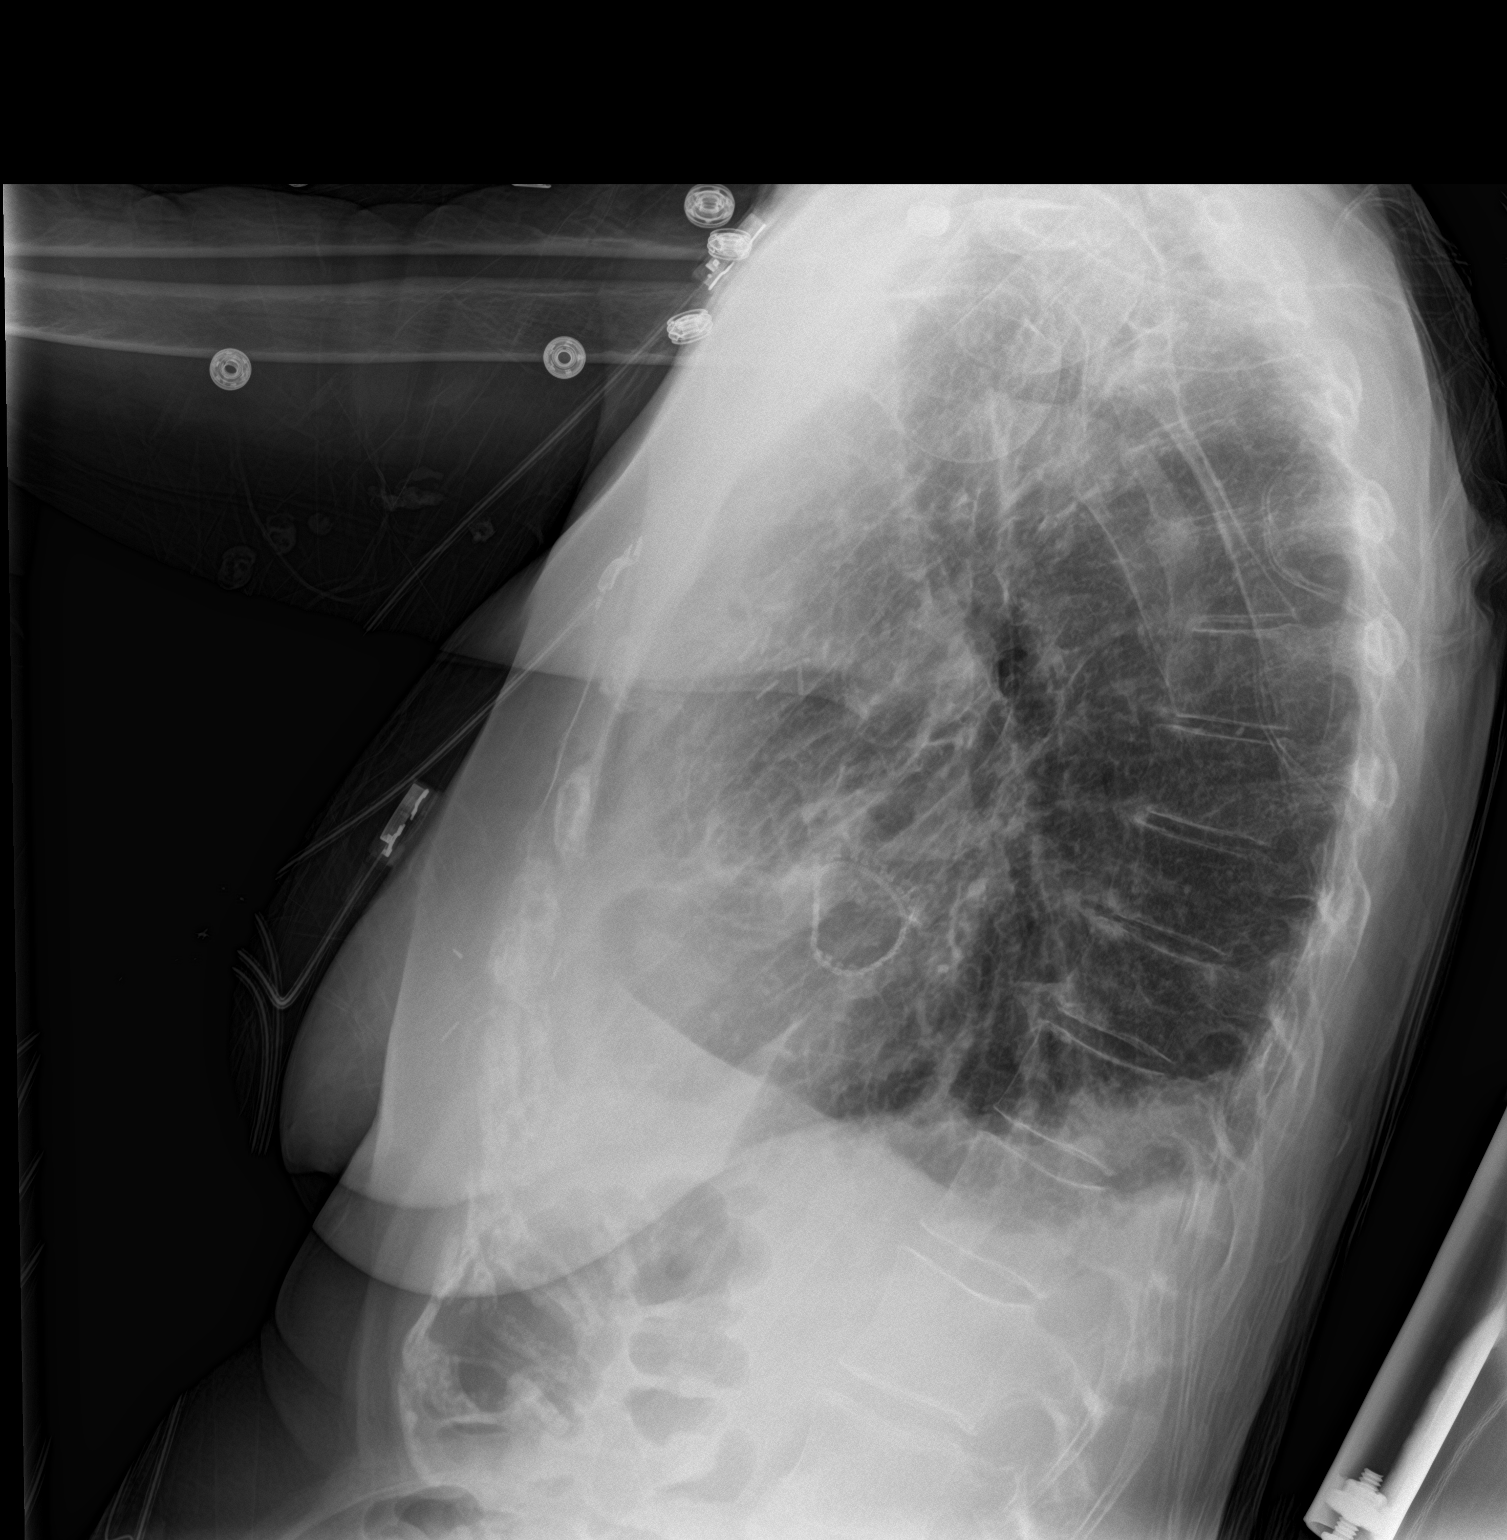

[chest ap]
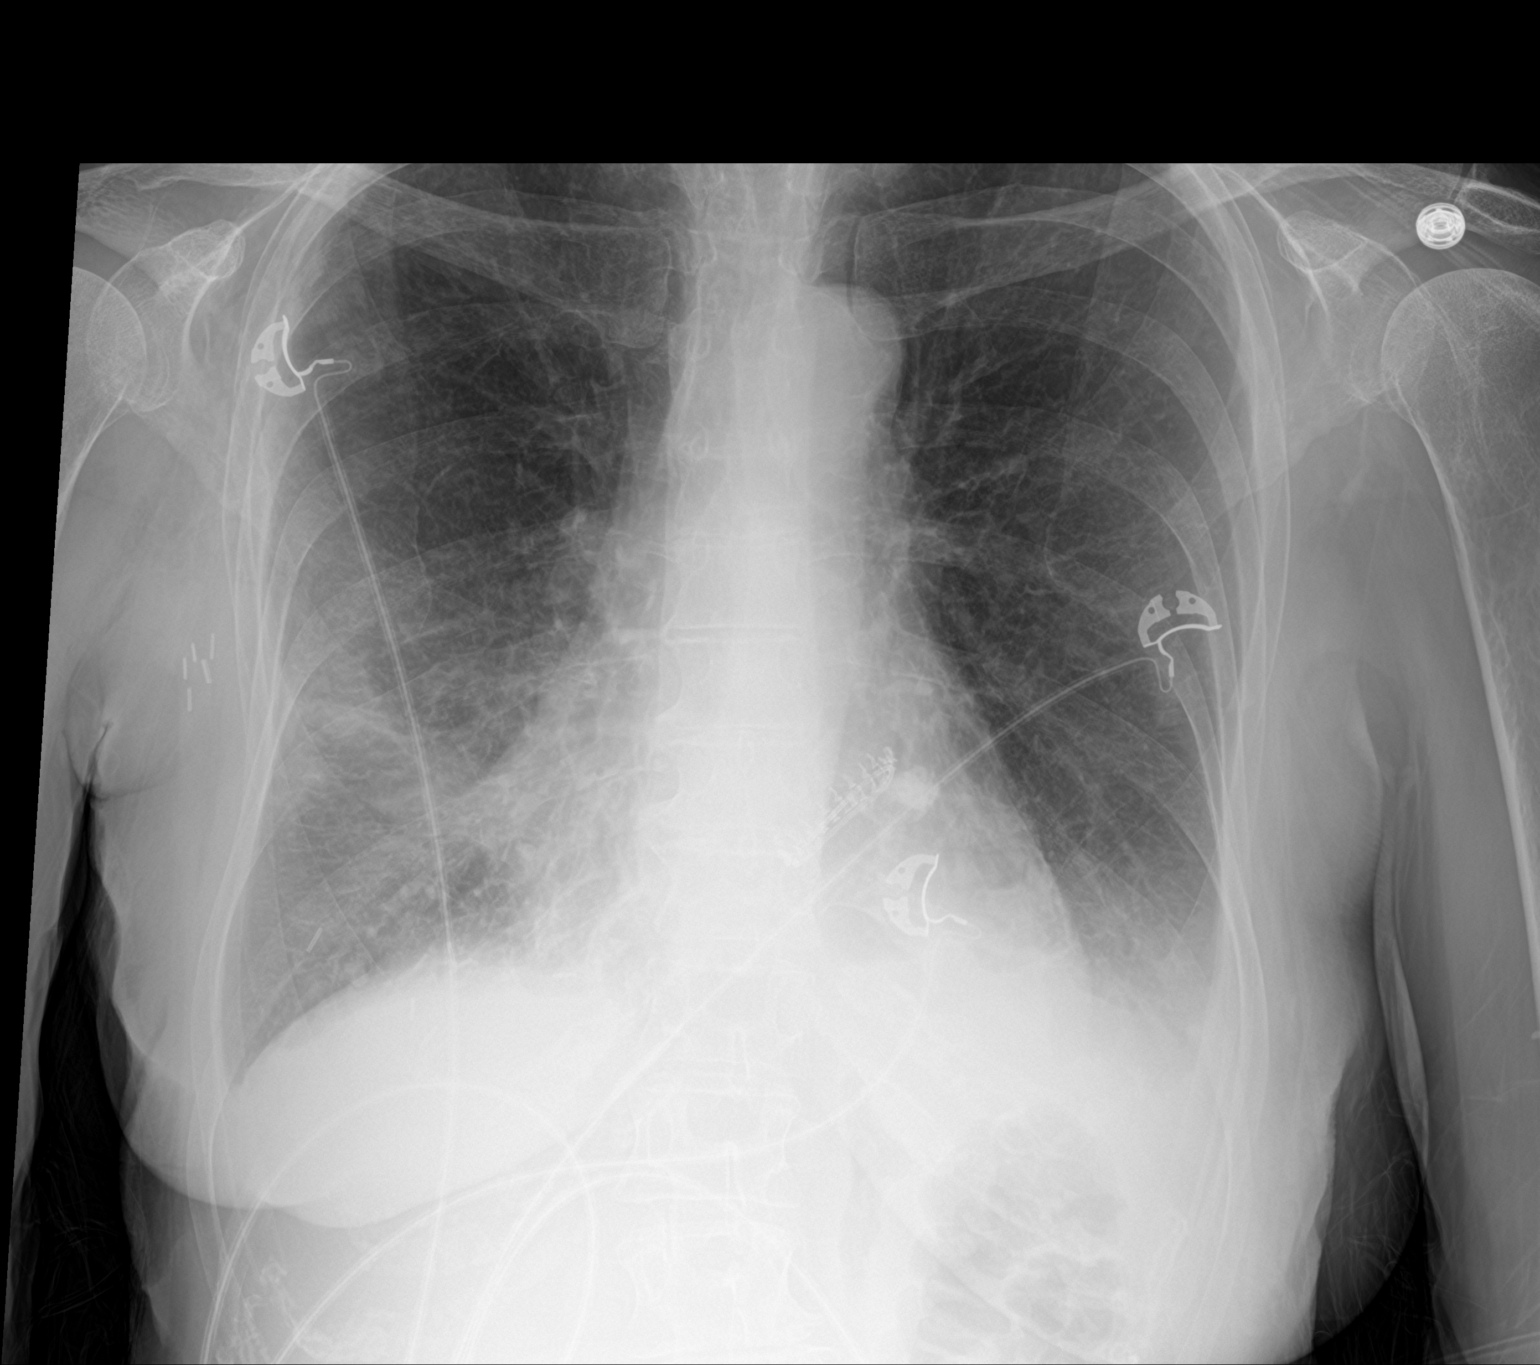

[2 of 2 positions shown; findings below may reference images not displayed]

FINDINGS: Lungs are adequately inflated demonstrate mild left base
opacification likely small effusion with atelectasis. Minimal right
midlung density likely atelectasis. Tiny residual right apical
pneumothorax unchanged. Cardiomediastinal silhouette and remainder
of the exam is unchanged.
IMPRESSION: 1. Mild left base opacification without significant change
compatible with small effusion/atelectasis. Minimal right midlung
atelectasis.

2.  Tiny right apical pneumothorax unchanged.

## 2021-03-07 DIAGNOSIS — Z85820 Personal history of malignant melanoma of skin: Secondary | ICD-10-CM | POA: Diagnosis not present

## 2021-03-07 DIAGNOSIS — L720 Epidermal cyst: Secondary | ICD-10-CM | POA: Diagnosis not present

## 2021-03-07 DIAGNOSIS — Z85828 Personal history of other malignant neoplasm of skin: Secondary | ICD-10-CM | POA: Diagnosis not present

## 2021-03-07 DIAGNOSIS — L910 Hypertrophic scar: Secondary | ICD-10-CM | POA: Diagnosis not present

## 2021-03-07 DIAGNOSIS — I788 Other diseases of capillaries: Secondary | ICD-10-CM | POA: Diagnosis not present

## 2021-03-15 DIAGNOSIS — B349 Viral infection, unspecified: Secondary | ICD-10-CM | POA: Diagnosis not present

## 2021-03-15 DIAGNOSIS — Z1152 Encounter for screening for COVID-19: Secondary | ICD-10-CM | POA: Diagnosis not present

## 2021-03-15 DIAGNOSIS — R110 Nausea: Secondary | ICD-10-CM | POA: Diagnosis not present

## 2021-03-15 DIAGNOSIS — J029 Acute pharyngitis, unspecified: Secondary | ICD-10-CM | POA: Diagnosis not present

## 2021-03-15 DIAGNOSIS — Z20828 Contact with and (suspected) exposure to other viral communicable diseases: Secondary | ICD-10-CM | POA: Diagnosis not present

## 2021-03-28 ENCOUNTER — Telehealth: Payer: Self-pay | Admitting: Internal Medicine

## 2021-03-28 NOTE — Telephone Encounter (Signed)
I left a message for the patient that she will need an office visit.  I asked that she call [phone] and make an appointment with Dr. Carlean Purl or APP.  Patient seen only for screening procedures.

## 2021-03-28 NOTE — Telephone Encounter (Signed)
Patient called states she has been having nausea for about 5-6 weeks now. Went to her PCP and they provided her with Zofran but she said she has a heart condition and is not able to take that medication everyday. Seeking advise.

## 2021-03-29 DIAGNOSIS — R110 Nausea: Secondary | ICD-10-CM | POA: Diagnosis not present

## 2021-03-29 DIAGNOSIS — R1084 Generalized abdominal pain: Secondary | ICD-10-CM | POA: Diagnosis not present

## 2021-04-15 ENCOUNTER — Other Ambulatory Visit: Payer: Self-pay | Admitting: *Deleted

## 2021-04-15 MED ORDER — FLECAINIDE ACETATE 50 MG PO TABS: 50.0000 mg | ORAL_TABLET | Freq: Two times a day (BID) | ORAL | 1 refills | Status: DC

## 2021-04-29 ENCOUNTER — Ambulatory Visit: Payer: PPO | Admitting: Physician Assistant

## 2021-04-29 ENCOUNTER — Encounter: Payer: Self-pay | Admitting: Physician Assistant

## 2021-04-29 VITALS — BP 118/60 | HR 66 | Ht 67.0 in | Wt 130.0 lb

## 2021-04-29 DIAGNOSIS — R634 Abnormal weight loss: Secondary | ICD-10-CM | POA: Diagnosis not present

## 2021-04-29 DIAGNOSIS — R110 Nausea: Secondary | ICD-10-CM

## 2021-04-29 DIAGNOSIS — R1013 Epigastric pain: Secondary | ICD-10-CM | POA: Diagnosis not present

## 2021-04-29 NOTE — Patient Instructions (Signed)
If you are age 73 or older, your body mass index should be between 23-30. Your Body mass index is 20.36 kg/m. If this is out of the aforementioned range listed, please consider follow up with your Primary Care Provider. __________________________________________________________  The [facility] providers would like to encourage you to use [facility] to communicate with providers for non-urgent requests or questions.  Due to long hold times on the telephone, sending your provider a message by [facility] may be a faster and more efficient way to get a response.  Please allow 48 business hours for a response.  Please remember that this is for non-urgent requests.   You have been scheduled for an endoscopy. Please follow written instructions given to you at your visit today. If you use inhalers (even only as needed), please bring them with you on the day of your procedure.  Continue Pantoprazole 40 mg 1 tablet prior to breakfast  Thank you for entrusting me with your care and choosing [facility].  Amy Esterwood, PA-C

## 2021-04-29 NOTE — Progress Notes (Signed)
Subjective:    Patient ID: Kellie Humphrey, female    DOB: [birth year], 75 y.o.   MRN: [id number]  HPI Kellie Humphrey is a pleasant 75 year old white female, established with Dr. Carlean Humphrey.  She was last seen in January 2019 when she underwent colonoscopy which was normal. She comes in today with complaints of persistent nausea over the past 2 months.  She says this is been fairly constant, she will get a little bit of relief immediately after eating, then symptoms return.  She has not had any associated vomiting, no heartburn or indigestion.  Her weight is down about 5 pounds since onset of symptoms.  Bowel movements have been normal, no melena or hematochezia. She was started on Protonix 40 mg daily about a month ago but has not noticed any change in symptoms.  She had been given a prescription for Zofran but determined that this interacts with flecainide and therefore has not been using. She did have labs done through Dr. Jacquelynn Humphrey office after onset of symptoms and patient reports these were unremarkable, will obtain copies.  No recent abdominal imaging. She is not on any regular aspirin or NSAIDs.  She had not started any new medications.  She had switched her prophylactic antibiotic for recurrent UTIs to Keflex but says she stopped the Keflex after the symptoms started and still has not noticed any improvement in symptoms. Patient has history of atrial fibrillation on flecainide, severe mitral regurgitation status post mitral valve repair May 2021, history of breast cancer diagnosed 2013,, hypothyroidism.  Review of Systems. Pertinent positive and negative review of systems were noted in the above HPI section.  All other review of systems was otherwise negative.   Outpatient Encounter Medications as of 04/29/2021  Medication Sig   acetaminophen (TYLENOL) 500 MG tablet Take 500-1,000 mg by mouth See admin instructions. Take 500 mg at night, may take a second 500 -1000 mg dose as needed for pain    aspirin EC 81 MG tablet Take 1 tablet (81 mg total) by mouth daily. Swallow whole.   Biotin 5000 MCG CAPS Take 5,000 mcg by mouth every evening.   Cholecalciferol (VITAMIN D) 2000 units tablet Take 4,000 Units by mouth daily.   Cyanocobalamin (B-12) 5000 MCG CAPS Take 5,000 mcg by mouth once a week. Mondays & Fridays.   flecainide (TAMBOCOR) 50 MG tablet Take 1 tablet (50 mg total) by mouth 2 (two) times daily.   gabapentin (NEURONTIN) 600 MG tablet Take 600 mg by mouth 2 (two) times daily.   ibuprofen (ADVIL) 200 MG tablet Take 400 mg by mouth as needed.   levothyroxine (SYNTHROID) 50 MCG tablet Take 1 tablet (50 mcg total) by mouth daily before breakfast.   melatonin 5 MG TABS Take 5 mg by mouth.   metoprolol tartrate (LOPRESSOR) 25 MG tablet Take 0.5 tablets (12.5 mg total) by mouth 2 (two) times daily.   Omega-3 Fatty Acids (FISH OIL) 1000 MG CAPS Take by mouth in the morning and at bedtime.   primidone (MYSOLINE) 50 MG tablet Take 50 mg by mouth at bedtime.   SYSTANE ULTRA 0.4-0.3 % SOLN Place 1 drop into the left eye every 4 (four) hours as needed (dry/irritated eyes. (4-6 times daily)).    cephALEXin (KEFLEX) 250 MG capsule Take 250 mg by mouth daily. (Patient not taking: Reported on 04/29/2021)   pantoprazole (PROTONIX) 40 MG tablet Take 40 mg by mouth daily.   [DISCONTINUED] nitrofurantoin (MACRODANTIN) 50 MG capsule Take 1 capsule (50 mg total)  by mouth daily.   [DISCONTINUED] TURMERIC PO Take by mouth.   Facility-Administered Encounter Medications as of 04/29/2021  Medication   bupivacaine (MARCAINE) 0.5 % (with pres) injection 3 mL   Allergies  Allergen Reactions   Codeine Nausea And Vomiting   Patient Active Problem List   Diagnosis Date Noted   Encounter for removal of sutures 02/23/2020   Encounter for therapeutic drug monitoring 02/20/2020   Atrial fibrillation ([place]) 02/20/2020   S/P minimally-invasive mitral valve repair 02/10/2020   Nonrheumatic mitral valve  regurgitation    Varicose veins of right lower extremity with complications [id number]   Paresthesias 07/29/2014   Tremor 12/08/2013   Personal history of breast cancer 09/06/2013   Dense breast 09/06/2013   Malignant neoplasm of lower-outer quadrant of right breast of female, estrogen receptor positive ([place]) 02/11/2012   Severe mitral valve regurgitation 05/08/2009   PREMATURE ATRIAL CONTRACTIONS 05/08/2009   Social History   Socioeconomic History   Marital status: Married    Spouse name: Kellie Humphrey   Number of children: 2   Years of education: Bachelor   Highest education level: Not on file  Occupational History   Not on file  Tobacco Use   Smoking status: Never   Smokeless tobacco: Never  Vaping Use   Vaping Use: Never used  Substance and Sexual Activity   Alcohol use: Yes    Comment: Socially   Drug use: No   Sexual activity: Yes    Birth control/protection: Post-menopausal  Other Topics Concern   Not on file  Social History Narrative   Patient is married to Kellie Humphrey, has 2 children   Patient is right handed   Education level is Bachelor   Caffeine consumption is none other than in chocolate occasionally       Social Determinants of Radio broadcast assistant Strain: Not on file  Food Insecurity: Not on file  Transportation Needs: Not on file  Physical Activity: Not on file  Stress: Not on file  Social Connections: Not on file  Intimate Partner Violence: Not on file    Kellie Humphrey's family history includes Alzheimer's disease in her mother; Coronary artery disease in her mother; Fibromyalgia in her sister; Heart disease in her maternal grandmother; Hypertension in her mother; Neuropathy in her father; Osteoarthritis in her father; Osteoporosis in her mother; Other in her father; Tremor in her father.      Objective:    Vitals:   04/29/21 1026  BP: 118/60  Pulse: 66  SpO2: 99%    Physical Exam.Well-developed well-nourished older white female in no acute  distress.  Height, Weight, 130 BMI 20.3  HEENT; nontraumatic normocephalic, EOMI, PE R LA, sclera anicteric. Oropharynx; not examined today Neck; supple, no JVD Cardiovascular; regular rate and rhythm with S1-S2, no murmur rub or gallop Pulmonary; Clear bilaterally Abdomen; soft, there is very mild tenderness in the epigastrium, no guarding or rebound nondistended, no palpable mass or hepatosplenomegaly, bowel sounds are active Rectal; not done today Skin; benign exam, no jaundice rash or appreciable lesions Extremities; no clubbing cyanosis or edema skin warm and dry Neuro/Psych; alert and oriented x4, grossly nonfocal mood and affect appropriate        Assessment & Plan:   #58 75 year old white female with 70-monthhistory of persistent fairly constant nausea, sometimes briefly improved with p.o. intake, associated 5 pound weight loss. No improvement in symptoms after 1 month of empiric Protonix 40 mg.  Etiology of symptoms is not clear, rule out peptic ulcer  disease, gastropathy, neoplasm, gallbladder disease.  #2 colon cancer surveillance-up-to-date with normal colonoscopy January 2019 #3 Personal history of breast cancer 2013 #4.  Atrial fibrillation on flecainide #5.  History of severe mitral regurgitation status post mitral valve repair May 2021  Plan for now we will continue Protonix 40 mg p.o. every morning until further diagnostic work-up completed. Obtain copies of recent lab work from [facility] Patient will be scheduled for upper endoscopy with Dr. Carlean Humphrey later this week.  Procedure was discussed in detail with the patient including indications risk and benefits and she is agreeable to proceed. If EGD is unremarkable will need further work-up with CT of the abdomen and pelvis with contrast.  Richell Corker Genia Harold PA-C 04/29/2021   Cc: Ginger Organ., MD

## 2021-04-30 ENCOUNTER — Other Ambulatory Visit: Payer: Self-pay

## 2021-04-30 ENCOUNTER — Ambulatory Visit (AMBULATORY_SURGERY_CENTER): Payer: PPO | Admitting: Internal Medicine

## 2021-04-30 ENCOUNTER — Encounter: Payer: Self-pay | Admitting: Internal Medicine

## 2021-04-30 VITALS — BP 110/59 | HR 55 | Temp 97.0°F | Resp 9 | Ht 67.0 in | Wt 130.0 lb

## 2021-04-30 DIAGNOSIS — K219 Gastro-esophageal reflux disease without esophagitis: Secondary | ICD-10-CM | POA: Diagnosis not present

## 2021-04-30 DIAGNOSIS — G2581 Restless legs syndrome: Secondary | ICD-10-CM | POA: Diagnosis not present

## 2021-04-30 DIAGNOSIS — K3189 Other diseases of stomach and duodenum: Secondary | ICD-10-CM | POA: Diagnosis not present

## 2021-04-30 DIAGNOSIS — K634 Enteroptosis: Secondary | ICD-10-CM

## 2021-04-30 DIAGNOSIS — K319 Disease of stomach and duodenum, unspecified: Secondary | ICD-10-CM

## 2021-04-30 DIAGNOSIS — I10 Essential (primary) hypertension: Secondary | ICD-10-CM | POA: Diagnosis not present

## 2021-04-30 DIAGNOSIS — R110 Nausea: Secondary | ICD-10-CM

## 2021-04-30 DIAGNOSIS — K317 Polyp of stomach and duodenum: Secondary | ICD-10-CM | POA: Diagnosis not present

## 2021-04-30 DIAGNOSIS — R634 Abnormal weight loss: Secondary | ICD-10-CM | POA: Diagnosis not present

## 2021-04-30 DIAGNOSIS — R1013 Epigastric pain: Secondary | ICD-10-CM | POA: Diagnosis not present

## 2021-04-30 MED ORDER — SODIUM CHLORIDE 0.9 % IV SOLN
500.0000 mL | Freq: Once | INTRAVENOUS | Status: DC
Start: 2021-04-30 — End: 2021-05-29

## 2021-04-30 NOTE — Progress Notes (Signed)
Called to room to assist during endoscopic procedure.  Patient ID and intended procedure confirmed with present staff. Received instructions for my participation in the procedure from the performing physician.  

## 2021-04-30 NOTE — Patient Instructions (Addendum)
There were some innocent-appearing stomach polyps - biopsies taken.  I took biopsies of the lower stomach also.  I did not see anything that looks dangerous or bad.  Not sure why you are nauseous. Primidone may cause nausea though I think you have been on for some time.  Stop pantoprazole - if it was going to help should have done so by now.  Once I get biopsy results will contact you. Will see if that tells [place] why you are nauseous.  I appreciate the opportunity to care for you. Gatha Mayer, MD, [facility]   Await pathology results.  Discontinue your pantoprazole.  YOU HAD AN ENDOSCOPIC PROCEDURE TODAY AT [facility]:   Refer to the procedure report that was given to you for any specific questions about what was found during the examination.  If the procedure report does not answer your questions, please call your gastroenterologist to clarify.  If you requested that your care partner not be given the details of your procedure findings, then the procedure report has been included in a sealed envelope for you to review at your convenience later.  YOU SHOULD EXPECT: Some feelings of bloating in the abdomen. Passage of more gas than usual.  Walking can help get rid of the air that was put into your GI tract during the procedure and reduce the bloating. If you had a lower endoscopy (such as a colonoscopy or flexible sigmoidoscopy) you may notice spotting of blood in your stool or on the toilet paper. If you underwent a bowel prep for your procedure, you may not have a normal bowel movement for a few days.  Please Note:  You might notice some irritation and congestion in your nose or some drainage.  This is from the oxygen used during your procedure.  There is no need for concern and it should clear up in a day or so.  SYMPTOMS TO REPORT IMMEDIATELY:   Following upper endoscopy (EGD)  Vomiting of blood or coffee ground material  New chest pain or pain under the shoulder  blades  Painful or persistently difficult swallowing  New shortness of breath  Fever of 100F or higher  Black, tarry-looking stools  For urgent or emergent issues, a gastroenterologist can be reached at any hour by calling [phone]. Do not use MyChart messaging for urgent concerns.    DIET:  We do recommend a small meal at first, but then you may proceed to your regular diet.  Drink plenty of fluids but you should avoid alcoholic beverages for 24 hours.  ACTIVITY:  You should plan to take it easy for the rest of today and you should NOT DRIVE or use heavy machinery until tomorrow (because of the sedation medicines used during the test).    FOLLOW UP: Our staff will call the number listed on your records 48-72 hours following your procedure to check on you and address any questions or concerns that you may have regarding the information given to you following your procedure. If we do not reach you, we will leave a message.  We will attempt to reach you two times.  During this call, we will ask if you have developed any symptoms of COVID 19. If you develop any symptoms (ie: fever, flu-like symptoms, shortness of breath, cough etc.) before then, please call [phone].  If you test positive for Covid 19 in the 2 weeks post procedure, please call and report this information to [place].    If any  biopsies were taken you will be contacted by phone or by letter within the next 1-3 weeks.  Please call us at [phone] if you have not heard about the biopsies in 3 weeks.    SIGNATURES/CONFIDENTIALITY: You and/or your care partner have signed paperwork which will be entered into your electronic medical record.  These signatures attest to the fact that that the information above on your After Visit Summary has been reviewed and is understood.  Full responsibility of the confidentiality of this discharge information lies with you and/or your care-partner.

## 2021-04-30 NOTE — Progress Notes (Signed)
Medical history reviewed with no changes noted. VS assessed by C.W 

## 2021-04-30 NOTE — Progress Notes (Signed)
Report to PACU, RN, vss, BBS= Clear.  

## 2021-04-30 NOTE — Op Note (Signed)
[facility] Patient Name: Kellie Humphrey Procedure Date: 04/30/2021 9:04 AM MRN: [id number] Endoscopist: Gatha Mayer , MD Age: 75 Referring MD:  Date of Birth: [birth year] Gender: Female Account #: [id number] Procedure:                Upper GI endoscopy Indications:              Nausea, Weight loss Medicines:                Propofol per Anesthesia, Monitored Anesthesia Care Procedure:                Pre-Anesthesia Assessment:                           - Prior to the procedure, a History and Physical                            was performed, and patient medications and                            allergies were reviewed. The patient's tolerance of                            previous anesthesia was also reviewed. The risks                            and benefits of the procedure and the sedation                            options and risks were discussed with the patient.                            All questions were answered, and informed consent                            was obtained. Prior Anticoagulants: The patient has                            taken no previous anticoagulant or antiplatelet                            agents. ASA Grade Assessment: III - A patient with                            severe systemic disease. After reviewing the risks                            and benefits, the patient was deemed in                            satisfactory condition to undergo the procedure.                           After obtaining informed consent, the endoscope was  passed under direct vision. Throughout the                            procedure, the patient's blood pressure, pulse, and                            oxygen saturations were monitored continuously. The                            Endoscope was introduced through the mouth, and                            advanced to the second part of duodenum. The upper                            GI  endoscopy was accomplished without difficulty.                            The patient tolerated the procedure well. Scope In: Scope Out: Findings:                 Multiple small sessile polyps were found in the                            gastric fundus and in the gastric body. Biopsies                            were taken with a cold forceps for histology.                            Verification of patient identification for the                            specimen was done. Estimated blood loss was minimal.                           Patchy mildly erythematous mucosa was found in the                            gastric antrum. Biopsies were taken with a cold                            forceps for histology. Verification of patient                            identification for the specimen was done. Estimated                            blood loss was minimal.                           The exam was otherwise without abnormality.  The cardia and gastric fundus were normal on                            retroflexion.                           The gastroesophageal flap valve was visualized                            endoscopically and classified as Hill Grade I                            (prominent fold, tight to endoscope). Complications:            No immediate complications. Estimated Blood Loss:     Estimated blood loss was minimal. Impression:               - Multiple gastric polyps. Biopsied.                           - Erythematous mucosa in the antrum. Biopsied.                           - The examination was otherwise normal.                           - Gastroesophageal flap valve classified as Hill                            Grade I (prominent fold, tight to endoscope). Recommendation:           - Patient has a contact number available for                            emergencies. The signs and symptoms of potential                            delayed  complications were discussed with the                            patient. Return to normal activities tomorrow.                            Written discharge instructions were provided to the                            patient.                           - DC pantoprazole - has nopt helped and no need                            seen on EGD                           ? if delayed effect of primidone  Denies anxiety stress which causes nausea often Gatha Mayer, MD 04/30/2021 9:37:49 AM This report has been signed electronically.

## 2021-05-02 ENCOUNTER — Telehealth: Payer: Self-pay

## 2021-05-02 NOTE — Telephone Encounter (Signed)
Left message on answering machine. 

## 2021-05-22 NOTE — Telephone Encounter (Signed)
I called her  We will do a CT abd/pelvis with contrast re: weight loss and nausea and upper abdominal pain  Please do a CMET and lipase prior to that also  Note for me:  We discussed possible mirtazapine - if CT neg Would need to review w/ cardiology given possible interaction w/ flecanide

## 2021-05-29 ENCOUNTER — Other Ambulatory Visit: Payer: Self-pay | Admitting: Internal Medicine

## 2021-05-29 MED ORDER — ESOMEPRAZOLE MAGNESIUM 40 MG PO CPDR: 40.0000 mg | DELAYED_RELEASE_CAPSULE | Freq: Every day | ORAL | 0 refills | Status: DC

## 2021-07-06 DIAGNOSIS — Z23 Encounter for immunization: Secondary | ICD-10-CM | POA: Diagnosis not present

## 2021-07-31 ENCOUNTER — Other Ambulatory Visit (INDEPENDENT_AMBULATORY_CARE_PROVIDER_SITE_OTHER): Payer: PPO

## 2021-07-31 ENCOUNTER — Ambulatory Visit: Payer: PPO | Admitting: Internal Medicine

## 2021-07-31 ENCOUNTER — Encounter: Payer: Self-pay | Admitting: Internal Medicine

## 2021-07-31 VITALS — BP 100/72 | HR 62 | Ht 67.0 in | Wt 130.0 lb

## 2021-07-31 DIAGNOSIS — R634 Abnormal weight loss: Secondary | ICD-10-CM

## 2021-07-31 DIAGNOSIS — R1013 Epigastric pain: Secondary | ICD-10-CM | POA: Diagnosis not present

## 2021-07-31 DIAGNOSIS — R110 Nausea: Secondary | ICD-10-CM

## 2021-07-31 LAB — CBC WITH DIFFERENTIAL/PLATELET
Basophils Absolute: 0 10*3/uL (ref 0.0–0.1)
Basophils Relative: 0.8 % (ref 0.0–3.0)
Eosinophils Absolute: 0.3 10*3/uL (ref 0.0–0.7)
Eosinophils Relative: 8.6 % — ABNORMAL HIGH (ref 0.0–5.0)
HCT: 39.4 % (ref 36.0–46.0)
Hemoglobin: 13.1 g/dL (ref 12.0–15.0)
Lymphocytes Relative: 23.5 % (ref 12.0–46.0)
Lymphs Abs: 0.9 10*3/uL (ref 0.7–4.0)
MCHC: 33.3 g/dL (ref 30.0–36.0)
MCV: 89 fl (ref 78.0–100.0)
Monocytes Absolute: 0.6 10*3/uL (ref 0.1–1.0)
Monocytes Relative: 14.6 % — ABNORMAL HIGH (ref 3.0–12.0)
Neutro Abs: 2.1 10*3/uL (ref 1.4–7.7)
Neutrophils Relative %: 52.5 % (ref 43.0–77.0)
Platelets: 251 10*3/uL (ref 150.0–400.0)
RBC: 4.43 Mil/uL (ref 3.87–5.11)
RDW: 13.6 % (ref 11.5–15.5)
WBC: 3.9 10*3/uL — ABNORMAL LOW (ref 4.0–10.5)

## 2021-07-31 LAB — COMPREHENSIVE METABOLIC PANEL
ALT: 13 U/L (ref 0–35)
AST: 20 U/L (ref 0–37)
Albumin: 4.2 g/dL (ref 3.5–5.2)
Alkaline Phosphatase: 92 U/L (ref 39–117)
BUN: 10 mg/dL (ref 6–23)
CO2: 32 mEq/L (ref 19–32)
Calcium: 9.3 mg/dL (ref 8.4–10.5)
Chloride: 102 mEq/L (ref 96–112)
Creatinine, Ser: 0.71 mg/dL (ref 0.40–1.20)
GFR: 84.24 mL/min (ref >60.00)
Glucose, Bld: 79 mg/dL (ref 70–99)
Potassium: 3.8 mEq/L (ref 3.5–5.1)
Sodium: 138 mEq/L (ref 135–145)
Total Bilirubin: 0.3 mg/dL (ref 0.2–1.2)
Total Protein: 7.3 g/dL (ref 6.0–8.3)

## 2021-07-31 NOTE — Progress Notes (Signed)
Kellie Humphrey 75 y.o. [birth year] [id number]  Assessment & Plan:   Encounter Diagnoses  Name Primary?   Nausea without vomiting Yes   Loss of weight    Epigastric discomfort    Its not clear to me what is going on.  She had some gastritis or gastropathy but several months of PPI treatment has not made any difference.  Her weight loss has stabilized but she still does not feel well in her age group I think it is reasonable to pursue a CT scan so we will do so.  Labs ordered as well.  C-Met and CBC.  I still wonder about the possibility of medication side effects though her medications have been stable so that makes that less likely but not impossible.  She seems relieved that her mother passed away after several years of Alzheimer's so that seems to be an unlikely trigger though it is a major life event to lose a parent.  Flecainide and arrhythmia limits treatment quite a bit cannot use ondansetron we talked about the possibility of mirtazapine which often helps with nausea and that could potentially interact and prolong QT.   Orders Placed This Encounter  Procedures   CT Abdomen Pelvis W Contrast   CBC with Differential/Platelet   Comprehensive metabolic panel     CC: Kellie Humphrey., MD    Subjective:   Chief Complaint: Nausea  HPI The patient is a 75 year old woman who was seen in our office for nausea without vomiting and then had an EGD.  She initially saw an advanced practice provider.  She has been on a PPI she had an increased.  EGD demonstrated some gastric polyps and patchy erythema.  Pathology results below.  We changed her PPI but she got no better no benefit.  She has been the same for months.  There was about a 7 pound weight loss before it has leveled off and she did have some epigastric discomfort.  Food does not seem to make this significantly worse or better.  Spicy foods may exacerbate it somewhat.  She sleeps okay.  She says when she wakes up the  nausea is there and "my stomach seems to rumble".  There is no change in bowel habit.  Colonoscopy is up-to-date performed in 2019.  She denies significant stressors, her mother did pass away this summer but she had been ill for 2 or 3 years with Alzheimer's and Kellie Humphrey thinks that was more of a relief than anything and does not think the nausea would be related to that.  There have been no medication changes.  We have tried to prescribe some symptomatic medications like ondansetron but potential interactions with flecainide and arrhythmia disturbance are problematic so that is not done.  Wt Readings from Last 3 Encounters:  07/31/21 130 lb (59 kg)  04/30/21 130 lb (59 kg)  04/29/21 130 lb (59 kg)   Diagnosis 1. Surgical [P], gastric antrum and body - MILD REACTIVE GASTROPATHY. Kellie Humphrey IS NEGATIVE FOR HELICOBACTER PYLORI. - NO INTESTINAL METAPLASIA, DYSPLASIA, OR MALIGNANCY. 2. Surgical [P], gastric body and fundus - REACTIVE HYPERPLASTIC CHANGES, SEE COMMENT. - WARTHIN-STARRY IS NEGATIVE FOR HELICOBACTER PYLORI. - NO INTESTINAL METAPLASIA, DYSPLASIA, OR MALIGNANCY.  Allergies  Allergen Reactions   Codeine Nausea And Vomiting   Current Meds  Medication Sig   acetaminophen (TYLENOL) 500 MG tablet Take 500-1,000 mg by mouth See admin instructions. Take 500 mg at night, may take a second 500 -1000 mg dose as needed  for pain   aspirin EC 81 MG tablet Take 1 tablet (81 mg total) by mouth daily. Swallow whole.   Biotin 5000 MCG CAPS Take 5,000 mcg by mouth every evening.   Cholecalciferol (VITAMIN D) 2000 units tablet Take 4,000 Units by mouth daily.   Cyanocobalamin (B-12) 5000 MCG CAPS Take 5,000 mcg by mouth once a week. Mondays & Fridays.   esomeprazole (NEXIUM) 40 MG capsule Take 1 capsule (40 mg total) by mouth daily before breakfast.   flecainide (TAMBOCOR) 50 MG tablet Take 1 tablet (50 mg total) by mouth 2 (two) times daily.   gabapentin (NEURONTIN) 600 MG tablet Take 600  mg by mouth 2 (two) times daily.   ibuprofen (ADVIL) 200 MG tablet Take 400 mg by mouth as needed.   levothyroxine (SYNTHROID) 50 MCG tablet Take 1 tablet (50 mcg total) by mouth daily before breakfast.   metoprolol tartrate (LOPRESSOR) 25 MG tablet Take 0.5 tablets (12.5 mg total) by mouth 2 (two) times daily.   Omega-3 Fatty Acids (FISH OIL) 1000 MG CAPS Take by mouth in the morning and at bedtime.   primidone (MYSOLINE) 50 MG tablet Take 50 mg by mouth at bedtime.   SYSTANE ULTRA 0.4-0.3 % SOLN Place 1 drop into the left eye every 4 (four) hours as needed (dry/irritated eyes. (4-6 times daily)).    Current Facility-Administered Medications for the 07/31/21 encounter (Office Visit) with Kellie Mayer, MD  Medication   bupivacaine (MARCAINE) 0.5 % (with pres) injection 3 mL   Past Medical History:  Diagnosis Date   Anxiety disorder    Arthritis    Breast cancer ([place])    s/p lumpectomy   Cataract    Dense breast 09/06/2013   Dysrhythmia    PACs   Essential tremor    /familial   Heart murmur    corrected with valve surgery   Hypothyroidism    Interstitial cystitis    Malignant neoplasm of lower-outer quadrant of right breast of female, estrogen receptor positive ([place]) 02/11/2012   Mitral valve disorder 05/08/2009   Qualifier: Diagnosis of  By: [name], RN, [name]     MVP (mitral valve prolapse)    Neuropathy    Nonrheumatic mitral valve regurgitation    PAC (premature atrial contraction)    Palpitations    Paresthesias 07/29/2014   Personal history of breast cancer 09/06/2013   PREMATURE ATRIAL CONTRACTIONS 05/08/2009   Qualifier: Diagnosis of  By: [name], RN, [name]     Radiculopathy, cervical region    RLS (restless legs syndrome)    S/P minimally-invasive mitral valve repair 02/10/2020   Complex valvuloplasty including artificial Gore-tex neochord placement x8 with edge-to-edge suture plication of anterior commissure and 32 mm [name] 4D ring annuloplasty via right mini  thoracotomy approach   Thyroid disease    Tremor 12/08/2013   Varicose veins of right lower extremity with complications 0/96/0454   Past Surgical History:  Procedure Laterality Date   bilateral cataract repair  july/aug 2021   Dr Tommy Rainwater   BREAST BIOPSY  2009   BREAST LUMPECTOMY     CARDIAC CATHETERIZATION     DILATION AND CURETTAGE OF UTERUS     EYE SURGERY Left    Pterygium   HEMANGIOMA EXCISION Left 1990   arm   MELANOMA EXCISION Right 1980   thigh   MITRAL VALVE REPAIR Right 02/10/2020   Procedure: MINIMALLY INVASIVE MITRAL VALVE REPAIR (MVR) using Memo 4D 32 MM Mitral Valve Ring.;  Surgeon: Rexene Alberts,  MD;  Location: [facility] OR;  Service: Open Heart Surgery;  Laterality: Right;   NECK SURGERY  10/2009   C5-6 fusion   RIGHT/LEFT HEART CATH AND CORONARY ANGIOGRAPHY N/A 01/04/2020   Procedure: RIGHT/LEFT HEART CATH AND CORONARY ANGIOGRAPHY;  Surgeon: Burnell Blanks, MD;  Location: [facility];  Service: Cardiovascular;  Laterality: N/A;   SHOULDER ARTHROSCOPY     spider vein treatment     TEE WITHOUT CARDIOVERSION N/A 06/30/2018   Procedure: TRANSESOPHAGEAL ECHOCARDIOGRAM (TEE);  Surgeon: Sueanne Margarita, MD;  Location: [facility] ENDOSCOPY;  Service: Cardiovascular;  Laterality: N/A;   TEE WITHOUT CARDIOVERSION N/A 12/12/2019   Procedure: TRANSESOPHAGEAL ECHOCARDIOGRAM (TEE);  Surgeon: Dorothy Spark, MD;  Location: [facility];  Service: Cardiovascular;  Laterality: N/A;   TEE WITHOUT CARDIOVERSION N/A 02/10/2020   Procedure: TRANSESOPHAGEAL ECHOCARDIOGRAM (TEE);  Surgeon: Rexene Alberts, MD;  Location: [place];  Service: Open Heart Surgery;  Laterality: N/A;   TONSILLECTOMY     uterine fibroids removed     Social History   Social History Narrative   Patient is married to [name], has 2 children   Patient is right handed   Education level is Bachelor   Caffeine consumption is none other than in chocolate occasionally       family history includes Alzheimer's  disease in her mother; Coronary artery disease in her mother; Fibromyalgia in her sister; Heart disease in her maternal grandmother; Hypertension in her mother; Neuropathy in her father; Osteoarthritis in her father; Osteoporosis in her mother; Other in her father; Tremor in her father.   Review of Systems  As per HPI Objective:   Physical Exam BP 100/72   Pulse 62   Ht _0  (1.702 m)   Wt 130 lb (59 kg)   SpO2 99%   BMI 20.36 kg/m  Abdomen is soft nontender benign bowel sounds are present perhaps a bit increased

## 2021-07-31 NOTE — Patient Instructions (Signed)
Your provider has requested that you go to the basement level for lab work before leaving today. Press "B" on the elevator. The lab is located at the first door on the left as you exit the elevator.  Due to recent changes in healthcare laws, you may see the results of your imaging and laboratory studies on MyChart before your provider has had a chance to review them.  We understand that in some cases there may be results that are confusing or concerning to you. Not all laboratory results come back in the same time frame and the provider may be waiting for multiple results in order to interpret others.  Please give [place] 48 hours in order for your provider to thoroughly review all the results before contacting the office for clarification of your results.     You have been scheduled for a CT scan of the abdomen and pelvis at [place] ([address]---this is in the same building as [employer]).   You are scheduled on 08/08/21 at 1:30pm. You should arrive 15 minutes prior to your appointment time for registration. Please follow the written instructions below on the day of your exam:  WARNING: IF YOU ARE ALLERGIC TO IODINE/X-RAY DYE, PLEASE NOTIFY RADIOLOGY IMMEDIATELY AT [phone]! YOU WILL BE GIVEN A 13 HOUR PREMEDICATION PREP.  1) Do not eat or drink anything after 9:30am (4 hours prior to your test) 2) You have been given 2 bottles of oral contrast to drink. The solution may taste better if refrigerated, but do NOT add ice or any other liquid to this solution. Shake well before drinking.    Drink 1 bottle of contrast @ 11:30am (2 hours prior to your exam)  Drink 1 bottle of contrast @ 12:30pm (1 hour prior to your exam)  You may take any medications as prescribed with a small amount of water, if necessary. If you take any of the following medications: METFORMIN, GLUCOPHAGE, GLUCOVANCE, AVANDAMET, RIOMET, FORTAMET, [place] MET, JANUMET, GLUMETZA or METAGLIP, you MAY  be asked to HOLD this medication 48 hours AFTER the exam.  The purpose of you drinking the oral contrast is to aid in the visualization of your intestinal tract. The contrast solution may cause some diarrhea. Depending on your individual set of symptoms, you may also receive an intravenous injection of x-ray contrast/dye. Plan on being at [facility] for 30 minutes or longer, depending on the type of exam you are having performed.  This test typically takes 30-45 minutes to complete.  If you have any questions regarding your exam or if you need to reschedule, you may call the CT department at [phone] between the hours of 8:00 am and 5:00 pm, Monday-Friday.  ________________________________________________________________________ I appreciate the opportunity to care for you. Silvano Rusk, MD, [facility]

## 2021-08-06 NOTE — Progress Notes (Deleted)
Cardiology Office Note:    Date:  08/06/2021   ID:  [name], DOB 1946/04/15, MRN [id number]  PCP:  Ginger Organ., MD   [facility] Providers Cardiologist:  Ena Dawley, MD (Inactive) {     Referring MD: Ginger Organ., MD    History of Present Illness:    Kellie Humphrey is a 75 y.o. female with a hx of MVP with severe MR s/p minimally invasive mitral valve repair by Dr. Roxy Manns on 02/10/20 with course complicated by post-op Afib, PACs/PVCs previously on flecainide, breast cancer s/p lumpectomy, essential tremor, chronic cystitis, and anxiety who was previously followed by Dr. [name] who now returns to clinic for follow-up.  Per review of the record, patient has history of MVP with TTE 05/2019 with severe MR, EF 60-65%. The patient underwent minimally invasive mitral valve repair using Memo 4D 32 MM Mitral Valve Ring by Dr. Roxy Manns on Feb 10, 2020.  She underwent cardiac catheterization prior to surgery with no evidence for coronary artery disease.  Post-op course was complicated by atrial fibrillation with quickly cardioverted after initiation of IV amiodarone but she continued to have paroxysmal A. fib over the next few days, she was sent home on amiodarone 200 mg PO BID. Flecainide that was previously started for frequent PVCs and PAC was discontinued at that time but has since been resumed as patient has been off amio.   Last saw Dr. [name] on 11/06/20 where she was doing well. Palpitations improved. Exercise capacity improved and she was walking 3-61miles per day without symptoms. Euvolemic on exam. TTE 03/26/20 with LVEF 60-65%, MV ring in place with mildly elevated mean gradient of 24mmHg, trivial MR, normal PASP, mild-to-moderate TR, RAP 52mmHg.  Today ***  Past Medical History:  Diagnosis Date   Anxiety disorder    Arthritis    Breast cancer ([place])    s/p lumpectomy   Cataract    Dense breast 09/06/2013   Dysrhythmia    PACs   Essential tremor     /familial   Heart murmur    corrected with valve surgery   Hypothyroidism    Interstitial cystitis    Malignant neoplasm of lower-outer quadrant of right breast of female, estrogen receptor positive ([place]) 02/11/2012   Mitral valve disorder 05/08/2009   Qualifier: Diagnosis of  By: [name], RN, Nyjah Denio     MVP (mitral valve prolapse)    Neuropathy    Nonrheumatic mitral valve regurgitation    PAC (premature atrial contraction)    Palpitations    Paresthesias 07/29/2014   Personal history of breast cancer 09/06/2013   PREMATURE ATRIAL CONTRACTIONS 05/08/2009   Qualifier: Diagnosis of  By: [name], RN, Bria Portales     Radiculopathy, cervical region    RLS (restless legs syndrome)    S/P minimally-invasive mitral valve repair 02/10/2020   Complex valvuloplasty including artificial Gore-tex neochord placement x8 with edge-to-edge suture plication of anterior commissure and 32 mm [name] 4D ring annuloplasty via right mini thoracotomy approach   Thyroid disease    Tremor 12/08/2013   Varicose veins of right lower extremity with complications 9/74/1638    Past Surgical History:  Procedure Laterality Date   bilateral cataract repair  july/aug 2021   Dr Tommy Rainwater   BREAST BIOPSY  2009   BREAST LUMPECTOMY     CARDIAC CATHETERIZATION     DILATION AND CURETTAGE OF UTERUS     EYE SURGERY Left    Pterygium   HEMANGIOMA EXCISION Left  1990   arm   MELANOMA EXCISION Right 1980   thigh   MITRAL VALVE REPAIR Right 02/10/2020   Procedure: MINIMALLY INVASIVE MITRAL VALVE REPAIR (MVR) using Memo 4D 32 MM Mitral Valve Ring.;  Surgeon: Rexene Alberts, MD;  Location: [facility];  Service: Open Heart Surgery;  Laterality: Right;   NECK SURGERY  10/2009   C5-6 fusion   RIGHT/LEFT HEART CATH AND CORONARY ANGIOGRAPHY N/A 01/04/2020   Procedure: RIGHT/LEFT HEART CATH AND CORONARY ANGIOGRAPHY;  Surgeon: Burnell Blanks, MD;  Location: [facility];  Service: Cardiovascular;  Laterality: N/A;   SHOULDER  ARTHROSCOPY     spider vein treatment     TEE WITHOUT CARDIOVERSION N/A 06/30/2018   Procedure: TRANSESOPHAGEAL ECHOCARDIOGRAM (TEE);  Surgeon: Sueanne Margarita, MD;  Location[facility];  Service: Cardiovascular;  Laterality: N/A;   TEE WITHOUT CARDIOVERSION N/A 12/12/2019   Procedure: TRANSESOPHAGEAL ECHOCARDIOGRAM (TEE);  Surgeon: Dorothy Spark, MD;  Location: [facility] [facility];  Service: Cardiovascular;  Laterality: N/A;   TEE WITHOUT CARDIOVERSION N/A 02/10/2020   Procedure: TRANSESOPHAGEAL ECHOCARDIOGRAM (TEE);  Surgeon: Rexene Alberts, MD;  Location: [place];  Service: Open Heart Surgery;  Laterality: N/A;   TONSILLECTOMY     uterine fibroids removed      Current Medications: No outpatient medications have been marked as taking for the 08/20/21 encounter (Appointment) with Freada Bergeron, MD.   Current Facility-Administered Medications for the 08/20/21 encounter (Appointment) with Freada Bergeron, MD  Medication   bupivacaine (MARCAINE) 0.5 % (with pres) injection 3 mL     Allergies:   Codeine   Social History   Socioeconomic History   Marital status: Married    Spouse name: [name]   Number of children: 2   Years of education: Bachelor   Highest education level: Not on file  Occupational History   Not on file  Tobacco Use   Smoking status: Never   Smokeless tobacco: Never  Vaping Use   Vaping Use: Never used  Substance and Sexual Activity   Alcohol use: Yes    Comment: Socially   Drug use: No   Sexual activity: Yes    Birth control/protection: Post-menopausal  Other Topics Concern   Not on file  Social History Narrative   Patient is married to [place], has 2 children   Patient is right handed   Education level is Bachelor   Caffeine consumption is none other than in chocolate occasionally       Social Determinants of Radio broadcast assistant Strain: Not on file  Food Insecurity: Not on file  Transportation Needs: Not on file  Physical  Activity: Not on file  Stress: Not on file  Social Connections: Not on file     Family History: The patient's ***family history includes Alzheimer's disease in her mother; Coronary artery disease in her mother; Fibromyalgia in her sister; Heart disease in her maternal grandmother; Hypertension in her mother; Neuropathy in her father; Osteoarthritis in her father; Osteoporosis in her mother; Other in her father; Tremor in her father. There is no history of Colon polyps, Esophageal cancer, Pancreatic cancer, Stomach cancer, Rectal cancer, Ovarian cancer, Breast cancer, Colon cancer, Prostate cancer, or Endometrial cancer.  ROS:   Please see the history of present illness.    *** All other systems reviewed and are negative.  EKGs/Labs/Other Studies Reviewed:    The following studies were reviewed today: Zio 03/24/20: HR of 64 bpm, max HR of 125 bpm, and avg  HR of 73 BPM. Few very shors runs of SVT< the longest lasting 5 beats. Ocassional PACs and PVs. No atrial fibrillation or ventricular tachycardia.   Sinus rhythm to sinus tachycardia. Benign heart monitor. Patient's diary correlates with PVCs.  TTE 02/2020: IMPRESSIONS   1. The first postoperative TTE, LVEF 55-60% (previously 60-65%). Since  the last study there is new mitral valve ring with mildly elevated mean  gradient 7 mmHg and only trivial mitral regurgitation. Normal RVSP.   2. Left ventricular ejection fraction, by estimation, is 55 to 60%. The  left ventricle has normal function. The left ventricle has no regional  wall motion abnormalities. Left ventricular diastolic function could not  be evaluated.   3. Right ventricular systolic function is normal. The right ventricular  size is normal. There is normal pulmonary artery systolic pressure. The  estimated right ventricular systolic pressure is 90.2 mmHg.   4. The mitral valve has been repaired/replaced. Trivial mitral valve  regurgitation. Mild to moderate mitral  stenosis. The mean mitral valve  gradient is 7.0 mmHg with average heart rate of 68 bpm. There is a 32 mm  Memo 4D Mitral Valve Ring present in the   mitral position. Procedure Date: 02/10/2020.   5. Tricuspid valve regurgitation is mild to moderate.   6. The aortic valve is normal in structure. Aortic valve regurgitation is  not visualized. No aortic stenosis is present.   7. The inferior vena cava is normal in size with greater than 50%  respiratory variability, suggesting right atrial pressure of 3 mmHg.   TTE 06/17/18 Myxomatous, moderately thickened valve with bileaflet prolapse.   Mitral regurgitation jet is central and at least moderate to   severe with reversal of forward flow in the pulmonary veins.   However, left atrial size is normal and so are right sided   pressures. A TEE is recommended for further evaluation.   Echo TEE 06/30/18 - Left ventricle: Systolic function was normal. The estimated   ejection fraction was in the range of 60% to 65%. Wall motion was   normal; there were no regional wall motion abnormalities. - Aortic valve: Trileaflet; mildly thickened leaflets. There was   trivial regurgitation. - Mitral valve: Moderate, holosystolicprolapse, involving the   anterior leaflet and the posterior leaflet. There was moderate to   severe regurgitation. Effective regurgitant orifice (PISA): 0.22   cm^2. Regurgitant volume (PISA): 42 ml. - Left atrium: No evidence of thrombus in the atrial cavity or   appendage. - Right atrium: The atrium was mildly dilated. No evidence of   thrombus in the atrial cavity or appendage. - Tricuspid valve: There was mild regurgitation. - Pulmonic valve: There was trivial regurgitation.   TTE: 05/2019    1. Left ventricular ejection fraction, by visual estimation, is 65 to  70%. The left ventricle has normal function. Normal left ventricular size.  There is no left ventricular hypertrophy.   2. Left ventricular diastolic Doppler  parameters are consistent with  impaired relaxation pattern of LV diastolic filling.   3. Global right ventricle has normal systolic function.The right  ventricular size is normal. No increase in right ventricular wall  thickness.   4. Left atrial size was normal.   5. Right atrial size was normal.   6. Moderate to severe mitral valve regurgitation.   7. MV leaflets have myxomatous appearance There is mild prolapse of both  leaflets There is moderate to severe MR.   8. The tricuspid valve is normal in structure.  Tricuspid valve  regurgitation is mild.   9. The aortic valve is tricuspid Aortic valve regurgitation was not  visualized by color flow Doppler. Mild aortic valve sclerosis without  stenosis.  10. The pulmonic valve was normal in structure. Pulmonic valve  regurgitation is mild by color flow Doppler.    ETT on flecanide     Study Highlights      Blood pressure demonstrated a normal response to exercise. No T wave inversion was noted during stress. Upsloping ST segment depression ST segment depression of 1 mm was noted during stress in the II, III, aVF, V6, V5 and V4 leads, and returning to baseline after less than 1 minute of recovery. The patient experienced no angina during the stress test Overall, the patient's exercise capacity was excellent. Duke Treadmill Score: low risk   Negative stress test without evidence of ischemia at given workload. Excellent exercise capacity. Frequent PVC's noted in early recovery.      48 hour Holter 06/09/18   Sinus bradycardia to sinus tachycardia. Infrequent PACs and frequent PVCs (1600 in 48 hrs = 1 % of all beats). SVT runs the longest consisting of 15 beats.   Sinus bradycardia to sinus tachycardia. Infrequent PACs and frequent PVCs (1600 in 48 hrs = 1 % of all beats). SVT runs the longest consisting of 15 beats. Symptoms correlating with PVCs.   EKG:  EKG is *** ordered today.  The ekg ordered today demonstrates ***  Recent  Labs: 10/24/2020: Magnesium 2.1; TSH 1.950 07/31/2021: ALT 13; BUN 10; Creatinine, Ser 0.71; Hemoglobin 13.1; Platelets 251.0; Potassium 3.8; Sodium 138  Recent Lipid Panel No results found for: CHOL, TRIG, HDL, CHOLHDL, VLDL, LDLCALC, LDLDIRECT   Risk Assessment/Calculations:   {Does this patient have ATRIAL FIBRILLATION?[phone]}       Physical Exam:    VS:  There were no vitals taken for this visit.    Wt Readings from Last 3 Encounters:  07/31/21 130 lb (59 kg)  04/30/21 130 lb (59 kg)  04/29/21 130 lb (59 kg)     GEN: *** Well nourished, well developed in no acute distress HEENT: Normal NECK: No JVD; No carotid bruits LYMPHATICS: No lymphadenopathy CARDIAC: ***RRR, no murmurs, rubs, gallops RESPIRATORY:  Clear to auscultation without rales, wheezing or rhonchi  ABDOMEN: Soft, non-tender, non-distended MUSCULOSKELETAL:  No edema; No deformity  SKIN: Warm and dry NEUROLOGIC:  Alert and oriented x 3 PSYCHIATRIC:  Normal affect   ASSESSMENT:    No diagnosis found. PLAN:    In order of problems listed above:  #MVP with severe MR s/p minimally inavsive MVR. Underwent minimally invasive mitral valve repair using Memo 4D 32 MM Mitral Valve Ring by Dr. Roxy Manns on Feb 10, 2020. TTE on 02/2020 with mean mitral  gradient 10mmHg at HR 68bpm. Trivial MR. EF 55-60%. Doing well without HF or anginal symptoms. NYHA class I. -Will continue serial monitoring  #Post-op Paroxysmal Afib: Remains in NSR without recurrence. Stopped amio and warfarin. Has been maintained on low dose flec and metop. -Continue metop 12.5mg  BID -Continue flec 50mg  daily  #Symptomatic PVCs/PACs: -Continue flec 50mg  daily and metop 12.5mg  BID as above     {Are you ordering a CV Procedure (e.g. stress test, cath, DCCV, TEE, etc)?   Press F2        :[id number]}    Medication Adjustments/Labs and Tests Ordered: Current medicines are reviewed at length with the patient today.  Concerns regarding medicines  are outlined above.  No orders of the  defined types were placed in this encounter.  No orders of the defined types were placed in this encounter.   There are no Patient Instructions on file for this visit.   Signed, Freada Bergeron, MD  08/06/2021 8:58 PM    [place]

## 2021-08-07 ENCOUNTER — Other Ambulatory Visit: Payer: Self-pay | Admitting: Internal Medicine

## 2021-08-07 ENCOUNTER — Other Ambulatory Visit: Payer: Self-pay | Admitting: Obstetrics & Gynecology

## 2021-08-07 DIAGNOSIS — Z1231 Encounter for screening mammogram for malignant neoplasm of breast: Secondary | ICD-10-CM

## 2021-08-08 ENCOUNTER — Other Ambulatory Visit: Payer: Self-pay

## 2021-08-08 ENCOUNTER — Ambulatory Visit (INDEPENDENT_AMBULATORY_CARE_PROVIDER_SITE_OTHER)
Admission: RE | Admit: 2021-08-08 | Discharge: 2021-08-08 | Disposition: A | Discharge status: Home / Self Care | Payer: PPO | Source: Ambulatory Visit | Attending: Internal Medicine | Admitting: Internal Medicine

## 2021-08-08 DIAGNOSIS — R1013 Epigastric pain: Secondary | ICD-10-CM

## 2021-08-08 DIAGNOSIS — R110 Nausea: Secondary | ICD-10-CM | POA: Diagnosis not present

## 2021-08-08 DIAGNOSIS — R634 Abnormal weight loss: Secondary | ICD-10-CM

## 2021-08-08 DIAGNOSIS — K429 Umbilical hernia without obstruction or gangrene: Secondary | ICD-10-CM | POA: Diagnosis not present

## 2021-08-08 DIAGNOSIS — Z853 Personal history of malignant neoplasm of breast: Secondary | ICD-10-CM | POA: Diagnosis not present

## 2021-08-08 MED ORDER — IOHEXOL 350 MG/ML SOLN
80.0000 mL | Freq: Once | INTRAVENOUS | Status: AC | PRN
  Administered 2021-08-08: 80 mL via INTRAVENOUS

## 2021-08-09 ENCOUNTER — Telehealth: Payer: Self-pay | Admitting: *Deleted

## 2021-08-09 ENCOUNTER — Other Ambulatory Visit: Payer: Self-pay

## 2021-08-09 DIAGNOSIS — J929 Pleural plaque without asbestos: Secondary | ICD-10-CM

## 2021-08-09 DIAGNOSIS — R9389 Abnormal findings on diagnostic imaging of other specified body structures: Secondary | ICD-10-CM

## 2021-08-09 DIAGNOSIS — J984 Other disorders of lung: Secondary | ICD-10-CM

## 2021-08-09 NOTE — Telephone Encounter (Signed)
Dr Celesta Aver office called and scheduled the patient for a follow up appt with Dr Berline Lopes regarding a right ovarian cyst increase in size

## 2021-08-15 ENCOUNTER — Ambulatory Visit (HOSPITAL_COMMUNITY)
Admission: RE | Admit: 2021-08-15 | Discharge: 2021-08-15 | Disposition: A | Discharge status: Home / Self Care | Payer: PPO | Source: Ambulatory Visit | Attending: Internal Medicine | Admitting: Internal Medicine

## 2021-08-15 ENCOUNTER — Other Ambulatory Visit: Payer: Self-pay

## 2021-08-15 DIAGNOSIS — I700 Atherosclerosis of aorta: Secondary | ICD-10-CM | POA: Diagnosis not present

## 2021-08-15 DIAGNOSIS — J984 Other disorders of lung: Secondary | ICD-10-CM | POA: Diagnosis not present

## 2021-08-15 DIAGNOSIS — J929 Pleural plaque without asbestos: Secondary | ICD-10-CM | POA: Diagnosis not present

## 2021-08-15 DIAGNOSIS — R9389 Abnormal findings on diagnostic imaging of other specified body structures: Secondary | ICD-10-CM | POA: Insufficient documentation

## 2021-08-18 ENCOUNTER — Encounter: Payer: Self-pay | Admitting: Internal Medicine

## 2021-08-20 ENCOUNTER — Encounter: Payer: Self-pay | Admitting: Gynecologic Oncology

## 2021-08-20 ENCOUNTER — Encounter: Payer: Self-pay | Admitting: Cardiology

## 2021-08-20 ENCOUNTER — Ambulatory Visit: Payer: PPO | Admitting: Cardiology

## 2021-08-20 ENCOUNTER — Other Ambulatory Visit: Payer: Self-pay

## 2021-08-20 VITALS — BP 110/76 | HR 75 | Ht 67.0 in | Wt 127.4 lb

## 2021-08-20 DIAGNOSIS — I340 Nonrheumatic mitral (valve) insufficiency: Secondary | ICD-10-CM | POA: Diagnosis not present

## 2021-08-20 DIAGNOSIS — E7800 Pure hypercholesterolemia, unspecified: Secondary | ICD-10-CM | POA: Diagnosis not present

## 2021-08-20 DIAGNOSIS — I491 Atrial premature depolarization: Secondary | ICD-10-CM

## 2021-08-20 DIAGNOSIS — I493 Ventricular premature depolarization: Secondary | ICD-10-CM | POA: Diagnosis not present

## 2021-08-20 DIAGNOSIS — I480 Paroxysmal atrial fibrillation: Secondary | ICD-10-CM | POA: Diagnosis not present

## 2021-08-20 DIAGNOSIS — Z9889 Other specified postprocedural states: Secondary | ICD-10-CM

## 2021-08-20 NOTE — Patient Instructions (Signed)
Medication Instructions:   Your physician recommends that you continue on your current medications as directed. Please refer to the Current Medication list given to you today.  *If you need a refill on your cardiac medications before your next appointment, please call your pharmacy*   Follow-Up: At [facility], you and your health needs are our priority.  As part of our continuing mission to provide you with exceptional heart care, we have created designated Provider Care Teams.  These Care Teams include your primary Cardiologist (physician) and Advanced Practice Providers (APPs -  Physician Assistants and Nurse Practitioners) who all work together to provide you with the care you need, when you need it.  We recommend signing up for the patient portal called "MyChart".  Sign up information is provided on this After Visit Summary.  MyChart is used to connect with patients for Virtual Visits (Telemedicine).  Patients are able to view lab/test results, encounter notes, upcoming appointments, etc.  Non-urgent messages can be sent to your provider as well.   To learn more about what you can do with MyChart, go to [email or web address].    Your next appointment:   1 year(s)  The format for your next appointment:   In Person  Provider:    DR. Johney Frame

## 2021-08-20 NOTE — Progress Notes (Signed)
Cardiology Office Note:    Date:  08/20/2021   ID:  Kellie Humphrey, DOB 1946/02/01, MRN [id number]  PCP:  Ginger Organ., MD   [facility] Providers Cardiologist:  Ena Dawley, MD (Inactive) {     Referring MD: Ginger Organ., MD    History of Present Illness:    Kellie Humphrey is a 75 y.o. female with a hx of MVP with severe MR s/p minimally invasive mitral valve repair by Dr. Roxy Manns on 02/10/20 with course complicated by post-op Afib, PACs/PVCs previously on flecainide, breast cancer s/p lumpectomy, essential tremor, chronic cystitis, and anxiety who was previously followed by Dr. [name] who now returns to clinic for follow-up.  Per review of the record, patient has history of MVP with TTE 05/2019 with severe MR, EF 60-65%. The patient underwent minimally invasive mitral valve repair using Memo 4D 32 MM Mitral Valve Ring by Dr. Roxy Manns on Feb 10, 2020.  She underwent cardiac catheterization prior to surgery with no evidence for coronary artery disease.  Post-op course was complicated by atrial fibrillation with quickly cardioverted after initiation of IV amiodarone but she continued to have paroxysmal A. fib over the next few days, she was sent home on amiodarone 200 mg PO BID. Flecainide that was previously started for frequent PVCs and PAC was discontinued at that time but has since been resumed as patient has been off amio.   Last saw Dr. [name] on 11/06/20 where she was doing well. Palpitations improved. Exercise capacity improved and she was walking 3-63miles per day without symptoms. Euvolemic on exam. TTE 03/26/20 with LVEF 60-65%, MV ring in place with mildly elevated mean gradient of 62mmHg, trivial MR, normal PASP, mild-to-moderate TR, RAP 78mmHg.  Today, she is doing well. She is still walking 3 miles daily with no exertional symptoms. She continues to report palpitations 3 to 4 times a day. She describes the palpitations as a flutter and they do not bother her.  Amiodarone was discontinued but she takes flecainide to manage the palpitations. No associated chest pain, lightheadedness, dizziness or SOB.  Otherwise, she is doing well from a CV standpoint. She denies any chest pain, shortness of breath, lightheadedness, headaches, syncope, orthopnea, PND, or lower extremity edema.   Past Medical History:  Diagnosis Date   Anxiety disorder    Arthritis    Breast cancer ([place])    s/p lumpectomy   Cataract    Dense breast 09/06/2013   Dysrhythmia    PACs   Essential tremor    /familial   Heart murmur    corrected with valve surgery   Hypothyroidism    Interstitial cystitis    Malignant neoplasm of lower-outer quadrant of right breast of female, estrogen receptor positive ([place]) 02/11/2012   Mitral valve disorder 05/08/2009   Qualifier: Diagnosis of  By: [name], RN, Calan Doren     MVP (mitral valve prolapse)    Neuropathy    Nonrheumatic mitral valve regurgitation    PAC (premature atrial contraction)    Palpitations    Paresthesias 07/29/2014   Personal history of breast cancer 09/06/2013   PREMATURE ATRIAL CONTRACTIONS 05/08/2009   Qualifier: Diagnosis of  By: [name], RN, Emry Tobin     Radiculopathy, cervical region    RLS (restless legs syndrome)    S/P minimally-invasive mitral valve repair 02/10/2020   Complex valvuloplasty including artificial Gore-tex neochord placement x8 with edge-to-edge suture plication of anterior commissure and 32 mm [name] 4D ring annuloplasty via right mini  thoracotomy approach   Thyroid disease    Tremor 12/08/2013   Varicose veins of right lower extremity with complications 05/10/7516    Past Surgical History:  Procedure Laterality Date   bilateral cataract repair  july/aug 2021   Dr Tommy Rainwater   BREAST BIOPSY  2009   BREAST LUMPECTOMY     CARDIAC CATHETERIZATION     DILATION AND CURETTAGE OF UTERUS     EYE SURGERY Left    Pterygium   HEMANGIOMA EXCISION Left 1990   arm   MELANOMA EXCISION Right 1980   thigh    MITRAL VALVE REPAIR Right 02/10/2020   Procedure: MINIMALLY INVASIVE MITRAL VALVE REPAIR (MVR) using Memo 4D 32 MM Mitral Valve Ring.;  Surgeon: Rexene Alberts, MD;  Location: [facility];  Service: Open Heart Surgery;  Laterality: Right;   NECK SURGERY  10/2009   C5-6 fusion   RIGHT/LEFT HEART CATH AND CORONARY ANGIOGRAPHY N/A 01/04/2020   Procedure: RIGHT/LEFT HEART CATH AND CORONARY ANGIOGRAPHY;  Surgeon: Burnell Blanks, MD;  Location: [facility];  Service: Cardiovascular;  Laterality: N/A;   SHOULDER ARTHROSCOPY     spider vein treatment     TEE WITHOUT CARDIOVERSION N/A 06/30/2018   Procedure: TRANSESOPHAGEAL ECHOCARDIOGRAM (TEE);  Surgeon: Sueanne Margarita, MD;  Location[facility];  Service: Cardiovascular;  Laterality: N/A;   TEE WITHOUT CARDIOVERSION N/A 12/12/2019   Procedure: TRANSESOPHAGEAL ECHOCARDIOGRAM (TEE);  Surgeon: Dorothy Spark, MD;  Location: [facility];  Service: Cardiovascular;  Laterality: N/A;   TEE WITHOUT CARDIOVERSION N/A 02/10/2020   Procedure: TRANSESOPHAGEAL ECHOCARDIOGRAM (TEE);  Surgeon: Rexene Alberts, MD;  Location: [facility];  Service: Open Heart Surgery;  Laterality: N/A;   TONSILLECTOMY     uterine fibroids removed      Current Medications: Current Meds  Medication Sig   acetaminophen (TYLENOL) 500 MG tablet Take 500-1,000 mg by mouth See admin instructions. Take 500 mg at night, may take a second 500 -1000 mg dose as needed for pain   aspirin EC 81 MG tablet Take 1 tablet (81 mg total) by mouth daily. Swallow whole.   Biotin 5000 MCG CAPS Take 5,000 mcg by mouth every evening.   Cholecalciferol (VITAMIN D) 2000 units tablet Take 4,000 Units by mouth daily.   Cyanocobalamin (B-12) 5000 MCG CAPS Take 5,000 mcg by mouth once a week. Mondays & Fridays.   esomeprazole (NEXIUM) 40 MG capsule Take 1 capsule (40 mg total) by mouth daily before breakfast.   flecainide (TAMBOCOR) 50 MG tablet Take 1 tablet (50 mg total) by mouth 2 (two) times  daily.   gabapentin (NEURONTIN) 600 MG tablet Take 600 mg by mouth 2 (two) times daily.   ibuprofen (ADVIL) 200 MG tablet Take 400 mg by mouth as needed.   levothyroxine (SYNTHROID) 50 MCG tablet Take 1 tablet (50 mcg total) by mouth daily before breakfast.   metoprolol tartrate (LOPRESSOR) 25 MG tablet Take 0.5 tablets (12.5 mg total) by mouth 2 (two) times daily.   Omega-3 Fatty Acids (FISH OIL) 1000 MG CAPS Take by mouth in the morning and at bedtime.   primidone (MYSOLINE) 50 MG tablet Take 50 mg by mouth at bedtime.   SYSTANE ULTRA 0.4-0.3 % SOLN Place 1 drop into the left eye every 4 (four) hours as needed (dry/irritated eyes. (4-6 times daily)).    Current Facility-Administered Medications for the 08/20/21 encounter (Office Visit) with Freada Bergeron, MD  Medication   bupivacaine (MARCAINE) 0.5 % (with pres) injection 3  mL     Allergies:   Codeine   Social History   Socioeconomic History   Marital status: Married    Spouse name: [name]   Number of children: 2   Years of education: Bachelor   Highest education level: Not on file  Occupational History   Not on file  Tobacco Use   Smoking status: Never   Smokeless tobacco: Never  Vaping Use   Vaping Use: Never used  Substance and Sexual Activity   Alcohol use: Yes    Comment: Socially   Drug use: No   Sexual activity: Yes    Birth control/protection: Post-menopausal  Other Topics Concern   Not on file  Social History Narrative   Patient is married to [place], has 2 children   Patient is right handed   Education level is Bachelor   Caffeine consumption is none other than in chocolate occasionally       Social Determinants of Radio broadcast assistant Strain: Not on file  Food Insecurity: Not on file  Transportation Needs: Not on file  Physical Activity: Not on file  Stress: Not on file  Social Connections: Not on file     Family History: The patient's family history includes Alzheimer's disease in her  mother; Coronary artery disease in her mother; Fibromyalgia in her sister; Heart disease in her maternal grandmother; Hypertension in her mother; Neuropathy in her father; Osteoarthritis in her father; Osteoporosis in her mother; Other in her father; Tremor in her father. There is no history of Colon polyps, Esophageal cancer, Pancreatic cancer, Stomach cancer, Rectal cancer, Ovarian cancer, Breast cancer, Colon cancer, Prostate cancer, or Endometrial cancer.  ROS:   Please see the history of present illness.    Review of Systems  Constitutional:  Negative for chills and fever.  HENT:  Negative for congestion and sore throat.   Eyes:  Negative for blurred vision and pain.  Respiratory:  Negative for cough and shortness of breath.   Cardiovascular:  Positive for palpitations. Negative for chest pain, orthopnea, claudication, leg swelling and PND.  Gastrointestinal:  Negative for heartburn and nausea.  Genitourinary:  Negative for dysuria and hematuria.  Musculoskeletal:  Negative for myalgias and neck pain.  Skin:  Negative for itching and rash.  Neurological:  Negative for dizziness and headaches.  Endo/Heme/Allergies:  Negative for polydipsia. Does not bruise/bleed easily.  Psychiatric/Behavioral:  Negative for memory loss. The patient does not have insomnia.   All other systems reviewed and are negative.  EKGs/Labs/Other Studies Reviewed:    The following studies were reviewed today:  Zio 02/2020: HR of 64 bpm, max HR of 125 bpm, and avg HR of 73 BPM. Few very shors runs of SVT< the longest lasting 5 beats. Ocassional PACs and PVs. No atrial fibrillation or ventricular tachycardia.   Sinus rhythm to sinus tachycardia. Benign heart monitor. Patient's diary correlates with PVCs.  TTE 02/2020: IMPRESSIONS   1. The first postoperative TTE, LVEF 55-60% (previously 60-65%). Since  the last study there is new mitral valve ring with mildly elevated mean  gradient 7 mmHg and only trivial  mitral regurgitation. Normal RVSP.   2. Left ventricular ejection fraction, by estimation, is 55 to 60%. The  left ventricle has normal function. The left ventricle has no regional  wall motion abnormalities. Left ventricular diastolic function could not  be evaluated.   3. Right ventricular systolic function is normal. The right ventricular  size is normal. There is normal pulmonary artery systolic pressure.  The  estimated right ventricular systolic pressure is 85.0 mmHg.   4. The mitral valve has been repaired/replaced. Trivial mitral valve  regurgitation. Mild to moderate mitral stenosis. The mean mitral valve  gradient is 7.0 mmHg with average heart rate of 68 bpm. There is a 32 mm  Memo 4D Mitral Valve Ring present in the   mitral position. Procedure Date: 02/10/2020.   5. Tricuspid valve regurgitation is mild to moderate.   6. The aortic valve is normal in structure. Aortic valve regurgitation is  not visualized. No aortic stenosis is present.   7. The inferior vena cava is normal in size with greater than 50%  respiratory variability, suggesting right atrial pressure of 3 mmHg.   TTE: 05/2019    1. Left ventricular ejection fraction, by visual estimation, is 65 to  70%. The left ventricle has normal function. Normal left ventricular size.  There is no left ventricular hypertrophy.   2. Left ventricular diastolic Doppler parameters are consistent with  impaired relaxation pattern of LV diastolic filling.   3. Global right ventricle has normal systolic function.The right  ventricular size is normal. No increase in right ventricular wall  thickness.   4. Left atrial size was normal.   5. Right atrial size was normal.   6. Moderate to severe mitral valve regurgitation.   7. MV leaflets have myxomatous appearance There is mild prolapse of both  leaflets There is moderate to severe MR.   8. The tricuspid valve is normal in structure. Tricuspid valve  regurgitation is mild.   9. The  aortic valve is tricuspid Aortic valve regurgitation was not  visualized by color flow Doppler. Mild aortic valve sclerosis without  stenosis.  10. The pulmonic valve was normal in structure. Pulmonic valve  regurgitation is mild by color flow Doppler.    ETT on flecanide     Study Highlights      Blood pressure demonstrated a normal response to exercise. No T wave inversion was noted during stress. Upsloping ST segment depression ST segment depression of 1 mm was noted during stress in the II, III, aVF, V6, V5 and V4 leads, and returning to baseline after less than 1 minute of recovery. The patient experienced no angina during the stress test Overall, the patient's exercise capacity was excellent. Duke Treadmill Score: low risk   Negative stress test without evidence of ischemia at given workload. Excellent exercise capacity. Frequent PVC's noted in early recovery.      Echo TEE 06/30/18 - Left ventricle: Systolic function was normal. The estimated   ejection fraction was in the range of 60% to 65%. Wall motion was   normal; there were no regional wall motion abnormalities. - Aortic valve: Trileaflet; mildly thickened leaflets. There was   trivial regurgitation. - Mitral valve: Moderate, holosystolicprolapse, involving the   anterior leaflet and the posterior leaflet. There was moderate to   severe regurgitation. Effective regurgitant orifice (PISA): 0.22   cm^2. Regurgitant volume (PISA): 42 ml. - Left atrium: No evidence of thrombus in the atrial cavity or   appendage. - Right atrium: The atrium was mildly dilated. No evidence of   thrombus in the atrial cavity or appendage. - Tricuspid valve: There was mild regurgitation. - Pulmonic valve: There was trivial regurgitation.  TTE 06/17/18 Myxomatous, moderately thickened valve with bileaflet prolapse.   Mitral regurgitation jet is central and at least moderate to   severe with reversal of forward flow in the pulmonary veins.    However,  left atrial size is normal and so are right sided   pressures. A TEE is recommended for further evaluation.  48 hour Holter 06/09/18   Sinus bradycardia to sinus tachycardia. Infrequent PACs and frequent PVCs (1600 in 48 hrs = 1 % of all beats). SVT runs the longest consisting of 15 beats.   Sinus bradycardia to sinus tachycardia. Infrequent PACs and frequent PVCs (1600 in 48 hrs = 1 % of all beats). SVT runs the longest consisting of 15 beats. Symptoms correlating with PVCs.   EKG:   08/20/21: EKG was not ordered today  Recent Labs: 10/24/2020: Magnesium 2.1; TSH 1.950 07/31/2021: ALT 13; BUN 10; Creatinine, Ser 0.71; Hemoglobin 13.1; Platelets 251.0; Potassium 3.8; Sodium 138  Recent Lipid Panel No results found for: CHOL, TRIG, HDL, CHOLHDL, VLDL, LDLCALC, LDLDIRECT   Risk Assessment/Calculations:    CHA2DS2-VASc Score = 2  {This indicates a 2.2% annual risk of stroke. The patient's score is based upon: CHF History: 0 HTN History: 0 Diabetes History: 0 Stroke History: 0 Vascular Disease History: 0 Age Score: 1 Gender Score: 1          Physical Exam:    VS:  BP 110/76   Pulse 75   Ht 5\' 7"  (1.702 m)   Wt 127 lb 6.4 oz (57.8 kg)   SpO2 97%   BMI 19.95 kg/m     Wt Readings from Last 3 Encounters:  08/20/21 127 lb 6.4 oz (57.8 kg)  07/31/21 130 lb (59 kg)  04/30/21 130 lb (59 kg)     GEN:  Well nourished, well developed in no acute distress HEENT: Normal NECK: No JVD; No carotid bruits CARDIAC: RRR, no murmurs, rubs, gallops RESPIRATORY:  Clear to auscultation without rales, wheezing or rhonchi  ABDOMEN: Soft, non-tender, non-distended MUSCULOSKELETAL:  No edema; No deformity  SKIN: Warm and dry NEUROLOGIC:  Alert and oriented x 3 PSYCHIATRIC:  Normal affect   ASSESSMENT:    No diagnosis found. PLAN:    In order of problems listed above:  #MVP with severe MR s/p minimally inavsive MVR. Underwent minimally invasive mitral valve repair  using Memo 4D 32 MM Mitral Valve Ring by Dr. Roxy Manns on Feb 10, 2020. TTE on 02/2020 with mean mitral  gradient 51mmHg at HR 68bpm. Trivial MR. EF 55-60%. Doing well without HF or anginal symptoms. NYHA class I. -Will continue serial monitoring  #Post-op Paroxysmal Afib: CHADs-vasc 2. Remains in NSR without recurrence. Stopped amio and warfarin. Has been maintained on low dose flec and metop. -Continue metop 12.5mg  BID -Continue flec 50mg  daily  #Symptomatic PVCs/PACs: Overall improved. Has daily palpitations that are short lived and not bothersome. TTE in 02/2020 with normal LVEF. Will continue same medications for now and can up-titrate if symptoms become more bothersome. -Continue flec 50mg  daily and metop 12.5mg  BID as above  #Hyperlipidemia: LDL 125 in 07/2020. Followed by PCP. Wants to avoid statin if possible. No significant disease on pre-op cath. -Repeat cholesterol with PCP -If LDL rising, plan to start zetia vs crestor 5mg  daily (patient hesitant to start statin)          Medication Adjustments/Labs and Tests Ordered: Current medicines are reviewed at length with the patient today.  Concerns regarding medicines are outlined above.  No orders of the defined types were placed in this encounter.  No orders of the defined types were placed in this encounter.   Patient Instructions  Medication Instructions:   Your physician recommends that you continue on your current  medications as directed. Please refer to the Current Medication list given to you today.  *If you need a refill on your cardiac medications before your next appointment, please call your pharmacy*   Follow-Up: At [facility], you and your health needs are our priority.  As part of our continuing mission to provide you with exceptional heart care, we have created designated Provider Care Teams.  These Care Teams include your primary Cardiologist (physician) and Advanced Practice Providers (APPs -  Physician  Assistants and Nurse Practitioners) who all work together to provide you with the care you need, when you need it.  We recommend signing up for the patient portal called "MyChart".  Sign up information is provided on this After Visit Summary.  MyChart is used to connect with patients for Virtual Visits (Telemedicine).  Patients are able to view lab/test results, encounter notes, upcoming appointments, etc.  Non-urgent messages can be sent to your provider as well.   To learn more about what you can do with MyChart, go to [email or web address].    Your next appointment:   1 year(s)  The format for your next appointment:   In Person  Provider:    DR. Reece Leader as a scribe for Freada Bergeron, MD.,have documented all relevant documentation on the behalf of Freada Bergeron, MD,as directed by  Freada Bergeron, MD while in the presence of Freada Bergeron, MD.  I, Freada Bergeron, MD, have reviewed all documentation for this visit. The documentation on 08/20/21 for the exam, diagnosis, procedures, and orders are all accurate and complete.    Signed, Freada Bergeron, MD  08/20/2021 12:35 PM    [place]

## 2021-08-21 ENCOUNTER — Inpatient Hospital Stay: Payer: PPO | Attending: Gynecologic Oncology | Admitting: Gynecologic Oncology

## 2021-08-21 ENCOUNTER — Telehealth: Payer: Self-pay

## 2021-08-21 ENCOUNTER — Encounter: Payer: Self-pay | Admitting: Gynecologic Oncology

## 2021-08-21 VITALS — BP 128/81 | HR 76 | Temp 97.6°F | Resp 16 | Ht 67.0 in | Wt 128.0 lb

## 2021-08-21 DIAGNOSIS — Z7982 Long term (current) use of aspirin: Secondary | ICD-10-CM | POA: Insufficient documentation

## 2021-08-21 DIAGNOSIS — R634 Abnormal weight loss: Secondary | ICD-10-CM | POA: Diagnosis not present

## 2021-08-21 DIAGNOSIS — Z79899 Other long term (current) drug therapy: Secondary | ICD-10-CM | POA: Diagnosis not present

## 2021-08-21 DIAGNOSIS — R110 Nausea: Secondary | ICD-10-CM | POA: Diagnosis not present

## 2021-08-21 DIAGNOSIS — F419 Anxiety disorder, unspecified: Secondary | ICD-10-CM | POA: Insufficient documentation

## 2021-08-21 DIAGNOSIS — R630 Anorexia: Secondary | ICD-10-CM | POA: Diagnosis not present

## 2021-08-21 DIAGNOSIS — Z853 Personal history of malignant neoplasm of breast: Secondary | ICD-10-CM | POA: Diagnosis not present

## 2021-08-21 DIAGNOSIS — G250 Essential tremor: Secondary | ICD-10-CM | POA: Diagnosis not present

## 2021-08-21 DIAGNOSIS — E039 Hypothyroidism, unspecified: Secondary | ICD-10-CM | POA: Diagnosis not present

## 2021-08-21 DIAGNOSIS — I341 Nonrheumatic mitral (valve) prolapse: Secondary | ICD-10-CM | POA: Insufficient documentation

## 2021-08-21 DIAGNOSIS — I340 Nonrheumatic mitral (valve) insufficiency: Secondary | ICD-10-CM | POA: Diagnosis not present

## 2021-08-21 DIAGNOSIS — N83201 Unspecified ovarian cyst, right side: Secondary | ICD-10-CM | POA: Diagnosis not present

## 2021-08-21 NOTE — Progress Notes (Signed)
Gynecologic Oncology Return Clinic Visit  08/21/2021  Reason for Visit: Follow-up in the setting of slowly growing cystic adnexal mass  Treatment History: Seen initially at the request of Dr. Gaetano Net for evaluation of an adnexal mass.  Patient reports that last year, as preoperative work-up before surgery for mitral valve repair, she had an abdominal and pelvic CT.  On that CT scan, there were cysts a cyst seen on one of her ovaries.  She has been followed with serial imaging since with her GYN, and presents today for discussion of treatment options.  Overall she reports doing very well.  She endorses a good appetite without any early satiety, bloating, nausea, or emesis.  She denies any changes to her bowel or bladder function.  She denies any abdominal or pelvic pain.  She has some neuropathy, back, and leg pain, with for which she is currently undergoing work-up.   Patient history is notable for estrogen receptor positive breast cancer, treated a number of years ago.  She was on antiestrogen therapy with tamoxifen until approximately 2019.  She denies having genetic testing related to this diagnosis.    The patient's most recent [facility] on 2/7, shows uterus measuring 7.3 x 2.8 x 4.2 cm with a lining of 3.8 mm.  Right ovary measures 5.4 x 5.3 x 5.8 cm, left ovary measures 3.3 x 1.4 x 2.2 cm.  There are multiple simple cystic areas noted within the right ovary and a simple appearing 1.7 cm cyst within the left ovary.  No free fluid noted.  There is a 2.2 x 2.4 cm uterine fibroid also noted.  Received additional records from gyn office:   Pelvic ultrasound 04/19/20: Uterus with 3.1x2.8cm fundal fibroid, lining 2.37mm. Right ovary with multiple simple appearing cysts (4.8x3.9cm, 2.4x1.3cm, 1.9cm). Left ovary with simple appearing cyst (1.3x1.1cm).   Pelvic ultrasound from 01/19/20: Uterus with 3.6cm fundal fibroid. Lining is 3.53mm. Right ovary is 5.6x4x5cm  with multiple simple cysts and possible elongated cystic tubular structure concerning for hydrosalpinx. Left ovary with 1.5x1.2cm simple cyst.    Pelvic ultrasound 07/12/2009: Uterus with 2.7cm fibroid, lining is 3.8mm. Left ovary with 65mm simple cyst, right with 95mm and 61mm simple cysts.    CA-125: 11.9 on 11/28/20  Interval History: Since her last visit with me, the patient reports about 5 minutes of chronic nausea as well as associated weight loss due to decreased appetite.  She notes that nausea is almost daily, worse some days than others, and worse during some parts of the day than others.  She denies any emesis and has been able to eat fairly normally.  Secondary to decreased appetite due to nausea, she has lost 7 or 8 pounds in the last 5 months.  She denies any abdominal bloating or early satiety.  She denies any abdominal or pelvic pain.  She reports regular bowel function, denies any changes to urination.  Past Medical/Surgical History: Past Medical History:  Diagnosis Date   Anxiety disorder    Arthritis    Breast cancer ([place])    s/p lumpectomy   Cataract    Dense breast 09/06/2013   Dysrhythmia    PACs   Essential tremor    /familial   Heart murmur    corrected with valve surgery   Hypothyroidism    Interstitial cystitis    Malignant neoplasm of lower-outer quadrant of right breast of female, estrogen receptor positive ([place]) 02/11/2012   Mitral valve disorder 05/08/2009   Qualifier:  Diagnosis of  By: [name], RN, [name]     MVP (mitral valve prolapse)    Neuropathy    Nonrheumatic mitral valve regurgitation    PAC (premature atrial contraction)    Palpitations    Paresthesias 07/29/2014   Personal history of breast cancer 09/06/2013   PREMATURE ATRIAL CONTRACTIONS 05/08/2009   Qualifier: Diagnosis of  By: [name], RN, [name]     Radiculopathy, cervical region    RLS (restless legs syndrome)    S/P minimally-invasive mitral valve repair 02/10/2020   Complex valvuloplasty  including artificial Gore-tex neochord placement x8 with edge-to-edge suture plication of anterior commissure and 32 mm [name] 4D ring annuloplasty via right mini thoracotomy approach   Thyroid disease    Tremor 12/08/2013   Varicose veins of right lower extremity with complications 4/40/3474    Past Surgical History:  Procedure Laterality Date   bilateral cataract repair  july/aug 2021   Dr Tommy Rainwater   BREAST BIOPSY  2009   BREAST LUMPECTOMY     CARDIAC CATHETERIZATION     DILATION AND CURETTAGE OF UTERUS     EYE SURGERY Left    Pterygium   HEMANGIOMA EXCISION Left 1990   arm   MELANOMA EXCISION Right 1980   thigh   MITRAL VALVE REPAIR Right 02/10/2020   Procedure: MINIMALLY INVASIVE MITRAL VALVE REPAIR (MVR) using Memo 4D 32 MM Mitral Valve Ring.;  Surgeon: Rexene Alberts, MD;  Location: [facility];  Service: Open Heart Surgery;  Laterality: Right;   NECK SURGERY  10/2009   C5-6 fusion   RIGHT/LEFT HEART CATH AND CORONARY ANGIOGRAPHY N/A 01/04/2020   Procedure: RIGHT/LEFT HEART CATH AND CORONARY ANGIOGRAPHY;  Surgeon: Burnell Blanks, MD;  Location: [facility];  Service: Cardiovascular;  Laterality: N/A;   SHOULDER ARTHROSCOPY     spider vein treatment     TEE WITHOUT CARDIOVERSION N/A 06/30/2018   Procedure: TRANSESOPHAGEAL ECHOCARDIOGRAM (TEE);  Surgeon: Sueanne Margarita, MD;  Location[facility];  Service: Cardiovascular;  Laterality: N/A;   TEE WITHOUT CARDIOVERSION N/A 12/12/2019   Procedure: TRANSESOPHAGEAL ECHOCARDIOGRAM (TEE);  Surgeon: Dorothy Spark, MD;  Location: [facility];  Service: Cardiovascular;  Laterality: N/A;   TEE WITHOUT CARDIOVERSION N/A 02/10/2020   Procedure: TRANSESOPHAGEAL ECHOCARDIOGRAM (TEE);  Surgeon: Rexene Alberts, MD;  Location: [facility];  Service: Open Heart Surgery;  Laterality: N/A;   TONSILLECTOMY     uterine fibroids removed      Family History  Problem Relation Age of Onset   Coronary artery disease Mother        s/p MI  (66s)   Hypertension Mother    Osteoporosis Mother        wrist fracture   Alzheimer's disease Mother    Osteoarthritis Father    Neuropathy Father    Tremor Father    Other Father        lymphedema   Fibromyalgia Sister    Heart disease Maternal Grandmother    Colon polyps Neg Hx    Esophageal cancer Neg Hx    Pancreatic cancer Neg Hx    Stomach cancer Neg Hx    Rectal cancer Neg Hx    Ovarian cancer Neg Hx    Breast cancer Neg Hx    Colon cancer Neg Hx    Prostate cancer Neg Hx    Endometrial cancer Neg Hx     Social History   Socioeconomic History   Marital status: Married    Spouse name: [name]  Number of children: 2   Years of education: Bachelor   Highest education level: Not on file  Occupational History   Not on file  Tobacco Use   Smoking status: Never   Smokeless tobacco: Never  Vaping Use   Vaping Use: Never used  Substance and Sexual Activity   Alcohol use: Yes    Comment: Socially   Drug use: No   Sexual activity: Yes    Birth control/protection: Post-menopausal  Other Topics Concern   Not on file  Social History Narrative   Patient is married to [name], has 2 children   Patient is right handed   Education level is Bachelor   Caffeine consumption is none other than in chocolate occasionally       Social Determinants of Radio broadcast assistant Strain: Not on file  Food Insecurity: Not on file  Transportation Needs: Not on file  Physical Activity: Not on file  Stress: Not on file  Social Connections: Not on file    Current Medications:  Current Outpatient Medications:    acetaminophen (TYLENOL) 500 MG tablet, Take 500-1,000 mg by mouth See admin instructions. Take 500 mg at night, may take a second 500 -1000 mg dose as needed for pain, Disp: , Rfl:    aspirin EC 81 MG tablet, Take 1 tablet (81 mg total) by mouth daily. Swallow whole., Disp: 90 tablet, Rfl: 3   Biotin 5000 MCG CAPS, Take 5,000 mcg by mouth every evening., Disp: , Rfl:     Cholecalciferol (VITAMIN D) 2000 units tablet, Take 4,000 Units by mouth daily., Disp: , Rfl:    Cyanocobalamin (B-12) 5000 MCG CAPS, Take 5,000 mcg by mouth once a week. Mondays & Fridays., Disp: , Rfl:    flecainide (TAMBOCOR) 50 MG tablet, Take 1 tablet (50 mg total) by mouth 2 (two) times daily., Disp: 180 tablet, Rfl: 1   gabapentin (NEURONTIN) 600 MG tablet, Take 600 mg by mouth 2 (two) times daily., Disp: , Rfl:    ibuprofen (ADVIL) 200 MG tablet, Take 400 mg by mouth as needed., Disp: , Rfl:    levothyroxine (SYNTHROID) 50 MCG tablet, Take 1 tablet (50 mcg total) by mouth daily before breakfast., Disp: , Rfl:    metoprolol tartrate (LOPRESSOR) 25 MG tablet, Take 0.5 tablets (12.5 mg total) by mouth 2 (two) times daily., Disp: 90 tablet, Rfl: 3   Omega-3 Fatty Acids (FISH OIL) 1000 MG CAPS, Take by mouth in the morning and at bedtime., Disp: , Rfl:    primidone (MYSOLINE) 50 MG tablet, Take 50 mg by mouth at bedtime., Disp: , Rfl:    SYSTANE ULTRA 0.4-0.3 % SOLN, Place 1 drop into the left eye every 4 (four) hours as needed (dry/irritated eyes. (4-6 times daily)). , Disp: , Rfl:    esomeprazole (NEXIUM) 40 MG capsule, Take 1 capsule (40 mg total) by mouth daily before breakfast. (Patient not taking: Reported on 08/20/2021), Disp: 90 capsule, Rfl: 0  Current Facility-Administered Medications:    bupivacaine (MARCAINE) 0.5 % (with pres) injection 3 mL, 3 mL, Other, Once, Magnus Sinning, MD  Review of Systems: Pertinent positives as per HPI. Denies appetite changes, fevers, chills, fatigue. Denies hearing loss, neck lumps or masses, mouth sores, ringing in ears or voice changes. Denies cough or wheezing.  Denies shortness of breath. Denies chest pain or palpitations. Denies leg swelling. Denies abdominal distention, pain, blood in stools, constipation, diarrhea, vomiting, or early satiety. Denies pain with intercourse, dysuria, frequency, hematuria or incontinence.  Denies hot  flashes, pelvic pain, vaginal bleeding or vaginal discharge.   Denies joint pain, back pain or muscle pain/cramps. Denies itching, rash, or wounds. Denies dizziness, headaches, numbness or seizures. Denies swollen lymph nodes or glands, denies easy bruising or bleeding. Denies anxiety, depression, confusion, or decreased concentration.  Physical Exam: BP 128/81 (BP Location: Left Arm, Patient Position: Sitting)   Pulse 76   Temp 97.6 F (36.4 C)   Resp 16   Ht 5\' 7"  (1.702 m)   Wt 128 lb (58.1 kg)   SpO2 100%   BMI 20.05 kg/m  General: Alert, oriented, no acute distress. HEENT: Normocephalic, atraumatic, sclera anicteric. Chest: Unlabored breathing on room air. Abdomen: soft, nontender.  Normoactive bowel sounds.  No masses or hepatosplenomegaly appreciated.   Extremities: Grossly normal range of motion.  Warm, well perfused.  No edema bilaterally. Lymphatics: No cervical, supraclavicular, or inguinal adenopathy. GU: Normal appearing external genitalia without erythema, excoriation, or lesions.  Speculum exam reveals moderately atrophic vaginal mucosa, no lesions or masses.  Bimanual exam reveals small mobile uterus, smooth mass within the cul-de-sac that is mobile, 5-6 cm.  Rectovaginal exam deferred.  Laboratory & Radiologic Studies: CT A/P on 08/08/21: 1. Slowly enlarging cystic appearing right adnexal mass compared with previous CT from 01/09/2020 (and new from 2009). In a postmenopausal patient, this is suspicious for low-grade cystic ovarian neoplasm. Recommend further evaluation with prompt pelvic ultrasound. Note: This recommendation does not apply to premenarchal patients and to those with increased risk (genetic, family history, elevated tumor markers or other high-risk factors) of ovarian cancer. Reference: JACR 2020 Feb; 17(2):248-254 2. No other concerning or acute findings within the abdomen or pelvis. 3. New subpleural plaque at the right lung base, possibly  post inflammatory. 4. These results will be called to the ordering clinician or representative by the Radiologist Assistant, and communication documented in the PACS or [employer].  Assessment & Plan: Kellie Humphrey is a 75 y.o. woman with remote history of breast cancer incidentally found to have simple appearing cystic lesion that has grown very slowly since it was initially seen almost 2 years ago.  Presents today in the setting of small growth noted on last CT scan and 5 months of chronic nausea resulting in unintentional weight loss.  Patient exam continues to be benign.  Discussed most recent CT scan.  I reviewed limitations of CT scan and evaluation of the pelvic organs.  Adnexal mass continues to appear cystic and wall has increased in size, this increases quite small especially over the time period between her last CT scan.  My suspicion is overall extremely low that this is related to malignancy, but given small growth in size, her persistent symptoms this year, and her desire not to need continued surveillance, I think it is reasonable to proceed with diagnostic surgery.  Discussed my recommendation for most minimally invasive surgery necessary.  We will plan for robotic BSO with contained cyst drainage and frozen section.  If no cancer or borderline tumor identified, then no additional surgery would be indicated.  If borderline tumor identified, then patient will also undergo concurrent total hysterectomy.  In the setting of malignancy, we discussed additional procedures to include lymphadenectomy and omentectomy (staging).  Patient would like to wait until after the holidays to have surgery.  We will schedule her for surgery in early January.  She will return closer to the date to have a preoperative visit.   32 minutes of total time was spent for  this patient encounter, including preparation, face-to-face counseling with the patient and coordination of care, and documentation of  the encounter.  Jeral Pinch, MD  Division of Gynecologic Oncology  Department of Obstetrics and Gynecology  [facility]

## 2021-08-21 NOTE — Patient Instructions (Signed)
Please call the office at [phone] when you have decided on a surgery date. Options include Jan 3, 10, 17, 24, or 31, 2023.  We will have you come back to the office closer to the surgery date to meet with Joylene John NP to discuss instructions for before and after surgery. You may receive a phone call from the hospital as well to arrange for a preop appointment at the hospital where they will check labs and go over logistical things such as arrival time, when to stop eating and drinking, what meds to take if any. We usually try to arrange both appointments on the same day if possible.

## 2021-08-21 NOTE — Telephone Encounter (Signed)
Received phone call from Ms. Dutch. Patient would like to proceed with surgery on January 3rd. Instructed patient that our office will work on scheduling this and be in touch with next steps. Patient verbalized understanding.

## 2021-09-05 ENCOUNTER — Other Ambulatory Visit: Payer: Self-pay | Admitting: *Deleted

## 2021-09-05 DIAGNOSIS — I480 Paroxysmal atrial fibrillation: Secondary | ICD-10-CM

## 2021-09-05 DIAGNOSIS — Z9889 Other specified postprocedural states: Secondary | ICD-10-CM

## 2021-09-05 DIAGNOSIS — Z5181 Encounter for therapeutic drug level monitoring: Secondary | ICD-10-CM

## 2021-09-05 MED ORDER — METOPROLOL TARTRATE 25 MG PO TABS: 12.5000 mg | ORAL_TABLET | Freq: Two times a day (BID) | ORAL | 3 refills | Status: DC

## 2021-09-12 ENCOUNTER — Ambulatory Visit
Admission: RE | Admit: 2021-09-12 | Discharge: 2021-09-12 | Disposition: A | Discharge status: Home / Self Care | Payer: PPO | Source: Ambulatory Visit | Attending: Obstetrics & Gynecology | Admitting: Obstetrics & Gynecology

## 2021-09-12 DIAGNOSIS — Z1231 Encounter for screening mammogram for malignant neoplasm of breast: Secondary | ICD-10-CM | POA: Diagnosis not present

## 2021-09-16 NOTE — Patient Instructions (Addendum)
DUE TO COVID-19 ONLY ONE VISITOR IS ALLOWED TO COME WITH YOU AND STAY IN THE WAITING ROOM ONLY DURING PRE OP AND PROCEDURE DAY OF SURGERY IF YOU ARE GOING HOME AFTER SURGERY. IF YOU ARE SPENDING THE NIGHT 2 PEOPLE MAY VISIT WITH YOU IN YOUR PRIVATE ROOM AFTER SURGERY UNTIL VISITING  HOURS ARE OVER AT 800 PM AND 1  VISITOR  MAY  SPEND THE NIGHT.                 Brookelynn Hamor Weygandt     Your procedure is scheduled on: 10/01/21   Report to [facility] Main  Entrance   Report to admitting at  6:15 AM     Call this number if you have problems the morning of surgery [phone]    Follow instruction from the Dr. Gabriel Carina about diet for day before surgery.    Remember: Do not eat food  :After Midnight the night before your surgery,   You may have clear liquids from midnight until ---.  DRINK 2 PRESURGERY ENSURE DRINKS THE NIGHT BEFORE SURGERY AT 10:00 PM .  NO SOLIDS AFTER MIDNIGHT THE DAY PRIOR TO THE SURGERY.   NOTHING BY MOUTH EXCEPT CLEAR LIQUIDS UNTIL 4:30  PLEASE FINISH PRESURGERY ENSURE DRINK PER SURGEON ORDER 3 HOURS PRIOR TO SCHEDULED SURGERY TIME WHICH NEEDS TO BE COMPLETED AT _4:30 am________.   CLEAR LIQUID DIET   Foods Allowed                                                                     Foods Excluded  Coffee and tea, regular and decaf                             liquids that you cannot  Plain Jell-O any favor except red or purple                                           see through such as: Fruit ices (not with fruit pulp)                                     milk, soups, orange juice  Iced Popsicles                                    All solid food Carbonated beverages, regular and diet                                    Cranberry, grape and apple juices Sports drinks like Gatorade Lightly seasoned clear broth or consume(fat free) Sugar     BRUSH YOUR TEETH MORNING OF SURGERY AND RINSE YOUR MOUTH OUT, NO CHEWING GUM CANDY OR MINTS.     Take these  medicines the morning of surgery with A SIP OF WATER: Flecainide, Gabapentin, Metoprolol, Levothyroxine  Stop taking _ASA 81__________on __________as instructed by _____________. t.  Contact your Surgeon/Cardiologist for instructions on Anticoagulant Therapy prior to surgery.                         You may not have any metal on your body including hair pins and              piercings  Do not wear jewelry, make-up, lotions, powders or perfumes, deodorant             Do not wear nail polish on your fingernails.  Do not shave  48 hours prior to surgery.            .   Do not bring valuables to the hospital. [place].  Contacts, dentures or bridgework may not be worn into surgery.     Patients discharged the day of surgery will not be allowed to drive home.  IF YOU ARE HAVING SURGERY AND GOING HOME THE SAME DAY, YOU MUST HAVE AN ADULT TO DRIVE YOU HOME AND BE WITH YOU FOR 24 HOURS. YOU MAY GO HOME BY TAXI OR UBER OR ORTHERWISE, BUT AN ADULT MUST ACCOMPANY YOU HOME AND STAY WITH YOU FOR 24 HOURS.  Name and phone number of your driver:  Special Instructions: N/A              Please read over the following fact sheets you were given: _____________________________________________________________________             [facility] - Preparing for Surgery Before surgery, you can play an important role.  Because skin is not sterile, your skin needs to be as free of germs as possible.  You can reduce the number of germs on your skin by washing with CHG (chlorahexidine gluconate) soap before surgery.  CHG is an antiseptic cleaner which kills germs and bonds with the skin to continue killing germs even after washing. Please DO NOT use if you have an allergy to CHG or antibacterial soaps.  If your skin becomes reddened/irritated stop using the CHG and inform your nurse when you arrive at Short Stay. Do not shave (including legs and underarms)  for at least 48 hours prior to the first CHG shower.  Please follow these instructions carefully:  1.  Shower with CHG Soap the night before surgery and the  morning of Surgery.  2.  If you choose to wash your hair, wash your hair first as usual with your  normal  shampoo.  3.  After you shampoo, rinse your hair and body thoroughly to remove the  shampoo.                            4.  Use CHG as you would any other liquid soap.  You can apply chg directly  to the skin and wash                       Gently with a scrungie or clean washcloth.  5.  Apply the CHG Soap to your body ONLY FROM THE NECK DOWN.   Do not use on face/ open                           Wound or  open sores. Avoid contact with eyes, ears mouth and genitals (private parts).                       Wash face,  Genitals (private parts) with your normal soap.             6.  Wash thoroughly, paying special attention to the area where your surgery  will be performed.  7.  Thoroughly rinse your body with warm water from the neck down.  8.  DO NOT shower/wash with your normal soap after using and rinsing off  the CHG Soap.                9.  Pat yourself dry with a clean towel.            10.  Wear clean pajamas.            11.  Place clean sheets on your bed the night of your first shower and do not  sleep with pets. Day of Surgery : Do not apply any lotions/deodorants the morning of surgery.  Please wear clean clothes to the hospital/surgery center.  FAILURE TO FOLLOW THESE INSTRUCTIONS MAY RESULT IN THE CANCELLATION OF YOUR SURGERY PATIENT SIGNATURE_________________________________  NURSE SIGNATURE__________________________________  ________________________________________________________________________   Adam Phenix  An incentive spirometer is a tool that can help keep your lungs clear and active. This tool measures how well you are filling your lungs with each breath. Taking long deep breaths may help reverse or  decrease the chance of developing breathing (pulmonary) problems (especially infection) following: A long period of time when you are unable to move or be active. BEFORE THE PROCEDURE  If the spirometer includes an indicator to show your best effort, your nurse or respiratory therapist will set it to a desired goal. If possible, sit up straight or lean slightly forward. Try not to slouch. Hold the incentive spirometer in an upright position. INSTRUCTIONS FOR USE  Sit on the edge of your bed if possible, or sit up as far as you can in bed or on a chair. Hold the incentive spirometer in an upright position. Breathe out normally. Place the mouthpiece in your mouth and seal your lips tightly around it. Breathe in slowly and as deeply as possible, raising the piston or the ball toward the top of the column. Hold your breath for 3-5 seconds or for as long as possible. Allow the piston or ball to fall to the bottom of the column. Remove the mouthpiece from your mouth and breathe out normally. Rest for a few seconds and repeat Steps 1 through 7 at least 10 times every 1-2 hours when you are awake. Take your time and take a few normal breaths between deep breaths. The spirometer may include an indicator to show your best effort. Use the indicator as a goal to work toward during each repetition. After each set of 10 deep breaths, practice coughing to be sure your lungs are clear. If you have an incision (the cut made at the time of surgery), support your incision when coughing by placing a pillow or rolled up towels firmly against it. Once you are able to get out of bed, walk around indoors and cough well. You may stop using the incentive spirometer when instructed by your caregiver.  RISKS AND COMPLICATIONS Take your time so you do not get dizzy or light-headed. If you are in pain, you may need to take or ask for pain  medication before doing incentive spirometry. It is harder to take a deep breath if you  are having pain. AFTER USE Rest and breathe slowly and easily. It can be helpful to keep track of a log of your progress. Your caregiver can provide you with a simple table to help with this. If you are using the spirometer at home, follow these instructions: [place] IF:  You are having difficultly using the spirometer. You have trouble using the spirometer as often as instructed. Your pain medication is not giving enough relief while using the spirometer. You develop fever of 100.5 F (38.1 C) or higher. SEEK IMMEDIATE MEDICAL CARE IF:  You cough up bloody sputum that had not been present before. You develop fever of 102 F (38.9 C) or greater. You develop worsening pain at or near the incision site. MAKE SURE YOU:  Understand these instructions. Will watch your condition. Will get help right away if you are not doing well or get worse. Document Released: 01/26/2007 Document Revised: 12/08/2011 Document Reviewed: 03/29/2007 [facility] Patient Information 2014 [place], [place].   ________________________________________________________________________

## 2021-09-18 ENCOUNTER — Encounter (HOSPITAL_COMMUNITY): Payer: Self-pay

## 2021-09-18 ENCOUNTER — Encounter (HOSPITAL_COMMUNITY)
Admission: RE | Admit: 2021-09-18 | Discharge: 2021-09-18 | Disposition: A | Discharge status: Home / Self Care | Payer: PPO | Source: Ambulatory Visit | Attending: Gynecologic Oncology | Admitting: Gynecologic Oncology

## 2021-09-18 ENCOUNTER — Other Ambulatory Visit: Payer: Self-pay

## 2021-09-18 DIAGNOSIS — Z952 Presence of prosthetic heart valve: Secondary | ICD-10-CM | POA: Insufficient documentation

## 2021-09-18 DIAGNOSIS — I491 Atrial premature depolarization: Secondary | ICD-10-CM | POA: Insufficient documentation

## 2021-09-18 DIAGNOSIS — Z01812 Encounter for preprocedural laboratory examination: Secondary | ICD-10-CM | POA: Insufficient documentation

## 2021-09-18 DIAGNOSIS — Z853 Personal history of malignant neoplasm of breast: Secondary | ICD-10-CM | POA: Diagnosis not present

## 2021-09-18 DIAGNOSIS — I9789 Other postprocedural complications and disorders of the circulatory system, not elsewhere classified: Secondary | ICD-10-CM | POA: Insufficient documentation

## 2021-09-18 DIAGNOSIS — E039 Hypothyroidism, unspecified: Secondary | ICD-10-CM | POA: Diagnosis not present

## 2021-09-18 DIAGNOSIS — N9489 Other specified conditions associated with female genital organs and menstrual cycle: Secondary | ICD-10-CM | POA: Insufficient documentation

## 2021-09-18 LAB — COMPREHENSIVE METABOLIC PANEL
ALT: 25 U/L (ref 0–44)
AST: 30 U/L (ref 15–41)
Albumin: 4.1 g/dL (ref 3.5–5.0)
Alkaline Phosphatase: 80 U/L (ref 38–126)
Anion gap: 7 (ref 5–15)
BUN: 11 mg/dL (ref 8–23)
CO2: 28 mmol/L (ref 22–32)
Calcium: 9.1 mg/dL (ref 8.9–10.3)
Chloride: 105 mmol/L (ref 98–111)
Creatinine, Ser: 0.69 mg/dL (ref 0.44–1.00)
GFR, Estimated: >60 mL/min (ref >60)
Glucose, Bld: 78 mg/dL (ref 70–99)
Potassium: 3.9 mmol/L (ref 3.5–5.1)
Sodium: 140 mmol/L (ref 135–145)
Total Bilirubin: 0.7 mg/dL (ref 0.3–1.2)
Total Protein: 7.4 g/dL (ref 6.5–8.1)

## 2021-09-18 LAB — CBC
HCT: 41.2 % (ref 36.0–46.0)
Hemoglobin: 13.6 g/dL (ref 12.0–15.0)
MCH: 30.2 pg (ref 26.0–34.0)
MCHC: 33 g/dL (ref 30.0–36.0)
MCV: 91.4 fL (ref 80.0–100.0)
Platelets: 239 10*3/uL (ref 150–400)
RBC: 4.51 MIL/uL (ref 3.87–5.11)
RDW: 14 % (ref 11.5–15.5)
WBC: 3.9 10*3/uL — ABNORMAL LOW (ref 4.0–10.5)
nRBC: 0 % (ref 0.0–0.2)

## 2021-09-18 LAB — TYPE AND SCREEN
ABO/RH(D): A POS
Antibody Screen: NEGATIVE

## 2021-09-18 NOTE — Progress Notes (Signed)
COVID test- NA  PCP - Dr. Johny Sax Cardiologist - Dr. Johney Frame  Chest x-ray - 09/13/21-epic EKG - 11/06/20-epic Stress Test - 2019 ECHO - 03/26/20-epic Cardiac Cath - 2021, MV repair 2021 Pacemaker/ICD device last checked:NA  Sleep Study - no CPAP -   Fasting Blood Sugar - NA Checks Blood Sugar _____ times a day  Blood Thinner Instructions:ASA 81mg / Dr. Johney Frame Aspirin Instructions:none. Pt will call the Dr. About ASA, fish oil and post op pain meds Last Dose:  Anesthesia review: yes  Patient denies shortness of breath, fever, cough and chest pain at PAT appointment Pt has no SOB with any activities  Patient verbalized understanding of instructions that were given to them at the PAT appointment. Patient was also instructed that they will need to review over the PAT instructions again at home before surgery. yes

## 2021-09-19 ENCOUNTER — Encounter: Payer: Self-pay | Admitting: Cardiology

## 2021-09-20 ENCOUNTER — Telehealth: Payer: Self-pay | Admitting: *Deleted

## 2021-09-20 ENCOUNTER — Telehealth: Payer: Self-pay

## 2021-09-20 ENCOUNTER — Encounter: Payer: Self-pay | Admitting: Gynecologic Oncology

## 2021-09-20 NOTE — Telephone Encounter (Addendum)
° °  Pre-operative Risk Assessment    Patient Name: Kellie Humphrey  DOB: 1946/05/08 MRN: [id number]   Request for Surgical Clearance{ Procedure:  XI ROBOTIC ASSISTED BILATERAL SALPINGO OOPHORECTOMY, possible hysterectomy   Date of Surgery:  Clearance 10/01/21                                Surgeon:  Jeral Pinch, MD Surgeon's Group or Practice Name:  [facility]  Phone number:  [phone] Fax number:  [phone]  Type of Clearance Requested:   - Pharmacy:  Hold Aspirin and Fish oil  Type of Anesthesia:  General   Additional requests/questions:      Oneal Grout   09/20/2021, 1:26 PM

## 2021-09-20 NOTE — Telephone Encounter (Signed)
° °  Primary Cardiologist: Freada Bergeron, MD  Kellie Humphrey was last seen by Dr. Johney Frame on 08/20/21. She currently takes aspirin but has no documented history of CAD. She has a history of mitral valve repair and PAF, not on anticoagulation.  Request to hold aspirin and fish oil for bilateral salpingo oophorectomy possible hysterectomy for surgery on 09/30/21.   Will route to Dr. Johney Frame for advice regarding holding aspirin.   Emmaline Life, NP-C    09/20/2021, 1:42 PM [place] [address], [address] Office [phone] Fax [phone]

## 2021-09-20 NOTE — Telephone Encounter (Signed)
Dr. Jacolyn Reedy office (cardiology) requested to do surgical clearance for this patient, form faxed to office, [phone].  Fax confirmation received.

## 2021-09-20 NOTE — Telephone Encounter (Signed)
PC to patient, informed her M. Cross NP will be entering her pre-op & post-op instructions in MyChart for her to review.  NP also wants to arrange phone visit since patient is going to be out of town next week.  Patient informed NP can call her the afternoon of 09/25/21 to discuss instructions, patient verbalizes understanding.

## 2021-09-20 NOTE — Progress Notes (Signed)
Anesthesia Chart Review   Case: [id number] Date/Time: 10/01/21 0715   Procedures:      XI ROBOTIC ASSISTED BILATERAL SALPINGO OOPHORECTOMY (Bilateral)     XI ROBOTIC ASSISTED TOTAL HYSTERECTOMY;POSSIBLE STAGING;POSSIBLE LAPAROTOMY   Anesthesia type: General   Pre-op diagnosis: ADNEXAL MASS   Location: [facility] ROOM 03 / [facility]   Surgeons: Lafonda Mosses, MD       DISCUSSION:75 y.o. never smoker with s/p mitral valve replacement 02/10/20, post op a-fib without recurrence, hypothyroidism, PACs, breast cancer s/p lumpectomy, adnexal mass scheduled for above procedure 10/01/20 with Dr. Jeral Pinch.   Pt last seen by cardiology 08/20/2021. Per OV note pt walking 3 miles daily with no exertional symptoms.   Anticipate pt can proceed with planned procedure barring acute status change.   VS: BP 99/72    Pulse 71    Temp 36.7 C (Oral)    Resp 18    Ht 5\' 7"  (1.702 m)    Wt 59 kg    SpO2 96%    BMI 20.36 kg/m   PROVIDERS: Ginger Organ., MD is PCP   Gwyndolyn Kaufman, MD is Cardiologist  LABS: Labs reviewed: Acceptable for surgery. (all labs ordered are listed, but only abnormal results are displayed)  Labs Reviewed  CBC - Abnormal; Notable for the following components:      Result Value   WBC 3.9 (*)    All other components within normal limits  COMPREHENSIVE METABOLIC PANEL  TYPE AND SCREEN     IMAGES:   EKG: 11/06/2020 Rate 73 bpm  SR  CV: Echo 03/26/2020  1. The first postoperative TTE, LVEF 55-60% (previously 60-65%). Since  the last study there is new mitral valve ring with mildly elevated mean  gradient 7 mmHg and only trivial mitral regurgitation. Normal RVSP.   2. Left ventricular ejection fraction, by estimation, is 55 to 60%. The  left ventricle has normal function. The left ventricle has no regional  wall motion abnormalities. Left ventricular diastolic function could not  be evaluated.   3. Right ventricular systolic function is normal. The right  ventricular  size is normal. There is normal pulmonary artery systolic pressure. The  estimated right ventricular systolic pressure is 39.7 mmHg.   4. The mitral valve has been repaired/replaced. Trivial mitral valve  regurgitation. Mild to moderate mitral stenosis. The mean mitral valve  gradient is 7.0 mmHg with average heart rate of 68 bpm. There is a 32 mm  Memo 4D Mitral Valve Ring present in the   mitral position. Procedure Date: 02/10/2020.   5. Tricuspid valve regurgitation is mild to moderate.   6. The aortic valve is normal in structure. Aortic valve regurgitation is  not visualized. No aortic stenosis is present.   7. The inferior vena cava is normal in size with greater than 50%  respiratory variability, suggesting right atrial pressure of 3 mmHg. Past Medical History:  Diagnosis Date   Anxiety disorder    Arthritis    Cataract    Essential tremor    /familial   Heart murmur 2021   corrected with valve surgery   Hypothyroidism    Interstitial cystitis    Malignant neoplasm of lower-outer quadrant of right breast of female, estrogen receptor positive ([place]) 02/11/2012   Neuropathy    Nonrheumatic mitral valve regurgitation    PAC (premature atrial contraction)    Paresthesias 07/29/2014   PREMATURE ATRIAL CONTRACTIONS 05/08/2009   Qualifier: Diagnosis of  By: [name], RN, [name]  Radiculopathy, cervical region    RLS (restless legs syndrome)    S/P minimally-invasive mitral valve repair 02/10/2020   Complex valvuloplasty including artificial Gore-tex neochord placement x8 with edge-to-edge suture plication of anterior commissure and 32 mm [name] 4D ring annuloplasty via right mini thoracotomy approach   Thyroid disease    Varicose veins of right lower extremity with complications 83/41/9622    Past Surgical History:  Procedure Laterality Date   bilateral cataract repair  july/aug 2021   Dr Tommy Rainwater   BREAST BIOPSY  2009   BREAST LUMPECTOMY  2010   CARDIAC  CATHETERIZATION  2021   DILATION AND CURETTAGE OF UTERUS     age 59   EYE SURGERY Left    Pterygium   HEMANGIOMA EXCISION Left 1990   arm   MELANOMA EXCISION Right 1980   thigh   MITRAL VALVE REPAIR Right 02/10/2020   Procedure: MINIMALLY INVASIVE MITRAL VALVE REPAIR (MVR) using Memo 4D 32 MM Mitral Valve Ring.;  Surgeon: Rexene Alberts, MD;  Location: [facility];  Service: Open Heart Surgery;  Laterality: Right;   NECK SURGERY  10/2009   C5-6 fusion   RIGHT/LEFT HEART CATH AND CORONARY ANGIOGRAPHY N/A 01/04/2020   Procedure: RIGHT/LEFT HEART CATH AND CORONARY ANGIOGRAPHY;  Surgeon: Burnell Blanks, MD;  Location: [facility];  Service: Cardiovascular;  Laterality: N/A;   SHOULDER ARTHROSCOPY Left 2016   spider vein treatment     TEE WITHOUT CARDIOVERSION N/A 06/30/2018   Procedure: TRANSESOPHAGEAL ECHOCARDIOGRAM (TEE);  Surgeon: Sueanne Margarita, MD;  Location: [facility] ENDOSCOPY;  Service: Cardiovascular;  Laterality: N/A;   TEE WITHOUT CARDIOVERSION N/A 12/12/2019   Procedure: TRANSESOPHAGEAL ECHOCARDIOGRAM (TEE);  Surgeon: Dorothy Spark, MD;  Location: [facility] ENDOSCOPY;  Service: Cardiovascular;  Laterality: N/A;   TEE WITHOUT CARDIOVERSION N/A 02/10/2020   Procedure: TRANSESOPHAGEAL ECHOCARDIOGRAM (TEE);  Surgeon: Rexene Alberts, MD;  Location: [facility];  Service: Open Heart Surgery;  Laterality: N/A;   TONSILLECTOMY     as a child   uterine fibroids removed  2012    MEDICATIONS:  acetaminophen (TYLENOL) 500 MG tablet   aspirin EC 81 MG tablet   Biotin 5000 MCG CAPS   Cholecalciferol (VITAMIN D) 2000 units tablet   Cyanocobalamin (B-12) 5000 MCG CAPS   esomeprazole (NEXIUM) 40 MG capsule   flecainide (TAMBOCOR) 50 MG tablet   gabapentin (NEURONTIN) 600 MG tablet   levothyroxine (SYNTHROID) 50 MCG tablet   melatonin 5 MG TABS   metoprolol tartrate (LOPRESSOR) 25 MG tablet   Omega-3 Fatty Acids (FISH OIL) 1000 MG CAPS   primidone (MYSOLINE) 50 MG tablet   SYSTANE  ULTRA 0.4-0.3 % SOLN   No current facility-administered medications for this encounter.    Konrad Felix Ward, PA-C [facility] [phone]

## 2021-09-20 NOTE — Anesthesia Preprocedure Evaluation (Addendum)
Anesthesia Evaluation  Patient identified by MRN, date of birth, ID band Patient awake    Reviewed: Allergy & Precautions, NPO status , Patient's Chart, lab work & pertinent test results  Airway Mallampati: II  TM Distance: >3 FB Neck ROM: Full    Dental no notable dental hx.    Pulmonary neg pulmonary ROS,    Pulmonary exam normal breath sounds clear to auscultation       Cardiovascular Normal cardiovascular exam Rhythm:Regular Rate:Normal + Systolic murmurs . The first postoperative TTE, LVEF 55-60% (previously 60-65%). Since  the last study there is new mitral valve ring with mildly elevated mean  gradient 7 mmHg and only trivial mitral regurgitation. Normal RVSP.  2. Left ventricular ejection fraction, by estimation, is 55 to 60%. The  left ventricle has normal function. The left ventricle has no regional  wall motion abnormalities. Left ventricular diastolic function could not  be evaluated.  3. Right ventricular systolic function is normal. The right ventricular  size is normal. There is normal pulmonary artery systolic pressure. The  estimated right ventricular systolic pressure is 23.5 mmHg.  4. The mitral valve has been repaired/replaced. Trivial mitral valve  regurgitation. Mild to moderate mitral stenosis. The mean mitral valve  gradient is 7.0 mmHg with average heart rate of 68 bpm. There is a 32 mm  Memo 4D Mitral Valve Ring present in the  mitral position. Procedure Date: 02/10/2020.  5. Tricuspid valve regurgitation is mild to moderate.  6. The aortic valve is normal in structure. Aortic valve regurgitation is  not visualized. No aortic stenosis is present.  7. The inferior vena cava is normal in size with greater than 50%  respiratory variability, suggesting right atrial pressure of 3 mmHg. Past Medical History:     Neuro/Psych negative neurological ROS  negative psych ROS   GI/Hepatic negative GI  ROS, Neg liver ROS,   Endo/Other  Hypothyroidism   Renal/GU negative Renal ROS  negative genitourinary   Musculoskeletal negative musculoskeletal ROS (+)   Abdominal   Peds negative pediatric ROS (+)  Hematology negative hematology ROS (+)   Anesthesia Other Findings   Reproductive/Obstetrics negative OB ROS                            Anesthesia Physical Anesthesia Plan  ASA: 2  Anesthesia Plan: General   Post-op Pain Management:    Induction: Intravenous  PONV Risk Score and Plan: 3 and Ondansetron, Dexamethasone and Treatment may vary due to age or medical condition  Airway Management Planned: Oral ETT  Additional Equipment:   Intra-op Plan:   Post-operative Plan: Extubation in OR  Informed Consent: I have reviewed the patients History and Physical, chart, labs and discussed the procedure including the risks, benefits and alternatives for the proposed anesthesia with the patient or authorized representative who has indicated his/her understanding and acceptance.     Dental advisory given  Plan Discussed with: CRNA and Surgeon  Anesthesia Plan Comments: (See PAT note 09/18/21, Konrad Felix Ward, PA-C)       Anesthesia Quick Evaluation

## 2021-09-22 NOTE — Telephone Encounter (Signed)
Okay to hold the aspirin for the procedure.

## 2021-09-24 NOTE — Telephone Encounter (Signed)
° °  Primary Cardiologist: Freada Bergeron, MD  Chart reviewed as part of pre-operative protocol coverage. Given past medical history and time since last visit, based on ACC/AHA guidelines, Emmamae Mcnamara would be at acceptable risk for the planned procedure without further cardiovascular testing.   RCRI indicates class II risk, 0.9% risk of major cardiac event.  Per Dr. Johney Frame, ok to hold Aspirin for 5-7 days prior to surgery. Please resume Aspirin as soon as hemostasis achieved, at the discretion of the surgeon.   I will route this recommendation to the requesting party via Epic fax function and remove from pre-op pool.  Please call with questions.  Lenna Sciara, NP 09/24/2021, 8:40 AM

## 2021-09-25 ENCOUNTER — Other Ambulatory Visit: Payer: Self-pay | Admitting: Gynecologic Oncology

## 2021-09-25 ENCOUNTER — Telehealth: Payer: Self-pay | Admitting: Gynecologic Oncology

## 2021-09-25 DIAGNOSIS — N83201 Unspecified ovarian cyst, right side: Secondary | ICD-10-CM

## 2021-09-25 MED ORDER — SENNOSIDES-DOCUSATE SODIUM 8.6-50 MG PO TABS: 2.0000 | ORAL_TABLET | Freq: Every day | ORAL | 0 refills | Status: DC

## 2021-09-25 MED ORDER — TRAMADOL HCL 50 MG PO TABS: 50.0000 mg | ORAL_TABLET | Freq: Four times a day (QID) | ORAL | 0 refills | Status: DC | PRN

## 2021-09-25 NOTE — Telephone Encounter (Signed)
Pre-operative instructions sent to patient in mychart for upcoming surgery discussed in detail. Post-op meds prescribed. She is to call for any needs or concerns.

## 2021-09-27 ENCOUNTER — Telehealth: Payer: Self-pay

## 2021-09-27 NOTE — Telephone Encounter (Signed)
Telephone call to [name] to check on pre-operative status.  Patient compliant with pre-operative instructions.  Reinforced nothing to eat after midnight. Clear liquids until 0430. Patient to arrive at [place].  No questions or concerns voiced.  Instructed to call for any needs.

## 2021-10-01 ENCOUNTER — Ambulatory Visit (HOSPITAL_COMMUNITY): Payer: PPO | Admitting: Physician Assistant

## 2021-10-01 ENCOUNTER — Ambulatory Visit (HOSPITAL_COMMUNITY)
Admission: RE | Admit: 2021-10-01 | Discharge: 2021-10-01 | Disposition: A | Discharge status: Home / Self Care | Payer: PPO | Source: Ambulatory Visit | Attending: Gynecologic Oncology | Admitting: Gynecologic Oncology

## 2021-10-01 ENCOUNTER — Ambulatory Visit (HOSPITAL_COMMUNITY): Payer: PPO | Admitting: Registered Nurse

## 2021-10-01 ENCOUNTER — Other Ambulatory Visit: Payer: Self-pay

## 2021-10-01 ENCOUNTER — Encounter (HOSPITAL_COMMUNITY)
Admission: RE | Disposition: A | Discharge status: Home / Self Care | Payer: Self-pay | Source: Ambulatory Visit | Attending: Gynecologic Oncology

## 2021-10-01 ENCOUNTER — Encounter (HOSPITAL_COMMUNITY): Payer: Self-pay | Admitting: Gynecologic Oncology

## 2021-10-01 DIAGNOSIS — D270 Benign neoplasm of right ovary: Secondary | ICD-10-CM | POA: Diagnosis not present

## 2021-10-01 DIAGNOSIS — K6389 Other specified diseases of intestine: Secondary | ICD-10-CM | POA: Diagnosis not present

## 2021-10-01 DIAGNOSIS — Z853 Personal history of malignant neoplasm of breast: Secondary | ICD-10-CM | POA: Diagnosis not present

## 2021-10-01 DIAGNOSIS — E039 Hypothyroidism, unspecified: Secondary | ICD-10-CM | POA: Diagnosis not present

## 2021-10-01 DIAGNOSIS — I071 Rheumatic tricuspid insufficiency: Secondary | ICD-10-CM | POA: Insufficient documentation

## 2021-10-01 DIAGNOSIS — D271 Benign neoplasm of left ovary: Secondary | ICD-10-CM

## 2021-10-01 DIAGNOSIS — N83201 Unspecified ovarian cyst, right side: Secondary | ICD-10-CM | POA: Diagnosis not present

## 2021-10-01 DIAGNOSIS — N83202 Unspecified ovarian cyst, left side: Secondary | ICD-10-CM | POA: Diagnosis not present

## 2021-10-01 DIAGNOSIS — R1909 Other intra-abdominal and pelvic swelling, mass and lump: Secondary | ICD-10-CM | POA: Diagnosis not present

## 2021-10-01 DIAGNOSIS — N9489 Other specified conditions associated with female genital organs and menstrual cycle: Secondary | ICD-10-CM | POA: Diagnosis not present

## 2021-10-01 DIAGNOSIS — N838 Other noninflammatory disorders of ovary, fallopian tube and broad ligament: Secondary | ICD-10-CM | POA: Diagnosis not present

## 2021-10-01 HISTORY — PX: ROBOTIC ASSISTED BILATERAL SALPINGO OOPHERECTOMY: SHX6078

## 2021-10-01 SURGERY — SALPINGO-OOPHORECTOMY, BILATERAL, ROBOT-ASSISTED
Anesthesia: General | Laterality: Bilateral

## 2021-10-01 MED ORDER — SUGAMMADEX SODIUM 200 MG/2ML IV SOLN
INTRAVENOUS | Status: DC | PRN
  Administered 2021-10-01: 130 mg via INTRAVENOUS

## 2021-10-01 MED ORDER — MIDAZOLAM HCL 5 MG/5ML IJ SOLN
INTRAMUSCULAR | Status: DC | PRN
  Administered 2021-10-01 (×2): 1 mg via INTRAVENOUS

## 2021-10-01 MED ORDER — BUPIVACAINE HCL 0.25 % IJ SOLN
INTRAMUSCULAR | Status: AC
  Filled 2021-10-01: qty 1

## 2021-10-01 MED ORDER — ENSURE PRE-SURGERY PO LIQD: 296.0000 mL | Freq: Once | ORAL | Status: DC

## 2021-10-01 MED ORDER — HYDROMORPHONE HCL 1 MG/ML IJ SOLN
INTRAMUSCULAR | Status: AC
  Filled 2021-10-01: qty 1

## 2021-10-01 MED ORDER — KETOROLAC TROMETHAMINE 30 MG/ML IJ SOLN: 15.0000 mg | Freq: Once | INTRAMUSCULAR | Status: DC | PRN

## 2021-10-01 MED ORDER — FENTANYL CITRATE (PF) 100 MCG/2ML IJ SOLN
INTRAMUSCULAR | Status: DC | PRN
  Administered 2021-10-01: 25 ug via INTRAVENOUS
  Administered 2021-10-01: 50 ug via INTRAVENOUS

## 2021-10-01 MED ORDER — BUPIVACAINE HCL 0.25 % IJ SOLN
INTRAMUSCULAR | Status: DC | PRN
  Administered 2021-10-01: 32 mL

## 2021-10-01 MED ORDER — CHLORHEXIDINE GLUCONATE 0.12 % MT SOLN
15.0000 mL | Freq: Once | OROMUCOSAL | Status: AC
  Administered 2021-10-01: 15 mL via OROMUCOSAL

## 2021-10-01 MED ORDER — PHENYLEPHRINE HCL (PRESSORS) 10 MG/ML IV SOLN
INTRAVENOUS | Status: AC
  Filled 2021-10-01: qty 1

## 2021-10-01 MED ORDER — PROPOFOL 10 MG/ML IV BOLUS
INTRAVENOUS | Status: AC
  Filled 2021-10-01: qty 20

## 2021-10-01 MED ORDER — LACTATED RINGERS IR SOLN
Status: DC | PRN
  Administered 2021-10-01: 1000 mL

## 2021-10-01 MED ORDER — ONDANSETRON HCL 4 MG/2ML IJ SOLN
INTRAMUSCULAR | Status: AC
  Filled 2021-10-01: qty 2

## 2021-10-01 MED ORDER — STERILE WATER FOR IRRIGATION IR SOLN
Status: DC | PRN
  Administered 2021-10-01: 1000 mL

## 2021-10-01 MED ORDER — LACTATED RINGERS IV SOLN: INTRAVENOUS | Status: DC

## 2021-10-01 MED ORDER — ORAL CARE MOUTH RINSE: 15.0000 mL | Freq: Once | OROMUCOSAL | Status: AC

## 2021-10-01 MED ORDER — LIDOCAINE HCL (PF) 2 % IJ SOLN
INTRAMUSCULAR | Status: AC
  Filled 2021-10-01: qty 5

## 2021-10-01 MED ORDER — EPHEDRINE SULFATE-NACL 50-0.9 MG/10ML-% IV SOSY
PREFILLED_SYRINGE | INTRAVENOUS | Status: DC | PRN
  Administered 2021-10-01 (×3): 5 mg via INTRAVENOUS

## 2021-10-01 MED ORDER — DEXAMETHASONE SODIUM PHOSPHATE 4 MG/ML IJ SOLN: 4.0000 mg | INTRAMUSCULAR | Status: DC

## 2021-10-01 MED ORDER — DEXAMETHASONE SODIUM PHOSPHATE 10 MG/ML IJ SOLN
INTRAMUSCULAR | Status: AC
  Filled 2021-10-01: qty 1

## 2021-10-01 MED ORDER — LACTATED RINGERS IV SOLN: Freq: Once | INTRAVENOUS | Status: DC

## 2021-10-01 MED ORDER — HEPARIN SODIUM (PORCINE) 5000 UNIT/ML IJ SOLN
5000.0000 [IU] | INTRAMUSCULAR | Status: AC
  Administered 2021-10-01: 5000 [IU] via SUBCUTANEOUS
  Filled 2021-10-01: qty 1

## 2021-10-01 MED ORDER — FENTANYL CITRATE (PF) 100 MCG/2ML IJ SOLN
INTRAMUSCULAR | Status: AC
  Filled 2021-10-01: qty 2

## 2021-10-01 MED ORDER — ROCURONIUM BROMIDE 10 MG/ML (PF) SYRINGE
PREFILLED_SYRINGE | INTRAVENOUS | Status: AC
  Filled 2021-10-01: qty 10

## 2021-10-01 MED ORDER — LIDOCAINE 2% (20 MG/ML) 5 ML SYRINGE
INTRAMUSCULAR | Status: DC | PRN
  Administered 2021-10-01: 40 mg via INTRAVENOUS
  Administered 2021-10-01: 1.5 mg/kg/h via INTRAVENOUS

## 2021-10-01 MED ORDER — ROCURONIUM BROMIDE 10 MG/ML (PF) SYRINGE
PREFILLED_SYRINGE | INTRAVENOUS | Status: DC | PRN
  Administered 2021-10-01: 70 mg via INTRAVENOUS

## 2021-10-01 MED ORDER — ONDANSETRON HCL 4 MG/2ML IJ SOLN: 4.0000 mg | Freq: Once | INTRAMUSCULAR | Status: DC | PRN

## 2021-10-01 MED ORDER — ACETAMINOPHEN 500 MG PO TABS
1000.0000 mg | ORAL_TABLET | ORAL | Status: AC
  Administered 2021-10-01: 1000 mg via ORAL
  Filled 2021-10-01: qty 2

## 2021-10-01 MED ORDER — HYDROMORPHONE HCL 1 MG/ML IJ SOLN
0.2500 mg | INTRAMUSCULAR | Status: DC | PRN
  Administered 2021-10-01 (×2): 0.5 mg via INTRAVENOUS

## 2021-10-01 MED ORDER — PROPOFOL 10 MG/ML IV BOLUS
INTRAVENOUS | Status: DC | PRN
  Administered 2021-10-01: 110 mg via INTRAVENOUS

## 2021-10-01 MED ORDER — DEXAMETHASONE SODIUM PHOSPHATE 10 MG/ML IJ SOLN
INTRAMUSCULAR | Status: DC | PRN
  Administered 2021-10-01: 8 mg via INTRAVENOUS

## 2021-10-01 MED ORDER — LIDOCAINE HCL 2 % IJ SOLN
INTRAMUSCULAR | Status: AC
  Filled 2021-10-01: qty 20

## 2021-10-01 MED ORDER — ONDANSETRON HCL 4 MG/2ML IJ SOLN
INTRAMUSCULAR | Status: DC | PRN
  Administered 2021-10-01: 4 mg via INTRAVENOUS

## 2021-10-01 MED ORDER — PHENYLEPHRINE HCL-NACL 20-0.9 MG/250ML-% IV SOLN
INTRAVENOUS | Status: DC | PRN
  Administered 2021-10-01: 25 ug/min via INTRAVENOUS

## 2021-10-01 MED ORDER — MIDAZOLAM HCL 2 MG/2ML IJ SOLN
INTRAMUSCULAR | Status: AC
  Filled 2021-10-01: qty 2

## 2021-10-01 SURGICAL SUPPLY — 74 items
ADH SKN CLS APL DERMABOND .7 (GAUZE/BANDAGES/DRESSINGS) ×2
AGENT HMST KT MTR STRL THRMB (HEMOSTASIS)
APL ESCP 34 STRL LF DISP (HEMOSTASIS)
APPLICATOR SURGIFLO ENDO (HEMOSTASIS) IMPLANT
BAG COUNTER SPONGE SURGICOUNT (BAG) IMPLANT
BAG LAPAROSCOPIC 12 15 PORT 16 (BASKET) ×1 IMPLANT
BAG RETRIEVAL 12/15 (BASKET) ×3
BAG SPEC RTRVL LRG 6X4 10 (ENDOMECHANICALS)
BAG SPNG CNTER NS LX DISP (BAG)
BLADE SURG SZ10 CARB STEEL (BLADE) IMPLANT
COVER BACK TABLE 60X90IN (DRAPES) ×3 IMPLANT
COVER TIP SHEARS 8 DVNC (MISCELLANEOUS) ×2 IMPLANT
COVER TIP SHEARS 8MM DA VINCI (MISCELLANEOUS) ×3
DECANTER SPIKE VIAL GLASS SM (MISCELLANEOUS) ×3 IMPLANT
DERMABOND ADVANCED (GAUZE/BANDAGES/DRESSINGS) ×1
DERMABOND ADVANCED .7 DNX12 (GAUZE/BANDAGES/DRESSINGS) ×2 IMPLANT
DRAPE ARM DVNC X/XI (DISPOSABLE) ×8 IMPLANT
DRAPE COLUMN DVNC XI (DISPOSABLE) ×2 IMPLANT
DRAPE DA VINCI XI ARM (DISPOSABLE) ×12
DRAPE DA VINCI XI COLUMN (DISPOSABLE) ×3
DRAPE SHEET LG 3/4 BI-LAMINATE (DRAPES) ×3 IMPLANT
DRAPE SURG IRRIG POUCH 19X23 (DRAPES) ×3 IMPLANT
DRSG OPSITE POSTOP 4X6 (GAUZE/BANDAGES/DRESSINGS) IMPLANT
DRSG OPSITE POSTOP 4X8 (GAUZE/BANDAGES/DRESSINGS) IMPLANT
ELECT PENCIL ROCKER SW 15FT (MISCELLANEOUS) IMPLANT
ELECT REM PT RETURN 15FT ADLT (MISCELLANEOUS) ×3 IMPLANT
GAUZE 4X4 16PLY ~~LOC~~+RFID DBL (SPONGE) ×3 IMPLANT
GLOVE SURG ENC MOIS LTX SZ6 (GLOVE) ×12 IMPLANT
GLOVE SURG ENC MOIS LTX SZ6.5 (GLOVE) ×6 IMPLANT
GOWN STRL REUS W/ TWL LRG LVL3 (GOWN DISPOSABLE) ×8 IMPLANT
GOWN STRL REUS W/TWL LRG LVL3 (GOWN DISPOSABLE) ×12
HOLDER FOLEY CATH W/STRAP (MISCELLANEOUS) IMPLANT
IRRIG SUCT STRYKERFLOW 2 WTIP (MISCELLANEOUS) ×3
IRRIGATION SUCT STRKRFLW 2 WTP (MISCELLANEOUS) ×2 IMPLANT
KIT PROCEDURE DA VINCI SI (MISCELLANEOUS)
KIT PROCEDURE DVNC SI (MISCELLANEOUS) IMPLANT
KIT TURNOVER KIT A (KITS) IMPLANT
MANIPULATOR ADVINCU DEL 3.0 PL (MISCELLANEOUS) IMPLANT
MANIPULATOR ADVINCU DEL 3.5 PL (MISCELLANEOUS) IMPLANT
MANIPULATOR UTERINE 4.5 ZUMI (MISCELLANEOUS) IMPLANT
NDL HYPO 21X1.5 SAFETY (NEEDLE) ×1 IMPLANT
NDL SPNL 18GX3.5 QUINCKE PK (NEEDLE) IMPLANT
NEEDLE HYPO 21X1.5 SAFETY (NEEDLE) ×3 IMPLANT
NEEDLE SPNL 18GX3.5 QUINCKE PK (NEEDLE) IMPLANT
OBTURATOR OPTICAL STANDARD 8MM (TROCAR) ×3
OBTURATOR OPTICAL STND 8 DVNC (TROCAR) ×2
OBTURATOR OPTICALSTD 8 DVNC (TROCAR) ×2 IMPLANT
PACK ROBOT GYN CUSTOM WL (TRAY / TRAY PROCEDURE) ×3 IMPLANT
PAD POSITIONING PINK XL (MISCELLANEOUS) ×3 IMPLANT
PORT ACCESS TROCAR AIRSEAL 12 (TROCAR) ×2 IMPLANT
PORT ACCESS TROCAR AIRSEAL 5M (TROCAR) ×1
POUCH SPECIMEN RETRIEVAL 10MM (ENDOMECHANICALS) IMPLANT
SCRUB EXIDINE 4% CHG 4OZ (MISCELLANEOUS) ×3 IMPLANT
SEAL CANN UNIV 5-8 DVNC XI (MISCELLANEOUS) ×8 IMPLANT
SEAL XI 5MM-8MM UNIVERSAL (MISCELLANEOUS) ×12
SET TRI-LUMEN FLTR TB AIRSEAL (TUBING) ×3 IMPLANT
SPONGE T-LAP 18X18 ~~LOC~~+RFID (SPONGE) IMPLANT
SURGIFLO W/THROMBIN 8M KIT (HEMOSTASIS) IMPLANT
SUT MNCRL AB 4-0 PS2 18 (SUTURE) IMPLANT
SUT PDS AB 1 TP1 96 (SUTURE) IMPLANT
SUT VIC AB 0 CT1 27 (SUTURE)
SUT VIC AB 0 CT1 27XBRD ANTBC (SUTURE) IMPLANT
SUT VIC AB 2-0 CT1 27 (SUTURE)
SUT VIC AB 2-0 CT1 TAPERPNT 27 (SUTURE) IMPLANT
SUT VIC AB 2-0 SH 27 (SUTURE) ×3
SUT VIC AB 2-0 SH 27X BRD (SUTURE) ×1 IMPLANT
SUT VIC AB 4-0 PS2 18 (SUTURE) ×8 IMPLANT
SYR 10ML LL (SYRINGE) IMPLANT
TOWEL OR NON WOVEN STRL DISP B (DISPOSABLE) IMPLANT
TRAP SPECIMEN MUCUS 40CC (MISCELLANEOUS) ×2 IMPLANT
TRAY FOLEY MTR SLVR 16FR STAT (SET/KITS/TRAYS/PACK) ×3 IMPLANT
TROCAR XCEL NON-BLD 5MMX100MML (ENDOMECHANICALS) IMPLANT
UNDERPAD 30X36 HEAVY ABSORB (UNDERPADS AND DIAPERS) ×6 IMPLANT
YANKAUER SUCT BULB TIP 10FT TU (MISCELLANEOUS) IMPLANT

## 2021-10-01 NOTE — Op Note (Signed)
OPERATIVE NOTE  Pre-operative Diagnosis: Adnexal mass  Post-operative Diagnosis: same  Operation: Robotic-assisted laparoscopic bilateral salpingo-oophorectomy, oversew of stomach serosa   Surgeon: Jeral Pinch MD  Assistant Surgeon: Lahoma Crocker MD (an MD assistant was necessary for tissue manipulation, management of robotic instrumentation, retraction and positioning due to the complexity of the case and hospital policies).   Anesthesia: GET  Urine Output: 350cc  Operative Findings: On EUA, small mobile uterus, fullness in the cul de sac. On intra-abdominal entry, significant dilation of stomach noted with minimal serosal defect from trocar on entry (oversewn at end of surgery). Normal upper abdominal survey. Normal omentum, small and large bowel. Minimal ascites. 6 cm normal appearing uterus. 4 cm clear adnexal cyst on right, 1.5 cm cyst on left. Normal appearing fallopian tubes. No intra-abdominal or pelvic evidence of disease.  Frozen section c/w benign cysts bilaterally.  Estimated Blood Loss:  <50cc      Total IV Fluids: see I&O flowsheet         Specimens: bilateral tubes and ovaries, pelvic washings         Complications:  None apparent; patient tolerated the procedure well.         Disposition: PACU - hemodynamically stable.  Procedure Details  The patient was seen in the Holding Room. The risks, benefits, complications, treatment options, and expected outcomes were discussed with the patient.  The patient concurred with the proposed plan, giving informed consent.  The site of surgery properly noted/marked. The patient was identified as Kellie Humphrey and the procedure verified as a Robotic-assisted bilateral salpingo-oophorectomy with any other indicated procedures.   After induction of anesthesia, the patient was draped and prepped in the usual sterile manner. Patient was placed in supine position after anesthesia and draped and prepped in the usual sterile manner  as follows: Her arms were tucked to her side with all appropriate precautions.  The shoulders were stabilized with padded shoulder blocks applied to the acromium processes.  The patient was placed in the semi-lithotomy position in [place].  The perineum and vagina were prepped with CholoraPrep. The patient was draped after the CholoraPrep had been allowed to dry for 3 minutes.  A Time Out was held and the above information confirmed.  The urethra was prepped with Betadine. Foley catheter was placed.   A spongestick was placed in the vagina. OG tube placement was confirmed and to suction.   Next, a 10 mm skin incision was made 1 cm below the subcostal margin in the midclavicular line.  The 5 mm Optiview port and scope was used for direct entry.  Opening pressure was under 10 mm CO2.  The abdomen was insufflated and the findings were noted as above. Given what appeared to be small serosal defect in the mid-portion of the greater curvature of stomach.  At this point and all points during the procedure, the patient's intra-abdominal pressure did not exceed 15 mmHg. Next, an 8 mm skin incision was made superior to the umbilicus and a right and left port were placed about 8 cm lateral to the robot port on the right and left side. The 5 mm assist trocar was exchanged for a 10-12 mm port. All ports were placed under direct visualization.  The patient was placed in steep Trendelenburg.  Bowel was folded away into the upper abdomen.  The robot was docked in the normal manner.  Pelvic washings were collected. The right and left peritoneum were opened parallel to the IP ligament to open  the retroperitoneal spaces bilaterally. The round ligaments were preserved. The ureter was noted to be on the medial leaf of the broad ligament.  The peritoneum above the ureter was incised and stretched and the infundibulopelvic ligament was skeletonized, cauterized and cut.  The utero-ovarian ligament and fallopian tube were  skeletonized, cauterized and transected just lateral to the uterine fundus, freeing the adnexa. Bilateral adnexa placed in an Endocatch bag, drained in a contained manner and removed from the abdomen.   Irrigation was used and excellent hemostasis was achieved.    Attention was then turned to the upper abdomen after the patient was flattened and the robot was redocked for the upper abdomen. The area of the stomach was identified with very small serosal disruption noted. A figure of eight stitch with 2-0 Vicryl was used to oversew this area.   Frozen section returned with benign findings.   At this point in the procedure was completed.  Robotic instruments were removed under direct visulaization.  The robot was undocked. The fascia at the 10-12 mm port was closed with 0 Vicryl on a UR-5 needle.  The subcuticular tissue was closed with 4-0 Vicryl and the skin was closed with 4-0 Monocryl in a subcuticular manner.  Dermabond was applied.    Foley catheter was removed.  All sponge, lap and needle counts were correct x  3.   The patient was transferred to the recovery room in stable condition.  Jeral Pinch, MD

## 2021-10-01 NOTE — H&P (Signed)
GYNECOLOGIC ONCOLOGY H&P  10/01/20  HISTORY OF PRESENT ILLNESS: Kellie Humphrey is a 76 y.o. woman who presents today for robotics BSO.  She continues to struggle with intermittent nausea, denies any further weight loss.  Treatment History: Seen initially at the request of Dr. Gaetano Net for evaluation of an adnexal mass.   Patient reports that last year, as preoperative work-up before surgery for mitral valve repair, she had an abdominal and pelvic CT.  On that CT scan, there were cysts a cyst seen on one of her ovaries.  She has been followed with serial imaging since with her GYN, and presents today for discussion of treatment options.  Overall she reports doing very well.  She endorses a good appetite without any early satiety, bloating, nausea, or emesis.  She denies any changes to her bowel or bladder function.  She denies any abdominal or pelvic pain.  She has some neuropathy, back, and leg pain, with for which she is currently undergoing work-up.   Patient history is notable for estrogen receptor positive breast cancer, treated a number of years ago.  She was on antiestrogen therapy with tamoxifen until approximately 2019.  She denies having genetic testing related to this diagnosis.    The patient's most recent ultrasound [facility] on 2/7, shows uterus measuring 7.3 x 2.8 x 4.2 cm with a lining of 3.8 mm.  Right ovary measures 5.4 x 5.3 x 5.8 cm, left ovary measures 3.3 x 1.4 x 2.2 cm.  There are multiple simple cystic areas noted within the right ovary and a simple appearing 1.7 cm cyst within the left ovary.  No free fluid noted.  There is a 2.2 x 2.4 cm uterine fibroid also noted.   Received additional records from gyn office:   Pelvic ultrasound 04/19/20: Uterus with 3.1x2.8cm fundal fibroid, lining 2.30mm. Right ovary with multiple simple appearing cysts (4.8x3.9cm, 2.4x1.3cm, 1.9cm). Left ovary with simple appearing cyst (1.3x1.1cm).   Pelvic ultrasound  from 01/19/20: Uterus with 3.6cm fundal fibroid. Lining is 3.74mm. Right ovary is 5.6x4x5cm with multiple simple cysts and possible elongated cystic tubular structure concerning for hydrosalpinx. Left ovary with 1.5x1.2cm simple cyst.    Pelvic ultrasound 07/12/2009: Uterus with 2.7cm fibroid, lining is 3.37mm. Left ovary with 17mm simple cyst, right with 60mm and 58mm simple cysts.    CA-125: 11.9 on 11/28/20   PAST MEDICAL HISTORY: Past Medical History:  Diagnosis Date   Anxiety disorder    Arthritis    Cataract    Essential tremor    /familial   Heart murmur 2021   corrected with valve surgery   Hypothyroidism    Interstitial cystitis    Malignant neoplasm of lower-outer quadrant of right breast of female, estrogen receptor positive ([place]) 02/11/2012   Neuropathy    Nonrheumatic mitral valve regurgitation    PAC (premature atrial contraction)    Paresthesias 07/29/2014   PREMATURE ATRIAL CONTRACTIONS 05/08/2009   Qualifier: Diagnosis of  By: [name], RN, [name]     Radiculopathy, cervical region    RLS (restless legs syndrome)    S/P minimally-invasive mitral valve repair 02/10/2020   Complex valvuloplasty including artificial Gore-tex neochord placement x8 with edge-to-edge suture plication of anterior commissure and 32 mm [name] 4D ring annuloplasty via right mini thoracotomy approach   Thyroid disease    Varicose veins of right lower extremity with complications 96/78/9381    PAST SURGICAL HISTORY: Past Surgical History:  Procedure Laterality Date   bilateral cataract  repair  july/aug 2021   Dr Tommy Rainwater   BREAST BIOPSY  2009   BREAST LUMPECTOMY  2010   CARDIAC CATHETERIZATION  2021   DILATION AND CURETTAGE OF UTERUS     age 84   EYE SURGERY Left    Pterygium   HEMANGIOMA EXCISION Left 1990   arm   MELANOMA EXCISION Right 1980   thigh   MITRAL VALVE REPAIR Right 02/10/2020   Procedure: MINIMALLY INVASIVE MITRAL VALVE REPAIR (MVR) using Memo 4D 32 MM Mitral Valve Ring.;   Surgeon: Rexene Alberts, MD;  Location: [facility];  Service: Open Heart Surgery;  Laterality: Right;   NECK SURGERY  10/2009   C5-6 fusion   RIGHT/LEFT HEART CATH AND CORONARY ANGIOGRAPHY N/A 01/04/2020   Procedure: RIGHT/LEFT HEART CATH AND CORONARY ANGIOGRAPHY;  Surgeon: Burnell Blanks, MD;  Location: [facility];  Service: Cardiovascular;  Laterality: N/A;   SHOULDER ARTHROSCOPY Left 2016   spider vein treatment     TEE WITHOUT CARDIOVERSION N/A 06/30/2018   Procedure: TRANSESOPHAGEAL ECHOCARDIOGRAM (TEE);  Surgeon: Sueanne Margarita, MD;  Location: [facility] ENDOSCOPY;  Service: Cardiovascular;  Laterality: N/A;   TEE WITHOUT CARDIOVERSION N/A 12/12/2019   Procedure: TRANSESOPHAGEAL ECHOCARDIOGRAM (TEE);  Surgeon: Dorothy Spark, MD;  Location: [facility] ENDOSCOPY;  Service: Cardiovascular;  Laterality: N/A;   TEE WITHOUT CARDIOVERSION N/A 02/10/2020   Procedure: TRANSESOPHAGEAL ECHOCARDIOGRAM (TEE);  Surgeon: Rexene Alberts, MD;  Location: [facility];  Service: Open Heart Surgery;  Laterality: N/A;   TONSILLECTOMY     as a child   uterine fibroids removed  2012    OB/GYN HISTORY: OB History  Gravida Para Term Preterm AB Living  4 2          SAB IAB Ectopic Multiple Live Births               # Outcome Date GA Lbr Len/2nd Weight Sex Delivery Anes PTL Lv  4 Gravida           3 Gravida           2 Para           1 Para             MEDICATIONS:  Current Facility-Administered Medications:    dexamethasone (DECADRON) injection 4 mg, 4 mg, Intravenous, On Call to OR, Cross, Melissa D, NP   lactated ringers infusion, , Intravenous, Continuous, Barnet Glasgow, MD, Last Rate: 10 mL/hr at 10/01/21 0618, New Bag at 10/01/21 0618  ALLERGIES: Allergies  Allergen Reactions   Benadryl [Diphenhydramine] Other (See Comments)    Long QT   Codeine Nausea And Vomiting   Phenergan [Promethazine] Other (See Comments)    Long QT   Oxycodone Nausea And Vomiting    FAMILY  HISTORY: Family History  Problem Relation Age of Onset   Coronary artery disease Mother        s/p MI (54s)   Hypertension Mother    Osteoporosis Mother        wrist fracture   Alzheimer's disease Mother    Osteoarthritis Father    Neuropathy Father    Tremor Father    Other Father        lymphedema   Fibromyalgia Sister    Heart disease Maternal Grandmother    Colon polyps Neg Hx    Esophageal cancer Neg Hx    Pancreatic cancer Neg Hx    Stomach cancer Neg Hx    Rectal cancer Neg  Hx    Ovarian cancer Neg Hx    Breast cancer Neg Hx    Colon cancer Neg Hx    Prostate cancer Neg Hx    Endometrial cancer Neg Hx     SOCIAL HISTORY: Social History   Socioeconomic History   Marital status: Married    Spouse name: [name]   Number of children: 2   Years of education: Bachelor   Highest education level: Not on file  Occupational History   Not on file  Tobacco Use   Smoking status: Never   Smokeless tobacco: Never  Vaping Use   Vaping Use: Never used  Substance and Sexual Activity   Alcohol use: Yes    Comment: Socially   Drug use: No   Sexual activity: Yes    Birth control/protection: Post-menopausal  Other Topics Concern   Not on file  Social History Narrative   Patient is married to [place], has 2 children   Patient is right handed   Education level is Bachelor   Caffeine consumption is none other than in chocolate occasionally       Social Determinants of Radio broadcast assistant Strain: Not on file  Food Insecurity: Not on file  Transportation Needs: Not on file  Physical Activity: Not on file  Stress: Not on file  Social Connections: Not on file  Intimate Partner Violence: Not on file    REVIEW OF SYSTEMS: Complete 10-system review is negative except for the following: nausea, decreased appetite.  PHYSICAL EXAM: BP 116/83    Pulse 67    Temp 97.6 F (36.4 C) (Oral)    Resp 18    Ht 5\' 7"  (1.702 m)    Wt 130 lb (59 kg)    SpO2 100%    BMI 20.36  kg/m  General: Alert, oriented, no acute distress. HEENT: Normocephalic, atraumatic, sclera anicteric. Chest: Unlabored breathing on room air. Abdomen: soft, nontender.  Normoactive bowel sounds.  No masses or hepatosplenomegaly appreciated.   Extremities: Grossly normal range of motion.  Warm, well perfused.  No edema bilaterally.  LABORATORY AND RADIOLOGIC DATA: CBC    Component Value Date/Time   WBC 3.9 (L) 09/18/2021 1106   RBC 4.51 09/18/2021 1106   HGB 13.6 09/18/2021 1106   HGB 13.8 10/24/2020 1608   HGB 12.7 12/10/2016 1100   HCT 41.2 09/18/2021 1106   HCT 40.8 10/24/2020 1608   HCT 38.1 12/10/2016 1100   PLT 239 09/18/2021 1106   PLT 249 10/24/2020 1608   MCV 91.4 09/18/2021 1106   MCV 91 10/24/2020 1608   MCV 89.4 12/10/2016 1100   MCH 30.2 09/18/2021 1106   MCHC 33.0 09/18/2021 1106   RDW 14.0 09/18/2021 1106   RDW 13.5 10/24/2020 1608   RDW 13.8 12/10/2016 1100   LYMPHSABS 0.9 07/31/2021 1634   LYMPHSABS 1.1 10/24/2020 1608   LYMPHSABS 1.0 12/10/2016 1100   MONOABS 0.6 07/31/2021 1634   MONOABS 0.4 12/10/2016 1100   EOSABS 0.3 07/31/2021 1634   EOSABS 0.2 10/24/2020 1608   BASOSABS 0.0 07/31/2021 1634   BASOSABS 0.0 10/24/2020 1608   BASOSABS 0.0 12/10/2016 1100   BMP Latest Ref Rng & Units 09/18/2021 07/31/2021 10/24/2020  Glucose 70 - 99 mg/dL 78 79 84  BUN 8 - 23 mg/dL 11 10 10   Creatinine 0.44 - 1.00 mg/dL 0.69 0.71 0.77  BUN/Creat Ratio 12 - 28 - - 13  Sodium 135 - 145 mmol/L 140 138 141  Potassium 3.5 - 5.1 mmol/L  3.9 3.8 4.1  Chloride 98 - 111 mmol/L 105 102 102  CO2 22 - 32 mmol/L 28 32 23  Calcium 8.9 - 10.3 mg/dL 9.1 9.3 9.8   ASSESSMENT AND PLAN: Kellie Humphrey is a 76 y.o. woman with remote history of breast cancer incidentally found to have simple appearing cystic lesion that has grown very slowly since it was initially seen almost 2 years ago.  Presents today in the setting of small growth noted on last CT scan and 5 months of chronic  nausea resulting in unintentional weight loss.  Plan for definitive surgery today. Robotic BSO. If borderline pathology, will plan for concurrent hysterectomy. If malignancy encountered will perform staging as well.  Jeral Pinch MD Gynecologic Oncology

## 2021-10-01 NOTE — Addendum Note (Signed)
Addendum  created 10/01/21 1029 by Victoriano Lain, CRNA   Charge Capture section accepted

## 2021-10-01 NOTE — Addendum Note (Signed)
Addendum  created 10/01/21 1031 by Victoriano Lain, CRNA   Charge Capture section accepted

## 2021-10-01 NOTE — Anesthesia Procedure Notes (Signed)
Procedure Name: Intubation Date/Time: 10/01/2021 7:31 AM Performed by: Victoriano Lain, CRNA Pre-anesthesia Checklist: Patient identified, Emergency Drugs available, Suction available, Patient being monitored and Timeout performed Patient Re-evaluated:Patient Re-evaluated prior to induction Oxygen Delivery Method: Circle system utilized Preoxygenation: Pre-oxygenation with 100% oxygen Induction Type: IV induction Ventilation: Mask ventilation without difficulty Laryngoscope Size: Mac and 4 Grade View: Grade II Tube type: Oral Tube size: 7.0 mm Number of attempts: 1 Airway Equipment and Method: Stylet Placement Confirmation: ETT inserted through vocal cords under direct vision, positive ETCO2 and breath sounds checked- equal and bilateral Secured at: 21 cm Tube secured with: Tape Dental Injury: Teeth and Oropharynx as per pre-operative assessment

## 2021-10-01 NOTE — Transfer of Care (Signed)
Immediate Anesthesia Transfer of Care Note  Patient: Kellie Humphrey  Procedure(s) Performed: XI ROBOTIC ASSISTED BILATERAL SALPINGO OOPHORECTOMY (Bilateral)  Patient Location: PACU  Anesthesia Type:General  Level of Consciousness: awake, alert , oriented and patient cooperative  Airway & Oxygen Therapy: Patient Spontanous Breathing and Patient connected to face mask oxygen  Post-op Assessment: Report given to RN, Post -op Vital signs reviewed and stable and Patient moving all extremities  Post vital signs: Reviewed and stable  Last Vitals:  Vitals Value Taken Time  BP 102/69 10/01/21 0904  Temp    Pulse 58 10/01/21 0906  Resp 16 10/01/21 0906  SpO2 100 % 10/01/21 0906  Vitals shown include unvalidated device data.  Last Pain:  Vitals:   10/01/21 0603  TempSrc: Oral  PainSc: 0-No pain         Complications: No notable events documented.

## 2021-10-01 NOTE — Anesthesia Postprocedure Evaluation (Signed)
Anesthesia Post Note  Patient: Kellie Humphrey  Procedure(s) Performed: XI ROBOTIC ASSISTED BILATERAL SALPINGO OOPHORECTOMY (Bilateral)     Patient location during evaluation: PACU Anesthesia Type: General Level of consciousness: awake and alert Pain management: pain level controlled Vital Signs Assessment: post-procedure vital signs reviewed and stable Respiratory status: spontaneous breathing, nonlabored ventilation, respiratory function stable and patient connected to nasal cannula oxygen Cardiovascular status: blood pressure returned to baseline and stable Postop Assessment: no apparent nausea or vomiting Anesthetic complications: no   No notable events documented.  Last Vitals:  Vitals:   10/01/21 0915 10/01/21 0945  BP: 97/67 104/72  Pulse: (!) 58 (!) 59  Resp: 11 12  Temp:    SpO2: 100% 99%    Last Pain:  Vitals:   10/01/21 0945  TempSrc:   PainSc: 5                  [name]

## 2021-10-01 NOTE — Discharge Instructions (Signed)

## 2021-10-02 ENCOUNTER — Encounter (HOSPITAL_COMMUNITY): Payer: Self-pay | Admitting: Gynecologic Oncology

## 2021-10-02 ENCOUNTER — Telehealth: Payer: Self-pay

## 2021-10-02 LAB — SURGICAL PATHOLOGY

## 2021-10-02 NOTE — Telephone Encounter (Signed)
Spoke with Ms. Highland this morning. She states she is eating and drinking well. She did have some discomfort with urination last night " It felt like I was peeing glass." Patient reports drinking water and taking tramadol helped this. Instructed to notify our office if symptoms return. She has not had a BM yet but is passing gas. She is taking senokot as prescribed and encouraged her to drink plenty of water. She denies fever or chills. Incisions are dry and intact. She rates her pain 4/10. Her pain is controlled with alternating ibuprofen and tylenol and tramadol as needed. Patient reports some discomfort from gas, encouraged her to be up and moving throughout the day.     Instructed to call office with any fever, chills, purulent drainage, uncontrolled pain or any other questions or concerns. Patient verbalizes understanding.   Pt aware of post op appointments as well as the office number [phone] and after hours number [phone] to call if she has any questions or concerns

## 2021-10-03 ENCOUNTER — Telehealth: Payer: Self-pay

## 2021-10-03 LAB — CYTOLOGY - NON PAP

## 2021-10-03 NOTE — Telephone Encounter (Signed)
Received call from Ms.Bolyard this afternoon. Patient reports she has spotting from her urethra and pain with urination. She is wearing a panty liner and has a small amount of bright red spotting. She is not soaking through the panty liner. Pain with urination is improving but is still present. She denies urinary frequency, odor, fever or chills, nausea or vomiting. She reports having gas pain from the surgery. Advised patient that movement will help with gas pain and to be up an walking but to listen to her body and rest when needed. She can apply a heating pad or ice pack to abdomen for comfort. Patient reports having a BM. Per Joylene John, NP there might be a small abrasion from the foley catheter used during surgery. Advised patient to use a peri-bottle to dilute urine as she is urinating to help with discomfort and pat dry. Instructed to drink plenty of fluids and monitor symptoms and notify our office with any questions or concerns. Patient verbalized understanding and has office number [phone] as well as after hours number of [phone].

## 2021-10-08 ENCOUNTER — Telehealth: Payer: Self-pay

## 2021-10-08 NOTE — Telephone Encounter (Signed)
Received call from Ms. Schifano this afternoon. Patient endorses continued vaginal bleeding and is concerned with this. She reports light spotting, it does not soak thru her panty liner. Every time she uses the restroom she notices bright red blood when she wipes. She does not notice if spotting increases with activity. She denies pain, burning, odor, fever or chills. She has noticed increased urinary frequency. Appointment scheduled with Joylene John, NP on 10/10/21 at 10:30 am. Patient is in agreement of appointment date and time of appointment.

## 2021-10-09 ENCOUNTER — Telehealth: Payer: Self-pay

## 2021-10-09 ENCOUNTER — Encounter (HOSPITAL_BASED_OUTPATIENT_CLINIC_OR_DEPARTMENT_OTHER): Payer: Self-pay | Admitting: Emergency Medicine

## 2021-10-09 ENCOUNTER — Other Ambulatory Visit: Payer: Self-pay

## 2021-10-09 ENCOUNTER — Emergency Department (HOSPITAL_BASED_OUTPATIENT_CLINIC_OR_DEPARTMENT_OTHER)
Admission: EM | Admit: 2021-10-09 | Discharge: 2021-10-09 | Disposition: A | Discharge status: Home / Self Care | Payer: PPO | Attending: Emergency Medicine | Admitting: Emergency Medicine

## 2021-10-09 DIAGNOSIS — N939 Abnormal uterine and vaginal bleeding, unspecified: Secondary | ICD-10-CM | POA: Insufficient documentation

## 2021-10-09 DIAGNOSIS — Z7982 Long term (current) use of aspirin: Secondary | ICD-10-CM | POA: Diagnosis not present

## 2021-10-09 LAB — CBC
HCT: 40.3 % (ref 36.0–46.0)
Hemoglobin: 13.3 g/dL (ref 12.0–15.0)
MCH: 29.7 pg (ref 26.0–34.0)
MCHC: 33 g/dL (ref 30.0–36.0)
MCV: 90 fL (ref 80.0–100.0)
Platelets: 256 10*3/uL (ref 150–400)
RBC: 4.48 MIL/uL (ref 3.87–5.11)
RDW: 14.2 % (ref 11.5–15.5)
WBC: 6.6 10*3/uL (ref 4.0–10.5)
nRBC: 0 % (ref 0.0–0.2)

## 2021-10-09 LAB — OCCULT BLOOD X 1 CARD TO LAB, STOOL: Fecal Occult Bld: POSITIVE — AB

## 2021-10-09 NOTE — ED Provider Notes (Signed)
[facility] EMERGENCY DEPT Provider Note   CSN: [id number] Arrival date & time: 10/09/21  0043     History  Chief Complaint  Patient presents with   Vaginal Bleeding    Kellie Humphrey is a 76 y.o. female.  The history is provided by the patient and the spouse.  Vaginal Bleeding Quality:  Bright red Severity:  Moderate Timing:  Constant Progression:  Unchanged Chronicity:  New Context comment:  Recent surgery Relieved by:  Nothing Worsened by:  Nothing Associated symptoms: no dizziness and no fever   Patient reports recent gynecologic surgery.  She reports since that time she was in having vaginal spotting that worsened tonight.  She reports it began to worsen night after she had a bowel movement.  Denies any significant straining.  Reports mild pressure in lower abdomen.  No fevers or vomiting.  No weakness. She is not on anticoagulation except for baby aspirin    Home Medications Prior to Admission medications   Medication Sig Start Date End Date Taking? Authorizing Provider  acetaminophen (TYLENOL) 500 MG tablet Take 500-1,000 mg by mouth See admin instructions. Take 500 mg at night, may take a second 500 -1000 mg dose as needed for pain    [provider]  aspirin EC 81 MG tablet Take 1 tablet (81 mg total) by mouth daily. Swallow whole. 07/04/20   Dorothy Spark, MD  Biotin 5000 MCG CAPS Take 5,000 mcg by mouth daily.    [provider]  Cholecalciferol (VITAMIN D) 2000 units tablet Take 4,000 Units by mouth daily.    [provider]  Cyanocobalamin (B-12) 5000 MCG CAPS Take 5,000 mcg by mouth once a week. Mondays    [provider]  esomeprazole (NEXIUM) 40 MG capsule Take 1 capsule (40 mg total) by mouth daily before breakfast. Patient not taking: Reported on 08/20/2021 05/29/21   Gatha Mayer, MD  flecainide (TAMBOCOR) 50 MG tablet Take 1 tablet (50 mg total) by mouth 2 (two) times daily. 04/15/21   Freada Bergeron, MD  gabapentin (NEURONTIN) 600 MG tablet Take 600 mg by mouth 2 (two) times daily.    [provider]  levothyroxine (SYNTHROID) 50 MCG tablet Take 1 tablet (50 mcg total) by mouth daily before breakfast. 11/13/15   Magrinat, Virgie Dad, MD  melatonin 5 MG TABS Take 5 mg by mouth at bedtime.    [provider]  metoprolol tartrate (LOPRESSOR) 25 MG tablet Take 0.5 tablets (12.5 mg total) by mouth 2 (two) times daily. 09/05/21   Freada Bergeron, MD  Omega-3 Fatty Acids (FISH OIL) 1000 MG CAPS Take by mouth in the morning and at bedtime.    [provider]  primidone (MYSOLINE) 50 MG tablet Take 50 mg by mouth at bedtime.    [provider]  senna-docusate (SENOKOT-S) 8.6-50 MG tablet Take 2 tablets by mouth at bedtime. For AFTER surgery, do not take if having diarrhea 09/25/21   Cross, Melissa D, NP  SYSTANE ULTRA 0.4-0.3 % SOLN Place 1 drop into the left eye every 4 (four) hours as needed (dry/irritated eyes. (4-6 times daily)).  12/19/19   [provider]  traMADol (ULTRAM) 50 MG tablet Take 1 tablet (50 mg total) by mouth every 6 (six) hours as needed for severe pain. For AFTER surgery only, do not take and drive 60/73/71   Joylene John D, NP      Allergies    Benadryl [diphenhydramine], Codeine, Phenergan [promethazine], and Oxycodone  Review of Systems   Review of Systems  Constitutional:  Negative for fever.  Gastrointestinal:  Negative for blood in stool and vomiting.  Genitourinary:  Positive for vaginal bleeding.  Neurological:  Negative for dizziness.  All other systems reviewed and are negative.  Physical Exam Updated Vital Signs BP 124/80    Pulse 70    Temp 98.1 F (36.7 C)    Resp 15    Wt 59 kg    SpO2 100%    BMI 20.36 kg/m  Physical Exam CONSTITUTIONAL: Elderly, no acute distress HEAD: Normocephalic/atraumatic EYES: EOMI/PERRL, conjunctiva pink ENMT: Mucous membranes moist NECK: supple no meningeal  signs SPINE/BACK:entire spine nontender CV: S1/S2 noted, no murmurs/rubs/gallops noted LUNGS: Lungs are clear to auscultation bilaterally, no apparent distress ABDOMEN: soft, nontender, no rebound or guarding, bowel sounds noted throughout abdomen Well-healed incisions are noted GU:no cva tenderness Small amount of blood noted on the external genitalia.  There are no lacerations or bruising noted. There is clot formation noted in the vaginal vault.  No active bleeding is noted.  Nurse chaperone present Rectal - no blood/melena noted.  Nurse present for exam NEURO: Pt is awake/alert/appropriate, moves all extremitiesx4.  No facial droop.   EXTREMITIES: pulses normal/equal, full ROM SKIN: warm, color normal PSYCH: no abnormalities of mood noted, alert and oriented to situation  ED Results / Procedures / Treatments   Labs (all labs ordered are listed, but only abnormal results are displayed) Labs Reviewed  OCCULT BLOOD X 1 CARD TO LAB, STOOL - Abnormal; Notable for the following components:      Result Value   Fecal Occult Bld POSITIVE (*)    All other components within normal limits  CBC    EKG None  Radiology No results found.  Procedures Procedures    Medications Ordered in ED Medications - No data to display  ED Course/ Medical Decision Making/ A&P Clinical Course as of 10/09/21 0140  Wed Oct 09, 2021  0130 Discussed the case with Dr. Delsa Sale with gyn-oncology.  We discussed the case entirety and her bleeding.  We also noted that her hemoglobin is very stable.  The risk of worsening hemorrhage is very low given her history of surgery.  She does not recommend any further work-up at this time.  She should keep her appointment on January 10. [DW]    Clinical Course User Index [DW] Ripley Fraise, MD                           Medical Decision Making  This patient presents to the ED for concern of vaginal bleeding, this involves an extensive number of treatment  options, and is a complaint that carries with it a high risk of complications and morbidity.   Comorbidities that complicate the patient evaluation: Patients presentation is complicated by their history of recent gynecologic surgery    Additional history obtained: Additional history obtained from spouse   Lab Tests: I Ordered, and personally interpreted labs.  The pertinent results include: No acute anemia, positive Hemoccult likely due to blood contamination from vaginal bleeding.  No melena or bloody stools noted on rectal exam     Consultations Obtained: I requested consultation with the consultant gynecologist , and discussed  findings as well as pertinent plan - they recommend: She can be safely discharged with follow-up tomorrow  Reevaluation: After the interventions noted above, I reevaluated the patient and found that they have :improved  Complexity of problems  addressed: Patients presentation is most consistent with  acute complicated illness/injury requiring diagnostic workup  Disposition: After consideration of the diagnostic results and the patients response to treatment,  I feel that the patent would benefit from discharge   .  Patient without any significant active bleeding at this time.  Labs are reassuring.  She has no abdominal pain.  She is well-appearing. She does take baby aspirin per her cardiologist and she will continue this. She will follow-up with gynecology tomorrow.  We discussed strict return precautions          Final Clinical Impression(s) / ED Diagnoses Final diagnoses:  Vaginal bleeding    Rx / DC Orders ED Discharge Orders     None         Ripley Fraise, MD 10/09/21 [phone]

## 2021-10-09 NOTE — Discharge Instructions (Addendum)
Please follow-up with your appointment.  As we discussed, if you start to soak through multiple pads per hour of bright red blood, you need to return to the ER immediately

## 2021-10-09 NOTE — Telephone Encounter (Signed)
Following up with Kellie Humphrey this morning after her visit to the ED last night. Patient reports her vaginal bleeding is much lighter today. She is wearing a pad but is not saturating it. She states she had a wonderful experience in the ED and felt assured after her visit. Advised patient to continue to monitor bleeding and let our office know if this increases. Confirmed her appointment with Joylene John, NP for 10:30 am tomorrow. Instructed to call with any needs.

## 2021-10-09 NOTE — ED Triage Notes (Addendum)
Pt recently had surgery 1 week ago to remove ovaries and fallopian tubes. Has been experiencing spotting until tonight, began having increased vaginal bleeding after a BM. Unable to predict pads/hour, has remained on toilet d/t bleeding. Endorses pressure in lower abd.  Denies dizziness, fever, swelling at incision sites.

## 2021-10-09 NOTE — ED Notes (Signed)
EDP at bedside  

## 2021-10-09 NOTE — ED Notes (Signed)
Pt verbalizes understanding of discharge instructions. Opportunity for questioning and answers were provided. Pt discharged from ED to home with husband.    

## 2021-10-10 ENCOUNTER — Other Ambulatory Visit: Payer: Self-pay | Admitting: *Deleted

## 2021-10-10 ENCOUNTER — Inpatient Hospital Stay: Payer: PPO | Attending: Gynecologic Oncology | Admitting: Gynecologic Oncology

## 2021-10-10 VITALS — BP 120/76 | HR 79 | Temp 97.1°F | Resp 16 | Ht 67.0 in | Wt 127.3 lb

## 2021-10-10 DIAGNOSIS — S30814A Abrasion of vagina and vulva, initial encounter: Secondary | ICD-10-CM

## 2021-10-10 DIAGNOSIS — N898 Other specified noninflammatory disorders of vagina: Secondary | ICD-10-CM

## 2021-10-10 MED ORDER — FLECAINIDE ACETATE 50 MG PO TABS: 50.0000 mg | ORAL_TABLET | Freq: Two times a day (BID) | ORAL | 1 refills | Status: DC

## 2021-10-10 MED ORDER — ESTRADIOL 0.1 MG/GM VA CREA: TOPICAL_CREAM | VAGINAL | 1 refills | Status: DC

## 2021-10-10 NOTE — Patient Instructions (Signed)
Today we used a medication called monsels in the vagina to help stop the bleeding. You may have yellowish to brown discharge after this.   We have prescribed vaginal estrogen to be used short term to help with vaginal healing. Use finger tip size amount of estrace inserted into the entrance of the vagina every night at bedtime for 2 weeks, then decrease to twice a week for a total of one month.  Plan to follow up as planned or sooner if needed. Please keep [place] posted on how you are doing and how the bleeding is.

## 2021-10-10 NOTE — Addendum Note (Signed)
Addended by: Joylene John D on: 10/10/2021 04:51 PM   Modules accepted: Level of Service

## 2021-10-10 NOTE — Progress Notes (Signed)
GYN Oncology Symptom Management Visit- Post operative bleeding  Kellie Humphrey is a 76 year old female s/p robotic-assisted laparoscopic bilateral salpingo-oophorectomy, oversew of stomach serosa on 10/01/2021 with Dr. Jeral Pinch for an adnexal mass. Final pathology revealed benign ovarian cysts, serous cystadenomas, bilaterally. After surgery, she had noticed light bright red spotting vaginally. She had been monitoring this and ended up going to the ER on 10/09/2021 for increased bleeding. Her lab work in the ER was stable compared with previous values. She presents to the office for further evaluation. She states she has been doing well since surgery with her only complaint being vaginal bleeding. Tolerating diet with no nausea or emesis reported. Incisions healing well. Bladder and bowels functioning. She is using the sennakot-S.   Exam: Alert, oriented, in no acute distress. Lungs clear. Heart regular in rate and rhythm. Abdominal lap site incisions with resolving mild ecchymosis. No drainage or erythema from the incisions. No lower extremity edema.   No abrasions or lesions noted on the external genitalia. Small amount of blood on the vulva coming from the vagina. On speculum exam, moderate amount of bright red blood with clots noted at the vaginal apex. No obvious laceration requiring repair noted on the exam, no discrete bleeding area. Raw, abraded vaginal apex.  Dr. Delsa Sale to the room as well to assess. With consent, monsels applied to the apex of vagina. Pt advised to rest in the room for the next hour with plans for re-evaluation.  Upon re-evaluation, improvement noted. No active bleeding noted at the apex.   Patient advised to monitor and call for any changes or development of heavier bleeding. She is advised to begin use of estrace vaginal cream at bedtime nightly for 2 weeks then follow up in the office as scheduled or sooner if needed. She is advised to call for any needs or  concerns.

## 2021-10-11 ENCOUNTER — Telehealth: Payer: Self-pay

## 2021-10-11 NOTE — Telephone Encounter (Signed)
Followed up with Kellie Humphrey this morning regarding her vaginal bleeding since last seen in our office 10/10/21. Pt states she is having a little spotting but the bleeding has reduced quite a bit. She states the bleeding is looking better. Pt is using her estrace vaginal cream. Pt instructed to call our office with any questions or if bleeding increases. Pt also has after hours office number as well if needed.

## 2021-10-14 ENCOUNTER — Other Ambulatory Visit: Payer: Self-pay

## 2021-10-14 ENCOUNTER — Encounter: Payer: Self-pay | Admitting: Gynecologic Oncology

## 2021-10-14 ENCOUNTER — Telehealth: Payer: Self-pay

## 2021-10-14 ENCOUNTER — Inpatient Hospital Stay: Payer: PPO | Admitting: Gynecologic Oncology

## 2021-10-14 VITALS — BP 115/64 | HR 76 | Temp 97.6°F | Resp 16 | Ht 67.72 in | Wt 127.0 lb

## 2021-10-14 DIAGNOSIS — Z90722 Acquired absence of ovaries, bilateral: Secondary | ICD-10-CM

## 2021-10-14 DIAGNOSIS — N83201 Unspecified ovarian cyst, right side: Secondary | ICD-10-CM

## 2021-10-14 DIAGNOSIS — D270 Benign neoplasm of right ovary: Secondary | ICD-10-CM

## 2021-10-14 DIAGNOSIS — D271 Benign neoplasm of left ovary: Secondary | ICD-10-CM

## 2021-10-14 DIAGNOSIS — S30814D Abrasion of vagina and vulva, subsequent encounter: Secondary | ICD-10-CM

## 2021-10-14 NOTE — Telephone Encounter (Signed)
Received call from Ms. Leber this morning. Patient states she is unsure of what to expect with vaginal bleeding after coming to the office last week. She reports her bleeding had slowed down but has picked up again. She states she is no longer "dripping". She is changing her panty liner every 2-3 hours. She denies clots. Bleeding is bright red. Denies pain.  Advised patient that Dr. Berline Lopes will be notified and someone from our office will reach out to her and follow up. Patient verbalized understanding.

## 2021-10-14 NOTE — Progress Notes (Signed)
Gynecologic Oncology Return Clinic Visit  10/14/2021  Reason for Visit: vaginal bleeding  Treatment History: 1/3: Robotic BSO, oversew stomach serosa in the setting of an adnexal mass.  Final pathology revealed benign cysts most compatible with serous cystadenoma in both ovaries.  No malignancy.  Interval History: Patient is overall doing well from a postoperative standpoint.  Denies any abdominal or pelvic pain.  Endorses good appetite without nausea or emesis.  Reports some continued constipation and bladder function.  Continues to have vaginal bleeding.  Had improvement in her vaginal bleeding after her visit last week for about a day.  Continues to change her panty liner every 2-3 hours, notes that it is mostly fall although not saturated.  Denies any associated pain or cramping.  Past Medical/Surgical History: Past Medical History:  Diagnosis Date   Anxiety disorder    Arthritis    Cataract    Essential tremor    /familial   Heart murmur 2021   corrected with valve surgery   Hypothyroidism    Interstitial cystitis    Malignant neoplasm of lower-outer quadrant of right breast of female, estrogen receptor positive ([place]) 02/11/2012   Neuropathy    Nonrheumatic mitral valve regurgitation    PAC (premature atrial contraction)    Paresthesias 07/29/2014   PREMATURE ATRIAL CONTRACTIONS 05/08/2009   Qualifier: Diagnosis of  By: [name], RN, [name]     Radiculopathy, cervical region    RLS (restless legs syndrome)    S/P minimally-invasive mitral valve repair 02/10/2020   Complex valvuloplasty including artificial Gore-tex neochord placement x8 with edge-to-edge suture plication of anterior commissure and 32 mm [name] 4D ring annuloplasty via right mini thoracotomy approach   Thyroid disease    Varicose veins of right lower extremity with complications 70/62/3762    Past Surgical History:  Procedure Laterality Date   bilateral cataract repair  july/aug 2021   Dr Tommy Rainwater    BREAST BIOPSY  2009   BREAST LUMPECTOMY  2010   [place]  2021   Advance     age 57   EYE SURGERY Left    Pterygium   HEMANGIOMA EXCISION Left 1990   arm   MELANOMA EXCISION Right 1980   thigh   MITRAL VALVE REPAIR Right 02/10/2020   Procedure: MINIMALLY INVASIVE MITRAL VALVE REPAIR (MVR) using Memo 4D 32 MM Mitral Valve Ring.;  Surgeon: Rexene Alberts, MD;  Location: [facility];  Service: Open Heart Surgery;  Laterality: Right;   NECK SURGERY  10/2009   C5-6 fusion   RIGHT/LEFT HEART CATH AND CORONARY ANGIOGRAPHY N/A 01/04/2020   Procedure: RIGHT/LEFT HEART CATH AND CORONARY ANGIOGRAPHY;  Surgeon: Burnell Blanks, MD;  Location: [facility];  Service: Cardiovascular;  Laterality: N/A;   ROBOTIC ASSISTED BILATERAL SALPINGO OOPHERECTOMY Bilateral 10/01/2021   Procedure: XI ROBOTIC ASSISTED BILATERAL SALPINGO OOPHORECTOMY;  Surgeon: Lafonda Mosses, MD;  Location: [facility];  Service: Gynecology;  Laterality: Bilateral;   SHOULDER ARTHROSCOPY Left 2016   spider vein treatment     TEE WITHOUT CARDIOVERSION N/A 06/30/2018   Procedure: TRANSESOPHAGEAL ECHOCARDIOGRAM (TEE);  Surgeon: Sueanne Margarita, MD;  Location: [facility];  Service: Cardiovascular;  Laterality: N/A;   TEE WITHOUT CARDIOVERSION N/A 12/12/2019   Procedure: TRANSESOPHAGEAL ECHOCARDIOGRAM (TEE);  Surgeon: Dorothy Spark, MD;  Location: [facility];  Service: Cardiovascular;  Laterality: N/A;   TEE WITHOUT CARDIOVERSION N/A 02/10/2020   Procedure: TRANSESOPHAGEAL ECHOCARDIOGRAM (TEE);  Surgeon: Rexene Alberts, MD;  Location:  [facility];  Service: Open Heart Surgery;  Laterality: N/A;   TONSILLECTOMY     as a child   uterine fibroids removed  2012    Family History  Problem Relation Age of Onset   Coronary artery disease Mother        s/p MI (66s)   Hypertension Mother    Osteoporosis Mother        wrist fracture   Alzheimer's disease Mother    Osteoarthritis  Father    Neuropathy Father    Tremor Father    Other Father        lymphedema   Fibromyalgia Sister    Heart disease Maternal Grandmother    Colon polyps Neg Hx    Esophageal cancer Neg Hx    Pancreatic cancer Neg Hx    Stomach cancer Neg Hx    Rectal cancer Neg Hx    Ovarian cancer Neg Hx    Breast cancer Neg Hx    Colon cancer Neg Hx    Prostate cancer Neg Hx    Endometrial cancer Neg Hx     Social History   Socioeconomic History   Marital status: Married    Spouse name: [name]   Number of children: 2   Years of education: Bachelor   Highest education level: Not on file  Occupational History   Not on file  Tobacco Use   Smoking status: Never   Smokeless tobacco: Never  Vaping Use   Vaping Use: Never used  Substance and Sexual Activity   Alcohol use: Yes    Comment: Socially   Drug use: No   Sexual activity: Yes    Birth control/protection: Post-menopausal  Other Topics Concern   Not on file  Social History Narrative   Patient is married to [place], has 2 children   Patient is right handed   Education level is Bachelor   Caffeine consumption is none other than in chocolate occasionally       Social Determinants of Radio broadcast assistant Strain: Not on file  Food Insecurity: Not on file  Transportation Needs: Not on file  Physical Activity: Not on file  Stress: Not on file  Social Connections: Not on file    Current Medications:  Current Outpatient Medications:    acetaminophen (TYLENOL) 500 MG tablet, Take 500-1,000 mg by mouth See admin instructions. Take 500 mg at night, may take a second 500 -1000 mg dose as needed for pain, Disp: , Rfl:    aspirin EC 81 MG tablet, Take 1 tablet (81 mg total) by mouth daily. Swallow whole., Disp: 90 tablet, Rfl: 3   Biotin 5000 MCG CAPS, Take 5,000 mcg by mouth daily., Disp: , Rfl:    Cholecalciferol (VITAMIN D) 2000 units tablet, Take 4,000 Units by mouth daily., Disp: , Rfl:    Cyanocobalamin (B-12) 5000 MCG  CAPS, Take 5,000 mcg by mouth once a week. Mondays, Disp: , Rfl:    esomeprazole (NEXIUM) 40 MG capsule, Take 1 capsule (40 mg total) by mouth daily before breakfast. (Patient not taking: Reported on 08/20/2021), Disp: 90 capsule, Rfl: 0   estradiol (ESTRACE VAGINAL) 0.1 MG/GM vaginal cream, Use finger tip size amount of estrace inserted into the entrance of the vagina every night at bedtime for 2 weeks, then decrease to twice a week for a total of one month., Disp: 42.5 g, Rfl: 1   flecainide (TAMBOCOR) 50 MG tablet, Take 1 tablet (50 mg total) by mouth 2 (two) times  daily., Disp: 180 tablet, Rfl: 1   gabapentin (NEURONTIN) 600 MG tablet, Take 600 mg by mouth 2 (two) times daily., Disp: , Rfl:    levothyroxine (SYNTHROID) 50 MCG tablet, Take 1 tablet (50 mcg total) by mouth daily before breakfast., Disp: , Rfl:    melatonin 5 MG TABS, Take 5 mg by mouth at bedtime., Disp: , Rfl:    metoprolol tartrate (LOPRESSOR) 25 MG tablet, Take 0.5 tablets (12.5 mg total) by mouth 2 (two) times daily., Disp: 90 tablet, Rfl: 3   Omega-3 Fatty Acids (FISH OIL) 1000 MG CAPS, Take by mouth in the morning and at bedtime., Disp: , Rfl:    primidone (MYSOLINE) 50 MG tablet, Take 50 mg by mouth at bedtime., Disp: , Rfl:    senna-docusate (SENOKOT-S) 8.6-50 MG tablet, Take 2 tablets by mouth at bedtime. For AFTER surgery, do not take if having diarrhea, Disp: 30 tablet, Rfl: 0   SYSTANE ULTRA 0.4-0.3 % SOLN, Place 1 drop into the left eye every 4 (four) hours as needed (dry/irritated eyes. (4-6 times daily)). , Disp: , Rfl:    traMADol (ULTRAM) 50 MG tablet, Take 1 tablet (50 mg total) by mouth every 6 (six) hours as needed for severe pain. For AFTER surgery only, do not take and drive, Disp: 10 tablet, Rfl: 0  Review of Systems: Denies appetite changes, fevers, chills, fatigue, unexplained weight changes. Denies hearing loss, neck lumps or masses, mouth sores, ringing in ears or voice changes. Denies cough or wheezing.   Denies shortness of breath. Denies chest pain or palpitations. Denies leg swelling. Denies abdominal distention, pain, blood in stools, constipation, diarrhea, nausea, vomiting, or early satiety. Denies pain with intercourse, dysuria, frequency, hematuria or incontinence. Denies hot flashes, pelvic pain, vaginal bleeding or vaginal discharge.   Denies joint pain, back pain or muscle pain/cramps. Denies itching, rash, or wounds. Denies dizziness, headaches, numbness or seizures. Denies swollen lymph nodes or glands, denies easy bruising or bleeding. Denies anxiety, depression, confusion, or decreased concentration.  Physical Exam: BP 115/64 (BP Location: Left Arm, Patient Position: Sitting)    Pulse 76    Temp 97.6 F (36.4 C) (Tympanic)    Resp 16    Ht 5' 7.72" (1.72 m)    Wt 127 lb (57.6 kg)    SpO2 100%    BMI 19.47 kg/m  General: Alert, oriented, no acute distress. HEENT: Normocephalic, atraumatic, sclera anicteric. Chest: Unlabored breathing on room air. Abdomen: soft, nontender.  Normoactive bowel sounds.  No masses or hepatosplenomegaly appreciated.  Well-healed incisions. GU: Normal appearing external genitalia without erythema, excoriation, or lesions.  Speculum exam reveals there is effect from recent Monsel's and silver nitrate application.  Diffuse abrasions noted to the anterior and posterior vagina at the mid and upper vagina.  No discrete lesions noted.  On bimanual exam, tissue is smooth, no lesions or masses appreciated.  Laboratory & Radiologic Studies: None new  Assessment & Plan: Marguetta Windish is a 76 y.o. woman status post robotic BSO on 1/3 that was uncomplicated now with continued vaginal bleeding postop.  Patient has some abraded surfaces, I suspect this was either from friction caused by prep or from the Betadine itself.  She started using vaginal estrogen at the very end of last week.  I used some silver nitrate to treat some of the bleeding surfaces, no  high-volume bleeding noted.  Discussed continued daily use of vaginal estrogen.  I am scheduled to see her again for routine postoperative visit  next week.  If there is not been some improvement in her vaginal bleeding, we will discuss the need for cauterization in the operating room.  22 minutes of total time was spent for this patient encounter, including preparation, face-to-face counseling with the patient and coordination of care, and documentation of the encounter.  Jeral Pinch, MD  Division of Gynecologic Oncology  Department of Obstetrics and Gynecology  [facility]

## 2021-10-14 NOTE — Telephone Encounter (Signed)
Following up with Kellie Humphrey. Appointment scheduled with Dr. Berline Lopes today at 2:45 pm. Patient is agreeable to appointment time.

## 2021-10-14 NOTE — Patient Instructions (Signed)
Continue using the vaginal estrogen every night and plan to follow up in the office on Monday as scheduled or sooner if needed. Please call the office at [phone] for any questions, concerns, or worsening symptoms.

## 2021-10-16 ENCOUNTER — Telehealth: Payer: Self-pay

## 2021-10-16 NOTE — Telephone Encounter (Signed)
Spoke with Kellie Humphrey this afternoon. Advised patient that Dr. Berline Lopes will see her next week (10/21/21) to reassess her. Instructed her to use the vaginal estrogen nightly until she is seen by Dr. Berline Lopes. Patient verbalized understanding. Instructed to call with any questions or concerns.

## 2021-10-16 NOTE — Telephone Encounter (Signed)
Patient called regarding bleeding after surgery.  Patient was seen on Monday, January 16th by Dr. Berline Lopes. Patient is continuing to bleed through multiple pads daily, the blood is bright red with small clots and she said it is beginning to feel like the way menstrual cramps feel. Patient rated pain 3/10.  Patient stated that if bleeding didn't slow down that Dr. Berline Lopes recommended having another procedure. Told patient I would call her back once I spoke with Dr. Berline Lopes.

## 2021-10-21 ENCOUNTER — Inpatient Hospital Stay (HOSPITAL_BASED_OUTPATIENT_CLINIC_OR_DEPARTMENT_OTHER): Payer: PPO | Admitting: Gynecologic Oncology

## 2021-10-21 ENCOUNTER — Other Ambulatory Visit: Payer: Self-pay

## 2021-10-21 ENCOUNTER — Encounter: Payer: Self-pay | Admitting: Gynecologic Oncology

## 2021-10-21 VITALS — BP 102/61 | HR 80 | Temp 97.5°F | Resp 16 | Ht 67.0 in | Wt 129.0 lb

## 2021-10-21 DIAGNOSIS — N83202 Unspecified ovarian cyst, left side: Secondary | ICD-10-CM

## 2021-10-21 DIAGNOSIS — S30814D Abrasion of vagina and vulva, subsequent encounter: Secondary | ICD-10-CM

## 2021-10-21 NOTE — Progress Notes (Signed)
Gynecologic Oncology Return Clinic Visit  10/21/2021  Reason for Visit: Follow-up vaginal bleeding  Treatment History: 1/3: Robotic BSO, oversew stomach serosa in the setting of an adnexal mass.  Final pathology revealed benign cysts most compatible with serous cystadenoma in both ovaries.  No malignancy.  Interval History: Patient notes that bleeding has almost completely stopped.  She is having some discharge and flakes of what looks like coffee grounds.  Discharge is yellow.  Denies any true bleeding.  Denies any pain.  Reports regular bowel and bladder function.  Has been using vaginal estrogen nightly.  Past Medical/Surgical History: Past Medical History:  Diagnosis Date   Anxiety disorder    Arthritis    Cataract    Essential tremor    /familial   Heart murmur 2021   corrected with valve surgery   Hypothyroidism    Interstitial cystitis    Malignant neoplasm of lower-outer quadrant of right breast of female, estrogen receptor positive ([place]) 02/11/2012   Neuropathy    Nonrheumatic mitral valve regurgitation    PAC (premature atrial contraction)    Paresthesias 07/29/2014   PREMATURE ATRIAL CONTRACTIONS 05/08/2009   Qualifier: Diagnosis of  By: [name], RN, [name]     Radiculopathy, cervical region    RLS (restless legs syndrome)    S/P minimally-invasive mitral valve repair 02/10/2020   Complex valvuloplasty including artificial Gore-tex neochord placement x8 with edge-to-edge suture plication of anterior commissure and 32 mm [name] 4D ring annuloplasty via right mini thoracotomy approach   Thyroid disease    Varicose veins of right lower extremity with complications 24/05/7352    Past Surgical History:  Procedure Laterality Date   bilateral cataract repair  july/aug 2021   Dr Tommy Rainwater   BREAST BIOPSY  2009   BREAST LUMPECTOMY  2010   [place]  2021   [place]     age 29   EYE SURGERY Left    Pterygium   HEMANGIOMA EXCISION  Left 1990   arm   MELANOMA EXCISION Right 1980   thigh   MITRAL VALVE REPAIR Right 02/10/2020   Procedure: MINIMALLY INVASIVE MITRAL VALVE REPAIR (MVR) using Memo 4D 32 MM Mitral Valve Ring.;  Surgeon: Rexene Alberts, MD;  Location: [facility];  Service: Open Heart Surgery;  Laterality: Right;   NECK SURGERY  10/2009   C5-6 fusion   RIGHT/LEFT HEART CATH AND CORONARY ANGIOGRAPHY N/A 01/04/2020   Procedure: RIGHT/LEFT HEART CATH AND CORONARY ANGIOGRAPHY;  Surgeon: Burnell Blanks, MD;  Location: [place] [facility];  Service: Cardiovascular;  Laterality: N/A;   ROBOTIC ASSISTED BILATERAL SALPINGO OOPHERECTOMY Bilateral 10/01/2021   Procedure: XI ROBOTIC ASSISTED BILATERAL SALPINGO OOPHORECTOMY;  Surgeon: Lafonda Mosses, MD;  [facility];  Service: Gynecology;  Laterality: Bilateral;   SHOULDER ARTHROSCOPY Left 2016   spider vein treatment     TEE WITHOUT CARDIOVERSION N/A 06/30/2018   Procedure: TRANSESOPHAGEAL ECHOCARDIOGRAM (TEE);  Surgeon: Sueanne Margarita, MD;  Location: [facility] ENDOSCOPY;  Service: Cardiovascular;  Laterality: N/A;   TEE WITHOUT CARDIOVERSION N/A 12/12/2019   Procedure: TRANSESOPHAGEAL ECHOCARDIOGRAM (TEE);  Surgeon: Dorothy Spark, MD;  Location: [facility]OSCOPY;  Service: Cardiovascular;  Laterality: N/A;   TEE WITHOUT CARDIOVERSION N/A 02/10/2020   Procedure: TRANSESOPHAGEAL ECHOCARDIOGRAM (TEE);  Surgeon: Rexene Alberts, MD;  Location: [place];  Service: Open Heart Surgery;  Laterality: N/A;   TONSILLECTOMY     as a child   uterine fibroids removed  2012  Family History  Problem Relation Age of Onset   Coronary artery disease Mother        s/p MI (59s)   Hypertension Mother    Osteoporosis Mother        wrist fracture   Alzheimer's disease Mother    Osteoarthritis Father    Neuropathy Father    Tremor Father    Other Father        lymphedema   Fibromyalgia Sister    Heart disease Maternal Grandmother    Colon polyps Neg Hx    Esophageal  cancer Neg Hx    Pancreatic cancer Neg Hx    Stomach cancer Neg Hx    Rectal cancer Neg Hx    Ovarian cancer Neg Hx    Breast cancer Neg Hx    Colon cancer Neg Hx    Prostate cancer Neg Hx    Endometrial cancer Neg Hx     Social History   Socioeconomic History   Marital status: Married    Spouse name: [name]   Number of children: 2   Years of education: Bachelor   Highest education level: Not on file  Occupational History   Not on file  Tobacco Use   Smoking status: Never   Smokeless tobacco: Never  Vaping Use   Vaping Use: Never used  Substance and Sexual Activity   Alcohol use: Yes    Comment: Socially   Drug use: No   Sexual activity: Yes    Birth control/protection: Post-menopausal  Other Topics Concern   Not on file  Social History Narrative   Patient is married to [place], has 2 children   Patient is right handed   Education level is Bachelor   Caffeine consumption is none other than in chocolate occasionally       Social Determinants of Radio broadcast assistant Strain: Not on file  Food Insecurity: Not on file  Transportation Needs: Not on file  Physical Activity: Not on file  Stress: Not on file  Social Connections: Not on file    Current Medications:  Current Outpatient Medications:    acetaminophen (TYLENOL) 500 MG tablet, Take 500-1,000 mg by mouth See admin instructions. Take 500 mg at night, may take a second 500 -1000 mg dose as needed for pain, Disp: , Rfl:    aspirin EC 81 MG tablet, Take 1 tablet (81 mg total) by mouth daily. Swallow whole., Disp: 90 tablet, Rfl: 3   Biotin 5000 MCG CAPS, Take 5,000 mcg by mouth daily., Disp: , Rfl:    Cholecalciferol (VITAMIN D) 2000 units tablet, Take 4,000 Units by mouth daily., Disp: , Rfl:    Cyanocobalamin (B-12) 5000 MCG CAPS, Take 5,000 mcg by mouth once a week. Mondays, Disp: , Rfl:    estradiol (ESTRACE VAGINAL) 0.1 MG/GM vaginal cream, Use finger tip size amount of estrace inserted into the  entrance of the vagina every night at bedtime for 2 weeks, then decrease to twice a week for a total of one month., Disp: 42.5 g, Rfl: 1   flecainide (TAMBOCOR) 50 MG tablet, Take 1 tablet (50 mg total) by mouth 2 (two) times daily., Disp: 180 tablet, Rfl: 1   gabapentin (NEURONTIN) 600 MG tablet, Take 600 mg by mouth 2 (two) times daily., Disp: , Rfl:    levothyroxine (SYNTHROID) 50 MCG tablet, Take 1 tablet (50 mcg total) by mouth daily before breakfast., Disp: , Rfl:    melatonin 5 MG TABS, Take 5 mg by mouth  at bedtime., Disp: , Rfl:    metoprolol tartrate (LOPRESSOR) 25 MG tablet, Take 0.5 tablets (12.5 mg total) by mouth 2 (two) times daily., Disp: 90 tablet, Rfl: 3   Omega-3 Fatty Acids (FISH OIL) 1000 MG CAPS, Take by mouth in the morning and at bedtime., Disp: , Rfl:    primidone (MYSOLINE) 50 MG tablet, Take 50 mg by mouth at bedtime., Disp: , Rfl:    SYSTANE ULTRA 0.4-0.3 % SOLN, Place 1 drop into the left eye every 4 (four) hours as needed (dry/irritated eyes. (4-6 times daily)). , Disp: , Rfl:    senna-docusate (SENOKOT-S) 8.6-50 MG tablet, Take 2 tablets by mouth at bedtime. For AFTER surgery, do not take if having diarrhea (Patient not taking: Reported on 10/21/2021), Disp: 30 tablet, Rfl: 0   traMADol (ULTRAM) 50 MG tablet, Take 1 tablet (50 mg total) by mouth every 6 (six) hours as needed for severe pain. For AFTER surgery only, do not take and drive (Patient not taking: Reported on 10/21/2021), Disp: 10 tablet, Rfl: 0  Review of Systems: Pertinent positives as per HPI. Denies appetite changes, fevers, chills, fatigue, unexplained weight changes. Denies hearing loss, neck lumps or masses, mouth sores, ringing in ears or voice changes. Denies cough or wheezing.  Denies shortness of breath. Denies chest pain or palpitations. Denies leg swelling. Denies abdominal distention, pain, blood in stools, constipation, diarrhea, nausea, vomiting, or early satiety. Denies pain with intercourse,  dysuria, frequency, hematuria or incontinence. Denies hot flashes, pelvic pain, vaginal bleeding.   Denies joint pain, back pain or muscle pain/cramps. Denies itching, rash, or wounds. Denies dizziness, headaches, numbness or seizures. Denies swollen lymph nodes or glands, denies easy bruising or bleeding. Denies anxiety, depression, confusion, or decreased concentration.  Physical Exam: BP 102/61 (BP Location: Right Arm, Patient Position: Sitting)    Pulse 80    Temp (!) 97.5 F (36.4 C) (Oral)    Resp 16    Ht 5\' 7"  (1.702 m)    Wt 129 lb (58.5 kg)    SpO2 100%    BMI 20.20 kg/m  General: Alert, oriented, no acute distress. HEENT: Normocephalic, atraumatic, sclera anicteric. Chest: Unlabored breathing on room air. GU: Deferred.  Laboratory & Radiologic Studies: None new  Assessment & Plan: Kellie Humphrey is a 76 y.o. woman status post robotic BSO on 1/3 who developed significant vaginal bleeding postoperatively, now almost resolved on vaginal estrogen.  Patient is doing well now with no true bleeding, some discharge.  Discussed waiting an additional week prior to an exam so as not to cause recurrence of vaginal bleeding.  We will plan to decrease vaginal estrogen to every other night.  Discussed that data does not support increased risk for worsened oncologic outcomes with the use of vaginal estrogen in the setting of her prior history of estrogen receptor positive breast cancer.  28 minutes of total time was spent for this patient encounter, including preparation, face-to-face counseling with the patient and coordination of care, and documentation of the encounter.  Jeral Pinch, MD  Division of Gynecologic Oncology  Department of Obstetrics and Gynecology  [facility]

## 2021-10-21 NOTE — Patient Instructions (Signed)
I am glad to hear that bleeding has basically stopped.  Please decrease the vaginal estrogen use to 3 times a week or every other night.  I will plan to see you next week to perform an exam.  If things look like they have healed well at that time, we will have you stop using the vaginal estrogen.

## 2021-10-25 ENCOUNTER — Telehealth: Payer: Self-pay

## 2021-10-25 NOTE — Telephone Encounter (Signed)
Spoke with Kellie Humphrey this morning regarding her appointment on Monday 10/29/21. Patients appointment is scheduled at 12:45pm but needs to moved to 8:45am. Patient verbalized understanding and is in agreement of new appointment time.

## 2021-10-27 NOTE — Progress Notes (Signed)
Gynecologic Oncology Return Clinic Visit  10/28/21  Reason for Visit: Follow-up vaginal bleeding  Treatment History: 1/3: Robotic BSO, oversew stomach serosa in the setting of an adnexal mass.  Final pathology revealed benign cysts most compatible with serous cystadenoma in both ovaries.  No malignancy  Interval History: Continues to do well.  Reports minimal pink-tinged discharge from time to time.  Denies any bleeding.  Denies any pain or cramping.  Past Medical/Surgical History: Past Medical History:  Diagnosis Date   Anxiety disorder    Arthritis    Cataract    Essential tremor    /familial   Heart murmur 2021   corrected with valve surgery   Hypothyroidism    Interstitial cystitis    Malignant neoplasm of lower-outer quadrant of right breast of female, estrogen receptor positive (Kellie Humphrey) 02/11/2012   Neuropathy    Nonrheumatic mitral valve regurgitation    PAC (premature atrial contraction)    Paresthesias 07/29/2014   PREMATURE ATRIAL CONTRACTIONS 05/08/2009   Qualifier: Diagnosis of  By: Kellie Fillers, RN, Kellie Humphrey     Radiculopathy, cervical region    RLS (restless legs syndrome)    S/P minimally-invasive mitral valve repair 02/10/2020   Complex valvuloplasty including artificial Gore-tex neochord placement x8 with edge-to-edge suture plication of anterior commissure and 32 mm [name] 4D ring annuloplasty via right mini thoracotomy approach   Thyroid disease    Varicose veins of right lower extremity with complications 84/13/2440    Past Surgical History:  Procedure Laterality Date   bilateral cataract repair  july/aug 2021   Dr Kellie Humphrey   BREAST BIOPSY  2009   BREAST LUMPECTOMY  2010   [place]  2021   [place]     age 55   EYE SURGERY Left    Pterygium   HEMANGIOMA EXCISION Left 1990   arm   MELANOMA EXCISION Right 1980   thigh   MITRAL VALVE REPAIR Right 02/10/2020   Procedure: MINIMALLY INVASIVE MITRAL VALVE REPAIR (MVR)  using Memo 4D 32 MM Mitral Valve Ring.;  Surgeon: Kellie Alberts, MD;  Location: [facility];  Service: Open Heart Surgery;  Laterality: Right;   NECK SURGERY  10/2009   C5-6 fusion   RIGHT/LEFT HEART CATH AND CORONARY ANGIOGRAPHY N/A 01/04/2020   Procedure: RIGHT/LEFT HEART CATH AND CORONARY ANGIOGRAPHY;  Surgeon: Kellie Blanks, MD;  Location: [place] [facility];  Service: Cardiovascular;  Laterality: N/A;   ROBOTIC ASSISTED BILATERAL SALPINGO OOPHERECTOMY Bilateral 10/01/2021   Procedure: XI ROBOTIC ASSISTED BILATERAL SALPINGO OOPHORECTOMY;  Surgeon: Kellie Mosses, MD;  [facility];  Service: Gynecology;  Laterality: Bilateral;   SHOULDER ARTHROSCOPY Left 2016   spider vein treatment     TEE WITHOUT CARDIOVERSION N/A 06/30/2018   Procedure: TRANSESOPHAGEAL ECHOCARDIOGRAM (TEE);  Surgeon: Kellie Margarita, MD;  Location: [facility] ENDOSCOPY;  Service: Cardiovascular;  Laterality: N/A;   TEE WITHOUT CARDIOVERSION N/A 12/12/2019   Procedure: TRANSESOPHAGEAL ECHOCARDIOGRAM (TEE);  Surgeon: Kellie Spark, MD;  Location: [facility] ENDOSCOPY;  Service: Cardiovascular;  Laterality: N/A;   TEE WITHOUT CARDIOVERSION N/A 02/10/2020   Procedure: TRANSESOPHAGEAL ECHOCARDIOGRAM (TEE);  Surgeon: Kellie Alberts, MD;  Location: [place];  Service: Open Heart Surgery;  Laterality: N/A;   TONSILLECTOMY     as a child   uterine fibroids removed  2012    Family History  Problem Relation Age of Onset   Coronary artery disease Mother        s/p MI (65s)  Hypertension Mother    Osteoporosis Mother        wrist fracture   Alzheimer's disease Mother    Osteoarthritis Father    Neuropathy Father    Tremor Father    Other Father        lymphedema   Fibromyalgia Sister    Heart disease Maternal Grandmother    Colon polyps Neg Hx    Esophageal cancer Neg Hx    Pancreatic cancer Neg Hx    Stomach cancer Neg Hx    Rectal cancer Neg Hx    Ovarian cancer Neg Hx    Breast cancer Neg Hx    Colon cancer  Neg Hx    Prostate cancer Neg Hx    Endometrial cancer Neg Hx     Social History   Socioeconomic History   Marital status: Married    Spouse name: Kellie Humphrey   Number of children: 2   Years of education: Bachelor   Highest education level: Not on file  Occupational History   Not on file  Tobacco Use   Smoking status: Never   Smokeless tobacco: Never  Vaping Use   Vaping Use: Never used  Substance and Sexual Activity   Alcohol use: Yes    Comment: Socially   Drug use: No   Sexual activity: Yes    Birth control/protection: Post-menopausal  Other Topics Concern   Not on file  Social History Narrative   Patient is married to [place], has 2 children   Patient is right handed   Education level is Bachelor   Caffeine consumption is none other than in chocolate occasionally       Social Determinants of Radio broadcast assistant Strain: Not on file  Food Insecurity: Not on file  Transportation Needs: Not on file  Physical Activity: Not on file  Stress: Not on file  Social Connections: Not on file    Current Medications:  Current Outpatient Medications:    acetaminophen (TYLENOL) 500 MG tablet, Take 500-1,000 mg by mouth See admin instructions. Take 500 mg at night, may take a second 500 -1000 mg dose as needed for pain, Disp: , Rfl:    aspirin EC 81 MG tablet, Take 1 tablet (81 mg total) by mouth daily. Swallow whole., Disp: 90 tablet, Rfl: 3   Biotin 5000 MCG CAPS, Take 5,000 mcg by mouth daily., Disp: , Rfl:    Cholecalciferol (VITAMIN D) 2000 units tablet, Take 4,000 Units by mouth daily., Disp: , Rfl:    Cyanocobalamin (B-12) 5000 MCG CAPS, Take 5,000 mcg by mouth once a week. Mondays, Disp: , Rfl:    estradiol (ESTRACE VAGINAL) 0.1 MG/GM vaginal cream, Use finger tip size amount of estrace inserted into the entrance of the vagina every night at bedtime for 2 weeks, then decrease to twice a week for a total of one month., Disp: 42.5 g, Rfl: 1   flecainide (TAMBOCOR) 50 MG  tablet, Take 1 tablet (50 mg total) by mouth 2 (two) times daily., Disp: 180 tablet, Rfl: 1   gabapentin (NEURONTIN) 600 MG tablet, Take 600 mg by mouth 2 (two) times daily., Disp: , Rfl:    levothyroxine (SYNTHROID) 50 MCG tablet, Take 1 tablet (50 mcg total) by mouth daily before breakfast., Disp: , Rfl:    melatonin 5 MG TABS, Take 5 mg by mouth at bedtime., Disp: , Rfl:    metoprolol tartrate (LOPRESSOR) 25 MG tablet, Take 0.5 tablets (12.5 mg total) by mouth 2 (two) times daily.,  Disp: 90 tablet, Rfl: 3   Omega-3 Fatty Acids (FISH OIL) 1000 MG CAPS, Take by mouth in the morning and at bedtime., Disp: , Rfl:    primidone (MYSOLINE) 50 MG tablet, Take 50 mg by mouth at bedtime., Disp: , Rfl:    SYSTANE ULTRA 0.4-0.3 % SOLN, Place 1 drop into the left eye every 4 (four) hours as needed (dry/irritated eyes. (4-6 times daily)). , Disp: , Rfl:   Review of Systems: Denies appetite changes, fevers, chills, fatigue, unexplained weight changes. Denies hearing loss, neck lumps or masses, mouth sores, ringing in ears or voice changes. Denies cough or wheezing.  Denies shortness of breath. Denies chest pain or palpitations. Denies leg swelling. Denies abdominal distention, pain, blood in stools, constipation, diarrhea, nausea, vomiting, or early satiety. Denies pain with intercourse, dysuria, frequency, hematuria or incontinence. Denies hot flashes, pelvic pain, vaginal bleeding or vaginal discharge.   Denies joint pain, back pain or muscle pain/cramps. Denies itching, rash, or wounds. Denies dizziness, headaches, numbness or seizures. Denies swollen lymph nodes or glands, denies easy bruising or bleeding. Denies anxiety, depression, confusion, or decreased concentration.  Physical Exam: BP 110/72 (BP Location: Left Arm, Patient Position: Sitting)    Pulse 76    Temp 97.7 F (36.5 C) (Tympanic)    Resp 16    Ht 5\' 7"  (1.702 m)    Wt 131 lb 3.2 oz (59.5 kg)    SpO2 100%    BMI 20.55 kg/m  General:  Alert, oriented, no acute distress. HEENT: Normocephalic, atraumatic, sclera anicteric. Chest: Unlabored breathing on room air. Abdomen: soft, nontender.  Normoactive bowel sounds.  No masses or hepatosplenomegaly appreciated.  Well-healed incisions. Extremities: Grossly normal range of motion.  Warm, well perfused.  No edema bilaterally. GU: Normal appearing external genitalia without erythema, excoriation, or lesions.  Speculum exam reveals moderately atrophic vaginal mucosa.  No active bleeding.  Some signs still of recent treatment with silver nitrate.  Bimanual exam reveals vaginal mucosa smooth, no nodularity.   Laboratory & Radiologic Studies: None new  Assessment & Plan: Kellie Humphrey is a 76 y.o. woman who is post robotic BSO on 1/3 who developed significant vaginal bleeding postoperatively, now resolved on vaginal estrogen.    Patient is doing very well and has had cessation of her vaginal bleeding.  I like her to use vaginal estrogen for another few weeks.  She will call if she has any new symptoms.  I will see her in 2 months for a final exam to assure complete healing.  We discussed today that moving forward, it would be safest for [place] to assume that this may have happened at least in part because of an allergy to Betadine.  It is difficult to know if this is a true allergy to Betadine or more related to disruption of the vaginal mucosa during the prep.  22 minutes of total time was spent for this patient encounter, including preparation, face-to-face counseling with the patient and coordination of care, and documentation of the encounter.  Jeral Pinch, MD  Division of Gynecologic Oncology  Department of Obstetrics and Gynecology  [facility]

## 2021-10-28 ENCOUNTER — Ambulatory Visit: Payer: PPO | Admitting: Gynecologic Oncology

## 2021-10-28 ENCOUNTER — Other Ambulatory Visit: Payer: Self-pay

## 2021-10-28 ENCOUNTER — Encounter: Payer: Self-pay | Admitting: Gynecologic Oncology

## 2021-10-28 ENCOUNTER — Inpatient Hospital Stay: Payer: PPO | Admitting: Gynecologic Oncology

## 2021-10-28 VITALS — BP 110/72 | HR 76 | Temp 97.7°F | Resp 16 | Ht 67.0 in | Wt 131.2 lb

## 2021-10-28 DIAGNOSIS — Z90722 Acquired absence of ovaries, bilateral: Secondary | ICD-10-CM

## 2021-10-28 DIAGNOSIS — S30814D Abrasion of vagina and vulva, subsequent encounter: Secondary | ICD-10-CM

## 2021-10-28 DIAGNOSIS — N83201 Unspecified ovarian cyst, right side: Secondary | ICD-10-CM

## 2021-10-28 NOTE — Patient Instructions (Signed)
It was good to see you today.  Things look significantly improved on your exam.  Please continue using the vaginal estrogen 3 times a week at night for another 3-4 weeks.  I will see you in about 2 months to make sure that the vagina has healed completely.  Please call if you need anything before then.

## 2021-10-29 DIAGNOSIS — Z85820 Personal history of malignant melanoma of skin: Secondary | ICD-10-CM | POA: Diagnosis not present

## 2021-10-29 DIAGNOSIS — Z85828 Personal history of other malignant neoplasm of skin: Secondary | ICD-10-CM | POA: Diagnosis not present

## 2021-10-29 DIAGNOSIS — L718 Other rosacea: Secondary | ICD-10-CM | POA: Diagnosis not present

## 2021-11-05 DIAGNOSIS — E039 Hypothyroidism, unspecified: Secondary | ICD-10-CM | POA: Diagnosis not present

## 2021-11-05 DIAGNOSIS — I700 Atherosclerosis of aorta: Secondary | ICD-10-CM | POA: Diagnosis not present

## 2021-11-05 DIAGNOSIS — M859 Disorder of bone density and structure, unspecified: Secondary | ICD-10-CM | POA: Diagnosis not present

## 2021-11-06 DIAGNOSIS — M5412 Radiculopathy, cervical region: Secondary | ICD-10-CM | POA: Diagnosis not present

## 2021-11-06 DIAGNOSIS — G629 Polyneuropathy, unspecified: Secondary | ICD-10-CM | POA: Diagnosis not present

## 2021-11-06 DIAGNOSIS — F419 Anxiety disorder, unspecified: Secondary | ICD-10-CM | POA: Diagnosis not present

## 2021-11-06 DIAGNOSIS — G2581 Restless legs syndrome: Secondary | ICD-10-CM | POA: Diagnosis not present

## 2021-11-06 DIAGNOSIS — Z1331 Encounter for screening for depression: Secondary | ICD-10-CM | POA: Diagnosis not present

## 2021-11-06 DIAGNOSIS — I700 Atherosclerosis of aorta: Secondary | ICD-10-CM | POA: Diagnosis not present

## 2021-11-06 DIAGNOSIS — N3010 Interstitial cystitis (chronic) without hematuria: Secondary | ICD-10-CM | POA: Diagnosis not present

## 2021-11-06 DIAGNOSIS — I341 Nonrheumatic mitral (valve) prolapse: Secondary | ICD-10-CM | POA: Diagnosis not present

## 2021-11-06 DIAGNOSIS — Z853 Personal history of malignant neoplasm of breast: Secondary | ICD-10-CM | POA: Diagnosis not present

## 2021-11-06 DIAGNOSIS — H9311 Tinnitus, right ear: Secondary | ICD-10-CM | POA: Diagnosis not present

## 2021-11-06 DIAGNOSIS — R002 Palpitations: Secondary | ICD-10-CM | POA: Diagnosis not present

## 2021-11-06 DIAGNOSIS — Z0000 Encounter for general adult medical examination without abnormal findings: Secondary | ICD-10-CM | POA: Diagnosis not present

## 2021-11-06 DIAGNOSIS — R82998 Other abnormal findings in urine: Secondary | ICD-10-CM | POA: Diagnosis not present

## 2021-11-06 DIAGNOSIS — G250 Essential tremor: Secondary | ICD-10-CM | POA: Diagnosis not present

## 2021-11-06 DIAGNOSIS — E039 Hypothyroidism, unspecified: Secondary | ICD-10-CM | POA: Diagnosis not present

## 2021-11-06 DIAGNOSIS — I340 Nonrheumatic mitral (valve) insufficiency: Secondary | ICD-10-CM | POA: Diagnosis not present

## 2021-11-06 DIAGNOSIS — M8580 Other specified disorders of bone density and structure, unspecified site: Secondary | ICD-10-CM | POA: Diagnosis not present

## 2021-11-06 DIAGNOSIS — Z1339 Encounter for screening examination for other mental health and behavioral disorders: Secondary | ICD-10-CM | POA: Diagnosis not present

## 2021-11-13 DIAGNOSIS — D2261 Melanocytic nevi of right upper limb, including shoulder: Secondary | ICD-10-CM | POA: Diagnosis not present

## 2021-11-13 DIAGNOSIS — D2262 Melanocytic nevi of left upper limb, including shoulder: Secondary | ICD-10-CM | POA: Diagnosis not present

## 2021-11-13 DIAGNOSIS — Z85828 Personal history of other malignant neoplasm of skin: Secondary | ICD-10-CM | POA: Diagnosis not present

## 2021-11-13 DIAGNOSIS — L718 Other rosacea: Secondary | ICD-10-CM | POA: Diagnosis not present

## 2021-11-13 DIAGNOSIS — L910 Hypertrophic scar: Secondary | ICD-10-CM | POA: Diagnosis not present

## 2021-11-13 DIAGNOSIS — L720 Epidermal cyst: Secondary | ICD-10-CM | POA: Diagnosis not present

## 2021-11-13 DIAGNOSIS — D225 Melanocytic nevi of trunk: Secondary | ICD-10-CM | POA: Diagnosis not present

## 2021-11-13 DIAGNOSIS — D2272 Melanocytic nevi of left lower limb, including hip: Secondary | ICD-10-CM | POA: Diagnosis not present

## 2021-11-13 DIAGNOSIS — L814 Other melanin hyperpigmentation: Secondary | ICD-10-CM | POA: Diagnosis not present

## 2021-11-13 DIAGNOSIS — D1801 Hemangioma of skin and subcutaneous tissue: Secondary | ICD-10-CM | POA: Diagnosis not present

## 2021-11-13 DIAGNOSIS — Z85820 Personal history of malignant melanoma of skin: Secondary | ICD-10-CM | POA: Diagnosis not present

## 2021-12-19 ENCOUNTER — Telehealth: Payer: Self-pay

## 2021-12-19 NOTE — Telephone Encounter (Signed)
Patient left message inquiring if she needs to keep her appointment with Dr. Berline Lopes on 12/26/21. She has been feeling really good and not had anymore issues. ? ?Called and spoke with patient this afternoon. Advised patient that per Dr. Lulu Riding last office note she would like to see her in the office for a final exam to assure complete healing. ? ?Patient verbalized understanding and will keep appointment on 12/26/21 at 2:15 pm.  ?

## 2021-12-26 ENCOUNTER — Inpatient Hospital Stay: Payer: PPO | Attending: Gynecologic Oncology | Admitting: Gynecologic Oncology

## 2021-12-26 ENCOUNTER — Other Ambulatory Visit: Payer: Self-pay

## 2021-12-26 ENCOUNTER — Encounter: Payer: Self-pay | Admitting: Gynecologic Oncology

## 2021-12-26 VITALS — BP 100/76 | HR 72 | Temp 97.7°F | Resp 16 | Ht 66.93 in | Wt 129.4 lb

## 2021-12-26 DIAGNOSIS — Z90722 Acquired absence of ovaries, bilateral: Secondary | ICD-10-CM | POA: Diagnosis not present

## 2021-12-26 DIAGNOSIS — N83201 Unspecified ovarian cyst, right side: Secondary | ICD-10-CM | POA: Diagnosis not present

## 2021-12-26 DIAGNOSIS — S30814D Abrasion of vagina and vulva, subsequent encounter: Secondary | ICD-10-CM

## 2021-12-26 NOTE — Patient Instructions (Addendum)
It was good to see you today.  Your vagina has healed well.  I am releasing you back to care with your primary care provider and OB/GYN.  Please do not hesitate to reach out if I can help with anything in the future. ?

## 2021-12-26 NOTE — Progress Notes (Signed)
Gynecologic Oncology Return Clinic Visit ? ?11/29/2021 ? ?Reason for Visit: Follow-up vaginal bleeding that developed postoperatively, responded to vaginal estrogen ? ?Treatment History: ?1/3: Robotic BSO, oversew stomach serosa in the setting of an adnexal mass.  Final pathology revealed benign cysts most compatible with serous cystadenoma in both ovaries.  No malignancy ? ?Interval History: ?Doing well.  Denies any issues since her last visit with me.  Denies any vaginal bleeding or discharge.  Denies any pelvic pain or vaginal pain.  Used vaginal estrogen for a couple of weeks after I saw her last and then stopped using it. ? ?Past Medical/Surgical History: ?Past Medical History:  ?Diagnosis Date  ? Anxiety disorder   ? Arthritis   ? Cataract   ? Essential tremor   ? /familial  ? Heart murmur 2021  ? corrected with valve surgery  ? Hypothyroidism   ? Interstitial cystitis   ? Malignant neoplasm of lower-outer quadrant of right breast of female, estrogen receptor positive ([place]) 02/11/2012  ? Neuropathy   ? Nonrheumatic mitral valve regurgitation   ? PAC (premature atrial contraction)   ? Paresthesias 07/29/2014  ? PREMATURE ATRIAL CONTRACTIONS 05/08/2009  ? Qualifier: Diagnosis of  By: [name], RN, [name]    ? Radiculopathy, cervical region   ? RLS (restless legs syndrome)   ? S/P minimally-invasive mitral valve repair 02/10/2020  ? Complex valvuloplasty including artificial Gore-tex neochord placement x8 with edge-to-edge suture plication of anterior commissure and 32 mm [name] 4D ring annuloplasty via right mini thoracotomy approach  ? Thyroid disease   ? Varicose veins of right lower extremity with complications 63/09/6008  ? ? ?Past Surgical History:  ?Procedure Laterality Date  ? bilateral cataract repair  july/aug 2021  ? Dr Tommy Rainwater  ? BREAST BIOPSY  2009  ? BREAST LUMPECTOMY  2010  ? CARDIAC CATHETERIZATION  2021  ? DILATION AND CURETTAGE OF UTERUS    ? age 61  ? EYE SURGERY Left   ? Pterygium  ? HEMANGIOMA  EXCISION Left 1990  ? arm  ? MELANOMA EXCISION Right 1980  ? thigh  ? MITRAL VALVE REPAIR Right 02/10/2020  ? Procedure: MINIMALLY INVASIVE MITRAL VALVE REPAIR (MVR) using Memo 4D 32 MM Mitral Valve Ring.;  Surgeon: Rexene Alberts, MD;  Location: [place];  Service: Open Heart Surgery;  Laterality: Right;  ? NECK SURGERY  10/2009  ? C5-6 fusion  ? RIGHT/LEFT HEART CATH AND CORONARY ANGIOGRAPHY N/A 01/04/2020  ? Procedure: RIGHT/LEFT HEART CATH AND CORONARY ANGIOGRAPHY;  Surgeon: Burnell Blanks, MD;  Location: [facility];  Service: Cardiovascular;  Laterality: N/A;  ? ROBOTIC ASSISTED BILATERAL SALPINGO OOPHERECTOMY Bilateral 10/01/2021  ? Procedure: XI ROBOTIC ASSISTED BILATERAL SALPINGO OOPHORECTOMY;  Surgeon: Lafonda Mosses, MD;  Location: [facility];  Service: Gynecology;  Laterality: Bilateral;  ? SHOULDER ARTHROSCOPY Left 2016  ? spider vein treatment    ? TEE WITHOUT CARDIOVERSION N/A 06/30/2018  ? Procedure: TRANSESOPHAGEAL ECHOCARDIOGRAM (TEE);  Surgeon: Sueanne Margarita, MD;  Location: [facility];  Service: Cardiovascular;  Laterality: N/A;  ? TEE WITHOUT CARDIOVERSION N/A 12/12/2019  ? Procedure: TRANSESOPHAGEAL ECHOCARDIOGRAM (TEE);  Surgeon: Dorothy Spark, MD;  Location: [place];  Service: Cardiovascular;  Laterality: N/A;  ? TEE WITHOUT CARDIOVERSION N/A 02/10/2020  ? Procedure: TRANSESOPHAGEAL ECHOCARDIOGRAM (TEE);  Surgeon: Rexene Alberts, MD;  Location: [place];  Service: Open Heart Surgery;  Laterality: N/A;  ? TONSILLECTOMY    ? as a child  ? uterine fibroids removed  2012  ? ? ?Family History  ?Problem Relation Age of Onset  ? Coronary artery disease Mother   ?     s/p MI (56s)  ? Hypertension Mother   ? Osteoporosis Mother   ?     wrist fracture  ? Alzheimer's disease Mother   ? Osteoarthritis Father   ? Neuropathy Father   ? Tremor Father   ? Other Father   ?     lymphedema  ? Fibromyalgia Sister   ? Heart disease Maternal Grandmother   ? Colon polyps Neg Hx   ?  Esophageal cancer Neg Hx   ? Pancreatic cancer Neg Hx   ? Stomach cancer Neg Hx   ? Rectal cancer Neg Hx   ? Ovarian cancer Neg Hx   ? Breast cancer Neg Hx   ? Colon cancer Neg Hx   ? Prostate cancer Neg Hx   ? Endometrial cancer Neg Hx   ? ? ?Social History  ? ?Socioeconomic History  ? Marital status: Married  ?  Spouse name: [name]  ? Number of children: 2  ? Years of education: Bachelor  ? Highest education level: Not on file  ?Occupational History  ? Not on file  ?Tobacco Use  ? Smoking status: Never  ? Smokeless tobacco: Never  ?Vaping Use  ? Vaping Use: Never used  ?Substance and Sexual Activity  ? Alcohol use: Yes  ?  Comment: Socially  ? Drug use: No  ? Sexual activity: Yes  ?  Birth control/protection: Post-menopausal  ?Other Topics Concern  ? Not on file  ?Social History Narrative  ? Patient is married to [name], has 2 children  ? Patient is right handed  ? Education level is Bachelor  ? Caffeine consumption is none other than in chocolate occasionally   ?   ? ?Social Determinants of Health  ? ?Financial Resource Strain: Not on file  ?Food Insecurity: Not on file  ?Transportation Needs: Not on file  ?Physical Activity: Not on file  ?Stress: Not on file  ?Social Connections: Not on file  ? ? ?Current Medications: ? ?Current Outpatient Medications:  ?  acetaminophen (TYLENOL) 500 MG tablet, Take 500-1,000 mg by mouth See admin instructions. Take 500 mg at night, may take a second 500 -1000 mg dose as needed for pain, Disp: , Rfl:  ?  aspirin EC 81 MG tablet, Take 1 tablet (81 mg total) by mouth daily. Swallow whole., Disp: 90 tablet, Rfl: 3 ?  betamethasone dipropionate 0.05 % cream, Apply 1 application. topically 4 (four) times daily., Disp: , Rfl:  ?  Biotin 5000 MCG CAPS, Take 5,000 mcg by mouth daily., Disp: , Rfl:  ?  Cholecalciferol (VITAMIN D) 2000 units tablet, Take 4,000 Units by mouth daily., Disp: , Rfl:  ?  Cyanocobalamin (B-12) 5000 MCG CAPS, Take 5,000 mcg by mouth once a week. Mondays, Disp: ,  Rfl:  ?  flecainide (TAMBOCOR) 50 MG tablet, Take 1 tablet (50 mg total) by mouth 2 (two) times daily., Disp: 180 tablet, Rfl: 1 ?  gabapentin (NEURONTIN) 600 MG tablet, Take 600 mg by mouth 2 (two) times daily., Disp: , Rfl:  ?  levothyroxine (SYNTHROID) 50 MCG tablet, Take 1 tablet (50 mcg total) by mouth daily before breakfast., Disp: , Rfl:  ?  melatonin 5 MG TABS, Take 5 mg by mouth at bedtime., Disp: , Rfl:  ?  metoprolol tartrate (LOPRESSOR) 25 MG tablet, Take 0.5 tablets (12.5 mg total) by mouth 2 (two)  times daily., Disp: 90 tablet, Rfl: 3 ?  Omega-3 Fatty Acids (FISH OIL) 1000 MG CAPS, Take by mouth in the morning and at bedtime., Disp: , Rfl:  ?  primidone (MYSOLINE) 50 MG tablet, Take 50 mg by mouth at bedtime., Disp: , Rfl:  ?  SYSTANE ULTRA 0.4-0.3 % SOLN, Place 1 drop into the left eye every 4 (four) hours as needed (dry/irritated eyes. (4-6 times daily)). , Disp: , Rfl:  ?  estradiol (ESTRACE VAGINAL) 0.1 MG/GM vaginal cream, Use finger tip size amount of estrace inserted into the entrance of the vagina every night at bedtime for 2 weeks, then decrease to twice a week for a total of one month. (Patient not taking: Reported on 12/26/2021), Disp: 42.5 g, Rfl: 1 ? ?Review of Systems: ?Denies appetite changes, fevers, chills, fatigue, unexplained weight changes. ?Denies hearing loss, neck lumps or masses, mouth sores, ringing in ears or voice changes. ?Denies cough or wheezing.  Denies shortness of breath. ?Denies chest pain or palpitations. Denies leg swelling. ?Denies abdominal distention, pain, blood in stools, constipation, diarrhea, nausea, vomiting, or early satiety. ?Denies pain with intercourse, dysuria, frequency, hematuria or incontinence. ?Denies hot flashes, pelvic pain, vaginal bleeding or vaginal discharge.   ?Denies joint pain, back pain or muscle pain/cramps. ?Denies itching, rash, or wounds. ?Denies dizziness, headaches, numbness or seizures. ?Denies swollen lymph nodes or glands, denies  easy bruising or bleeding. ?Denies anxiety, depression, confusion, or decreased concentration. ? ?Physical Exam: ?BP 100/76 (BP Location: Left Arm, Patient Position: Sitting)   Pulse 72   Temp 97.7 ?F (36.5

## 2021-12-27 DIAGNOSIS — H66001 Acute suppurative otitis media without spontaneous rupture of ear drum, right ear: Secondary | ICD-10-CM | POA: Diagnosis not present

## 2021-12-27 DIAGNOSIS — H9201 Otalgia, right ear: Secondary | ICD-10-CM | POA: Diagnosis not present

## 2022-01-14 ENCOUNTER — Telehealth: Payer: Self-pay | Admitting: Cardiology

## 2022-01-14 NOTE — Telephone Encounter (Signed)
Will route this message to our PharmD team for further review and advisement on this matter.  ?We will follow-up with the pt accordingly thereafter.  ?

## 2022-01-14 NOTE — Telephone Encounter (Signed)
I am unsure what they are trying to treat, but there is a risk of QTc prolongation. I would recommend asking if there is another agent that does not prolong QTc that can be used.  ?

## 2022-01-14 NOTE — Telephone Encounter (Signed)
Pt c/o medication issue: ? ?1. Name of Medication: Zpak ? ?2. How are you currently taking this medication (dosage and times per day)?   ? ?3. Are you having a reaction (difficulty breathing--STAT)? no ? ?4. What is your medication issue? Patient calling to see if its okay to take medication since she takes flecainide (TAMBOCOR) 50 MG tablet ? ?

## 2022-01-14 NOTE — Telephone Encounter (Signed)
Called the pt.  She is prescribed zpak for ongoing ear infection.  ?She is aware that per our Pharmacist Marcelle Overlie, zpak is contraindicated and there is a risk of QTc prolongation, and we recommend that she reach back out to her treating Provider and have them advise on another agent that does not prolong QTc.  ?Pt verbalized understanding and agrees with this plan. ?Pt states she will reach back out to her treating MD and endorse this information and to advise on an alternative regimen. ?Pt was appreciative for all the assistance provided.  ?

## 2022-02-11 DIAGNOSIS — M2669 Other specified disorders of temporomandibular joint: Secondary | ICD-10-CM | POA: Diagnosis not present

## 2022-02-11 DIAGNOSIS — Z8669 Personal history of other diseases of the nervous system and sense organs: Secondary | ICD-10-CM | POA: Diagnosis not present

## 2022-02-11 DIAGNOSIS — Z9089 Acquired absence of other organs: Secondary | ICD-10-CM | POA: Diagnosis not present

## 2022-02-11 DIAGNOSIS — H9201 Otalgia, right ear: Secondary | ICD-10-CM | POA: Diagnosis not present

## 2022-02-11 DIAGNOSIS — Z9889 Other specified postprocedural states: Secondary | ICD-10-CM | POA: Diagnosis not present

## 2022-02-11 DIAGNOSIS — H6121 Impacted cerumen, right ear: Secondary | ICD-10-CM | POA: Diagnosis not present

## 2022-03-28 DIAGNOSIS — E039 Hypothyroidism, unspecified: Secondary | ICD-10-CM | POA: Diagnosis not present

## 2022-03-28 DIAGNOSIS — G629 Polyneuropathy, unspecified: Secondary | ICD-10-CM | POA: Diagnosis not present

## 2022-04-10 ENCOUNTER — Other Ambulatory Visit: Payer: Self-pay | Admitting: *Deleted

## 2022-04-10 ENCOUNTER — Other Ambulatory Visit: Payer: Self-pay

## 2022-04-10 MED ORDER — FLECAINIDE ACETATE 50 MG PO TABS: 50.0000 mg | ORAL_TABLET | Freq: Two times a day (BID) | ORAL | 1 refills | Status: DC

## 2022-04-11 ENCOUNTER — Other Ambulatory Visit: Payer: Self-pay

## 2022-04-11 ENCOUNTER — Telehealth: Payer: Self-pay | Admitting: Cardiology

## 2022-04-11 MED ORDER — FLECAINIDE ACETATE 50 MG PO TABS: 50.0000 mg | ORAL_TABLET | Freq: Two times a day (BID) | ORAL | 1 refills | Status: DC

## 2022-04-11 NOTE — Telephone Encounter (Signed)
Rx resent, verified verbally w/ pharmacy they received Rx -- they confirmed receipt. Informed pt that Rx ready for pick up now.  She appreciates the help with this.

## 2022-04-11 NOTE — Telephone Encounter (Signed)
 *  STAT* If patient is at the pharmacy, call can be transferred to refill team.   1. Which medications need to be refilled? (please list name of each medication and dose if known) flecainide (TAMBOCOR) 50 MG tablet  2. Which pharmacy/location (including street and city if local pharmacy) is medication to be sent to? [place], [place] - [address] AT [address]  3. Do they need a 30 day or 90 day supply? 90 days   Pt said, per her pharmacy they did not receive refill. She needs refill today since she will be out of town for 3 weeks and she will leave on sunday

## 2022-04-14 ENCOUNTER — Encounter: Payer: Self-pay | Admitting: Cardiology

## 2022-04-15 MED ORDER — FLECAINIDE ACETATE 100 MG PO TABS: 100.0000 mg | ORAL_TABLET | Freq: Two times a day (BID) | ORAL | 1 refills | Status: DC

## 2022-04-15 NOTE — Telephone Encounter (Signed)
Pt scheduled to see Nicholes Rough PA-C for 7/31 at 1130.  Pt aware of appt date and time.  Dr. Johney Frame advised that 7/31 would be fine for her to be seen, and that way she will have been on increased dose of flecainide 100 mg po bid around that 2 weeks mark.  Dr. Johney Frame pt should see APP same time, to obtain EKG and assess the pt and symptoms if still occurring.  Scheduled her to see Nicholes Rough PA-C for 7/31 at 1130 for this.    Sent new dose increase of flecainide 100 mg po bid to the pts confirmed pharmacy of choice.  Pt agreed with plan.

## 2022-04-25 NOTE — Progress Notes (Unsigned)
Office Visit    Patient Name: Kellie Humphrey Date of Encounter: 04/28/2022  PCP:  Ginger Organ., MD   [facility]  Cardiologist:  Freada Bergeron, MD  Advanced Practice Provider:  No care team member to display Electrophysiologist:  None   Chief Complaint    Kellie Humphrey is a 76 y.o. female with a past medical history significant for MVP with TTE 05/2019 with severe MR, EF 60 to 65% status post minimally invasive mitral valve repair on Feb 10, 2020 with Dr. Roxy Manns, PACs, anxiety, atrial fibrillation and hypothyroidism presents today for overdue follow-up visit.   After her mitral valve repair in 2021, her postop course was complicated by atrial fibrillation with cardioversion after initiation of IV amiodarone.  Sent home on amiodarone 200 mg p.o. twice daily.  Flecainide that was previously started for frequent PVCs and PACs were discontinued at that time but have been since resumed since patient was off amiodarone.  Seen by Dr. [name] 2/22 and she was doing well.  Palpitations improved.  She was walking 3 to 4 miles a day without symptoms.  Euvolemic on exam.  TTE 6/21 with LVEF 65%, MV ring in place mildly elevated mean gradient of 7 mmHg, trivial MR, normal PASP, mild to moderate TR, RAP 3 mmHg.  Last seen in the office 11/22 she was doing well at that time.  She is walking 3 miles a day without any exertional symptoms.  She was having palpitations 3-4 times a day.  She described palpitations as a fluttering in her chest.  They did not bother her.  Amiodarone was discontinued but she was still on flecainide to manage the palpitations.  No other symptoms.  Today, she states that she has had an increase in palpitations here recently.  They have been well controlled on flecainide but are becoming more problematic.  Palpitations are random and last hours.  She will have 4-7 regular beats and then a skipped beat.  Sometimes she feels like she has  fluttering in her chest.  Her Apple Watch picked up "atrial fibrillation".  It is difficult to tell if this is true atrial fibrillation on the tracings.  She states that her heart rate never felt fast.  She does have a history of mitral valve prolapse but underwent a mitral valve repair back in 6/21.  No recent echocardiogram. Sometimes see spots when walking outside but they don't last long.   Reports no shortness of breath nor dyspnea on exertion. Reports no chest pain, pressure, or tightness. No edema, orthopnea, PND.   Past Medical History    Past Medical History:  Diagnosis Date   Anxiety disorder    Arthritis    Cataract    Essential tremor    /familial   Heart murmur 2021   corrected with valve surgery   Hypothyroidism    Interstitial cystitis    Malignant neoplasm of lower-outer quadrant of right breast of female, estrogen receptor positive ([place]) 02/11/2012   Neuropathy    Nonrheumatic mitral valve regurgitation    PAC (premature atrial contraction)    Paresthesias 07/29/2014   PREMATURE ATRIAL CONTRACTIONS 05/08/2009   Qualifier: Diagnosis of  By: [name], RN, [name]     Radiculopathy, cervical region    RLS (restless legs syndrome)    S/P minimally-invasive mitral valve repair 02/10/2020   Complex valvuloplasty including artificial Gore-tex neochord placement x8 with edge-to-edge suture plication of anterior commissure and 32 mm [name] 4D  ring annuloplasty via right mini thoracotomy approach   Thyroid disease    Varicose veins of right lower extremity with complications 69/62/9528   Past Surgical History:  Procedure Laterality Date   bilateral cataract repair  july/aug 2021   Dr Tommy Rainwater   BREAST BIOPSY  2009   BREAST LUMPECTOMY  2010   [place]  2021   DILATION AND CURETTAGE OF UTERUS     age 11   EYE SURGERY Left    Pterygium   HEMANGIOMA EXCISION Left 1990   arm   MELANOMA EXCISION Right 1980   thigh   MITRAL VALVE REPAIR Right 02/10/2020    Procedure: MINIMALLY INVASIVE MITRAL VALVE REPAIR (MVR) using Memo 4D 32 MM Mitral Valve Ring.;  Surgeon: Rexene Alberts, MD;  Location: [facility];  Service: Open Heart Surgery;  Laterality: Right;   NECK SURGERY  10/2009   C5-6 fusion   RIGHT/LEFT HEART CATH AND CORONARY ANGIOGRAPHY N/A 01/04/2020   Procedure: RIGHT/LEFT HEART CATH AND CORONARY ANGIOGRAPHY;  Surgeon: Burnell Blanks, MD;  Location: [facility];  Service: Cardiovascular;  Laterality: N/A;   ROBOTIC ASSISTED BILATERAL SALPINGO OOPHERECTOMY Bilateral 10/01/2021   Procedure: XI ROBOTIC ASSISTED BILATERAL SALPINGO OOPHORECTOMY;  Surgeon: Lafonda Mosses, MD;  Location: [facility];  Service: Gynecology;  Laterality: Bilateral;   SHOULDER ARTHROSCOPY Left 2016   spider vein treatment     TEE WITHOUT CARDIOVERSION N/A 06/30/2018   Procedure: TRANSESOPHAGEAL ECHOCARDIOGRAM (TEE);  Surgeon: Sueanne Margarita, MD;  Location: [facility];  Service: Cardiovascular;  Laterality: N/A;   TEE WITHOUT CARDIOVERSION N/A 12/12/2019   Procedure: TRANSESOPHAGEAL ECHOCARDIOGRAM (TEE);  Surgeon: Dorothy Spark, MD;  Location: [facility];  Service: Cardiovascular;  Laterality: N/A;   TEE WITHOUT CARDIOVERSION N/A 02/10/2020   Procedure: TRANSESOPHAGEAL ECHOCARDIOGRAM (TEE);  Surgeon: Rexene Alberts, MD;  Location: [facility];  Service: Open Heart Surgery;  Laterality: N/A;   TONSILLECTOMY     as a child   uterine fibroids removed  2012    Allergies  Allergies  Allergen Reactions   Codeine Nausea And Vomiting   Phenergan [Promethazine] Other (See Comments)    Long QT   Betadine [Povidone Iodine] Other (See Comments)    Irritation - vaginal   Oxycodone Nausea And Vomiting    EKGs/Labs/Other Studies Reviewed:   The following studies were reviewed today:   Zio 02/2020: HR of 64 bpm, max HR of 125 bpm, and avg HR of 73 BPM. Few very shors runs of SVT< the longest lasting 5 beats. Ocassional PACs and PVs. No atrial  fibrillation or ventricular tachycardia.   Sinus rhythm to sinus tachycardia. Benign heart monitor. Patient's diary correlates with PVCs.   TTE 02/2020: IMPRESSIONS   1. The first postoperative TTE, LVEF 55-60% (previously 60-65%). Since  the last study there is new mitral valve ring with mildly elevated mean  gradient 7 mmHg and only trivial mitral regurgitation. Normal RVSP.   2. Left ventricular ejection fraction, by estimation, is 55 to 60%. The  left ventricle has normal function. The left ventricle has no regional  wall motion abnormalities. Left ventricular diastolic function could not  be evaluated.   3. Right ventricular systolic function is normal. The right ventricular  size is normal. There is normal pulmonary artery systolic pressure. The  estimated right ventricular systolic pressure is 41.3 mmHg.   4. The mitral valve has been repaired/replaced. Trivial mitral valve  regurgitation. Mild to moderate mitral stenosis. The mean mitral valve  gradient is 7.0 mmHg with average heart rate of 68 bpm. There is a 32 mm  Memo 4D Mitral Valve Ring present in the   mitral position. Procedure Date: 02/10/2020.   5. Tricuspid valve regurgitation is mild to moderate.   6. The aortic valve is normal in structure. Aortic valve regurgitation is  not visualized. No aortic stenosis is present.   7. The inferior vena cava is normal in size with greater than 50%  respiratory variability, suggesting right atrial pressure of 3 mmHg.   TTE: 05/2019    1. Left ventricular ejection fraction, by visual estimation, is 65 to  70%. The left ventricle has normal function. Normal left ventricular size.  There is no left ventricular hypertrophy.   2. Left ventricular diastolic Doppler parameters are consistent with  impaired relaxation pattern of LV diastolic filling.   3. Global right ventricle has normal systolic function.The right  ventricular size is normal. No increase in right ventricular wall   thickness.   4. Left atrial size was normal.   5. Right atrial size was normal.   6. Moderate to severe mitral valve regurgitation.   7. MV leaflets have myxomatous appearance There is mild prolapse of both  leaflets There is moderate to severe MR.   8. The tricuspid valve is normal in structure. Tricuspid valve  regurgitation is mild.   9. The aortic valve is tricuspid Aortic valve regurgitation was not  visualized by color flow Doppler. Mild aortic valve sclerosis without  stenosis.  10. The pulmonic valve was normal in structure. Pulmonic valve  regurgitation is mild by color flow Doppler.    ETT on flecanide     Study Highlights      Blood pressure demonstrated a normal response to exercise. No T wave inversion was noted during stress. Upsloping ST segment depression ST segment depression of 1 mm was noted during stress in the II, III, aVF, V6, V5 and V4 leads, and returning to baseline after less than 1 minute of recovery. The patient experienced no angina during the stress test Overall, the patient's exercise capacity was excellent. Duke Treadmill Score: low risk   Negative stress test without evidence of ischemia at given workload. Excellent exercise capacity. Frequent PVC's noted in early recovery.      Echo TEE 06/30/18 - Left ventricle: Systolic function was normal. The estimated   ejection fraction was in the range of 60% to 65%. Wall motion was   normal; there were no regional wall motion abnormalities. - Aortic valve: Trileaflet; mildly thickened leaflets. There was   trivial regurgitation. - Mitral valve: Moderate, holosystolicprolapse, involving the   anterior leaflet and the posterior leaflet. There was moderate to   severe regurgitation. Effective regurgitant orifice (PISA): 0.22   cm^2. Regurgitant volume (PISA): 42 ml. - Left atrium: No evidence of thrombus in the atrial cavity or   appendage. - Right atrium: The atrium was mildly dilated. No evidence of    thrombus in the atrial cavity or appendage. - Tricuspid valve: There was mild regurgitation. - Pulmonic valve: There was trivial regurgitation.   TTE 06/17/18 Myxomatous, moderately thickened valve with bileaflet prolapse.   Mitral regurgitation jet is central and at least moderate to   severe with reversal of forward flow in the pulmonary veins.   However, left atrial size is normal and so are right sided   pressures. A TEE is recommended for further evaluation.   48 hour Holter 06/09/18   Sinus bradycardia to sinus tachycardia.  Infrequent PACs and frequent PVCs (1600 in 48 hrs = 1 % of all beats). SVT runs the longest consisting of 15 beats.   Sinus bradycardia to sinus tachycardia. Infrequent PACs and frequent PVCs (1600 in 48 hrs = 1 % of all beats). SVT runs the longest consisting of 15 beats. Symptoms correlating with PVCs.  EKG:  EKG is  ordered today.  The ekg ordered today demonstrates sinus bradycardia, rate 59 bpm.  Recent Labs: 09/18/2021: ALT 25; BUN 11; Creatinine, Ser 0.69; Potassium 3.9; Sodium 140 10/09/2021: Hemoglobin 13.3; Platelets 256  Recent Lipid Panel No results found for: "CHOL", "TRIG", "HDL", "CHOLHDL", "VLDL", "LDLCALC", "LDLDIRECT"  Risk Assessment/Calculations:   CHA2DS2-VASc Score = 2   This indicates a 2.2% annual risk of stroke. The patient's score is based upon: CHF History: 0 HTN History: 0 Diabetes History: 0 Stroke History: 0 Vascular Disease History: 0 Age Score: 1 Gender Score: 1      Home Medications   Current Meds  Medication Sig   acetaminophen (TYLENOL) 500 MG tablet Take 500-1,000 mg by mouth See admin instructions. Take 500 mg at night, may take a second 500 -1000 mg dose as needed for pain   aspirin EC 81 MG tablet Take 1 tablet (81 mg total) by mouth daily. Swallow whole.   betamethasone dipropionate 0.05 % cream Apply 1 application. topically 4 (four) times daily.   Biotin 5000 MCG CAPS Take 5,000 mcg by mouth daily.    Cholecalciferol (VITAMIN D) 2000 units tablet Take 2,000 Units by mouth daily.   Cyanocobalamin (B-12) 5000 MCG CAPS Take 5,000 mcg by mouth once a week. Mondays   flecainide (TAMBOCOR) 50 MG tablet Take 1 tablet (50 mg total) by mouth 2 (two) times daily.   gabapentin (NEURONTIN) 600 MG tablet Take 600 mg by mouth 2 (two) times daily.   levothyroxine (SYNTHROID) 50 MCG tablet Take 1 tablet (50 mcg total) by mouth daily before breakfast.   melatonin 5 MG TABS Take 5 mg by mouth at bedtime.   metoprolol tartrate (LOPRESSOR) 25 MG tablet Take 0.5 tablets (12.5 mg total) by mouth 2 (two) times daily.   metroNIDAZOLE (METROCREAM) 0.75 % cream Apply 1 Application topically 2 (two) times daily.   Omega-3 Fatty Acids (FISH OIL) 1000 MG CAPS Take by mouth in the morning and at bedtime.   primidone (MYSOLINE) 50 MG tablet Take 50 mg by mouth at bedtime.   SYSTANE ULTRA 0.4-0.3 % SOLN Place 1 drop into the left eye every 4 (four) hours as needed (dry/irritated eyes. (4-6 times daily)).    [DISCONTINUED] flecainide (TAMBOCOR) 100 MG tablet Take 1 tablet (100 mg total) by mouth 2 (two) times daily.     Review of Systems      All other systems reviewed and are otherwise negative except as noted above.  Physical Exam    VS:  BP 104/68   Pulse (!) 59   Ht '5\' 7"'$  (1.702 m)   Wt 129 lb (58.5 kg)   SpO2 98%   BMI 20.20 kg/m  , BMI Body mass index is 20.2 kg/m.  Wt Readings from Last 3 Encounters:  04/28/22 129 lb (58.5 kg)  12/26/21 129 lb 6.4 oz (58.7 kg)  10/28/21 131 lb 3.2 oz (59.5 kg)     GEN: Well nourished, well developed, in no acute distress. HEENT: normal. Neck: Supple, no JVD, carotid bruits, or masses. Cardiac: RRR, no murmurs, rubs, or gallops. No clubbing, cyanosis, edema.  Radials/PT 2+ and equal bilaterally.  Respiratory:  Respirations regular and unlabored, clear to auscultation bilaterally. GI: Soft, nontender, nondistended. MS: No deformity or atrophy. Skin: Warm and dry, no  rash. Neuro:  Strength and sensation are intact. Psych: Normal affect.  Assessment & Plan    MVP with severe MR status post minimally invasive MVR -Increased palpitations, will order Echo and Zio patch -Will order magnesium level  Postoperative atrial fibrillation -She has some rate-controlled atrial fibrillation that her Apple watch picked up -Zio monitor to further evaluate -patient is aware that if she is having paroxysmal afib she will need to be on anticoagulation -CHA2DS2-VASc score of 2  Symptomatic PVCs/PACs -Flecainide increased to '100mg'$  without any significant improvement. She can return to her '50mg'$  BID dose -BP and HR borderline today, no room for increasing her metoprolol  -Will refer to EP for further workup  Hyperlipidemia -LDL slightly above goal 2/23 -Will need to be addressed next visit         Disposition: Follow up 6 months  with Freada Bergeron, MD or APP.  Signed, Elgie Collard, PA-C 04/28/2022, 12:12 PM [place]

## 2022-04-28 ENCOUNTER — Ambulatory Visit (INDEPENDENT_AMBULATORY_CARE_PROVIDER_SITE_OTHER): Payer: PPO

## 2022-04-28 ENCOUNTER — Encounter: Payer: Self-pay | Admitting: Physician Assistant

## 2022-04-28 ENCOUNTER — Ambulatory Visit: Payer: PPO | Admitting: Physician Assistant

## 2022-04-28 VITALS — BP 104/68 | HR 59 | Ht 67.0 in | Wt 129.0 lb

## 2022-04-28 DIAGNOSIS — I4891 Unspecified atrial fibrillation: Secondary | ICD-10-CM

## 2022-04-28 DIAGNOSIS — Z9889 Other specified postprocedural states: Secondary | ICD-10-CM

## 2022-04-28 DIAGNOSIS — I340 Nonrheumatic mitral (valve) insufficiency: Secondary | ICD-10-CM

## 2022-04-28 DIAGNOSIS — I493 Ventricular premature depolarization: Secondary | ICD-10-CM

## 2022-04-28 DIAGNOSIS — E782 Mixed hyperlipidemia: Secondary | ICD-10-CM

## 2022-04-28 LAB — MAGNESIUM: Magnesium: 2.2 mg/dL (ref 1.6–2.3)

## 2022-04-28 MED ORDER — FLECAINIDE ACETATE 50 MG PO TABS: 50.0000 mg | ORAL_TABLET | Freq: Two times a day (BID) | ORAL | 3 refills | Status: DC

## 2022-04-28 NOTE — Patient Instructions (Signed)
Medication Instructions:  Your physician has recommended you make the following change in your medication:  1) DECREASE Flecainide back to 50 mg twice daily  *If you need a refill on your cardiac medications before your next appointment, please call your pharmacy*   Lab Work: TODAY: Magnesium  If you have labs (blood work) drawn today and your tests are completely normal, you will receive your results only by: [place] (if you have MyChart) OR A paper copy in the mail If you have any lab test that is abnormal or we need to change your treatment, we will call you to review the results.   Testing/Procedures: Your physician has requested that you have an echocardiogram. Echocardiography is a painless test that uses sound waves to create images of your heart. It provides your doctor with information about the size and shape of your heart and how well your heart's chambers and valves are working. This procedure takes approximately one hour. There are no restrictions for this procedure.  Your physician has recommended that you wear an event monitor. Event monitors are medical devices that record the heart's electrical activity. Doctors most often [place] these monitors to diagnose arrhythmias. Arrhythmias are problems with the speed or rhythm of the heartbeat. The monitor is a small, portable device. You can wear one while you do your normal daily activities. This is usually used to diagnose what is causing palpitations/syncope (passing out).  Follow-Up: At [facility], you and your health needs are our priority.  As part of our continuing mission to provide you with exceptional heart care, we have created designated Provider Care Teams.  These Care Teams include your primary Cardiologist (physician) and Advanced Practice Providers (APPs -  Physician Assistants and Nurse Practitioners) who all work together to provide you with the care you need, when you need it.  You have been referred to see  an Electrophysiologist   Other Instructions [place] Monitor Instructions  Your physician has requested you wear a ZIO patch monitor for 14 days.  This is a single patch monitor. [employer] supplies one patch monitor per enrollment. Additional stickers are not available. Please do not apply patch if you will be having a Nuclear Stress Test,  Echocardiogram, Cardiac CT, MRI, or Chest Xray during the period you would be wearing the  monitor. The patch cannot be worn during these tests. You cannot remove and re-apply the  ZIO XT patch monitor.  Your ZIO patch monitor will be mailed 3 day USPS to your address on file. It may take 3-5 days  to receive your monitor after you have been enrolled.  Once you have received your monitor, please review the enclosed instructions. Your monitor  has already been registered assigning a specific monitor serial # to you.  Billing and Patient Assistance Program Information  We have supplied [employer] with any of your insurance information on file for billing purposes. [employer] offers a sliding scale Patient Assistance Program for patients that do not have  insurance, or whose insurance does not completely cover the cost of the ZIO monitor.  You must apply for the Patient Assistance Program to qualify for this discounted rate.  To apply, please call [employer] at [phone], select option 4, select option 2, ask to apply for  Patient Assistance Program. Kellie Humphrey will ask your household income, and how many people  are in your household. They will quote your out-of-pocket cost based on that information.  [employer] will also be able to set up  a 75-month interest-free payment plan if needed.  Applying the monitor   Shave hair from upper left chest.  Hold abrader disc by orange tab. Rub abrader in 40 strokes over the upper left chest as  indicated in your monitor instructions.  Clean area with 4 enclosed alcohol pads. Let dry.  Apply patch as indicated in  monitor instructions. Patch will be placed under collarbone on left  side of chest with arrow pointing upward.  Rub patch adhesive wings for 2 minutes. Remove white label marked "1". Remove the white  label marked "2". Rub patch adhesive wings for 2 additional minutes.  While looking in a mirror, press and release button in center of patch. A small green light will  flash 3-4 times. This will be your only indicator that the monitor has been turned on.  Do not shower for the first 24 hours. You may shower after the first 24 hours.  Press the button if you feel a symptom. You will hear a small click. Record Date, Time and  Symptom in the Patient Logbook.  When you are ready to remove the patch, follow instructions on the last 2 pages of Patient  Logbook. Stick patch monitor onto the last page of Patient Logbook.  Place Patient Logbook in the blue and white box. Use locking tab on box and tape box closed  securely. The blue and white box has prepaid postage on it. Please place it in the mailbox as  soon as possible. Your physician should have your test results approximately 7 days after the  monitor has been [facility]  Call [place]at [phone] you have questions regarding  your ZIO XT patch monitor. Call them immediately if you see an orange light blinking on your  monitor.  If your monitor falls off in less than 4 days, contact our Monitor department at [phone]  If your monitor becomes loose or falls off after 4 days call [employer] at [phone]  suggestions on securing your monitor   Important Information About [place]

## 2022-04-28 NOTE — Progress Notes (Unsigned)
Enrolled for [employer] to mail a ZIO XT long term holter monitor to the patients address on file.  ? ?Dr. Pemberton to read. ?

## 2022-05-01 ENCOUNTER — Encounter: Payer: Self-pay | Admitting: Cardiology

## 2022-05-05 DIAGNOSIS — H04123 Dry eye syndrome of bilateral lacrimal glands: Secondary | ICD-10-CM | POA: Diagnosis not present

## 2022-05-07 ENCOUNTER — Ambulatory Visit (HOSPITAL_COMMUNITY): Payer: PPO | Attending: Physician Assistant

## 2022-05-07 DIAGNOSIS — Z9889 Other specified postprocedural states: Secondary | ICD-10-CM | POA: Insufficient documentation

## 2022-05-07 DIAGNOSIS — I4891 Unspecified atrial fibrillation: Secondary | ICD-10-CM | POA: Diagnosis not present

## 2022-05-07 DIAGNOSIS — I493 Ventricular premature depolarization: Secondary | ICD-10-CM

## 2022-05-07 DIAGNOSIS — I340 Nonrheumatic mitral (valve) insufficiency: Secondary | ICD-10-CM | POA: Diagnosis not present

## 2022-05-07 DIAGNOSIS — I342 Nonrheumatic mitral (valve) stenosis: Secondary | ICD-10-CM | POA: Diagnosis not present

## 2022-05-07 DIAGNOSIS — E782 Mixed hyperlipidemia: Secondary | ICD-10-CM | POA: Insufficient documentation

## 2022-05-07 LAB — ECHOCARDIOGRAM COMPLETE
Area-P 1/2: 1.2 cm2
MV VTI: 1.09 cm2

## 2022-05-28 DIAGNOSIS — I340 Nonrheumatic mitral (valve) insufficiency: Secondary | ICD-10-CM | POA: Diagnosis not present

## 2022-05-28 DIAGNOSIS — I4891 Unspecified atrial fibrillation: Secondary | ICD-10-CM | POA: Diagnosis not present

## 2022-06-05 ENCOUNTER — Encounter: Payer: Self-pay | Admitting: Internal Medicine

## 2022-06-05 ENCOUNTER — Ambulatory Visit: Payer: PPO | Attending: Internal Medicine | Admitting: Internal Medicine

## 2022-06-05 VITALS — BP 108/66 | HR 70 | Ht 67.0 in | Wt 132.0 lb

## 2022-06-05 DIAGNOSIS — I491 Atrial premature depolarization: Secondary | ICD-10-CM

## 2022-06-05 DIAGNOSIS — I493 Ventricular premature depolarization: Secondary | ICD-10-CM

## 2022-06-05 DIAGNOSIS — Z9889 Other specified postprocedural states: Secondary | ICD-10-CM | POA: Diagnosis not present

## 2022-06-05 DIAGNOSIS — Z5181 Encounter for therapeutic drug level monitoring: Secondary | ICD-10-CM

## 2022-06-05 DIAGNOSIS — I480 Paroxysmal atrial fibrillation: Secondary | ICD-10-CM | POA: Diagnosis not present

## 2022-06-05 DIAGNOSIS — I4891 Unspecified atrial fibrillation: Secondary | ICD-10-CM

## 2022-06-05 MED ORDER — METOPROLOL TARTRATE 25 MG PO TABS: 25.0000 mg | ORAL_TABLET | Freq: Two times a day (BID) | ORAL | 3 refills | Status: DC

## 2022-06-05 NOTE — Patient Instructions (Addendum)
Medication Instructions:  Your physician has recommended you make the following change in your medication:   Medication dose INCREASE:   Metoprolol Tartrate 25 MG   Take one tablet ( 25 MG ) by mouth twice a day    Lab Work: None ordered.  If you have labs (blood work) drawn today and your tests are completely normal, you will receive your results only by: [place] (if you have MyChart) OR A paper copy in the mail If you have any lab test that is abnormal or we need to change your treatment, we will call you to review the results. Testing/Procedures: None ordered.  Follow-Up: At [facility], you and your health needs are our priority.  As part of our continuing mission to provide you with exceptional heart care, we have created designated Provider Care Teams.  These Care Teams include your primary Cardiologist (physician) and Advanced Practice Providers (APPs -  Physician Assistants and Nurse Practitioners) who all work together to provide you with the care you need, when you need it.  We recommend signing up for the patient portal called "MyChart".  Sign up information is provided on this After Visit Summary.  MyChart is used to connect with patients for Virtual Visits (Telemedicine).  Patients are able to view lab/test results, encounter notes, upcoming appointments, etc.  Non-urgent messages can be sent to your provider as well.   To learn more about what you can do with MyChart, go to [email or web address].    Your next appointment:    Please schedule a follow up appointment with Dr. Cristopher Peru in 8 to 10 weeks at [facility] on [address].   The format for your next appointment:   In Person  Provider:   Cristopher Peru, MD{or one of the following Advanced Practice Providers on your designated Care Team:   Tommye Standard, [place] Legrand Como "Jonni Sanger" Chalmers Cater, [place]   Important Information About Sugar

## 2022-06-05 NOTE — Progress Notes (Signed)
HPI Kellie Humphrey is referred by Nicholes Rough, PA-C for evaluation of palpitations. She is a pleasant 76 yo woman with MV repair s/p MV surgery. She was initially on amiodarone but then switched back to flecainide. She has continued to be symptomatic and  she had occaisional PAC's and PVC's and very brief NS SVT. She thinks that on higher dose flecainide her palpitations were worse. She thinks that she can tolerated the palpitations but would prefer better control.  Allergies  Allergen Reactions   Codeine Nausea And Vomiting   Phenergan [Promethazine] Other (See Comments)    Long QT   Betadine [Povidone Iodine] Other (See Comments)    Irritation - vaginal   Oxycodone Nausea And Vomiting     Current Outpatient Medications  Medication Sig Dispense Refill   acetaminophen (TYLENOL) 500 MG tablet Take 500-1,000 mg by mouth See admin instructions. Take 500 mg at night, may take a second 500 -1000 mg dose as needed for pain     aspirin EC 81 MG tablet Take 1 tablet (81 mg total) by mouth daily. Swallow whole. 90 tablet 3   betamethasone dipropionate 0.05 % cream Apply 1 application. topically 4 (four) times daily.     Biotin 5000 MCG CAPS Take 5,000 mcg by mouth daily.     Cholecalciferol (VITAMIN D) 2000 units tablet Take 2,000 Units by mouth daily.     Cyanocobalamin (B-12) 5000 MCG CAPS Take 5,000 mcg by mouth once a week. Mondays     flecainide (TAMBOCOR) 50 MG tablet Take 1 tablet (50 mg total) by mouth 2 (two) times daily. 180 tablet 3   gabapentin (NEURONTIN) 600 MG tablet Take 600 mg by mouth 2 (two) times daily.     levothyroxine (SYNTHROID) 50 MCG tablet Take 1 tablet (50 mcg total) by mouth daily before breakfast.     melatonin 5 MG TABS Take 5 mg by mouth at bedtime.     metroNIDAZOLE (METROCREAM) 0.75 % cream Apply 1 Application topically 2 (two) times daily.     primidone (MYSOLINE) 50 MG tablet Take 50 mg by mouth at bedtime.     SYSTANE ULTRA 0.4-0.3 % SOLN Place 1 drop into  the left eye every 4 (four) hours as needed (dry/irritated eyes. (4-6 times daily)).      metoprolol tartrate (LOPRESSOR) 25 MG tablet Take 1 tablet (25 mg total) by mouth 2 (two) times daily. 90 tablet 3   Omega-3 Fatty Acids (FISH OIL) 1000 MG CAPS Take by mouth in the morning and at bedtime. (Patient not taking: Reported on 06/05/2022)     No current facility-administered medications for this visit.     Past Medical History:  Diagnosis Date   Anxiety disorder    Arthritis    Cataract    Essential tremor    /familial   Heart murmur 2021   corrected with valve surgery   Hypothyroidism    Interstitial cystitis    Malignant neoplasm of lower-outer quadrant of right breast of female, estrogen receptor positive ([place]) 02/11/2012   Neuropathy    Nonrheumatic mitral valve regurgitation    PAC (premature atrial contraction)    Paresthesias 07/29/2014   PREMATURE ATRIAL CONTRACTIONS 05/08/2009   Qualifier: Diagnosis of  By: [name], RN, [name]     Radiculopathy, cervical region    RLS (restless legs syndrome)    S/P minimally-invasive mitral valve repair 02/10/2020   Complex valvuloplasty including artificial Gore-tex neochord placement x8 with edge-to-edge suture plication of anterior  commissure and 32 mm [name] 4D ring annuloplasty via right mini thoracotomy approach   Thyroid disease    Varicose veins of right lower extremity with complications 93/26/7124    ROS:   All systems reviewed and negative except as noted in the HPI.   Past Surgical History:  Procedure Laterality Date   bilateral cataract repair  july/aug 2021   Dr Tommy Rainwater   BREAST BIOPSY  2009   BREAST LUMPECTOMY  2010   [place]  2021   DILATION AND CURETTAGE OF UTERUS     age 41   EYE SURGERY Left    Pterygium   HEMANGIOMA EXCISION Left 1990   arm   MELANOMA EXCISION Right 1980   thigh   MITRAL VALVE REPAIR Right 02/10/2020   Procedure: MINIMALLY INVASIVE MITRAL VALVE REPAIR (MVR) using  Memo 4D 32 MM Mitral Valve Ring.;  Surgeon: Rexene Alberts, MD;  Location: [facility];  Service: Open Heart Surgery;  Laterality: Right;   NECK SURGERY  10/2009   C5-6 fusion   RIGHT/LEFT HEART CATH AND CORONARY ANGIOGRAPHY N/A 01/04/2020   Procedure: RIGHT/LEFT HEART CATH AND CORONARY ANGIOGRAPHY;  Surgeon: Burnell Blanks, MD;  Location: [place] [facility];  Service: Cardiovascular;  Laterality: N/A;   ROBOTIC ASSISTED BILATERAL SALPINGO OOPHERECTOMY Bilateral 10/01/2021   Procedure: XI ROBOTIC ASSISTED BILATERAL SALPINGO OOPHORECTOMY;  Surgeon: Lafonda Mosses, MD;  Location: [facility];  Service: Gynecology;  Laterality: Bilateral;   SHOULDER ARTHROSCOPY Left 2016   spider vein treatment     TEE WITHOUT CARDIOVERSION N/A 06/30/2018   Procedure: TRANSESOPHAGEAL ECHOCARDIOGRAM (TEE);  Surgeon: Sueanne Margarita, MD;  Location: [facility] ENDOSCOPY;  Service: Cardiovascular;  Laterality: N/A;   TEE WITHOUT CARDIOVERSION N/A 12/12/2019   Procedure: TRANSESOPHAGEAL ECHOCARDIOGRAM (TEE);  Surgeon: Dorothy Spark, MD;  Location: [facility] END[facility]COPY;  Service: Cardiovascular;  Laterality: N/A;   TEE WITHOUT CARDIOVERSION N/A 02/10/2020   Procedure: TRANSESOPHAGEAL ECHOCARDIOGRAM (TEE);  Surgeon: Rexene Alberts, MD;  Location: [place];  Service: Open Heart Surgery;  Laterality: N/A;   TONSILLECTOMY     as a child   uterine fibroids removed  2012     Family History  Problem Relation Age of Onset   Coronary artery disease Mother        s/p MI (53s)   Hypertension Mother    Osteoporosis Mother        wrist fracture   Alzheimer's disease Mother    Osteoarthritis Father    Neuropathy Father    Tremor Father    Other Father        lymphedema   Fibromyalgia Sister    Heart disease Maternal Grandmother    Colon polyps Neg Hx    Esophageal cancer Neg Hx    Pancreatic cancer Neg Hx    Stomach cancer Neg Hx    Rectal cancer Neg Hx    Ovarian cancer Neg Hx    Breast cancer Neg Hx    Colon cancer Neg  Hx    Prostate cancer Neg Hx    Endometrial cancer Neg Hx      Social History   Socioeconomic History   Marital status: Married    Spouse name: [name]   Number of children: 2   Years of education: Bachelor   Highest education level: Not on file  Occupational History   Not on file  Tobacco Use   Smoking status: Never   Smokeless tobacco: Never  Vaping Use   Vaping Use:  Never used  Substance and Sexual Activity   Alcohol use: Yes    Comment: Socially   Drug use: No   Sexual activity: Yes    Birth control/protection: Post-menopausal  Other Topics Concern   Not on file  Social History Narrative   Patient is married to [name], has 2 children   Patient is right handed   Education level is Bachelor   Caffeine consumption is none other than in chocolate occasionally       Social Determinants of Radio broadcast assistant Strain: Not on file  Food Insecurity: Not on file  Transportation Needs: Not on file  Physical Activity: Not on file  Stress: Not on file  Social Connections: Not on file  Intimate Partner Violence: Not on file     BP 108/66   Pulse 70   Ht '5\' 7"'$  (1.702 m)   Wt 132 lb (59.9 kg)   SpO2 99%   BMI 20.67 kg/m   Physical Exam:  Well appearing NAD HEENT: Unremarkable Neck:  No JVD, no thyromegally Lymphatics:  No adenopathy Back:  No CVA tenderness Lungs:  Clear with no wheezes HEART:  Regular rate rhythm, no murmurs, no rubs, no clicks Abd:  soft, positive bowel sounds, no organomegally, no rebound, no guarding Ext:  2 plus pulses, no edema, no cyanosis, no clubbing Skin:  No rashes no nodules Neuro:  CN II through XII intact, motor grossly intact  EKG - nsr  Assess/Plan:  Palpitations - her symptoms are not particularly severe and her monitor is reassuring. However, she remains symptomatic. I have recommended she uptitrate her metoprolol. Other options would be to switch to propafenone. Mitral valve disease - her exam is without evidence  of significant MR. We will follow.  Carleene Overlie Rowin Bayron,MD

## 2022-06-27 DIAGNOSIS — H04123 Dry eye syndrome of bilateral lacrimal glands: Secondary | ICD-10-CM | POA: Diagnosis not present

## 2022-06-27 DIAGNOSIS — H26492 Other secondary cataract, left eye: Secondary | ICD-10-CM | POA: Diagnosis not present

## 2022-06-30 DIAGNOSIS — H26492 Other secondary cataract, left eye: Secondary | ICD-10-CM | POA: Diagnosis not present

## 2022-07-07 ENCOUNTER — Other Ambulatory Visit: Payer: Self-pay

## 2022-07-07 ENCOUNTER — Telehealth: Payer: Self-pay | Admitting: Internal Medicine

## 2022-07-07 DIAGNOSIS — H26492 Other secondary cataract, left eye: Secondary | ICD-10-CM | POA: Diagnosis not present

## 2022-07-07 DIAGNOSIS — Z5181 Encounter for therapeutic drug level monitoring: Secondary | ICD-10-CM

## 2022-07-07 DIAGNOSIS — Z9889 Other specified postprocedural states: Secondary | ICD-10-CM

## 2022-07-07 DIAGNOSIS — I480 Paroxysmal atrial fibrillation: Secondary | ICD-10-CM

## 2022-07-07 MED ORDER — METOPROLOL TARTRATE 25 MG PO TABS: 25.0000 mg | ORAL_TABLET | Freq: Two times a day (BID) | ORAL | 3 refills | Status: DC

## 2022-07-07 NOTE — Telephone Encounter (Signed)
*  STAT* If patient is at the pharmacy, call can be transferred to refill team.   1. Which medications need to be refilled? (please list name of each medication and dose if known) metoprolol tartrate (LOPRESSOR) 25 MG tablet  2. Which pharmacy/location (including street and city if local pharmacy) is medication to be sent to? [place], [place] - [address] AT [place]  3. Do they need a 30 day or 90 day supply? [place]

## 2022-07-08 ENCOUNTER — Encounter: Payer: Self-pay | Admitting: Internal Medicine

## 2022-07-08 DIAGNOSIS — Z9889 Other specified postprocedural states: Secondary | ICD-10-CM

## 2022-07-08 DIAGNOSIS — I480 Paroxysmal atrial fibrillation: Secondary | ICD-10-CM

## 2022-07-08 DIAGNOSIS — Z5181 Encounter for therapeutic drug level monitoring: Secondary | ICD-10-CM

## 2022-07-09 MED ORDER — METOPROLOL TARTRATE 25 MG PO TABS: 25.0000 mg | ORAL_TABLET | Freq: Two times a day (BID) | ORAL | 3 refills | Status: DC

## 2022-07-10 ENCOUNTER — Ambulatory Visit (INDEPENDENT_AMBULATORY_CARE_PROVIDER_SITE_OTHER): Payer: PPO | Admitting: Rehabilitative and Restorative Service Providers"

## 2022-07-10 ENCOUNTER — Encounter: Payer: Self-pay | Admitting: Rehabilitative and Restorative Service Providers"

## 2022-07-10 DIAGNOSIS — R293 Abnormal posture: Secondary | ICD-10-CM | POA: Diagnosis not present

## 2022-07-10 DIAGNOSIS — M6281 Muscle weakness (generalized): Secondary | ICD-10-CM | POA: Diagnosis not present

## 2022-07-10 DIAGNOSIS — M546 Pain in thoracic spine: Secondary | ICD-10-CM

## 2022-07-10 NOTE — Therapy (Signed)
OUTPATIENT PHYSICAL THERAPY THORACOLUMBAR EVALUATION   Patient Name: Kellie Humphrey MRN: [id number] DOB:08-27-1946, 76 y.o., female Today's Date: 07/10/2022   PT End of Session - 07/10/22 1547     Visit Number 1    Number of Visits 10    Date for PT Re-Evaluation 08/22/22    PT Start Time 1059    PT Stop Time 1146    PT Time Calculation (min) 47 min    Activity Tolerance Patient tolerated treatment well;No increased pain    Behavior During Therapy WFL for tasks assessed/performed             Past Medical History:  Diagnosis Date   Anxiety disorder    Arthritis    Cataract    Essential tremor    /familial   Heart murmur 2021   corrected with valve surgery   Hypothyroidism    Interstitial cystitis    Malignant neoplasm of lower-outer quadrant of right breast of female, estrogen receptor positive (Kellie Humphrey) 02/11/2012   Neuropathy    Nonrheumatic mitral valve regurgitation    PAC (premature atrial contraction)    Paresthesias 07/29/2014   PREMATURE ATRIAL CONTRACTIONS 05/08/2009   Qualifier: Diagnosis of  By: [name], RN, [name]     Radiculopathy, cervical region    RLS (restless legs syndrome)    S/P minimally-invasive mitral valve repair 02/10/2020   Complex valvuloplasty including artificial Gore-tex neochord placement x8 with edge-to-edge suture plication of anterior commissure and 32 mm [name] 4D ring annuloplasty via right mini thoracotomy approach   Thyroid disease    Varicose veins of right lower extremity with complications 95/63/8756   Past Surgical History:  Procedure Laterality Date   bilateral cataract repair  july/aug 2021   Dr Tommy Rainwater   BREAST BIOPSY  2009   BREAST LUMPECTOMY  2010   [place]  2021   [place]     age 75   EYE SURGERY Left    Pterygium   HEMANGIOMA EXCISION Left 1990   arm   MELANOMA EXCISION Right 1980   thigh   MITRAL VALVE REPAIR Right 02/10/2020   Procedure: MINIMALLY INVASIVE  MITRAL VALVE REPAIR (MVR) using Memo 4D 32 MM Mitral Valve Ring.;  Surgeon: Rexene Alberts, MD;  Location: [place];  Service: Open Heart Surgery;  Laterality: Right;   NECK SURGERY  10/2009   C5-6 fusion   RIGHT/LEFT HEART CATH AND CORONARY ANGIOGRAPHY N/A 01/04/2020   Procedure: RIGHT/LEFT HEART CATH AND CORONARY ANGIOGRAPHY;  Surgeon: Burnell Blanks, MD;  Location: [place] [facility];  Service: Cardiovascular;  Laterality: N/A;   ROBOTIC ASSISTED BILATERAL SALPINGO OOPHERECTOMY Bilateral 10/01/2021   Procedure: XI ROBOTIC ASSISTED BILATERAL SALPINGO OOPHORECTOMY;  Surgeon: Lafonda Mosses, MD;  [facility];  Service: Gynecology;  Laterality: Bilateral;   SHOULDER ARTHROSCOPY Left 2016   spider vein treatment     TEE WITHOUT CARDIOVERSION N/A 06/30/2018   Procedure: TRANSESOPHAGEAL ECHOCARDIOGRAM (TEE);  Surgeon: Sueanne Margarita, MD;  Location: [facility] ENDOSCOPY;  Service: Cardiovascular;  Laterality: N/A;   TEE WITHOUT CARDIOVERSION N/A 12/12/2019   Procedure: TRANSESOPHAGEAL ECHOCARDIOGRAM (TEE);  Surgeon: Dorothy Spark, MD;  Location: [facility] ENDOSCOPY;  Service: Cardiovascular;  Laterality: N/A;   TEE WITHOUT CARDIOVERSION N/A 02/10/2020   Procedure: TRANSESOPHAGEAL ECHOCARDIOGRAM (TEE);  Surgeon: Rexene Alberts, MD;  Location: [place];  Service: Open Heart Surgery;  Laterality: N/A;   TONSILLECTOMY     as a child   uterine fibroids removed  2012   Patient Active Problem List   Diagnosis Date Noted   PVC (premature ventricular contraction) 06/05/2022   Adnexal mass    Cyst of right ovary 08/21/2021   Weight loss 08/21/2021   Nausea without vomiting 08/21/2021   Encounter for removal of sutures 02/23/2020   Encounter for therapeutic drug monitoring 02/20/2020   Atrial fibrillation ([place]) 02/20/2020   S/P minimally-invasive mitral valve repair 02/10/2020   Nonrheumatic mitral valve regurgitation    Varicose veins of right lower extremity with complications 32/99/2426    Paresthesias 07/29/2014   Tremor 12/08/2013   Personal history of breast cancer 09/06/2013   Dense breast 09/06/2013   Malignant neoplasm of lower-outer quadrant of right breast of female, estrogen receptor positive (Kellie Humphrey) 02/11/2012   Severe mitral valve regurgitation 05/08/2009   PREMATURE ATRIAL CONTRACTIONS 05/08/2009    PCP: Ginger Organ, MD  REFERRING PROVIDER: Ginger Organ, MD  REFERRING DIAG: M54.6 (ICD-10-CM) - Pain in thoracic spine   Rationale for Evaluation and Treatment Rehabilitation  THERAPY DIAG:  Abnormal posture  Muscle weakness (generalized)  Pain in thoracic spine  ONSET DATE: Few months  SUBJECTIVE:                                                                                                                                                                                           SUBJECTIVE STATEMENT: Kellie Humphrey has had spine issues for years.  Worsening recently.  "Tingling across the upper back and shoulder blades."  "Burning ache."  Worst later in the day.  PERTINENT HISTORY:  OA, hypothyroid, neuropathy, restless leg syndrome, previous cervical and lumbar radiculopathy, vericose veins R LE, previous C5-6 fusion, L shoulder scope 2016, see cardiac history in PMH  PAIN:  Are you having pain? Yes: NPRS scale: 0-6/10 Pain location: Across shoulders, mid-scapular Pain description: Starts with a tingle, then sore, achy builds up to a burn Aggravating factors: Activities later in the day, flexed postures Relieving factors: Heat, tylenol   PRECAUTIONS: Back  WEIGHT BEARING RESTRICTIONS: No  FALLS:  Has patient fallen in last 6 months? No  LIVING ENVIRONMENT: Lives with: lives with their spouse Lives in: House/apartment Stairs:  Does not avoid Has following equipment at home: None  OCCUPATION: Retired  PLOF: Independent  PATIENT GOALS: [place], book club, reading, social activities   OBJECTIVE:   DIAGNOSTIC FINDINGS:   IMPRESSION: The central canal is widely patent at all levels. Spondylosis results in mild right foraminal narrowing at C4-5 and left foraminal narrowing at C6-7.   Status post solid C5-6 fusion. The central canal and foramina are widely patent.  IMPRESSION:  MRI lumbar spine without contrast demonstrating: - At L5-S1: Disc bulging, facet and ligamentum flavum hypertrophy with moderate bilateral foraminal stenosis and lateral recess stenosis - At L2-3: Disc bulging and facet hypertrophy with mild bilateral foraminal stenosis.  PATIENT SURVEYS:  FOTO 65 (risk adjusted 53) Goal 73 in 10 visits  COGNITION:  Overall cognitive status: Within functional limits for tasks assessed     SENSATION: No peripheral tingling or numbness noted  POSTURE: rounded shoulders, forward head, and decreased lumbar lordosis  LUMBAR & CERVICAL ROM:   AROM eval  Cervical extension 50  Lumbar Extension 10  Right trunk lateral flexion 25  Left trunk lateral flexion 20  Right cervical rotation 45  Left cervical rotation 35   (Blank rows = not tested)   Spine Strength:    MMT Right eval Left eval  Hip flexion    Hip extension    Hip abduction    Hip adduction    Hip internal rotation    Hip external rotation    Knee flexion    Cervical extension 10.3 pounds (Goal 27)   Prone "superwoman" strength test Unable to lift knees or elbows off table (30-60 seconds Goal)   Ankle plantarflexion    Ankle inversion    Ankle eversion     (Blank rows = not tested)   TODAY'S TREATMENT:  [facility] Adult PT Treatment:                                                                                                                            DATE: 07/10/2022 Shoulder blade pinches 10X 5 seconds Cervical Isometrics Extension 10X 5 seconds Pull to chest/Rows with Blue theraband 10X 3 seconds  Functional Activities: Reviewed imaging, spine anatomy, basic body mechanics with reading, computers, basic postural  awareness, exam findings and starter HEP   PATIENT EDUCATION:  Education details: See above Person educated: Patient Education method: Consulting civil engineer, Demonstration, Tactile cues, Verbal cues, and Handouts Education comprehension: verbalized understanding, returned demonstration, verbal cues required, tactile cues required, and needs further education   HOME EXERCISE PROGRAM: Access Code: [email or web address] URL: [email or web address] Date: 07/10/2022 Prepared by: Vista Mink  Exercises - Standing Scapular Retraction  - 5 x daily - 7 x weekly - 1 sets - 5 reps - 5 second hold - Standing Row with Anchored Resistance Band with PLB  - 1 x daily - 1 x weekly - 1-2 sets - 15-20 reps - 3 seconds hold - Standing Isometric Cervical Extension with Manual Resistance  - 3-5 x daily - 7 x weekly - 1 sets - 5 reps - 5 hold  ASSESSMENT:  CLINICAL IMPRESSION: Patient is a 76 y.o. female who was seen today for physical therapy evaluation and treatment for thoracic spine pain.  Marylyn has weak postural muscles and this is consistent with her complaints of pain later in the day with fatigue.  She will benefit from general spine (cervical and lumbar extensor), scapular and postural strengthening and awareness.  Her prognosis is excellent to meet the below listed goals.   OBJECTIVE IMPAIRMENTS: decreased activity tolerance, decreased endurance, decreased knowledge of condition, decreased strength, decreased safety awareness, impaired perceived functional ability, improper body mechanics, postural dysfunction, and pain.   ACTIVITY LIMITATIONS: carrying, lifting, and activities later in the day  PARTICIPATION LIMITATIONS: community activity  PERSONAL FACTORS: OA, hypothyroid, neuropathy, restless leg syndrome, previous cervical and lumbar radiculopathy, vericose veins R LE, previous C5-6 fusion, L shoulder scope 2016, see cardiac history in PMH are also affecting patient's functional outcome.   REHAB  POTENTIAL: Good  CLINICAL DECISION MAKING: Stable/uncomplicated  EVALUATION COMPLEXITY: Low   GOALS: Goals reviewed with patient? Yes  SHORT TERM GOALS: Target date: 08/07/2022  Improve cervical extensor strength to a minimum of 20 pounds Baseline: 10 pounds Goal status: INITIAL  2.  Jaeleigh will be able to maintain the spine strength test position for 15 seconds Baseline: Unable to lift elbows or knees off table Goal status: INITIAL   LONG TERM GOALS: Target date: 08/21/2022  Improve FOTO to 73 Baseline: 65 (risk adjusted 53) Goal status: INITIAL  2.  Yennifer will report thoracic spine pain consistently 0-2/10 on the Numeric Pain Rating Scale Baseline: Can be 6/10 Goal status: INITIAL  3.  Improve Bil cervical rotation AROM to 45 degrees Baseline: 35-45 degrees Goal status: INITIAL  4.  Improve lumbar extension AROM to 15 degrees Baseline: 10 degrees Goal status: INITIAL  5.  Improve cervical extension strength to 27 pounds and spine strength to 30-60 seconds as assessed by the "superwoman" spine strength test Baseline: See STG Goal status: INITIAL  6.  Karry will be independent with her long-term HEP at DC Baseline: Started 07/10/2022 Goal status: INITIAL   PLAN: PT FREQUENCY: 1-2x/week  PT DURATION: 6 weeks  PLANNED INTERVENTIONS: Therapeutic exercises, Therapeutic activity, Neuromuscular re-education, Patient/Family education, Self Care, and Dry Needling.  PLAN FOR NEXT SESSION: Review HEP, progress scapular theraband strengthening, start prone scapular strengthening, UBE pull-only, practical posture and body mechanics   Farley Ly, PT, MPT 07/10/2022, 3:59 PM

## 2022-07-15 NOTE — Therapy (Signed)
OUTPATIENT PHYSICAL THERAPY TREATMENT NOTE   Patient Name: Kellie Humphrey MRN: [id number] DOB:06/15/1946, 76 y.o., female Today's Date: 07/16/2022  END OF SESSION:   PT End of Session - 07/16/22 0930     Visit Number 2    Number of Visits 10    Date for PT Re-Evaluation 08/22/22    PT Start Time 0928    PT Stop Time 1006    PT Time Calculation (min) 38 min    Activity Tolerance Patient tolerated treatment well;No increased pain    Behavior During Therapy WFL for tasks assessed/performed             Past Medical History:  Diagnosis Date   Anxiety disorder    Arthritis    Cataract    Essential tremor    /familial   Heart murmur 2021   corrected with valve surgery   Hypothyroidism    Interstitial cystitis    Malignant neoplasm of lower-outer quadrant of right breast of female, estrogen receptor positive ([place]) 02/11/2012   Neuropathy    Nonrheumatic mitral valve regurgitation    PAC (premature atrial contraction)    Paresthesias 07/29/2014   PREMATURE ATRIAL CONTRACTIONS 05/08/2009   Qualifier: Diagnosis of  By: [name], RN, [name]     Radiculopathy, cervical region    RLS (restless legs syndrome)    S/P minimally-invasive mitral valve repair 02/10/2020   Complex valvuloplasty including artificial Gore-tex neochord placement x8 with edge-to-edge suture plication of anterior commissure and 32 mm [name] 4D ring annuloplasty via right mini thoracotomy approach   Thyroid disease    Varicose veins of right lower extremity with complications 07/37/1062   Past Surgical History:  Procedure Laterality Date   bilateral cataract repair  july/aug 2021   Dr Tommy Rainwater   BREAST BIOPSY  2009   BREAST LUMPECTOMY  2010   [place]  2021   [place]     age 32   EYE SURGERY Left    Pterygium   HEMANGIOMA EXCISION Left 1990   arm   MELANOMA EXCISION Right 1980   thigh   MITRAL VALVE REPAIR Right 02/10/2020   Procedure: MINIMALLY  INVASIVE MITRAL VALVE REPAIR (MVR) using Memo 4D 32 MM Mitral Valve Ring.;  Surgeon: Rexene Alberts, MD;  Location: [place];  Service: Open Heart Surgery;  Laterality: Right;   NECK SURGERY  10/2009   C5-6 fusion   RIGHT/LEFT HEART CATH AND CORONARY ANGIOGRAPHY N/A 01/04/2020   Procedure: RIGHT/LEFT HEART CATH AND CORONARY ANGIOGRAPHY;  Surgeon: Burnell Blanks, MD;  Location: [place] [facility];  Service: Cardiovascular;  Laterality: N/A;   ROBOTIC ASSISTED BILATERAL SALPINGO OOPHERECTOMY Bilateral 10/01/2021   Procedure: XI ROBOTIC ASSISTED BILATERAL SALPINGO OOPHORECTOMY;  Surgeon: Lafonda Mosses, MD;  Location: [facility];  Service: Gynecology;  Laterality: Bilateral;   SHOULDER ARTHROSCOPY Left 2016   spider vein treatment     TEE WITHOUT CARDIOVERSION N/A 06/30/2018   Procedure: TRANSESOPHAGEAL ECHOCARDIOGRAM (TEE);  Surgeon: Sueanne Margarita, MD;  Location: [facility] ENDOSCOPY;  Service: Cardiovascular;  Laterality: N/A;   TEE WITHOUT CARDIOVERSION N/A 12/12/2019   Procedure: TRANSESOPHAGEAL ECHOCARDIOGRAM (TEE);  Surgeon: Dorothy Spark, MD;  Location: [facility] ENDOSCOPY;  Service: Cardiovascular;  Laterality: N/A;   TEE WITHOUT CARDIOVERSION N/A 02/10/2020   Procedure: TRANSESOPHAGEAL ECHOCARDIOGRAM (TEE);  Surgeon: Rexene Alberts, MD;  Location: [place];  Service: Open Heart Surgery;  Laterality: N/A;   TONSILLECTOMY     as a child  uterine fibroids removed  2012   Patient Active Problem List   Diagnosis Date Noted   PVC (premature ventricular contraction) 06/05/2022   Adnexal mass    Cyst of right ovary 08/21/2021   Weight loss 08/21/2021   Nausea without vomiting 08/21/2021   Encounter for removal of sutures 02/23/2020   Encounter for therapeutic drug monitoring 02/20/2020   Atrial fibrillation ([place]) 02/20/2020   S/P minimally-invasive mitral valve repair 02/10/2020   Nonrheumatic mitral valve regurgitation    Varicose veins of right lower extremity with complications  16/06/9603   Paresthesias 07/29/2014   Tremor 12/08/2013   Personal history of breast cancer 09/06/2013   Dense breast 09/06/2013   Malignant neoplasm of lower-outer quadrant of right breast of female, estrogen receptor positive ([place]) 02/11/2012   Severe mitral valve regurgitation 05/08/2009   PREMATURE ATRIAL CONTRACTIONS 05/08/2009     THERAPY DIAG:  Abnormal posture  Muscle weakness (generalized)  Pain in thoracic spine   PCP: Ginger Organ, MD  REFERRING PROVIDER: Ginger Organ, MD  REFERRING DIAG: M54.6 (ICD-10-CM) - Pain in thoracic spine   Rationale for Evaluation and Treatment Rehabilitation  EVAL THERAPY DIAG:  Abnormal posture  Muscle weakness (generalized)  Pain in thoracic spine  ONSET DATE: Few months  SUBJECTIVE:                                                                                                                                                                                           SUBJECTIVE STATEMENT: Doing exercises, but hasn't noticed a huge change in symptoms yet  PERTINENT HISTORY:  OA, hypothyroid, neuropathy, restless leg syndrome, previous cervical and lumbar radiculopathy, vericose veins R LE, previous C5-6 fusion, L shoulder scope 2016, see cardiac history in PMH  PAIN:  Are you having pain? Yes: NPRS scale: 0-6/10 Pain location: Across shoulders, mid-scapular Pain description: Starts with a tingle, then sore, achy builds up to a burn Aggravating factors: Activities later in the day, flexed postures Relieving factors: Heat, tylenol   PRECAUTIONS: Back  WEIGHT BEARING RESTRICTIONS: No  FALLS:  Has patient fallen in last 6 months? No  LIVING ENVIRONMENT: Lives with: lives with their spouse Lives in: House/apartment Stairs: Does not avoid Has following equipment at home: None  OCCUPATION: Retired  PLOF: Independent  PATIENT GOALS: [place], book club, reading, social activities   OBJECTIVE:    DIAGNOSTIC FINDINGS:  IMPRESSION: The central canal is widely patent at all levels. Spondylosis results in mild right foraminal narrowing at C4-5 and left foraminal narrowing at C6-7.   Status post solid C5-6 fusion. The central canal and foramina are  widely patent.  IMPRESSION:    MRI lumbar spine without contrast demonstrating: - At L5-S1: Disc bulging, facet and ligamentum flavum hypertrophy with moderate bilateral foraminal stenosis and lateral recess stenosis - At L2-3: Disc bulging and facet hypertrophy with mild bilateral foraminal stenosis.  PATIENT SURVEYS:  EVAL: FOTO 65 (risk adjusted 53) Goal 73 in 10 visits  COGNITION: Overall cognitive status: Within functional limits for tasks assessed     SENSATION: No peripheral tingling or numbness noted  POSTURE: rounded shoulders, forward head, and decreased lumbar lordosis  LUMBAR & CERVICAL ROM:   AROM eval  Cervical extension 50  Lumbar Extension 10  Right trunk lateral flexion 25  Left trunk lateral flexion 20  Right cervical rotation 45  Left cervical rotation 35   (Blank rows = not tested)   Spine Strength:    MMT Right eval Left eval  Hip flexion    Hip extension    Hip abduction    Hip adduction    Hip internal rotation    Hip external rotation    Knee flexion    Cervical extension 10.3 pounds (Goal 27)   Prone "superwoman" strength test Unable to lift knees or elbows off table (30-60 seconds Goal)   Ankle plantarflexion    Ankle inversion    Ankle eversion     (Blank rows = not tested)   TODAY'S TREATMENT:  07/16/22 Therex UBE L3.0 backward x 3 min Rows L3 band 2x10; 5 sec hold Supine extension isometrics 10 x 5 sec hold Supine horizontal abduction with L3 band x 10 reps Supine overhead pull with L3 band x 10 reps Supine ER with L3 band x 10 reps Supine scapular protraction 3# x 10 reps bil Bent over row with 3# bil x 10 reps Bent over flys x10 with 3# bil Bent over shoulder  extension 3# bil x 10 reps Shoulder taps  at counter x 10 reps alternating   [facility] Adult PT Treatment:                                                                                                                            DATE: 07/10/2022 Shoulder blade pinches 10X 5 seconds Cervical Isometrics Extension 10X 5 seconds Pull to chest/Rows with Blue theraband 10X 3 seconds  Functional Activities: Reviewed imaging, spine anatomy, basic body mechanics with reading, computers, basic postural awareness, exam findings and starter HEP   PATIENT EDUCATION:  Education details: See above Person educated: Patient Education method: Consulting civil engineer, Demonstration, Tactile cues, Verbal cues, and Handouts Education comprehension: verbalized understanding, returned demonstration, verbal cues required, tactile cues required, and needs further education   HOME EXERCISE PROGRAM: Access Code: [email or web address] URL: [email or web address] Date: 07/10/2022 Prepared by: Vista Mink  Exercises - Standing Scapular Retraction  - 5 x daily - 7 x weekly - 1 sets - 5 reps - 5 second hold - Standing Row with Anchored Resistance Band with PLB  - 1 x daily - 1 x  weekly - 1-2 sets - 15-20 reps - 3 seconds hold - Standing Isometric Cervical Extension with Manual Resistance  - 3-5 x daily - 7 x weekly - 1 sets - 5 reps - 5 hold  ASSESSMENT:  CLINICAL IMPRESSION: Pt demonstrating good understanding and compliance with HEP, and plan to progress next session.  Tolerated strengthening exercises well today without difficulty.  Deferred prone exercises due to pt discomfort so will plan to modify PRN going forward.  Will continue to benefit from PT to maximize function.  OBJECTIVE IMPAIRMENTS: decreased activity tolerance, decreased endurance, decreased knowledge of condition, decreased strength, decreased safety awareness, impaired perceived functional ability, improper body mechanics, postural dysfunction, and pain.    ACTIVITY LIMITATIONS: carrying, lifting, and activities later in the day  PARTICIPATION LIMITATIONS: community activity  PERSONAL FACTORS: OA, hypothyroid, neuropathy, restless leg syndrome, previous cervical and lumbar radiculopathy, vericose veins R LE, previous C5-6 fusion, L shoulder scope 2016, see cardiac history in PMH are also affecting patient's functional outcome.   REHAB POTENTIAL: Good  CLINICAL DECISION MAKING: Stable/uncomplicated  EVALUATION COMPLEXITY: Low   GOALS: Goals reviewed with patient? Yes  SHORT TERM GOALS: Target date: 08/07/2022  Improve cervical extensor strength to a minimum of 20 pounds Baseline: 10 pounds Goal status: INITIAL  2.  Sanya will be able to maintain the spine strength test position for 15 seconds Baseline: Unable to lift elbows or knees off table Goal status: INITIAL   LONG TERM GOALS: Target date: 08/21/2022  Improve FOTO to 73 Baseline: 65 (risk adjusted 53) Goal status: INITIAL  2.  Claire will report thoracic spine pain consistently 0-2/10 on the Numeric Pain Rating Scale Baseline: Can be 6/10 Goal status: INITIAL  3.  Improve Bil cervical rotation AROM to 45 degrees Baseline: 35-45 degrees Goal status: INITIAL  4.  Improve lumbar extension AROM to 15 degrees Baseline: 10 degrees Goal status: INITIAL  5.  Improve cervical extension strength to 27 pounds and spine strength to 30-60 seconds as assessed by the "superwoman" spine strength test Baseline: See STG Goal status: INITIAL  6.  Mazella will be independent with her long-term HEP at DC Baseline: Started 07/10/2022 Goal status: INITIAL   PLAN: PT FREQUENCY: 1-2x/week  PT DURATION: 6 weeks  PLANNED INTERVENTIONS: Therapeutic exercises, Therapeutic activity, Neuromuscular re-education, Patient/Family education, Self Care, and Dry Needling.  PLAN FOR NEXT SESSION:  Review/update HEP PRN, progress scapular theraband strengthening,  scapular strengthening  (supine, quadruped, standing as able - pt doesn't like prone), UBE pull-only, practical posture and body mechanics    Laureen Abrahams, PT, DPT 07/16/22 10:07 AM

## 2022-07-16 ENCOUNTER — Ambulatory Visit (INDEPENDENT_AMBULATORY_CARE_PROVIDER_SITE_OTHER): Payer: PPO | Admitting: Physical Therapy

## 2022-07-16 ENCOUNTER — Encounter: Payer: Self-pay | Admitting: Physical Therapy

## 2022-07-16 DIAGNOSIS — M546 Pain in thoracic spine: Secondary | ICD-10-CM

## 2022-07-16 DIAGNOSIS — R293 Abnormal posture: Secondary | ICD-10-CM

## 2022-07-16 DIAGNOSIS — M6281 Muscle weakness (generalized): Secondary | ICD-10-CM | POA: Diagnosis not present

## 2022-07-19 DIAGNOSIS — Z23 Encounter for immunization: Secondary | ICD-10-CM | POA: Diagnosis not present

## 2022-07-22 ENCOUNTER — Encounter: Payer: Self-pay | Admitting: Physical Therapy

## 2022-07-22 ENCOUNTER — Ambulatory Visit: Payer: PPO | Admitting: Physical Therapy

## 2022-07-22 DIAGNOSIS — R293 Abnormal posture: Secondary | ICD-10-CM

## 2022-07-22 DIAGNOSIS — M6281 Muscle weakness (generalized): Secondary | ICD-10-CM

## 2022-07-22 DIAGNOSIS — M546 Pain in thoracic spine: Secondary | ICD-10-CM | POA: Diagnosis not present

## 2022-07-22 NOTE — Therapy (Signed)
OUTPATIENT PHYSICAL THERAPY TREATMENT NOTE   Patient Name: Livana Yerian MRN: [id number] DOB:1946/03/03, 76 y.o., female Today's Date: 07/22/2022  END OF SESSION:   PT End of Session - 07/22/22 0929     Visit Number 3    Number of Visits 10    Date for PT Re-Evaluation 08/22/22    PT Start Time 0929    PT Stop Time 4098    PT Time Calculation (min) 39 min    Activity Tolerance Patient tolerated treatment well;No increased pain    Behavior During Therapy WFL for tasks assessed/performed              Past Medical History:  Diagnosis Date   Anxiety disorder    Arthritis    Cataract    Essential tremor    /familial   Heart murmur 2021   corrected with valve surgery   Hypothyroidism    Interstitial cystitis    Malignant neoplasm of lower-outer quadrant of right breast of female, estrogen receptor positive ([place]) 02/11/2012   Neuropathy    Nonrheumatic mitral valve regurgitation    PAC (premature atrial contraction)    Paresthesias 07/29/2014   PREMATURE ATRIAL CONTRACTIONS 05/08/2009   Qualifier: Diagnosis of  By: [name], RN, [name]     Radiculopathy, cervical region    RLS (restless legs syndrome)    S/P minimally-invasive mitral valve repair 02/10/2020   Complex valvuloplasty including artificial Gore-tex neochord placement x8 with edge-to-edge suture plication of anterior commissure and 32 mm [name] 4D ring annuloplasty via right mini thoracotomy approach   Thyroid disease    Varicose veins of right lower extremity with complications 11/91/4782   Past Surgical History:  Procedure Laterality Date   bilateral cataract repair  july/aug 2021   Dr Tommy Rainwater   BREAST BIOPSY  2009   BREAST LUMPECTOMY  2010   [place]  2021   [place]     age 39   EYE SURGERY Left    Pterygium   HEMANGIOMA EXCISION Left 1990   arm   MELANOMA EXCISION Right 1980   thigh   MITRAL VALVE REPAIR Right 02/10/2020   Procedure: MINIMALLY  INVASIVE MITRAL VALVE REPAIR (MVR) using Memo 4D 32 MM Mitral Valve Ring.;  Surgeon: Rexene Alberts, MD;  Location: [place];  Service: Open Heart Surgery;  Laterality: Right;   NECK SURGERY  10/2009   C5-6 fusion   RIGHT/LEFT HEART CATH AND CORONARY ANGIOGRAPHY N/A 01/04/2020   Procedure: RIGHT/LEFT HEART CATH AND CORONARY ANGIOGRAPHY;  Surgeon: Burnell Blanks, MD;  Location: [place] [facility];  Service: Cardiovascular;  Laterality: N/A;   ROBOTIC ASSISTED BILATERAL SALPINGO OOPHERECTOMY Bilateral 10/01/2021   Procedure: XI ROBOTIC ASSISTED BILATERAL SALPINGO OOPHORECTOMY;  Surgeon: Lafonda Mosses, MD;  [facility];  Service: Gynecology;  Laterality: Bilateral;   SHOULDER ARTHROSCOPY Left 2016   spider vein treatment     TEE WITHOUT CARDIOVERSION N/A 06/30/2018   Procedure: TRANSESOPHAGEAL ECHOCARDIOGRAM (TEE);  Surgeon: Sueanne Margarita, MD;  Location: [facility] ENDOSCOPY;  Service: Cardiovascular;  Laterality: N/A;   TEE WITHOUT CARDIOVERSION N/A 12/12/2019   Procedure: TRANSESOPHAGEAL ECHOCARDIOGRAM (TEE);  Surgeon: Dorothy Spark, MD;  Location: [facility] ENDOSCOPY;  Service: Cardiovascular;  Laterality: N/A;   TEE WITHOUT CARDIOVERSION N/A 02/10/2020   Procedure: TRANSESOPHAGEAL ECHOCARDIOGRAM (TEE);  Surgeon: Rexene Alberts, MD;  Location: [place];  Service: Open Heart Surgery;  Laterality: N/A;   TONSILLECTOMY     as a child  uterine fibroids removed  2012   Patient Active Problem List   Diagnosis Date Noted   PVC (premature ventricular contraction) 06/05/2022   Adnexal mass    Cyst of right ovary 08/21/2021   Weight loss 08/21/2021   Nausea without vomiting 08/21/2021   Encounter for removal of sutures 02/23/2020   Encounter for therapeutic drug monitoring 02/20/2020   Atrial fibrillation ([place]) 02/20/2020   S/P minimally-invasive mitral valve repair 02/10/2020   Nonrheumatic mitral valve regurgitation    Varicose veins of right lower extremity with complications  54/65/0354   Paresthesias 07/29/2014   Tremor 12/08/2013   Personal history of breast cancer 09/06/2013   Dense breast 09/06/2013   Malignant neoplasm of lower-outer quadrant of right breast of female, estrogen receptor positive ([place]) 02/11/2012   Severe mitral valve regurgitation 05/08/2009   PREMATURE ATRIAL CONTRACTIONS 05/08/2009     THERAPY DIAG:  Abnormal posture  Muscle weakness (generalized)  Pain in thoracic spine   PCP: Ginger Organ, MD  REFERRING PROVIDER: Ginger Organ, MD  REFERRING DIAG: M54.6 (ICD-10-CM) - Pain in thoracic spine   Rationale for Evaluation and Treatment Rehabilitation  EVAL THERAPY DIAG:  Abnormal posture  Muscle weakness (generalized)  Pain in thoracic spine  ONSET DATE: Few months  SUBJECTIVE:                                                                                                                                                                                           SUBJECTIVE STATEMENT: Was pretty sore after last session  PERTINENT HISTORY:  OA, hypothyroid, neuropathy, restless leg syndrome, previous cervical and lumbar radiculopathy, vericose veins R LE, previous C5-6 fusion, L shoulder scope 2016, see cardiac history in PMH  PAIN:  Are you having pain? Yes: NPRS scale: 3/10 Pain location: Across shoulders, mid-scapular Pain description: Starts with a tingle, then sore, achy builds up to a burn Aggravating factors: Activities later in the day, flexed postures Relieving factors: Heat, tylenol   PRECAUTIONS: Back  WEIGHT BEARING RESTRICTIONS: No  FALLS:  Has patient fallen in last 6 months? No  LIVING ENVIRONMENT: Lives with: lives with their spouse Lives in: House/apartment Stairs: Does not avoid Has following equipment at home: None  OCCUPATION: Retired  PLOF: Independent  PATIENT GOALS: [place], book club, reading, social activities   OBJECTIVE:   DIAGNOSTIC FINDINGS:   IMPRESSION: The central canal is widely patent at all levels. Spondylosis results in mild right foraminal narrowing at C4-5 and left foraminal narrowing at C6-7.   Status post solid C5-6 fusion. The central canal and foramina are widely patent.  IMPRESSION:  MRI lumbar spine without contrast demonstrating: - At L5-S1: Disc bulging, facet and ligamentum flavum hypertrophy with moderate bilateral foraminal stenosis and lateral recess stenosis - At L2-3: Disc bulging and facet hypertrophy with mild bilateral foraminal stenosis.  PATIENT SURVEYS:  EVAL: FOTO 65 (risk adjusted 53) Goal 73 in 10 visits  COGNITION: Overall cognitive status: Within functional limits for tasks assessed     SENSATION: No peripheral tingling or numbness noted  POSTURE: rounded shoulders, forward head, and decreased lumbar lordosis  LUMBAR & CERVICAL ROM:   AROM eval  Cervical extension 50  Lumbar Extension 10  Right trunk lateral flexion 25  Left trunk lateral flexion 20  Right cervical rotation 45  Left cervical rotation 35   (Blank rows = not tested)   Spine Strength:    MMT Right eval Left eval  Hip flexion    Hip extension    Hip abduction    Hip adduction    Hip internal rotation    Hip external rotation    Knee flexion    Cervical extension 10.3 pounds (Goal 27)   Prone "superwoman" strength test Unable to lift knees or elbows off table (30-60 seconds Goal)   Ankle plantarflexion    Ankle inversion    Ankle eversion     (Blank rows = not tested)   TODAY'S TREATMENT:  07/22/22 Therex UBE L3.0 backward x 3 min Rows L4 band x 10 reps Standing ER with scap retraction L4 band x 10 reps Standing horizontal abduction L4 band x 10 reps Standing lat pull with L4 band x 10 reps Rows 15# 2x10 Seated thoracic extension 10 x 10 sec hold with towel roll  Manual STM with palpation of soft tissue to assess for active trigger points - she has trigger points but no reproduction of  pain.  Will consider DN in the future if indicated.  07/16/22 Therex UBE L3.0 backward x 3 min Rows L3 band 2x10; 5 sec hold Supine extension isometrics 10 x 5 sec hold Supine horizontal abduction with L3 band x 10 reps Supine overhead pull with L3 band x 10 reps Supine ER with L3 band x 10 reps Supine scapular protraction 3# x 10 reps bil Bent over row with 3# bil x 10 reps Bent over flys x10 with 3# bil Bent over shoulder extension 3# bil x 10 reps Shoulder taps at counter x 10 reps alternating   [facility] Adult PT Treatment:                                                                                                                            DATE: 07/10/2022 Shoulder blade pinches 10X 5 seconds Cervical Isometrics Extension 10X 5 seconds Pull to chest/Rows with Blue theraband 10X 3 seconds  Functional Activities: Reviewed imaging, spine anatomy, basic body mechanics with reading, computers, basic postural awareness, exam findings and starter HEP   PATIENT EDUCATION:  Education details: See above Person educated: Patient Education method: Explanation,  Demonstration, Tactile cues, Verbal cues, and Handouts Education comprehension: verbalized understanding, returned demonstration, verbal cues required, tactile cues required, and needs further education   HOME EXERCISE PROGRAM: Access Code: [email or web address] URL: [email or web address] Date: 07/22/2022 Prepared by: Faustino Congress  Exercises - Standing Scapular Retraction  - 5 x daily - 7 x weekly - 1 sets - 5 reps - 5 second hold - Standing Row with Anchored Resistance Band with PLB  - 1 x daily - 1 x weekly - 1-2 sets - 15-20 reps - 3 seconds hold - Standing Isometric Cervical Extension with Manual Resistance  - 3-5 x daily - 7 x weekly - 1 sets - 5 reps - 5 hold - Seated Thoracic Lumbar Extension  - 2-3 x daily - 7 x weekly - 1 sets - 10 reps - 10 sec hold  ASSESSMENT:  CLINICAL IMPRESSION: Pt tolerated session  well today with continued focus on mid back strengthening, as well as some extension based exercises due to radicular symptoms.  Will continue to benefit from PT to maximize function.  OBJECTIVE IMPAIRMENTS: decreased activity tolerance, decreased endurance, decreased knowledge of condition, decreased strength, decreased safety awareness, impaired perceived functional ability, improper body mechanics, postural dysfunction, and pain.   ACTIVITY LIMITATIONS: carrying, lifting, and activities later in the day  PARTICIPATION LIMITATIONS: community activity  PERSONAL FACTORS: OA, hypothyroid, neuropathy, restless leg syndrome, previous cervical and lumbar radiculopathy, vericose veins R LE, previous C5-6 fusion, L shoulder scope 2016, see cardiac history in PMH are also affecting patient's functional outcome.   REHAB POTENTIAL: Good  CLINICAL DECISION MAKING: Stable/uncomplicated  EVALUATION COMPLEXITY: Low   GOALS: Goals reviewed with patient? Yes  SHORT TERM GOALS: Target date: 08/07/2022  Improve cervical extensor strength to a minimum of 20 pounds Baseline: 10 pounds Goal status: INITIAL  2.  Haevyn will be able to maintain the spine strength test position for 15 seconds Baseline: Unable to lift elbows or knees off table Goal status: INITIAL   LONG TERM GOALS: Target date: 08/21/2022  Improve FOTO to 73 Baseline: 65 (risk adjusted 53) Goal status: INITIAL  2.  Evoleht will report thoracic spine pain consistently 0-2/10 on the Numeric Pain Rating Scale Baseline: Can be 6/10 Goal status: INITIAL  3.  Improve Bil cervical rotation AROM to 45 degrees Baseline: 35-45 degrees Goal status: INITIAL  4.  Improve lumbar extension AROM to 15 degrees Baseline: 10 degrees Goal status: INITIAL  5.  Improve cervical extension strength to 27 pounds and spine strength to 30-60 seconds as assessed by the "superwoman" spine strength test Baseline: See STG Goal status: INITIAL  6.   Ria will be independent with her long-term HEP at DC Baseline: Started 07/10/2022 Goal status: INITIAL   PLAN: PT FREQUENCY: 1-2x/week  PT DURATION: 6 weeks  PLANNED INTERVENTIONS: Therapeutic exercises, Therapeutic activity, Neuromuscular re-education, Patient/Family education, Self Care, and Dry Needling.  PLAN FOR NEXT SESSION:  see how thoracic extension is going (is it helping?) Review/update HEP PRN, progress scapular theraband strengthening,  scapular strengthening (supine, quadruped, standing as able - pt doesn't like prone), UBE pull-only, practical posture and body mechanics    Laureen Abrahams, PT, DPT 07/22/22 11:54 AM

## 2022-07-30 ENCOUNTER — Ambulatory Visit: Payer: PPO | Admitting: Physical Therapy

## 2022-07-30 ENCOUNTER — Encounter: Payer: Self-pay | Admitting: Physical Therapy

## 2022-07-30 DIAGNOSIS — M546 Pain in thoracic spine: Secondary | ICD-10-CM

## 2022-07-30 DIAGNOSIS — R293 Abnormal posture: Secondary | ICD-10-CM | POA: Diagnosis not present

## 2022-07-30 DIAGNOSIS — M6281 Muscle weakness (generalized): Secondary | ICD-10-CM | POA: Diagnosis not present

## 2022-07-30 NOTE — Therapy (Signed)
OUTPATIENT PHYSICAL THERAPY TREATMENT NOTE   Patient Name: Madison Direnzo MRN: [id number] DOB:[birth year], 76 y.o., female Today's Date: 07/30/2022  END OF SESSION:   PT End of Session - 07/30/22 1019     Visit Number 4    Number of Visits 10    Date for PT Re-Evaluation 08/22/22    PT Start Time 1020    PT Stop Time 1055    PT Time Calculation (min) 35 min    Activity Tolerance Patient tolerated treatment well;No increased pain    Behavior During Therapy WFL for tasks assessed/performed               Past Medical History:  Diagnosis Date   Anxiety disorder    Arthritis    Cataract    Essential tremor    /familial   Heart murmur 2021   corrected with valve surgery   Hypothyroidism    Interstitial cystitis    Malignant neoplasm of lower-outer quadrant of right breast of female, estrogen receptor positive ([place]) 02/11/2012   Neuropathy    Nonrheumatic mitral valve regurgitation    PAC (premature atrial contraction)    Paresthesias 07/29/2014   PREMATURE ATRIAL CONTRACTIONS 05/08/2009   Qualifier: Diagnosis of  By: [name], RN, [name]     Radiculopathy, cervical region    RLS (restless legs syndrome)    S/P minimally-invasive mitral valve repair 02/10/2020   Complex valvuloplasty including artificial Gore-tex neochord placement x8 with edge-to-edge suture plication of anterior commissure and 32 mm [name] 4D ring annuloplasty via right mini thoracotomy approach   Thyroid disease    Varicose veins of right lower extremity with complications 46/96/2952   Past Surgical History:  Procedure Laterality Date   bilateral cataract repair  july/aug 2021   Dr Tommy Rainwater   BREAST BIOPSY  2009   BREAST LUMPECTOMY  2010   [place]  2021   [place]     age 24   EYE SURGERY Left    Pterygium   HEMANGIOMA EXCISION Left 1990   arm   MELANOMA EXCISION Right 1980   thigh   MITRAL VALVE REPAIR Right 02/10/2020   Procedure: MINIMALLY  INVASIVE MITRAL VALVE REPAIR (MVR) using Memo 4D 32 MM Mitral Valve Ring.;  Surgeon: Rexene Alberts, MD;  Location: [place];  Service: Open Heart Surgery;  Laterality: Right;   NECK SURGERY  10/2009   C5-6 fusion   RIGHT/LEFT HEART CATH AND CORONARY ANGIOGRAPHY N/A 01/04/2020   Procedure: RIGHT/LEFT HEART CATH AND CORONARY ANGIOGRAPHY;  Surgeon: Burnell Blanks, MD;  Location: [place] [facility];  Service: Cardiovascular;  Laterality: N/A;   ROBOTIC ASSISTED BILATERAL SALPINGO OOPHERECTOMY Bilateral 10/01/2021   Procedure: XI ROBOTIC ASSISTED BILATERAL SALPINGO OOPHORECTOMY;  Surgeon: Lafonda Mosses, MD;  Location: [facility];  Service: Gynecology;  Laterality: Bilateral;   SHOULDER ARTHROSCOPY Left 2016   spider vein treatment     TEE WITHOUT CARDIOVERSION N/A 06/30/2018   Procedure: TRANSESOPHAGEAL ECHOCARDIOGRAM (TEE);  Surgeon: Sueanne Margarita, MD;  Location: [facility] ENDOSCOPY;  Service: Cardiovascular;  Laterality: N/A;   TEE WITHOUT CARDIOVERSION N/A 12/12/2019   Procedure: TRANSESOPHAGEAL ECHOCARDIOGRAM (TEE);  Surgeon: Dorothy Spark, MD;  Location: [facility] ENDOSCOPY;  Service: Cardiovascular;  Laterality: N/A;   TEE WITHOUT CARDIOVERSION N/A 02/10/2020   Procedure: TRANSESOPHAGEAL ECHOCARDIOGRAM (TEE);  Surgeon: Rexene Alberts, MD;  Location: [place];  Service: Open Heart Surgery;  Laterality: N/A;   TONSILLECTOMY     as a child  uterine fibroids removed  2012   Patient Active Problem List   Diagnosis Date Noted   PVC (premature ventricular contraction) 06/05/2022   Adnexal mass    Cyst of right ovary 08/21/2021   Weight loss 08/21/2021   Nausea without vomiting 08/21/2021   Encounter for removal of sutures 02/23/2020   Encounter for therapeutic drug monitoring 02/20/2020   Atrial fibrillation ([place]) 02/20/2020   S/P minimally-invasive mitral valve repair 02/10/2020   Nonrheumatic mitral valve regurgitation    Varicose veins of right lower extremity with complications  09/60/4540   Paresthesias 07/29/2014   Tremor 12/08/2013   Personal history of breast cancer 09/06/2013   Dense breast 09/06/2013   Malignant neoplasm of lower-outer quadrant of right breast of female, estrogen receptor positive ([place]) 02/11/2012   Severe mitral valve regurgitation 05/08/2009   PREMATURE ATRIAL CONTRACTIONS 05/08/2009     THERAPY DIAG:  Abnormal posture  Muscle weakness (generalized)  Pain in thoracic spine   PCP: Ginger Organ, MD  REFERRING PROVIDER: Ginger Organ, MD  REFERRING DIAG: M54.6 (ICD-10-CM) - Pain in thoracic spine   Rationale for Evaluation and Treatment Rehabilitation  EVAL THERAPY DIAG:  Abnormal posture  Muscle weakness (generalized)  Pain in thoracic spine  ONSET DATE: Few months  SUBJECTIVE:                                                                                                                                                                                           SUBJECTIVE STATEMENT: Reports she's "about the same."   Tingling is still bothersome, did try a little extension which helped but can't stay in that position.  PERTINENT HISTORY:  OA, hypothyroid, neuropathy, restless leg syndrome, previous cervical and lumbar radiculopathy, vericose veins R LE, previous C5-6 fusion, L shoulder scope 2016, see cardiac history in PMH  PAIN:  Are you having pain? Yes: NPRS scale: /10 Pain location: Across shoulders, mid-scapular Pain description: Starts with a tingle, then sore, achy builds up to a burn Aggravating factors: Activities later in the day, flexed postures Relieving factors: Heat, tylenol   PRECAUTIONS: Back  WEIGHT BEARING RESTRICTIONS: No  FALLS:  Has patient fallen in last 6 months? No  LIVING ENVIRONMENT: Lives with: lives with their spouse Lives in: House/apartment Stairs: Does not avoid Has following equipment at home: None  OCCUPATION: Retired  PLOF: Independent  PATIENT GOALS: [place], book club, reading, social activities   OBJECTIVE:   DIAGNOSTIC FINDINGS:  IMPRESSION: The central canal is widely patent at all levels. Spondylosis results in mild right foraminal narrowing at C4-5 and left foraminal narrowing at C6-7.  Status post solid C5-6 fusion. The central canal and foramina are widely patent.  IMPRESSION:    MRI lumbar spine without contrast demonstrating: - At L5-S1: Disc bulging, facet and ligamentum flavum hypertrophy with moderate bilateral foraminal stenosis and lateral recess stenosis - At L2-3: Disc bulging and facet hypertrophy with mild bilateral foraminal stenosis.  PATIENT SURVEYS:  EVAL: FOTO 65 (risk adjusted 53) Goal 73 in 10 visits  COGNITION: Overall cognitive status: Within functional limits for tasks assessed     SENSATION: No peripheral tingling or numbness noted  POSTURE: rounded shoulders, forward head, and decreased lumbar lordosis  LUMBAR & CERVICAL ROM:   AROM eval  Cervical extension 50  Lumbar Extension 10  Right trunk lateral flexion 25  Left trunk lateral flexion 20  Right cervical rotation 45  Left cervical rotation 35   (Blank rows = not tested)   Spine Strength:    MMT Right eval Left eval  Hip flexion    Hip extension    Hip abduction    Hip adduction    Hip internal rotation    Hip external rotation    Knee flexion    Cervical extension 10.3 pounds (Goal 27)   Prone "superwoman" strength test Unable to lift knees or elbows off table (30-60 seconds Goal)   Ankle plantarflexion    Ankle inversion    Ankle eversion     (Blank rows = not tested)   TODAY'S TREATMENT:  07/30/22 Therex UBE L3.0 backward x 3 min Cervical retraction x 5 reps; 5-10 sec hold  Manual STM with compression to thoracic paraspinals and levator scapulae; skilled palpation and monitoring of soft tissue during DN.   Trigger Point Dry-Needling  Treatment instructions: Expect mild to moderate muscle soreness. S/S  of pneumothorax if dry needled over a lung field, and to seek immediate medical attention should they occur. Patient verbalized understanding of these instructions and education.  Patient Consent Given: Yes Education handout provided: No; verbally reviewed and will provide next visit Muscles treated: bil T7 multifidi; Lt levator scapula Electrical stimulation performed: No Parameters: N/A Treatment response/outcome: twitch responses      07/22/22 Therex UBE L3.0 backward x 3 min Rows L4 band x 10 reps Standing ER with scap retraction L4 band x 10 reps Standing horizontal abduction L4 band x 10 reps Standing lat pull with L4 band x 10 reps Rows 15# 2x10 Seated thoracic extension 10 x 10 sec hold with towel roll  Manual STM with palpation of soft tissue to assess for active trigger points - she has trigger points but no reproduction of pain.  Will consider DN in the future if indicated.  07/16/22 Therex UBE L3.0 backward x 3 min Rows L3 band 2x10; 5 sec hold Supine extension isometrics 10 x 5 sec hold Supine horizontal abduction with L3 band x 10 reps Supine overhead pull with L3 band x 10 reps Supine ER with L3 band x 10 reps Supine scapular protraction 3# x 10 reps bil Bent over row with 3# bil x 10 reps Bent over flys x10 with 3# bil Bent over shoulder extension 3# bil x 10 reps Shoulder taps at counter x 10 reps alternating   [facility] Adult PT Treatment:  DATE: 07/10/2022 Shoulder blade pinches 10X 5 seconds Cervical Isometrics Extension 10X 5 seconds Pull to chest/Rows with Blue theraband 10X 3 seconds  Functional Activities: Reviewed imaging, spine anatomy, basic body mechanics with reading, computers, basic postural awareness, exam findings and starter HEP   PATIENT EDUCATION:  Education details: See above Person educated: Patient Education  method: Explanation, Demonstration, Tactile cues, Verbal cues, and Handouts Education comprehension: verbalized understanding, returned demonstration, verbal cues required, tactile cues required, and needs further education   HOME EXERCISE PROGRAM: Access Code: [email or web address] URL: [email or web address] Date: 07/22/2022 Prepared by: Faustino Congress  Exercises - Standing Scapular Retraction  - 5 x daily - 7 x weekly - 1 sets - 5 reps - 5 second hold - Standing Row with Anchored Resistance Band with PLB  - 1 x daily - 1 x weekly - 1-2 sets - 15-20 reps - 3 seconds hold - Standing Isometric Cervical Extension with Manual Resistance  - 3-5 x daily - 7 x weekly - 1 sets - 5 reps - 5 hold - Seated Thoracic Lumbar Extension  - 2-3 x daily - 7 x weekly - 1 sets - 10 reps - 10 sec hold  ASSESSMENT:  CLINICAL IMPRESSION: Trial of DN today to see if this helps with symptoms.  May also trial cervical traction to determine if this is helpful as well.  Will continue to benefit from PT to maximize function.  OBJECTIVE IMPAIRMENTS: decreased activity tolerance, decreased endurance, decreased knowledge of condition, decreased strength, decreased safety awareness, impaired perceived functional ability, improper body mechanics, postural dysfunction, and pain.   ACTIVITY LIMITATIONS: carrying, lifting, and activities later in the day  PARTICIPATION LIMITATIONS: community activity  PERSONAL FACTORS: OA, hypothyroid, neuropathy, restless leg syndrome, previous cervical and lumbar radiculopathy, vericose veins R LE, previous C5-6 fusion, L shoulder scope 2016, see cardiac history in PMH are also affecting patient's functional outcome.   REHAB POTENTIAL: Good  CLINICAL DECISION MAKING: Stable/uncomplicated  EVALUATION COMPLEXITY: Low   GOALS: Goals reviewed with patient? Yes  SHORT TERM GOALS: Target date: 08/07/2022  Improve cervical extensor strength to a minimum of 20 pounds Baseline:  10 pounds Goal status: INITIAL  2.  Axel will be able to maintain the spine strength test position for 15 seconds Baseline: Unable to lift elbows or knees off table Goal status: INITIAL   LONG TERM GOALS: Target date: 08/21/2022  Improve FOTO to 73 Baseline: 65 (risk adjusted 53) Goal status: INITIAL  2.  Misaki will report thoracic spine pain consistently 0-2/10 on the Numeric Pain Rating Scale Baseline: Can be 6/10 Goal status: INITIAL  3.  Improve Bil cervical rotation AROM to 45 degrees Baseline: 35-45 degrees Goal status: INITIAL  4.  Improve lumbar extension AROM to 15 degrees Baseline: 10 degrees Goal status: INITIAL  5.  Improve cervical extension strength to 27 pounds and spine strength to 30-60 seconds as assessed by the "superwoman" spine strength test Baseline: See STG Goal status: INITIAL  6.  Emmaline will be independent with her long-term HEP at DC Baseline: Started 07/10/2022 Goal status: INITIAL   PLAN: PT FREQUENCY: 1-2x/week  PT DURATION: 6 weeks  PLANNED INTERVENTIONS: Therapeutic exercises, Therapeutic activity, Neuromuscular re-education, Patient/Family education, Self Care, and Dry Needling.  PLAN FOR NEXT SESSION:  check STGs; see how cervical retraction is going (is it helping?) Review/update HEP PRN, progress scapular theraband strengthening,  scapular strengthening (supine, quadruped, standing as able - pt doesn't like prone), UBE pull-only, practical posture and body mechanics  Laureen Abrahams, PT, DPT 07/30/22 11:24 AM

## 2022-08-05 ENCOUNTER — Ambulatory Visit: Payer: PPO | Attending: Internal Medicine | Admitting: Internal Medicine

## 2022-08-05 ENCOUNTER — Encounter: Payer: Self-pay | Admitting: Internal Medicine

## 2022-08-05 VITALS — BP 104/62 | HR 76 | Ht 67.0 in | Wt 130.4 lb

## 2022-08-05 DIAGNOSIS — I493 Ventricular premature depolarization: Secondary | ICD-10-CM | POA: Diagnosis not present

## 2022-08-05 DIAGNOSIS — R002 Palpitations: Secondary | ICD-10-CM | POA: Diagnosis not present

## 2022-08-05 DIAGNOSIS — I4891 Unspecified atrial fibrillation: Secondary | ICD-10-CM | POA: Diagnosis not present

## 2022-08-05 NOTE — Patient Instructions (Addendum)
Medication Instructions:  You will adjust your medications today according to Dr. Tanna Furry instructions.     *If you need a refill on your cardiac medications before your next appointment, please call your pharmacy*  Lab Work: None ordered.  If you have labs (blood work) drawn today and your tests are completely normal, you will receive your results only by: [place] (if you have MyChart) OR A paper copy in the mail If you have any lab test that is abnormal or we need to change your treatment, we will call you to review the results.  Testing/Procedures: None ordered.  Follow-Up: At [facility], you and your health needs are our priority.  As part of our continuing mission to provide you with exceptional heart care, we have created designated Provider Care Teams.  These Care Teams include your primary Cardiologist (physician) and Advanced Practice Providers (APPs -  Physician Assistants and Nurse Practitioners) who all work together to provide you with the care you need, when you need it.  We recommend signing up for the patient portal called "MyChart".  Sign up information is provided on this After Visit Summary.  MyChart is used to connect with patients for Virtual Visits (Telemedicine).  Patients are able to view lab/test results, encounter notes, upcoming appointments, etc.  Non-urgent messages can be sent to your provider as well.   To learn more about what you can do with MyChart, go to [email or web address].    Your next appointment:   You will F/U with Dr. Lovena Le in 6 Months.    The format for your next appointment:   In Person  Provider:   Cristopher Peru, MD{or one of the following Advanced Practice Providers on your designated Care Team:   Tommye Standard, [place] Legrand Como "Jonni Sanger" Chalmers Cater, [place]    Important Information About Sugar

## 2022-08-05 NOTE — Progress Notes (Signed)
HPI Ms. Barbian returns for followup. She is a pleasant 76 yo woman with MV repair s/p MV surgery. She was initially on amiodarone but then switched back to flecainide. She has continued to be symptomatic and  she had occaisional PAC's and PVC's and very brief NS SVT. She thinks that on higher dose flecainide her palpitations were worse.  When I last saw her we uptitrated her metoprolol and she is a little better. No syncope.     Current Outpatient Medications  Medication Sig Dispense Refill   acetaminophen (TYLENOL) 500 MG tablet Take 500-1,000 mg by mouth See admin instructions. Take 500 mg at night, may take a second 500 -1000 mg dose as needed for pain     aspirin EC 81 MG tablet Take 1 tablet (81 mg total) by mouth daily. Swallow whole. 90 tablet 3   betamethasone dipropionate 0.05 % cream Apply 1 application. topically 4 (four) times daily.     Biotin 5000 MCG CAPS Take 5,000 mcg by mouth daily.     Cholecalciferol (VITAMIN D) 2000 units tablet Take 2,000 Units by mouth daily.     Cyanocobalamin (B-12) 5000 MCG CAPS Take 5,000 mcg by mouth once a week. Mondays     flecainide (TAMBOCOR) 50 MG tablet Take 1 tablet (50 mg total) by mouth 2 (two) times daily. 180 tablet 3   gabapentin (NEURONTIN) 600 MG tablet Take 600 mg by mouth 2 (two) times daily.     levothyroxine (SYNTHROID) 50 MCG tablet Take 1 tablet (50 mcg total) by mouth daily before breakfast.     melatonin 5 MG TABS Take 5 mg by mouth at bedtime.     metoprolol tartrate (LOPRESSOR) 25 MG tablet Take 1 tablet (25 mg total) by mouth 2 (two) times daily. 180 tablet 3   metroNIDAZOLE (METROCREAM) 0.75 % cream Apply 1 Application topically 2 (two) times daily.     primidone (MYSOLINE) 50 MG tablet Take 50 mg by mouth at bedtime.     SYSTANE ULTRA 0.4-0.3 % SOLN Place 1 drop into the left eye every 4 (four) hours as needed (dry/irritated eyes. (4-6 times daily)).      No current facility-administered medications for this visit.      Past Medical History:  Diagnosis Date   Anxiety disorder    Arthritis    Cataract    Essential tremor    /familial   Heart murmur 2021   corrected with valve surgery   Hypothyroidism    Interstitial cystitis    Malignant neoplasm of lower-outer quadrant of right breast of female, estrogen receptor positive ([place]) 02/11/2012   Neuropathy    Nonrheumatic mitral valve regurgitation    PAC (premature atrial contraction)    Paresthesias 07/29/2014   PREMATURE ATRIAL CONTRACTIONS 05/08/2009   Qualifier: Diagnosis of  By: [name], RN, [name]     Radiculopathy, cervical region    RLS (restless legs syndrome)    S/P minimally-invasive mitral valve repair 02/10/2020   Complex valvuloplasty including artificial Gore-tex neochord placement x8 with edge-to-edge suture plication of anterior commissure and 32 mm [name] 4D ring annuloplasty via right mini thoracotomy approach   Thyroid disease    Varicose veins of right lower extremity with complications 34/19/3790    ROS:   All systems reviewed and negative except as noted in the HPI.   Past Surgical History:  Procedure Laterality Date   bilateral cataract repair  july/aug 2021   Dr Tommy Rainwater   BREAST BIOPSY  2009   BREAST LUMPECTOMY  2010   CARDIAC CATHETERIZATION  2021   DILATION AND CURETTAGE OF UTERUS     age 80   EYE SURGERY Left    Pterygium   HEMANGIOMA EXCISION Left 1990   arm   MELANOMA EXCISION Right 1980   thigh   MITRAL VALVE REPAIR Right 02/10/2020   Procedure: MINIMALLY INVASIVE MITRAL VALVE REPAIR (MVR) using Memo 4D 32 MM Mitral Valve Ring.;  Surgeon: Rexene Alberts, MD;  Location: [place];  Service: Open Heart Surgery;  Laterality: Right;   NECK SURGERY  10/2009   C5-6 fusion   RIGHT/LEFT HEART CATH AND CORONARY ANGIOGRAPHY N/A 01/04/2020   Procedure: RIGHT/LEFT HEART CATH AND CORONARY ANGIOGRAPHY;  Surgeon: Burnell Blanks, MD;  Location: [place] [facility];  Service: Cardiovascular;  Laterality:  N/A;   ROBOTIC ASSISTED BILATERAL SALPINGO OOPHERECTOMY Bilateral 10/01/2021   Procedure: XI ROBOTIC ASSISTED BILATERAL SALPINGO OOPHORECTOMY;  Surgeon: Lafonda Mosses, MD;  [facility];  Service: Gynecology;  Laterality: Bilateral;   SHOULDER ARTHROSCOPY Left 2016   spider vein treatment     TEE WITHOUT CARDIOVERSION N/A 06/30/2018   Procedure: TRANSESOPHAGEAL ECHOCARDIOGRAM (TEE);  Surgeon: Sueanne Margarita, MD;  Location: [facility] ENDOSCOPY;  Service: Cardiovascular;  Laterality: N/A;   TEE WITHOUT CARDIOVERSION N/A 12/12/2019   Procedure: TRANSESOPHAGEAL ECHOCARDIOGRAM (TEE);  Surgeon: Dorothy Spark, MD;  Location: [facility] ENDOSCOPY;  Service: Cardiovascular;  Laterality: N/A;   TEE WITHOUT CARDIOVERSION N/A 02/10/2020   Procedure: TRANSESOPHAGEAL ECHOCARDIOGRAM (TEE);  Surgeon: Rexene Alberts, MD;  Location: [place];  Service: Open Heart Surgery;  Laterality: N/A;   TONSILLECTOMY     as a child   uterine fibroids removed  2012     Family History  Problem Relation Age of Onset   Coronary artery disease Mother        s/p MI (45s)   Hypertension Mother    Osteoporosis Mother        wrist fracture   Alzheimer's disease Mother    Osteoarthritis Father    Neuropathy Father    Tremor Father    Other Father        lymphedema   Fibromyalgia Sister    Heart disease Maternal Grandmother    Colon polyps Neg Hx    Esophageal cancer Neg Hx    Pancreatic cancer Neg Hx    Stomach cancer Neg Hx    Rectal cancer Neg Hx    Ovarian cancer Neg Hx    Breast cancer Neg Hx    Colon cancer Neg Hx    Prostate cancer Neg Hx    Endometrial cancer Neg Hx      Social History   Socioeconomic History   Marital status: Married    Spouse name: [name]   Number of children: 2   Years of education: Bachelor   Highest education level: Not on file  Occupational History   Not on file  Tobacco Use   Smoking status: Never   Smokeless tobacco: Never  Vaping Use   Vaping Use: Never used   Substance and Sexual Activity   Alcohol use: Yes    Comment: Socially   Drug use: No   Sexual activity: Yes    Birth control/protection: Post-menopausal  Other Topics Concern   Not on file  Social History Narrative   Patient is married to [place], has 2 children   Patient is right handed   Education level is Bachelor   Caffeine consumption is  none other than in chocolate occasionally       Social Determinants of Health   Financial Resource Strain: Not on file  Food Insecurity: Not on file  Transportation Needs: Not on file  Physical Activity: Not on file  Stress: Not on file  Social Connections: Not on file  Intimate Partner Violence: Not on file     BP 104/62   Pulse 76   Ht '5\' 7"'$  (1.702 m)   Wt 130 lb 6.4 oz (59.1 kg)   SpO2 99%   BMI 20.42 kg/m   Physical Exam:  Well appearing NAD HEENT: Unremarkable Neck:  No JVD, no thyromegally Lymphatics:  No adenopathy Back:  No CVA tenderness Lungs:  Clear with no wheezes HEART:  Regular rate rhythm, no murmurs, no rubs, no clicks Abd:  soft, positive bowel sounds, no organomegally, no rebound, no guarding Ext:  2 plus pulses, no edema, no cyanosis, no clubbing Skin:  No rashes no nodules Neuro:  CN II through XII intact, motor grossly intact  EKG - nsr with left axis  Assess/Plan: Palpitations - her symptoms are improved. We discussed the treatment options including stopping the low dose of flecainide and uptitration further of her metoprolol. She will continue as she is. I encouraged her to take an additional dose of beta blocker as needed.  Carleene Overlie Ysabel Cowgill,MD

## 2022-08-06 ENCOUNTER — Ambulatory Visit: Payer: PPO | Admitting: Physical Therapy

## 2022-08-06 ENCOUNTER — Encounter: Payer: Self-pay | Admitting: Physical Therapy

## 2022-08-06 DIAGNOSIS — M546 Pain in thoracic spine: Secondary | ICD-10-CM

## 2022-08-06 DIAGNOSIS — M6281 Muscle weakness (generalized): Secondary | ICD-10-CM | POA: Diagnosis not present

## 2022-08-06 DIAGNOSIS — R293 Abnormal posture: Secondary | ICD-10-CM | POA: Diagnosis not present

## 2022-08-06 NOTE — Therapy (Signed)
OUTPATIENT PHYSICAL THERAPY TREATMENT NOTE   Patient Name: Jaira Canady MRN: [id number] DOB:1946/01/11, 76 y.o., female Today's Date: 08/06/2022  END OF SESSION:   PT End of Session - 08/06/22 1144     Visit Number 5    Number of Visits 10    Date for PT Re-Evaluation 08/22/22    PT Start Time 7616    PT Stop Time 1225    PT Time Calculation (min) 40 min    Activity Tolerance Patient tolerated treatment well;No increased pain    Behavior During Therapy WFL for tasks assessed/performed                Past Medical History:  Diagnosis Date   Anxiety disorder    Arthritis    Cataract    Essential tremor    /familial   Heart murmur 2021   corrected with valve surgery   Hypothyroidism    Interstitial cystitis    Malignant neoplasm of lower-outer quadrant of right breast of female, estrogen receptor positive ([place]) 02/11/2012   Neuropathy    Nonrheumatic mitral valve regurgitation    PAC (premature atrial contraction)    Paresthesias 07/29/2014   PREMATURE ATRIAL CONTRACTIONS 05/08/2009   Qualifier: Diagnosis of  By: [name], RN, [name]     Radiculopathy, cervical region    RLS (restless legs syndrome)    S/P minimally-invasive mitral valve repair 02/10/2020   Complex valvuloplasty including artificial Gore-tex neochord placement x8 with edge-to-edge suture plication of anterior commissure and 32 mm [name] 4D ring annuloplasty via right mini thoracotomy approach   Thyroid disease    Varicose veins of right lower extremity with complications 07/37/1062   Past Surgical History:  Procedure Laterality Date   bilateral cataract repair  july/aug 2021   Dr Tommy Rainwater   BREAST BIOPSY  2009   BREAST LUMPECTOMY  2010   [place]  2021   [place]     age 39   EYE SURGERY Left    Pterygium   HEMANGIOMA EXCISION Left 1990   arm   MELANOMA EXCISION Right 1980   thigh   MITRAL VALVE REPAIR Right 02/10/2020   Procedure: MINIMALLY  INVASIVE MITRAL VALVE REPAIR (MVR) using Memo 4D 32 MM Mitral Valve Ring.;  Surgeon: Rexene Alberts, MD;  Location: [facility];  Service: Open Heart Surgery;  Laterality: Right;   NECK SURGERY  10/2009   C5-6 fusion   RIGHT/LEFT HEART CATH AND CORONARY ANGIOGRAPHY N/A 01/04/2020   Procedure: RIGHT/LEFT HEART CATH AND CORONARY ANGIOGRAPHY;  Surgeon: Burnell Blanks, MD;  Location: [facility];  Service: Cardiovascular;  Laterality: N/A;   ROBOTIC ASSISTED BILATERAL SALPINGO OOPHERECTOMY Bilateral 10/01/2021   Procedure: XI ROBOTIC ASSISTED BILATERAL SALPINGO OOPHORECTOMY;  Surgeon: Lafonda Mosses, MD;  Location: [facility];  Service: Gynecology;  Laterality: Bilateral;   SHOULDER ARTHROSCOPY Left 2016   spider vein treatment     TEE WITHOUT CARDIOVERSION N/A 06/30/2018   Procedure: TRANSESOPHAGEAL ECHOCARDIOGRAM (TEE);  Surgeon: Sueanne Margarita, MD;  Location: [facility];  Service: Cardiovascular;  Laterality: N/A;   TEE WITHOUT CARDIOVERSION N/A 12/12/2019   Procedure: TRANSESOPHAGEAL ECHOCARDIOGRAM (TEE);  Surgeon: Dorothy Spark, MD;  Location: [facility];  Service: Cardiovascular;  Laterality: N/A;   TEE WITHOUT CARDIOVERSION N/A 02/10/2020   Procedure: TRANSESOPHAGEAL ECHOCARDIOGRAM (TEE);  Surgeon: Rexene Alberts, MD;  Location: [place];  Service: Open Heart Surgery;  Laterality: N/A;   TONSILLECTOMY     as a  child   uterine fibroids removed  2012   Patient Active Problem List   Diagnosis Date Noted   Palpitations 08/05/2022   PVC (premature ventricular contraction) 06/05/2022   Adnexal mass    Cyst of right ovary 08/21/2021   Weight loss 08/21/2021   Nausea without vomiting 08/21/2021   Encounter for removal of sutures 02/23/2020   Encounter for therapeutic drug monitoring 02/20/2020   Atrial fibrillation ([place]) 02/20/2020   S/P minimally-invasive mitral valve repair 02/10/2020   Nonrheumatic mitral valve regurgitation    Varicose veins of right lower extremity  with complications 61/44/3154   Paresthesias 07/29/2014   Tremor 12/08/2013   Personal history of breast cancer 09/06/2013   Dense breast 09/06/2013   Malignant neoplasm of lower-outer quadrant of right breast of female, estrogen receptor positive ([place]) 02/11/2012   Severe mitral valve regurgitation 05/08/2009   PREMATURE ATRIAL CONTRACTIONS 05/08/2009     THERAPY DIAG:  Abnormal posture  Muscle weakness (generalized)  Pain in thoracic spine   PCP: Ginger Organ, MD  REFERRING PROVIDER: Ginger Organ, MD  REFERRING DIAG: M54.6 (ICD-10-CM) - Pain in thoracic spine   Rationale for Evaluation and Treatment Rehabilitation  EVAL THERAPY DIAG:  Abnormal posture  Muscle weakness (generalized)  Pain in thoracic spine  ONSET DATE: Few months  SUBJECTIVE:                                                                                                                                                                                           SUBJECTIVE STATEMENT: Felt like DN was a little helpful; noticed less episodes  PERTINENT HISTORY:  OA, hypothyroid, neuropathy, restless leg syndrome, previous cervical and lumbar radiculopathy, vericose veins R LE, previous C5-6 fusion, L shoulder scope 2016, see cardiac history in PMH  PAIN:  Are you having pain? Yes: NPRS scale: 0/10 Pain location: Across shoulders, mid-scapular Pain description: Starts with a tingle, then sore, achy builds up to a burn Aggravating factors: Activities later in the day, flexed postures Relieving factors: Heat, tylenol   PRECAUTIONS: Back  WEIGHT BEARING RESTRICTIONS: No  FALLS:  Has patient fallen in last 6 months? No  LIVING ENVIRONMENT: Lives with: lives with their spouse Lives in: House/apartment Stairs: Does not avoid Has following equipment at home: None  OCCUPATION: Retired  PLOF: Independent  PATIENT GOALS: [place], book club, reading, social  activities   OBJECTIVE:   DIAGNOSTIC FINDINGS:  IMPRESSION: The central canal is widely patent at all levels. Spondylosis results in mild right foraminal narrowing at C4-5 and left foraminal narrowing at C6-7.   Status post solid C5-6 fusion.  The central canal and foramina are widely patent.  IMPRESSION:    MRI lumbar spine without contrast demonstrating: - At L5-S1: Disc bulging, facet and ligamentum flavum hypertrophy with moderate bilateral foraminal stenosis and lateral recess stenosis - At L2-3: Disc bulging and facet hypertrophy with mild bilateral foraminal stenosis.  PATIENT SURVEYS:  EVAL: FOTO 65 (risk adjusted 53) Goal 73 in 10 visits  COGNITION: Overall cognitive status: Within functional limits for tasks assessed     SENSATION: No peripheral tingling or numbness noted  POSTURE: rounded shoulders, forward head, and decreased lumbar lordosis  LUMBAR & CERVICAL ROM:   AROM eval  Cervical extension 50  Lumbar Extension 10  Right trunk lateral flexion 25  Left trunk lateral flexion 20  Right cervical rotation 45  Left cervical rotation 35   (Blank rows = not tested)   Spine Strength:    MMT Right eval Left eval  Hip flexion    Hip extension    Hip abduction    Hip adduction    Hip internal rotation    Hip external rotation    Knee flexion    Cervical extension 10.3 pounds (Goal 27)   Prone "superwoman" strength test Unable to lift knees or elbows off table (30-60 seconds Goal)   Ankle plantarflexion    Ankle inversion    Ankle eversion     (Blank rows = not tested)   TODAY'S TREATMENT:  08/06/22 Therex UBE L3.0 backward x 3 min Discussed various positions to work on thoracic extension including prone with head off mat, and seated.  Trial of snow angels against wall with 1/2 foam roll along spine x 10 reps.    Manual STM with compression to thoracic paraspinals, rhomboids, and levator scapulae; skilled palpation and monitoring of soft tissue  during DN.   Trigger Point Dry-Needling  Treatment instructions: Expect mild to moderate muscle soreness. S/S of pneumothorax if dry needled over a lung field, and to seek immediate medical attention should they occur. Patient verbalized understanding of these instructions and education.  Patient Consent Given: Yes Education handout provided: No; verbally reviewed and will provide next visit Muscles treated: bil T7 multifidi; Lt levator scapula, Lt rhomboids Electrical stimulation performed: No Parameters: N/A Treatment response/outcome: twitch responses  07/30/22 Therex UBE L3.0 backward x 3 min Cervical retraction x 5 reps; 5-10 sec hold  Manual STM with compression to thoracic paraspinals and levator scapulae; skilled palpation and monitoring of soft tissue during DN.   Trigger Point Dry-Needling  Treatment instructions: Expect mild to moderate muscle soreness. S/S of pneumothorax if dry needled over a lung field, and to seek immediate medical attention should they occur. Patient verbalized understanding of these instructions and education.  Patient Consent Given: Yes Education handout provided: No; verbally reviewed and will provide next visit Muscles treated: bil T7 multifidi; Lt levator scapula Electrical stimulation performed: No Parameters: N/A Treatment response/outcome: twitch responses      07/22/22 Therex UBE L3.0 backward x 3 min Rows L4 band x 10 reps Standing ER with scap retraction L4 band x 10 reps Standing horizontal abduction L4 band x 10 reps Standing lat pull with L4 band x 10 reps Rows 15# 2x10 Seated thoracic extension 10 x 10 sec hold with towel roll  Manual STM with palpation of soft tissue to assess for active trigger points - she has trigger points but no reproduction of pain.  Will consider DN in the future if indicated.  07/16/22 Therex UBE L3.0 backward x 3 min  Rows L3 band 2x10; 5 sec hold Supine extension isometrics 10 x 5 sec  hold Supine horizontal abduction with L3 band x 10 reps Supine overhead pull with L3 band x 10 reps Supine ER with L3 band x 10 reps Supine scapular protraction 3# x 10 reps bil Bent over row with 3# bil x 10 reps Bent over flys x10 with 3# bil Bent over shoulder extension 3# bil x 10 reps Shoulder taps at counter x 10 reps alternating   [facility] Adult PT Treatment:                                                                                                                            DATE: 07/10/2022 Shoulder blade pinches 10X 5 seconds Cervical Isometrics Extension 10X 5 seconds Pull to chest/Rows with Blue theraband 10X 3 seconds  Functional Activities: Reviewed imaging, spine anatomy, basic body mechanics with reading, computers, basic postural awareness, exam findings and starter HEP   PATIENT EDUCATION:  Education details: See above Person educated: Patient Education method: Explanation, Demonstration, Tactile cues, Verbal cues, and Handouts Education comprehension: verbalized understanding, returned demonstration, verbal cues required, tactile cues required, and needs further education   HOME EXERCISE PROGRAM: Access Code: [email or web address] URL: [email or web address] Date: 08/06/2022 Prepared by: Faustino Congress  Exercises - Standing Scapular Retraction  - 5 x daily - 7 x weekly - 1 sets - 5 reps - 5 second hold - Standing Row with Anchored Resistance Band with PLB  - 1 x daily - 1 x weekly - 1-2 sets - 15-20 reps - 3 seconds hold - Standing Isometric Cervical Extension with Manual Resistance  - 3-5 x daily - 7 x weekly - 1 sets - 5 reps - 5 hold - Seated Thoracic Lumbar Extension  - 2-3 x daily - 7 x weekly - 1 sets - 10 reps - 10 sec hold - Supine Chin Tuck  - 3-5 x daily - 7 x weekly - 1 sets - 5-10 reps - 5-10 sec hold - Seated Cervical Retraction  - 3-5 x daily - 7 x weekly - 1 sets - 5-10 reps - 5-10 sec hold - Prone Upper Back Extension Off Table with Hands  Behind Head  - 1 x daily - 7 x weekly - 3 sets - 10 reps - Prone Triple Extension with W  - 1 x daily - 7 x weekly - 1-2 sets - 10 reps - Standing Shoulder W at Wall  - 1 x daily - 7 x weekly - 1-2 sets - 10 reps - Wall Angels  - 1 x daily - 7 x weekly - 1-2 sets - 10 reps  ASSESSMENT:  CLINICAL IMPRESSION: Positive response to DN last visit so repeated today as well as rhomboid trigger point.  Will continue to benefit from PT to maximize function.  OBJECTIVE IMPAIRMENTS: decreased activity tolerance, decreased endurance, decreased knowledge of condition, decreased strength, decreased safety awareness, impaired perceived functional ability, improper body mechanics,  postural dysfunction, and pain.   ACTIVITY LIMITATIONS: carrying, lifting, and activities later in the day  PARTICIPATION LIMITATIONS: community activity  PERSONAL FACTORS: OA, hypothyroid, neuropathy, restless leg syndrome, previous cervical and lumbar radiculopathy, vericose veins R LE, previous C5-6 fusion, L shoulder scope 2016, see cardiac history in PMH are also affecting patient's functional outcome.   REHAB POTENTIAL: Good  CLINICAL DECISION MAKING: Stable/uncomplicated  EVALUATION COMPLEXITY: Low   GOALS: Goals reviewed with patient? Yes  SHORT TERM GOALS: Target date: 08/07/2022  Improve cervical extensor strength to a minimum of 20 pounds Baseline: 10 pounds Goal status: INITIAL  2.  Raylen will be able to maintain the spine strength test position for 15 seconds Baseline: Unable to lift elbows or knees off table Goal status: INITIAL   LONG TERM GOALS: Target date: 08/21/2022  Improve FOTO to 73 Baseline: 65 (risk adjusted 53) Goal status: INITIAL  2.  Yesennia will report thoracic spine pain consistently 0-2/10 on the Numeric Pain Rating Scale Baseline: Can be 6/10 Goal status: INITIAL  3.  Improve Bil cervical rotation AROM to 45 degrees Baseline: 35-45 degrees Goal status: INITIAL  4.  Improve  lumbar extension AROM to 15 degrees Baseline: 10 degrees Goal status: INITIAL  5.  Improve cervical extension strength to 27 pounds and spine strength to 30-60 seconds as assessed by the "superwoman" spine strength test Baseline: See STG Goal status: INITIAL  6.  Aqsa will be independent with her long-term HEP at [place] Baseline: Started 07/10/2022 Goal status: INITIAL   PLAN: PT FREQUENCY: 1-2x/week  PT DURATION: 6 weeks  PLANNED INTERVENTIONS: Therapeutic exercises, Therapeutic activity, Neuromuscular re-education, Patient/Family education, Self Care, and Dry Needling.  PLAN FOR NEXT SESSION:  check STGs (deferred today); review/update HEP PRN, progress scapular theraband strengthening,  scapular strengthening (supine, quadruped, standing as able - pt doesn't like prone), UBE pull-only, practical posture and body mechanics    Laureen Abrahams, PT, DPT 08/06/22 12:39 PM

## 2022-08-12 NOTE — Therapy (Addendum)
OUTPATIENT PHYSICAL THERAPY TREATMENT/DISCHARGE NOTE   Patient Name: Kellie Humphrey MRN: [id number] DOB:[birth year], 76 y.o., female Today's Date: 09/12/2022  PHYSICAL THERAPY DISCHARGE SUMMARY  Visits from Start of Care: 6  Current functional level related to goals / functional outcomes: See note   Remaining deficits: See note   Education / Equipment: HEP   Patient agrees to discharge. Patient goals were partially met. Patient is being discharged due to not returning since the last visit.    END OF SESSION:         Past Medical History:  Diagnosis Date   Anxiety disorder    Arthritis    Cataract    Essential tremor    /familial   Heart murmur 2021   corrected with valve surgery   Hypothyroidism    Interstitial cystitis    Malignant neoplasm of lower-outer quadrant of right breast of female, estrogen receptor positive ([place]) 02/11/2012   Neuropathy    Nonrheumatic mitral valve regurgitation    PAC (premature atrial contraction)    Paresthesias 07/29/2014   PREMATURE ATRIAL CONTRACTIONS 05/08/2009   Qualifier: Diagnosis of  By: [name], RN, [name]     Radiculopathy, cervical region    RLS (restless legs syndrome)    S/P minimally-invasive mitral valve repair 02/10/2020   Complex valvuloplasty including artificial Gore-tex neochord placement x8 with edge-to-edge suture plication of anterior commissure and 32 mm [name] 4D ring annuloplasty via right mini thoracotomy approach   Thyroid disease    Varicose veins of right lower extremity with complications 35/45/6256   Past Surgical History:  Procedure Laterality Date   bilateral cataract repair  july/aug 2021   Dr Tommy Rainwater   BREAST BIOPSY  2009   BREAST LUMPECTOMY  2010   [place]  2021   DILATION AND CURETTAGE OF UTERUS     age 83   EYE SURGERY Left    Pterygium   HEMANGIOMA EXCISION Left 1990   arm   MELANOMA EXCISION Right 1980   thigh   MITRAL VALVE REPAIR Right 02/10/2020    Procedure: MINIMALLY INVASIVE MITRAL VALVE REPAIR (MVR) using Memo 4D 32 MM Mitral Valve Ring.;  Surgeon: Rexene Alberts, MD;  Location: [facility];  Service: Open Heart Surgery;  Laterality: Right;   NECK SURGERY  10/2009   C5-6 fusion   RIGHT/LEFT HEART CATH AND CORONARY ANGIOGRAPHY N/A 01/04/2020   Procedure: RIGHT/LEFT HEART CATH AND CORONARY ANGIOGRAPHY;  Surgeon: Burnell Blanks, MD;  Location: [facility];  Service: Cardiovascular;  Laterality: N/A;   ROBOTIC ASSISTED BILATERAL SALPINGO OOPHERECTOMY Bilateral 10/01/2021   Procedure: XI ROBOTIC ASSISTED BILATERAL SALPINGO OOPHORECTOMY;  Surgeon: Lafonda Mosses, MD;  Location: [facility];  Service: Gynecology;  Laterality: Bilateral;   SHOULDER ARTHROSCOPY Left 2016   spider vein treatment     TEE WITHOUT CARDIOVERSION N/A 06/30/2018   Procedure: TRANSESOPHAGEAL ECHOCARDIOGRAM (TEE);  Surgeon: Sueanne Margarita, MD;  [facility];  Service: Cardiovascular;  Laterality: N/A;   TEE WITHOUT CARDIOVERSION N/A 12/12/2019   Procedure: TRANSESOPHAGEAL ECHOCARDIOGRAM (TEE);  Surgeon: Dorothy Spark, MD;  Location: [facility]OSCOPY;  Service: Cardiovascular;  Laterality: N/A;   TEE WITHOUT CARDIOVERSION N/A 02/10/2020   Procedure: TRANSESOPHAGEAL ECHOCARDIOGRAM (TEE);  Surgeon: Rexene Alberts, MD;  Location: [facility];  Service: Open Heart Surgery;  Laterality: N/A;   TONSILLECTOMY     as a child   uterine fibroids removed  2012   Patient Active Problem List   Diagnosis Date Noted  Palpitations 08/05/2022   PVC (premature ventricular contraction) 06/05/2022   Adnexal mass    Cyst of right ovary 08/21/2021   Weight loss 08/21/2021   Nausea without vomiting 08/21/2021   Encounter for removal of sutures 02/23/2020   Encounter for therapeutic drug monitoring 02/20/2020   Atrial fibrillation ([place]) 02/20/2020   S/P minimally-invasive mitral valve repair 02/10/2020   Nonrheumatic mitral valve regurgitation    Varicose veins of  right lower extremity with complications 13/04/6577   Paresthesias 07/29/2014   Tremor 12/08/2013   Personal history of breast cancer 09/06/2013   Dense breast 09/06/2013   Malignant neoplasm of lower-outer quadrant of right breast of female, estrogen receptor positive ([place]) 02/11/2012   Severe mitral valve regurgitation 05/08/2009   PREMATURE ATRIAL CONTRACTIONS 05/08/2009     THERAPY DIAG:  Abnormal posture  Pain in thoracic spine  Muscle weakness (generalized)   PCP: Ginger Organ, MD  REFERRING PROVIDER: Ginger Organ, MD  REFERRING DIAG: M54.6 (ICD-10-CM) - Pain in thoracic spine   Rationale for Evaluation and Treatment Rehabilitation  EVAL THERAPY DIAG:  Abnormal posture  Muscle weakness (generalized)  Pain in thoracic spine  ONSET DATE: Few months  SUBJECTIVE:                                                                                                                                                                                           SUBJECTIVE STATEMENT: Doing okay, not sure if DN was overly beneficial; sore from doing yardwork over the past several days  PERTINENT HISTORY:  OA, hypothyroid, neuropathy, restless leg syndrome, previous cervical and lumbar radiculopathy, vericose veins R LE, previous C5-6 fusion, L shoulder scope 2016, see cardiac history in PMH  PAIN:  Are you having pain? Yes: NPRS scale: 0/10 Pain location: Across shoulders, mid-scapular Pain description: Starts with a tingle, then sore, achy builds up to a burn Aggravating factors: Activities later in the day, flexed postures Relieving factors: Heat, tylenol   PRECAUTIONS: Back  WEIGHT BEARING RESTRICTIONS: No  FALLS:  Has patient fallen in last 6 months? No  LIVING ENVIRONMENT: Lives with: lives with their spouse Lives in: House/apartment Stairs: Does not avoid Has following equipment at home: None  OCCUPATION: Retired  PLOF: Independent  PATIENT GOALS:  [place], book club, reading, social activities   OBJECTIVE:   DIAGNOSTIC FINDINGS:  IMPRESSION: The central canal is widely patent at all levels. Spondylosis results in mild right foraminal narrowing at C4-5 and left foraminal narrowing at C6-7.   Status post solid C5-6 fusion. The central canal and foramina are widely patent.  IMPRESSION:  MRI lumbar spine without contrast demonstrating: - At L5-S1: Disc bulging, facet and ligamentum flavum hypertrophy with moderate bilateral foraminal stenosis and lateral recess stenosis - At L2-3: Disc bulging and facet hypertrophy with mild bilateral foraminal stenosis.  PATIENT SURVEYS:  EVAL: FOTO 65 (risk adjusted 53) Goal 73 in 10 visits  COGNITION: Overall cognitive status: Within functional limits for tasks assessed     SENSATION: No peripheral tingling or numbness noted  POSTURE: rounded shoulders, forward head, and decreased lumbar lordosis  LUMBAR & CERVICAL ROM:   AROM eval  Cervical extension 50  Lumbar Extension 10  Right trunk lateral flexion 25  Left trunk lateral flexion 20  Right cervical rotation 45  Left cervical rotation 35   (Blank rows = not tested)   Spine Strength:    MMT Right eval Left eval  Hip flexion    Hip extension    Hip abduction    Hip adduction    Hip internal rotation    Hip external rotation    Knee flexion    Cervical extension 10.3 pounds (Goal 27)   Prone "superwoman" strength test Unable to lift knees or elbows off table (30-60 seconds Goal)   Ankle plantarflexion    Ankle inversion    Ankle eversion     (Blank rows = not tested)   TODAY'S TREATMENT:  08/13/22 Therex UBE L3.0 backward x 3 min Mid level doorway stretch 3x30 sec Sidelying book openers 10 x 10 sec hold Seated horizontal abduction x 10 reps; L4 band Seated ER/scapular retraction x 10 reps; L4 band Rows 10# on cables x 15 reps Lat pull downs 10# on cables x 15 reps  08/06/22 Therex UBE L3.0  backward x 3 min Discussed various positions to work on thoracic extension including prone with head off mat, and seated.  Trial of snow angels against wall with 1/2 foam roll along spine x 10 reps.    Manual STM with compression to thoracic paraspinals, rhomboids, and levator scapulae; skilled palpation and monitoring of soft tissue during DN.   Trigger Point Dry-Needling  Treatment instructions: Expect mild to moderate muscle soreness. S/S of pneumothorax if dry needled over a lung field, and to seek immediate medical attention should they occur. Patient verbalized understanding of these instructions and education.  Patient Consent Given: Yes Education handout provided: No; verbally reviewed and will provide next visit Muscles treated: bil T7 multifidi; Lt levator scapula, Lt rhomboids Electrical stimulation performed: No Parameters: N/A Treatment response/outcome: twitch responses  07/30/22 Therex UBE L3.0 backward x 3 min Cervical retraction x 5 reps; 5-10 sec hold  Manual STM with compression to thoracic paraspinals and levator scapulae; skilled palpation and monitoring of soft tissue during DN.   Trigger Point Dry-Needling  Treatment instructions: Expect mild to moderate muscle soreness. S/S of pneumothorax if dry needled over a lung field, and to seek immediate medical attention should they occur. Patient verbalized understanding of these instructions and education.  Patient Consent Given: Yes Education handout provided: No; verbally reviewed and will provide next visit Muscles treated: bil T7 multifidi; Lt levator scapula Electrical stimulation performed: No Parameters: N/A Treatment response/outcome: twitch responses      07/22/22 Therex UBE L3.0 backward x 3 min Rows L4 band x 10 reps Standing ER with scap retraction L4 band x 10 reps Standing horizontal abduction L4 band x 10 reps Standing lat pull with L4 band x 10 reps Rows 15# 2x10 Seated thoracic  extension 10 x 10 sec hold with towel  roll  Manual STM with palpation of soft tissue to assess for active trigger points - she has trigger points but no reproduction of pain.  Will consider DN in the future if indicated.  07/16/22 Therex UBE L3.0 backward x 3 min Rows L3 band 2x10; 5 sec hold Supine extension isometrics 10 x 5 sec hold Supine horizontal abduction with L3 band x 10 reps Supine overhead pull with L3 band x 10 reps Supine ER with L3 band x 10 reps Supine scapular protraction 3# x 10 reps bil Bent over row with 3# bil x 10 reps Bent over flys x10 with 3# bil Bent over shoulder extension 3# bil x 10 reps Shoulder taps at counter x 10 reps alternating   [facility] Adult PT Treatment:                                                                                                                            DATE: 07/10/2022 Shoulder blade pinches 10X 5 seconds Cervical Isometrics Extension 10X 5 seconds Pull to chest/Rows with Blue theraband 10X 3 seconds  Functional Activities: Reviewed imaging, spine anatomy, basic body mechanics with reading, computers, basic postural awareness, exam findings and starter HEP   PATIENT EDUCATION:  Education details: See above Person educated: Patient Education method: Explanation, Demonstration, Tactile cues, Verbal cues, and Handouts Education comprehension: verbalized understanding, returned demonstration, verbal cues required, tactile cues required, and needs further education   HOME EXERCISE PROGRAM: Access Code: [email or web address] URL: [email or web address] Date: 08/06/2022 Prepared by: Faustino Congress  Exercises - Standing Scapular Retraction  - 5 x daily - 7 x weekly - 1 sets - 5 reps - 5 second hold - Standing Row with Anchored Resistance Band with PLB  - 1 x daily - 1 x weekly - 1-2 sets - 15-20 reps - 3 seconds hold - Standing Isometric Cervical Extension with Manual Resistance  - 3-5 x daily - 7 x weekly - 1 sets - 5  reps - 5 hold - Seated Thoracic Lumbar Extension  - 2-3 x daily - 7 x weekly - 1 sets - 10 reps - 10 sec hold - Supine Chin Tuck  - 3-5 x daily - 7 x weekly - 1 sets - 5-10 reps - 5-10 sec hold - Seated Cervical Retraction  - 3-5 x daily - 7 x weekly - 1 sets - 5-10 reps - 5-10 sec hold - Prone Upper Back Extension Off Table with Hands Behind Head  - 1 x daily - 7 x weekly - 3 sets - 10 reps - Prone Triple Extension with W  - 1 x daily - 7 x weekly - 1-2 sets - 10 reps - Standing Shoulder W at Wall  - 1 x daily - 7 x weekly - 1-2 sets - 10 reps - Wall Angels  - 1 x daily - 7 x weekly - 1-2 sets - 10 reps  ASSESSMENT:  CLINICAL IMPRESSION: Pt would like to work on [employer]  and will call if additional PT appts are needed.  Will hold PT at this time.  All unmet goals ongoing.  OBJECTIVE IMPAIRMENTS: decreased activity tolerance, decreased endurance, decreased knowledge of condition, decreased strength, decreased safety awareness, impaired perceived functional ability, improper body mechanics, postural dysfunction, and pain.   ACTIVITY LIMITATIONS: carrying, lifting, and activities later in the day  PARTICIPATION LIMITATIONS: community activity  PERSONAL FACTORS: OA, hypothyroid, neuropathy, restless leg syndrome, previous cervical and lumbar radiculopathy, vericose veins R LE, previous C5-6 fusion, L shoulder scope 2016, see cardiac history in PMH are also affecting patient's functional outcome.   REHAB POTENTIAL: Good  CLINICAL DECISION MAKING: Stable/uncomplicated  EVALUATION COMPLEXITY: Low   GOALS: Goals reviewed with patient? Yes  SHORT TERM GOALS: Target date: 08/07/2022  Improve cervical extensor strength to a minimum of 20 pounds Baseline: 10 pounds Goal status: Ongoing 08/13/22  2.  Sania will be able to maintain the spine strength test position for 15 seconds Baseline: Unable to lift elbows or knees off table Goal status: Ongoing 08/13/22   LONG TERM GOALS: Target date:  08/21/2022  Improve FOTO to 73 Baseline: 65 (risk adjusted 53) Goal status: Ongoing 08/13/22  2.  Amor will report thoracic spine pain consistently 0-2/10 on the Numeric Pain Rating Scale Baseline: Can be 6/10 Goal status: Ongoing 08/13/22  3.  Improve Bil cervical rotation AROM to 45 degrees Baseline: 35-45 degrees Goal status: Ongoing 08/13/22  4.  Improve lumbar extension AROM to 15 degrees Baseline: 10 degrees Goal status:Ongoing 08/13/22  5.  Improve cervical extension strength to 27 pounds and spine strength to 30-60 seconds as assessed by the "superwoman" spine strength test Baseline: See STG Goal status: Ongoing 08/13/22  6.  Lasasha will be independent with her long-term HEP at [place] Baseline: Started 07/10/2022 Goal status: Met 08/13/22   PLAN: PT FREQUENCY: 1-2x/week  PT DURATION: 6 weeks  PLANNED INTERVENTIONS: Therapeutic exercises, Therapeutic activity, Neuromuscular re-education, Patient/Family education, Self Care, and Dry Needling.  PLAN FOR NEXT SESSION:  hold PT at pt's request, recert v/s d/c    Laureen Abrahams, PT, DPT 09/12/22 12:28 PM  Farley Ly PT, MPT

## 2022-08-13 ENCOUNTER — Ambulatory Visit: Payer: PPO | Admitting: Physical Therapy

## 2022-08-13 ENCOUNTER — Encounter: Payer: Self-pay | Admitting: Physical Therapy

## 2022-08-13 DIAGNOSIS — M6281 Muscle weakness (generalized): Secondary | ICD-10-CM | POA: Diagnosis not present

## 2022-08-13 DIAGNOSIS — M546 Pain in thoracic spine: Secondary | ICD-10-CM | POA: Diagnosis not present

## 2022-08-13 DIAGNOSIS — R293 Abnormal posture: Secondary | ICD-10-CM | POA: Diagnosis not present

## 2022-08-15 ENCOUNTER — Other Ambulatory Visit: Payer: Self-pay | Admitting: Obstetrics and Gynecology

## 2022-08-15 DIAGNOSIS — Z1231 Encounter for screening mammogram for malignant neoplasm of breast: Secondary | ICD-10-CM

## 2022-08-18 ENCOUNTER — Encounter: Payer: PPO | Admitting: Rehabilitative and Restorative Service Providers"

## 2022-08-25 ENCOUNTER — Encounter: Payer: Self-pay | Admitting: Internal Medicine

## 2022-08-27 ENCOUNTER — Encounter: Payer: PPO | Admitting: Physical Therapy

## 2022-08-28 NOTE — Telephone Encounter (Signed)
Patient is calling to setup EKG Dr. Lovena Le requested. Please advise.

## 2022-09-03 ENCOUNTER — Ambulatory Visit: Payer: PPO | Attending: Interventional Cardiology

## 2022-09-03 VITALS — BP 106/64 | HR 67 | Ht 67.0 in | Wt 132.0 lb

## 2022-09-03 DIAGNOSIS — R002 Palpitations: Secondary | ICD-10-CM

## 2022-09-03 DIAGNOSIS — I4891 Unspecified atrial fibrillation: Secondary | ICD-10-CM | POA: Diagnosis not present

## 2022-09-03 NOTE — Progress Notes (Signed)
   Nurse Visit   Date of Encounter: 09/03/2022 ID: Kellie Humphrey, DOB 20-Sep-1946, MRN [id number]  PCP:  Ginger Organ., MD   [facility] Cardiologist:  Freada Bergeron, MD Electrophysiologist:  Cristopher Peru, MD      Visit Details   VS:  BP 106/64   Pulse 67   Ht '5\' 7"'$  (1.702 m)   Wt 132 lb (59.9 kg)   SpO2 98%   BMI 20.67 kg/m  , BMI Body mass index is 20.67 kg/m.  Wt Readings from Last 3 Encounters:  09/03/22 132 lb (59.9 kg)  08/05/22 130 lb 6.4 oz (59.1 kg)  06/05/22 132 lb (59.9 kg)     Reason for visit: EKG increase in flecainide to 75 mg BID Performed today: Vitals, EKG, Provider consulted:Dr. Tamala Julian, and Education Changes (medications, testing, etc.) : No changes, patient will continue on current dose of flecainide 75 mg BID. Patient denies any issues and will call with any concerns. Length of Visit: 10 minutes    Medications Adjustments/Labs and Tests Ordered: Orders Placed This Encounter  Procedures   EKG 12-Lead   No orders of the defined types were placed in this encounter.    Signed, Michaelyn Barter, RN  09/03/2022 2:47 PM

## 2022-09-03 NOTE — Patient Instructions (Signed)
Medication Instructions:  Your physician recommends that you continue on your current medications as directed. Please refer to the Current Medication list given to you today.  *If you need a refill on your cardiac medications before your next appointment, please call your pharmacy*  Lab Work: If you have labs (blood work) drawn today and your tests are completely normal, you will receive your results only by: [place] (if you have MyChart) OR A paper copy in the mail If you have any lab test that is abnormal or we need to change your treatment, we will call you to review the results.  Follow-Up: At [facility], you and your health needs are our priority.  As part of our continuing mission to provide you with exceptional heart care, we have created designated Provider Care Teams.  These Care Teams include your primary Cardiologist (physician) and Advanced Practice Providers (APPs -  Physician Assistants and Nurse Practitioners) who all work together to provide you with the care you need, when you need it.  We recommend signing up for the patient portal called "MyChart".  Sign up information is provided on this After Visit Summary.  MyChart is used to connect with patients for Virtual Visits (Telemedicine).  Patients are able to view lab/test results, encounter notes, upcoming appointments, etc.  Non-urgent messages can be sent to your provider as well.   To learn more about what you can do with MyChart, go to [email or web address].    Your next appointment:   6 months- please call to schedule  The format for your next appointment:   In Person  Provider:   You may see Cristopher Peru, MD or one of the following Advanced Practice Providers on your designated Care Team:   Tommye Standard, [place] Legrand Como "Jonni Sanger" Chalmers Cater, [place]     Important Information About Sugar

## 2022-09-22 DIAGNOSIS — N390 Urinary tract infection, site not specified: Secondary | ICD-10-CM | POA: Diagnosis not present

## 2022-10-06 DIAGNOSIS — L570 Actinic keratosis: Secondary | ICD-10-CM | POA: Diagnosis not present

## 2022-10-06 DIAGNOSIS — L718 Other rosacea: Secondary | ICD-10-CM | POA: Diagnosis not present

## 2022-10-06 DIAGNOSIS — Z85828 Personal history of other malignant neoplasm of skin: Secondary | ICD-10-CM | POA: Diagnosis not present

## 2022-10-06 DIAGNOSIS — L738 Other specified follicular disorders: Secondary | ICD-10-CM | POA: Diagnosis not present

## 2022-10-06 DIAGNOSIS — Z85820 Personal history of malignant melanoma of skin: Secondary | ICD-10-CM | POA: Diagnosis not present

## 2022-10-10 ENCOUNTER — Telehealth: Payer: Self-pay | Admitting: Internal Medicine

## 2022-10-10 ENCOUNTER — Telehealth: Payer: Self-pay

## 2022-10-10 MED ORDER — FLECAINIDE ACETATE 50 MG PO TABS: 75.0000 mg | ORAL_TABLET | Freq: Two times a day (BID) | ORAL | 5 refills | Status: DC

## 2022-10-10 NOTE — Telephone Encounter (Signed)
Concern addressed, Pt contacted, See RN note.

## 2022-10-10 NOTE — Telephone Encounter (Signed)
Called Pt back regarding message received in [facility] triage.    Dr. Lovena Le saw Pt in clinic on 08/05/22 and up titrated Flecainide to 75 mg BID.  This was assessed with an RN visit at [facility] on [address] on 09/03/2022.    Medication order copied into note on 10/10/22 incorrect, as Pt taking 1 1/2 tablets ( 75 mg ) two times daily, not once daily in note.    Called patient to verify how she is taking her Flecainide, and Pt IS taking Flecainide as ordered by Dr. Lovena Le.  Pt stated she is running out of this medication already, and requested refill.    Medication refill [facility] to [facility]acy[facility] [place] 

## 2022-10-10 NOTE — Telephone Encounter (Signed)
  Patient states that Dr Lovena Le changed her dosage of the medication below and she needs a new script written for the new dosage   *STAT* If patient is at the pharmacy, call can be transferred to refill team.   1. Which medications need to be refilled? (please list name of each medication and dose if known)   FLECAINIDE ACETATE PO 50 mg- take 1 1/2 tablets daily ('75mg'$ )  2. Which pharmacy/location (including street and city if local pharmacy) is medication to be sent to?  [id number] [place] [place] - [address] [place]   3. Do they need a 30 day or 90 day supply? 90 days

## 2022-10-10 NOTE — Telephone Encounter (Signed)
Pt requesting a refill on Flecainide. This medication was not order properly. Pt states that Dr. Lovena Le increased medication. Please address

## 2022-10-12 ENCOUNTER — Encounter: Payer: Self-pay | Admitting: Internal Medicine

## 2022-10-13 ENCOUNTER — Other Ambulatory Visit: Payer: Self-pay

## 2022-10-13 MED ORDER — FLECAINIDE ACETATE 50 MG PO TABS: 75.0000 mg | ORAL_TABLET | Freq: Two times a day (BID) | ORAL | 3 refills | Status: DC

## 2022-10-14 ENCOUNTER — Telehealth: Payer: Self-pay

## 2022-10-14 MED ORDER — FLECAINIDE ACETATE 50 MG PO TABS: 75.0000 mg | ORAL_TABLET | Freq: Two times a day (BID) | ORAL | 3 refills | Status: DC

## 2022-10-14 NOTE — Telephone Encounter (Signed)
Message received via MyChart, Pt requested her Flecainide script 270 tablets with 3 refills.   Unsure why [facility] was not [facility] [address], Pharmacist note included order to fill the prescription this way.  Pt called and made aware of the medication order was re-sent, and should be ready for pick up this evening.  Pt appreciated it.

## 2022-10-15 ENCOUNTER — Ambulatory Visit
Admission: RE | Admit: 2022-10-15 | Discharge: 2022-10-15 | Disposition: A | Discharge status: Home / Self Care | Payer: PPO | Source: Ambulatory Visit | Attending: Obstetrics and Gynecology | Admitting: Obstetrics and Gynecology

## 2022-10-15 DIAGNOSIS — Z1231 Encounter for screening mammogram for malignant neoplasm of breast: Secondary | ICD-10-CM

## 2022-10-29 ENCOUNTER — Ambulatory Visit (INDEPENDENT_AMBULATORY_CARE_PROVIDER_SITE_OTHER): Payer: PPO

## 2022-10-29 ENCOUNTER — Ambulatory Visit: Payer: PPO | Admitting: Orthopedic Surgery

## 2022-10-29 VITALS — BP 117/77

## 2022-10-29 DIAGNOSIS — M5442 Lumbago with sciatica, left side: Secondary | ICD-10-CM

## 2022-10-29 DIAGNOSIS — G8929 Other chronic pain: Secondary | ICD-10-CM

## 2022-10-29 NOTE — Progress Notes (Signed)
Orthopedic Spine Surgery Office Note  Assessment: Patient is a 77 y.o. female with lower cervical and lower back pain (chronic). No radicular symptoms.  MRIs of cervical and lumbar did not show significant stenosis   Plan: -Explained that initially conservative treatment is tried as a significant number of patients may experience relief with these treatment modalities. Discussed that the conservative treatments include:  -activity modification  -physical therapy  -over the counter pain medications  -medrol dosepak  -cervical steroid injections -Patient has tried Physical therapy, NSAIDs, Tylenol, home exercises, heating pad, medial branch blocks -Recommended tylenol, ibuprofen, core strengthening (exercises provided) 3-4 times per week, heating pad -If she does not get adequate relief, could consider L5/S1 facet injection but told her this would not take care of all of her pain and she did not get lasting relief with medial branch blocks -If her pain does not improve with these treatments, will refer to pain management -Patient should return to office on an as needed basis   Patient expressed understanding of the plan and all questions were answered to the patient's satisfaction.   ___________________________________________________________________________   History:  Patient is a 77 y.o. female who presents today for cervical and lumbar spine.  Patient has a several year history of lower neck and lower back pain.  She states that she feels pain on a daily basis.  Sometimes it is the lower part of her neck and other times it is the lower back.  Occasionally, the pain radiates from her lower neck all the way down to her lower back.  She has no pain radiating into either upper extremity or lower extremity.  Pain is generally worse if she is upright or is active.  It does improve if she lays down.  She has not gotten any lasting relief with the conservative treatments she has tried.  Sometimes  gets paresthesias and numbness radiating into her bilateral shoulder blades.  No other numbness or paresthesias.   Weakness: denies Difficulty with fine motor skills (e.g., buttoning shirts, handwriting): denies Symptoms of imbalance: denies Paresthesias and numbness: yes into bilateral shoulder blades, no other numbness or paresthesias Bowel or bladder incontinence: denies Saddle anesthesia: denies  Treatments tried: Physical therapy, NSAIDs, Tylenol, home exercises, heating pad, medial branch blocks  Review of systems: Denies fevers and chills, night sweats, unexplained weight loss, history of cancer, pain that wakes them at night  Past medical history: Breast cancer Neuropathy Chronic back pain PACs Hypothyroidism Anxiety Osteoarthritis Interstitial cystitis RLS  Allergies: Codeine, Phenergan, Betadine, oxycodone  Past surgical history:  Breast lumpectomy Mitral valve repair C5-6 Salpingo-oophorectomy Bilateral cataract surgery D&C of uterus Arm hemangioma excision Shoulder arthroscopy Tonsillectomy  Social history: Denies use of nicotine product (smoking, vaping, patches, smokeless) Alcohol use: denies Denies recreational drug use  Physical Exam:  General: no acute distress, appears stated age Neurologic: alert, answering questions appropriately, following commands Respiratory: unlabored breathing on room air, symmetric chest rise Psychiatric: appropriate affect, normal cadence to speech   MSK (spine):  -Strength exam      Left  Right Grip strength                5/5  5/5 Interosseus   5/5   5/5 Wrist extension  5/5  5/5 Wrist flexion   5/5  5/5 Elbow flexion   5/5  5/5 Deltoid    5/5  5/5  EHL    5/5  5/5 TA    5/5  5/5 GSC    5/5  5/5 Knee extension  5/5  5/5 Hip flexion   5/5  5/5  -Sensory exam    Sensation intact to light touch in L3-S1 nerve distributions of bilateral lower extremities  Sensation intact to light touch in C5-T1 nerve  distributions of bilateral upper extremities  -Brachioradialis DTR: 1/4 on the left, 1/4 on the right -Biceps DTR: 1/4 on the left, 1/4 on the right -Achilles DTR: 1/4 on the left, 1/4 on the right -Patellar tendon DTR: 1/4 on the left, 1/4 on the right  -Spurling: negative bilaterally -Hoffman sign: negative bilaterally -Clonus: no beats bilaterally -Interosseous wasting: none seen -Grip and release test: negative -Gait: normal -Imbalance with tandem gait: no  Imaging: XR of the cervical spine from 10/29/2022 was independently reviewed and interpreted, showing spondylolisthesis at C7/T1. Disc angle changes 8 degrees between flexion and extension. It shifts about 75m between flexion and extension views. Prior cervical fusion with anterior instrumentation at C5/6. There appears to be bridging bone across that disc space. There is less than 126mof change in the interspinous distance between C5 and C6 in flexion/extension views. No fracture or dislocation.   MRI of the cervical spine form 11/2020 did not show any significant central stenosis. Right sided C3/4 foraminal stenosis but no other significant foraminal stenosis.   XR of the lumbar spine from 10/29/2022 was independently reviewed and interpreted, showing no fracture or dislocation. Disc height loss at L2/3, L3/4, and L5/S1. No evidence of instability on flexion/extension views.   MRI of the lumbar spine from 10/2020 shows DDD at L2/3, L3/4, and L5/S1. Foraminal stenosis at L5/S1. Lateral recess stenosis at L4/5 and L5/S1.   Patient name: Kellie Kageatient MRN: [id number] of visit: 10/29/22

## 2022-10-30 DIAGNOSIS — Z6820 Body mass index (BMI) 20.0-20.9, adult: Secondary | ICD-10-CM | POA: Diagnosis not present

## 2022-10-30 DIAGNOSIS — Z01419 Encounter for gynecological examination (general) (routine) without abnormal findings: Secondary | ICD-10-CM | POA: Diagnosis not present

## 2022-10-30 DIAGNOSIS — N952 Postmenopausal atrophic vaginitis: Secondary | ICD-10-CM | POA: Diagnosis not present

## 2022-11-04 ENCOUNTER — Encounter: Payer: Self-pay | Admitting: Cardiology

## 2022-11-04 ENCOUNTER — Telehealth: Payer: Self-pay | Admitting: Cardiology

## 2022-11-04 MED ORDER — AMOXICILLIN 500 MG PO TABS: 2000.0000 mg | ORAL_TABLET | Freq: Once | ORAL | 0 refills | Status: DC

## 2022-11-04 NOTE — Telephone Encounter (Signed)
Discussed the use of prophylactic antibiotics before dental work and other surgeries. Prescription given for Amoxicillin 2 grams orally one hour before procedure.  Pt aware that she will need pre-med SBE prophylaxis amoxicillin 2 grams (2,000 mg total) by mouth one hour prior to dental procedure/cleaning.  Pt aware that I sent this into her pharmacy of choice.    Pt verbalized understanding and agrees with this plan.

## 2022-11-04 NOTE — Telephone Encounter (Signed)
Patient called stating last time she saw Dr. Johney Frame, she recommended patient pre-med prior to dental procedures.  Patient states her dentist doesn't recommend that anymore.  She wants to know what Dr. Johney Frame would like for her to do.  If Dr. Johney Frame wants her to pre-med, she would like the script to be sent to [id number]- [place], [place] - [address]

## 2022-11-06 NOTE — Telephone Encounter (Signed)
Freada Bergeron, MD  You22 hours ago (8:32 AM)   HP We still recommend it with a MVR. Thank you for sending in!

## 2022-11-13 DIAGNOSIS — E039 Hypothyroidism, unspecified: Secondary | ICD-10-CM | POA: Diagnosis not present

## 2022-11-13 DIAGNOSIS — R002 Palpitations: Secondary | ICD-10-CM | POA: Diagnosis not present

## 2022-11-13 DIAGNOSIS — M8580 Other specified disorders of bone density and structure, unspecified site: Secondary | ICD-10-CM | POA: Diagnosis not present

## 2022-11-20 DIAGNOSIS — E785 Hyperlipidemia, unspecified: Secondary | ICD-10-CM | POA: Diagnosis not present

## 2022-11-20 DIAGNOSIS — Z853 Personal history of malignant neoplasm of breast: Secondary | ICD-10-CM | POA: Diagnosis not present

## 2022-11-20 DIAGNOSIS — G629 Polyneuropathy, unspecified: Secondary | ICD-10-CM | POA: Diagnosis not present

## 2022-11-20 DIAGNOSIS — M5412 Radiculopathy, cervical region: Secondary | ICD-10-CM | POA: Diagnosis not present

## 2022-11-20 DIAGNOSIS — Z0000 Encounter for general adult medical examination without abnormal findings: Secondary | ICD-10-CM | POA: Diagnosis not present

## 2022-11-20 DIAGNOSIS — R82998 Other abnormal findings in urine: Secondary | ICD-10-CM | POA: Diagnosis not present

## 2022-11-20 DIAGNOSIS — I341 Nonrheumatic mitral (valve) prolapse: Secondary | ICD-10-CM | POA: Diagnosis not present

## 2022-11-20 DIAGNOSIS — Z1389 Encounter for screening for other disorder: Secondary | ICD-10-CM | POA: Diagnosis not present

## 2022-11-20 DIAGNOSIS — G250 Essential tremor: Secondary | ICD-10-CM | POA: Diagnosis not present

## 2022-11-20 DIAGNOSIS — E039 Hypothyroidism, unspecified: Secondary | ICD-10-CM | POA: Diagnosis not present

## 2022-11-20 DIAGNOSIS — M8580 Other specified disorders of bone density and structure, unspecified site: Secondary | ICD-10-CM | POA: Diagnosis not present

## 2022-11-20 DIAGNOSIS — I471 Supraventricular tachycardia, unspecified: Secondary | ICD-10-CM | POA: Diagnosis not present

## 2022-11-20 DIAGNOSIS — Z23 Encounter for immunization: Secondary | ICD-10-CM | POA: Diagnosis not present

## 2022-11-20 DIAGNOSIS — I340 Nonrheumatic mitral (valve) insufficiency: Secondary | ICD-10-CM | POA: Diagnosis not present

## 2022-11-20 DIAGNOSIS — I700 Atherosclerosis of aorta: Secondary | ICD-10-CM | POA: Diagnosis not present

## 2022-11-20 DIAGNOSIS — Z1331 Encounter for screening for depression: Secondary | ICD-10-CM | POA: Diagnosis not present

## 2022-12-04 ENCOUNTER — Other Ambulatory Visit: Payer: Self-pay | Admitting: Internal Medicine

## 2022-12-04 DIAGNOSIS — E785 Hyperlipidemia, unspecified: Secondary | ICD-10-CM

## 2022-12-11 ENCOUNTER — Encounter: Payer: Self-pay | Admitting: Orthopedic Surgery

## 2022-12-11 DIAGNOSIS — M47816 Spondylosis without myelopathy or radiculopathy, lumbar region: Secondary | ICD-10-CM

## 2022-12-19 ENCOUNTER — Ambulatory Visit
Admission: RE | Admit: 2022-12-19 | Discharge: 2022-12-19 | Disposition: A | Discharge status: Home / Self Care | Payer: No Typology Code available for payment source | Source: Ambulatory Visit | Attending: Internal Medicine | Admitting: Internal Medicine

## 2022-12-19 DIAGNOSIS — E785 Hyperlipidemia, unspecified: Secondary | ICD-10-CM

## 2022-12-22 ENCOUNTER — Telehealth: Payer: Self-pay | Admitting: Physical Medicine and Rehabilitation

## 2022-12-22 NOTE — Telephone Encounter (Signed)
Pt returned call to [place] J to set an appt. Pt phone number is [phone].

## 2022-12-22 NOTE — Telephone Encounter (Signed)
Spoke with patient and scheduled injection for 01/01/23. Patient aware driver needed

## 2023-01-01 ENCOUNTER — Other Ambulatory Visit: Payer: Self-pay

## 2023-01-01 ENCOUNTER — Ambulatory Visit: Payer: PPO | Admitting: Physical Medicine and Rehabilitation

## 2023-01-01 VITALS — BP 117/71 | HR 64

## 2023-01-01 DIAGNOSIS — M47816 Spondylosis without myelopathy or radiculopathy, lumbar region: Secondary | ICD-10-CM | POA: Diagnosis not present

## 2023-01-01 MED ORDER — METHYLPREDNISOLONE ACETATE 80 MG/ML IJ SUSP
40.0000 mg | Freq: Once | INTRAMUSCULAR | Status: AC
  Administered 2023-01-01: 40 mg

## 2023-01-01 NOTE — Patient Instructions (Signed)

## 2023-01-01 NOTE — Progress Notes (Signed)
Functional Pain Scale - descriptive words and definitions  Moderate (4)   Constantly aware of pain, can complete ADLs with modification/sleep marginally affected at times/passive distraction is of no use, but active distraction gives some relief. Moderate range order  Average Pain 6-7   +Driver, -BT, -Dye Allergies.  Lower back pain on left side with no radiation

## 2023-01-07 DIAGNOSIS — L821 Other seborrheic keratosis: Secondary | ICD-10-CM | POA: Diagnosis not present

## 2023-01-07 DIAGNOSIS — Z85820 Personal history of malignant melanoma of skin: Secondary | ICD-10-CM | POA: Diagnosis not present

## 2023-01-07 DIAGNOSIS — Z85828 Personal history of other malignant neoplasm of skin: Secondary | ICD-10-CM | POA: Diagnosis not present

## 2023-01-07 DIAGNOSIS — D2272 Melanocytic nevi of left lower limb, including hip: Secondary | ICD-10-CM | POA: Diagnosis not present

## 2023-01-07 DIAGNOSIS — D2261 Melanocytic nevi of right upper limb, including shoulder: Secondary | ICD-10-CM | POA: Diagnosis not present

## 2023-01-07 DIAGNOSIS — D225 Melanocytic nevi of trunk: Secondary | ICD-10-CM | POA: Diagnosis not present

## 2023-01-07 DIAGNOSIS — D1801 Hemangioma of skin and subcutaneous tissue: Secondary | ICD-10-CM | POA: Diagnosis not present

## 2023-01-12 NOTE — Procedures (Signed)
Lumbar Facet Joint Intra-Articular Injection(s) with Fluoroscopic Guidance  Patient: Kellie Humphrey      Date of Birth: 29-Jul-1946 MRN: [id number] PCP: Cleatis Polka., MD      Visit Date: 01/01/2023   Universal Protocol:    Date/Time: 01/01/2023  Consent Given By: the patient  Position: PRONE   Additional Comments: Vital signs were monitored before and after the procedure. Patient was prepped and draped in the usual sterile fashion. The correct patient, procedure, and site was verified.   Injection Procedure Details:  Procedure Site One Meds Administered:  Meds ordered this encounter  Medications   methylPREDNISolone acetate (DEPO-MEDROL) injection 40 mg     Laterality: Bilateral  Location/Site:  L5-S1  Needle size: 22 guage  Needle type: Spinal  Needle Placement: Articular  Findings:  -Comments: Excellent flow of contrast producing a partial arthrogram.  Procedure Details: The fluoroscope beam is vertically oriented in AP, and the inferior recess is visualized beneath the lower pole of the inferior apophyseal process, which represents the target point for needle insertion. When direct visualization is difficult the target point is located at the medial projection of the vertebral pedicle. The region overlying each aforementioned target is locally anesthetized with a 1 to 2 ml. volume of 1% Lidocaine without Epinephrine.   The spinal needle was inserted into each of the above mentioned facet joints using biplanar fluoroscopic guidance. A 0.25 to 0.5 ml. volume of Isovue-250 was injected and a partial facet joint arthrogram was obtained. A single spot film was obtained of the resulting arthrogram.    One to 1.25 ml of the steroid/anesthetic solution was then injected into each of the facet joints noted above.   Additional Comments:  No complications occurred Dressing: 2 x 2 sterile gauze and Band-Aid    Post-procedure details: Patient was observed during  the procedure. Post-procedure instructions were reviewed.  Patient left the clinic in stable condition.

## 2023-01-12 NOTE — Progress Notes (Signed)
Kellie Humphrey - 77 y.o. female MRN [id number]  Date of birth: 1945/12/10  Office Visit Note: Visit Date: 01/01/2023 PCP: Cleatis Polka., MD Referred by: Cleatis Polka., MD  Subjective: Chief Complaint  Patient presents with   Lower Back - Pain   HPI:  Kellie Humphrey is a 77 y.o. female who comes in today at the request of Dr. Willia Craze for planned Bilateral  L5-S1 Lumbar facet/medial branch block with fluoroscopic guidance.  The patient has failed conservative care including home exercise, medications, time and activity modification.  This injection will be diagnostic and hopefully therapeutic.  Please see requesting physician notes for further details and justification.  Exam has shown concordant pain with facet joint loading.  I like to do intra-articular injections that she really cannot remember the last time we did these a couple years ago if it gave her much relief.  At that time we did medial branch blocks with pain diary and she just really did not understand the procedure itself. Again the issues with lumbar facet joint injections and ablation techniques.   ROS Otherwise per HPI.  Assessment & Plan: Visit Diagnoses:    ICD-10-CM   1. Spondylosis without myelopathy or radiculopathy, lumbar region  M47.816 XR C-ARM NO REPORT    Facet Injection    methylPREDNISolone acetate (DEPO-MEDROL) injection 40 mg      Plan: No additional findings.   Meds & Orders:  Meds ordered this encounter  Medications   methylPREDNISolone acetate (DEPO-MEDROL) injection 40 mg    Orders Placed This Encounter  Procedures   Facet Injection   XR C-ARM NO REPORT    Follow-up: Return for visit to requesting provider as needed.   Procedures: No procedures performed  Lumbar Facet Joint Intra-Articular Injection(s) with Fluoroscopic Guidance  Patient: Kellie Humphrey      Date of Birth: [birth year] MRN: [id number] PCP: Cleatis Polka., MD      Visit Date:  01/01/2023   Universal Protocol:    Date/Time: 01/01/2023  Consent Given By: the patient  Position: PRONE   Additional Comments: Vital signs were monitored before and after the procedure. Patient was prepped and draped in the usual sterile fashion. The correct patient, procedure, and site was verified.   Injection Procedure Details:  Procedure Site One Meds Administered:  Meds ordered this encounter  Medications   methylPREDNISolone acetate (DEPO-MEDROL) injection 40 mg     Laterality: Bilateral  Location/Site:  L5-S1  Needle size: 22 guage  Needle type: Spinal  Needle Placement: Articular  Findings:  -Comments: Excellent flow of contrast producing a partial arthrogram.  Procedure Details: The fluoroscope beam is vertically oriented in AP, and the inferior recess is visualized beneath the lower pole of the inferior apophyseal process, which represents the target point for needle insertion. When direct visualization is difficult the target point is located at the medial projection of the vertebral pedicle. The region overlying each aforementioned target is locally anesthetized with a 1 to 2 ml. volume of 1% Lidocaine without Epinephrine.   The spinal needle was inserted into each of the above mentioned facet joints using biplanar fluoroscopic guidance. A 0.25 to 0.5 ml. volume of Isovue-250 was injected and a partial facet joint arthrogram was obtained. A single spot film was obtained of the resulting arthrogram.    One to 1.25 ml of the steroid/anesthetic solution was then injected into each of the facet joints noted above.   Additional Comments:  No  complications occurred Dressing: 2 x 2 sterile gauze and Band-Aid    Post-procedure details: Patient was observed during the procedure. Post-procedure instructions were reviewed.  Patient left the clinic in stable condition.    Clinical History: NEUROIMAGING REPORT      STUDY DATE: 11/07/20  PATIENT NAME: Kellie Humphrey  DOB: 1946/09/01  MRN: [id number]    ORDERING CLINICIAN: Anson Fret, MD  CLINICAL HISTORY: 77 year old female with low back pain radiating to the left leg.    EXAM: MR LUMBAR SPINE WO CONTRAST  TECHNIQUE: MRI of the lumbar spine was obtained utilizing 4 mm sagittal slices from T11-12 down to the lower sacrum with T1, T2 and inversion recovery views. In addition 4 mm axial slices from L1-2 down to L5-S1 level were included with T1 and T2 weighted views.  CONTRAST: None  COMPARISON: None  IMAGING SITE: [facility] [address] (1.5 Tesla MRI) acute    FINDINGS:    On sagittal views the vertebral bodies have normal height and alignment.  Mild scoliosis convex left centered at L3-4 level.  Mild disc bulging and spondylosis at L2-3, L3-4 and L5-S1.  The conus medullaris terminates at the level of L1.     On axial views:  T12-L1: No spinal stenosis or foraminal narrowing  L1-2: No spinal stenosis or foraminal narrowing  L2-3: Disc bulging and facet hypertrophy with mild bilateral foraminal stenosis  L3-4: Disc bulging and facet hypertrophy with no spinal stenosis or foraminal narrowing  L4-5: Disc bulging and facet hypertrophy and ligamentum flavum hypertrophy with no spinal stenosis or foraminal narrowing  L5-S1: Disc bulging, facet and ligamentum flavum hypertrophy with moderate bilateral foraminal stenosis and lateral recess stenosis    Limited views of the aorta, kidneys, iliopsoas muscles and sacroiliac joints are unremarkable.      IMPRESSION:    MRI lumbar spine without contrast demonstrating:  - At L5-S1: Disc bulging, facet and ligamentum flavum hypertrophy with moderate bilateral foraminal stenosis and lateral recess stenosis  - At L2-3: Disc bulging and facet hypertrophy with mild bilateral foraminal stenosis.        INTERPRETING PHYSICIAN:  Suanne Marker, MD     Objective:  VS:  HT:    WT:   BMI:     BP:117/71  HR:64bpm  TEMP:  ( )  RESP:  Physical Exam Vitals and nursing note reviewed.  Constitutional:      General: She is not in acute distress.    Appearance: Normal appearance. She is not ill-appearing.  HENT:     Head: Normocephalic and atraumatic.     Right Ear: External ear normal.     Left Ear: External ear normal.  Eyes:     Extraocular Movements: Extraocular movements intact.  Cardiovascular:     Rate and Rhythm: Normal rate.     Pulses: Normal pulses.  Pulmonary:     Effort: Pulmonary effort is normal. No respiratory distress.  Abdominal:     General: There is no distension.     Palpations: Abdomen is soft.  Musculoskeletal:        General: Tenderness present.     Cervical back: Neck supple.     Right lower leg: No edema.     Left lower leg: No edema.     Comments: Patient has good distal strength with no pain over the greater trochanters.  No clonus or focal weakness.  Skin:    Findings: No erythema, lesion or rash.  Neurological:  General: No focal deficit present.     Mental Status: She is alert and oriented to person, place, and time.     Sensory: No sensory deficit.     Motor: No weakness or abnormal muscle tone.     Coordination: Coordination normal.  Psychiatric:        Mood and Affect: Mood normal.        Behavior: Behavior normal.      Imaging: No results found.

## 2023-01-14 ENCOUNTER — Ambulatory Visit (INDEPENDENT_AMBULATORY_CARE_PROVIDER_SITE_OTHER): Payer: PPO | Admitting: Orthopedic Surgery

## 2023-01-14 DIAGNOSIS — M542 Cervicalgia: Secondary | ICD-10-CM

## 2023-01-14 NOTE — Progress Notes (Signed)
Orthopedic Spine Surgery Office Note  Assessment: Patient is a 77 y.o. female with lower cervical and lumbar back pain.  No radicular symptoms.  No significant stenosis on MRI of the lumbar cervical spine.  Has not gotten relief with conservative treatments   Plan: -Patient has tried PT, NSAIDs, Tylenol, home exercises, heating pad, medial branch blocks, lumbar facet injection -Patient has persistent pain but no significant pathology on MRI, so recommended pain management.  She is not interested in narcotics but I told her that they could explore other options like Cymbalta, Lyrica, etc. -I told her there is mild foraminal stenosis in her cervical spine on the right at C3/4 but I am not sure surgical decompression would provide her with significant relief since there is still appears to be adequate space for the exiting nerve root -Patient should return to office on an as-needed basis   Patient expressed understanding of the plan and all questions were answered to the patient's satisfaction.   __________________________________________________________________________  History: Patient is a 77 y.o. female who has been previously seen in the office for lower cervical and lower lumbar back pain.  After her last visit, recommended L5/S1 facet injections.  She got those done but did not get any significant relief.  Still having low back pain that does not radiate into either lower extremity.  Is worse with activity improves with rest.  Her worst pain right now though is in her lower cervical spine and she feels that radiates in the bilateral trapezius muscles.  She describes it as a burning sensation.  It does not radiate past the shoulder.  Sometimes gets paresthesias in the left trapezius.  No other numbness or paresthesias.  Previous treatments: PT, NSAIDs, Tylenol, home exercises, heating pad, medial branch blocks, lumbar facet injection  Physical Exam:  General: no acute distress, appears  stated age Neurologic: alert, answering questions appropriately, following commands Respiratory: unlabored breathing on room air, symmetric chest rise Psychiatric: appropriate affect, normal cadence to speech   MSK (spine):   -Strength exam                                                   Left                  Right Grip strength                5/5                  5/5 Interosseus                  5/5                  5/5 Wrist extension            5/5                  5/5 Wrist flexion                 5/5                  5/5 Elbow flexion                5/5                  5/5 Deltoid  5/5                  5/5   EHL                              5/5                  5/5 TA                                 5/5                  5/5 GSC                             5/5                  5/5 Knee extension            5/5                  5/5 Hip flexion                    5/5                  5/5   -Sensory exam                           Sensation intact to light touch in L3-S1 nerve distributions of bilateral lower extremities             Sensation intact to light touch in C5-T1 nerve distributions of bilateral upper extremities   -Brachioradialis DTR: 1/4 on the left, 1/4 on the right -Biceps DTR: 1/4 on the left, 1/4 on the right -Achilles DTR: 1/4 on the left, 1/4 on the right -Patellar tendon DTR: 1/4 on the left, 1/4 on the right   -Spurling: negative bilaterally -Hoffman sign: negative bilaterally -Clonus: no beats bilaterally -Interosseous wasting: none seen -Gait: normal -Negative Lhermitte sign  -Left shoulder exam: No pain through range of motion, negative Jobe, no weakness with external rotation with arm at side, negative belly press -Right shoulder exam: No pain through range of motion, negative Jobe, negative belly press, no weakness with external rotation with arm at side  Imaging: XR of the cervical spine from 10/29/2022 was previously  independently reviewed and interpreted, showing spondylolisthesis at C7/T1. Disc angle changes 8 degrees between flexion and extension. It shifts about 1mm between flexion and extension views. Prior cervical fusion with anterior instrumentation at C5/6. There appears to be bridging bone across that disc space. There is less than 1mm of change in the interspinous distance between C5 and C6 in flexion/extension views. No fracture or dislocation.    MRI of the cervical spine form 11/2020 did not show any significant central stenosis. Right sided C3/4 foraminal stenosis but no other significant foraminal stenosis.    XR of the lumbar spine from 10/29/2022 was previously independently reviewed and interpreted, showing no fracture or dislocation. Disc height loss at L2/3, L3/4, and L5/S1. No evidence of instability on flexion/extension views.    MRI of the lumbar spine from 10/2020 shows DDD at L2/3, L3/4, and L5/S1. Foraminal stenosis at L5/S1. Lateral recess stenosis at L4/5 and L5/S1.     Patient name: Kellie Picazo  Humphrey Patient MRN: [id number] Date of visit: 01/14/23

## 2023-02-23 NOTE — Progress Notes (Unsigned)
Office Visit    Patient Name: Kellie Humphrey Date of Encounter: 02/24/2023  PCP:  Cleatis Polka., MD   [facility]  Cardiologist:  Meriam Sprague, MD  Advanced Practice Provider:  No care team member to display Electrophysiologist:  Lewayne Bunting, MD   Chief Complaint    Kellie Humphrey is a 77 y.o. female with a past medical history significant for MVP with TTE 05/2019 with severe MR, EF 60 to 65% status post minimally invasive mitral valve repair on Feb 10, 2020 with Dr. Cornelius Moras, PACs, anxiety, atrial fibrillation and hypothyroidism presents today for overdue follow-up visit.   After her mitral valve repair in 2021, her postop course was complicated by atrial fibrillation with cardioversion after initiation of IV amiodarone.  Sent home on amiodarone 200 mg p.o. twice daily.  Flecainide that was previously started for frequent PVCs and PACs were discontinued at that time but have been since resumed since patient was off amiodarone.  Seen by Dr. Delton See 2/22 and she was doing well.  Palpitations improved.  She was walking 3 to 4 miles a day without symptoms.  Euvolemic on exam.  TTE 6/21 with LVEF 65%, MV ring in place mildly elevated mean gradient of 7 mmHg, trivial MR, normal PASP, mild to moderate TR, RAP 3 mmHg.  Last seen in the office 11/22 she was doing well at that time.  She is walking 3 miles a day without any exertional symptoms.  She was having palpitations 3-4 times a day.  She described palpitations as a fluttering in her chest.  They did not bother her.  Amiodarone was discontinued but she was still on flecainide to manage the palpitations.  No other symptoms.  She was seen by me 04/28/2022, she states that she has had an increase in palpitations here recently.  They have been well controlled on flecainide but are becoming more problematic.  Palpitations are random and last hours.  She will have 4-7 regular beats and then a skipped beat.   Sometimes she feels like she has fluttering in her chest.  Her Apple Watch picked up "atrial fibrillation".  It is difficult to tell if this is true atrial fibrillation on the tracings.  She states that her heart rate never felt fast.  She does have a history of mitral valve prolapse but underwent a mitral valve repair back in 6/21.  No recent echocardiogram. Sometimes see spots when walking outside but they don't last long.   She was seen by Dr. Ladona Ridgel 08/05/2022.  She explained to him that on a higher dose of flecainide her palpitations were worse.  Her metoprolol was uptight rated and it helped a little bit.  Encouraged to take an additional metoprolol as needed.  Today, she tells me that her flecainide is difficult to split.  She plans to talk to her pharmacy about a different pill.  She is still walking 2 to 3 miles a day.  When she walks uphill sometimes she gets lightheaded like she is going to pass out.  She states she sees spots.  When she stops this sensation goes away.  She states this has been happening over the last few months.  Recently had an ultrasound of her neck by her primary which I cannot see.  She tells me she thinks the results were normal and they did it to see if she needs a cholesterol medicine.  She was not started on any cholesterol medicine at that  time.  Reports no shortness of breath nor dyspnea on exertion. Reports no chest pain, pressure, or tightness. No edema, orthopnea, PND. Reports no palpitations.    Past Medical History    Past Medical History:  Diagnosis Date   Anxiety disorder    Arthritis    Cataract    Essential tremor    /familial   Heart murmur 2021   corrected with valve surgery   Hypothyroidism    Interstitial cystitis    Malignant neoplasm of lower-outer quadrant of right breast of female, estrogen receptor positive (HCC) 02/11/2012   Neuropathy    Nonrheumatic mitral valve regurgitation    PAC (premature atrial contraction)    Paresthesias  07/29/2014   PREMATURE ATRIAL CONTRACTIONS 05/08/2009   Qualifier: Diagnosis of  By: Trevor Iha, RN, [name]     Radiculopathy, cervical region    RLS (restless legs syndrome)    S/P minimally-invasive mitral valve repair 02/10/2020   Complex valvuloplasty including artificial Gore-tex neochord placement x8 with edge-to-edge suture plication of anterior commissure and 32 mm [name] 4D ring annuloplasty via right mini thoracotomy approach   Thyroid disease    Varicose veins of right lower extremity with complications 03/25/2016   Past Surgical History:  Procedure Laterality Date   bilateral cataract repair  july/aug 2021   Dr Darel Hong   BREAST BIOPSY  2009   BREAST LUMPECTOMY  2010   CARDIAC CATHETERIZATION  2021   DILATION AND CURETTAGE OF UTERUS     age 46   EYE SURGERY Left    Pterygium   HEMANGIOMA EXCISION Left 1990   arm   MELANOMA EXCISION Right 1980   thigh   MITRAL VALVE REPAIR Right 02/10/2020   Procedure: MINIMALLY INVASIVE MITRAL VALVE REPAIR (MVR) using Memo 4D 32 MM Mitral Valve Ring.;  Surgeon: Purcell Nails, MD;  Location: [facility] OR;  Service: Open Heart Surgery;  Laterality: Right;   NECK SURGERY  10/2009   C5-6 fusion   RIGHT/LEFT HEART CATH AND CORONARY ANGIOGRAPHY N/A 01/04/2020   Procedure: RIGHT/LEFT HEART CATH AND CORONARY ANGIOGRAPHY;  Surgeon: Kathleene Hazel, MD;  Location: [facility] INVASIVE CV LAB;  Service: Cardiovascular;  Laterality: N/A;   ROBOTIC ASSISTED BILATERAL SALPINGO OOPHERECTOMY Bilateral 10/01/2021   Procedure: XI ROBOTIC ASSISTED BILATERAL SALPINGO OOPHORECTOMY;  Surgeon: Carver Fila, MD;  Location: [facility] ORS;  Service: Gynecology;  Laterality: Bilateral;   SHOULDER ARTHROSCOPY Left 2016   spider vein treatment     TEE WITHOUT CARDIOVERSION N/A 06/30/2018   Procedure: TRANSESOPHAGEAL ECHOCARDIOGRAM (TEE);  Surgeon: Quintella Reichert, MD;  Location: [facility] ENDOSCOPY;  Service: Cardiovascular;  Laterality: N/A;   TEE WITHOUT CARDIOVERSION N/A  12/12/2019   Procedure: TRANSESOPHAGEAL ECHOCARDIOGRAM (TEE);  Surgeon: Lars Masson, MD;  Location: [facility] ENDOSCOPY;  Service: Cardiovascular;  Laterality: N/A;   TEE WITHOUT CARDIOVERSION N/A 02/10/2020   Procedure: TRANSESOPHAGEAL ECHOCARDIOGRAM (TEE);  Surgeon: Purcell Nails, MD;  Location: [facility] OR;  Service: Open Heart Surgery;  Laterality: N/A;   TONSILLECTOMY     as a child   uterine fibroids removed  2012    Allergies  Allergies  Allergen Reactions   Codeine Nausea And Vomiting   Phenergan [Promethazine] Other (See Comments)    Long QT   Betadine [Povidone Iodine] Other (See Comments)    Irritation - vaginal   Oxycodone Nausea And Vomiting    EKGs/Labs/Other Studies Reviewed:   The following studies were reviewed today:   Zio 02/2020: HR of 64 bpm, max HR of  125 bpm, and avg HR of 73 BPM. Few very shors runs of SVT< the longest lasting 5 beats. Ocassional PACs and PVs. No atrial fibrillation or ventricular tachycardia.   Sinus rhythm to sinus tachycardia. Benign heart monitor. Patient's diary correlates with PVCs.   TTE 02/2020: IMPRESSIONS   1. The first postoperative TTE, LVEF 55-60% (previously 60-65%). Since  the last study there is new mitral valve ring with mildly elevated mean  gradient 7 mmHg and only trivial mitral regurgitation. Normal RVSP.   2. Left ventricular ejection fraction, by estimation, is 55 to 60%. The  left ventricle has normal function. The left ventricle has no regional  wall motion abnormalities. Left ventricular diastolic function could not  be evaluated.   3. Right ventricular systolic function is normal. The right ventricular  size is normal. There is normal pulmonary artery systolic pressure. The  estimated right ventricular systolic pressure is 23.8 mmHg.   4. The mitral valve has been repaired/replaced. Trivial mitral valve  regurgitation. Mild to moderate mitral stenosis. The mean mitral valve  gradient is 7.0 mmHg with  average heart rate of 68 bpm. There is a 32 mm  Memo 4D Mitral Valve Ring present in the   mitral position. Procedure Date: 02/10/2020.   5. Tricuspid valve regurgitation is mild to moderate.   6. The aortic valve is normal in structure. Aortic valve regurgitation is  not visualized. No aortic stenosis is present.   7. The inferior vena cava is normal in size with greater than 50%  respiratory variability, suggesting right atrial pressure of 3 mmHg.   TTE: 05/2019    1. Left ventricular ejection fraction, by visual estimation, is 65 to  70%. The left ventricle has normal function. Normal left ventricular size.  There is no left ventricular hypertrophy.   2. Left ventricular diastolic Doppler parameters are consistent with  impaired relaxation pattern of LV diastolic filling.   3. Global right ventricle has normal systolic function.The right  ventricular size is normal. No increase in right ventricular wall  thickness.   4. Left atrial size was normal.   5. Right atrial size was normal.   6. Moderate to severe mitral valve regurgitation.   7. MV leaflets have myxomatous appearance There is mild prolapse of both  leaflets There is moderate to severe MR.   8. The tricuspid valve is normal in structure. Tricuspid valve  regurgitation is mild.   9. The aortic valve is tricuspid Aortic valve regurgitation was not  visualized by color flow Doppler. Mild aortic valve sclerosis without  stenosis.  10. The pulmonic valve was normal in structure. Pulmonic valve  regurgitation is mild by color flow Doppler.    ETT on flecanide     Study Highlights      Blood pressure demonstrated a normal response to exercise. No T wave inversion was noted during stress. Upsloping ST segment depression ST segment depression of 1 mm was noted during stress in the II, III, aVF, V6, V5 and V4 leads, and returning to baseline after less than 1 minute of recovery. The patient experienced no angina during the  stress test Overall, the patient's exercise capacity was excellent. Duke Treadmill Score: low risk   Negative stress test without evidence of ischemia at given workload. Excellent exercise capacity. Frequent PVC's noted in early recovery.      Echo TEE 06/30/18 - Left ventricle: Systolic function was normal. The estimated   ejection fraction was in the range of 60% to 65%.  Wall motion was   normal; there were no regional wall motion abnormalities. - Aortic valve: Trileaflet; mildly thickened leaflets. There was   trivial regurgitation. - Mitral valve: Moderate, holosystolicprolapse, involving the   anterior leaflet and the posterior leaflet. There was moderate to   severe regurgitation. Effective regurgitant orifice (PISA): 0.22   cm^2. Regurgitant volume (PISA): 42 ml. - Left atrium: No evidence of thrombus in the atrial cavity or   appendage. - Right atrium: The atrium was mildly dilated. No evidence of   thrombus in the atrial cavity or appendage. - Tricuspid valve: There was mild regurgitation. - Pulmonic valve: There was trivial regurgitation.   TTE 06/17/18 Myxomatous, moderately thickened valve with bileaflet prolapse.   Mitral regurgitation jet is central and at least moderate to   severe with reversal of forward flow in the pulmonary veins.   However, left atrial size is normal and so are right sided   pressures. A TEE is recommended for further evaluation.   48 hour Holter 06/09/18   Sinus bradycardia to sinus tachycardia. Infrequent PACs and frequent PVCs (1600 in 48 hrs = 1 % of all beats). SVT runs the longest consisting of 15 beats.   Sinus bradycardia to sinus tachycardia. Infrequent PACs and frequent PVCs (1600 in 48 hrs = 1 % of all beats). SVT runs the longest consisting of 15 beats. Symptoms correlating with PVCs.  EKG:  EKG is  ordered today.  The ekg ordered today demonstrates sinus bradycardia, rate 59 bpm.  Recent Labs: 04/28/2022: Magnesium 2.2  Recent  Lipid Panel No results found for: "CHOL", "TRIG", "HDL", "CHOLHDL", "VLDL", "LDLCALC", "LDLDIRECT"  Risk Assessment/Calculations:   CHA2DS2-VASc Score =     This indicates a  % annual risk of stroke. The patient's score is based upon:        Home Medications   Current Meds  Medication Sig   acetaminophen (TYLENOL) 500 MG tablet Take 500-1,000 mg by mouth See admin instructions. Take 500 mg at night, may take a second 500 -1000 mg dose as needed for pain   aspirin EC 81 MG tablet Take 1 tablet (81 mg total) by mouth daily. Swallow whole.   betamethasone dipropionate 0.05 % cream Apply 1 application. topically 4 (four) times daily.   Biotin 5000 MCG CAPS Take 5,000 mcg by mouth daily.   Cholecalciferol (VITAMIN D) 2000 units tablet Take 2,000 Units by mouth daily.   Cyanocobalamin (B-12) 5000 MCG CAPS Take 5,000 mcg by mouth once a week. Mondays   flecainide (TAMBOCOR) 50 MG tablet Take 1.5 tablets (75 mg total) by mouth 2 (two) times daily. With food.   gabapentin (NEURONTIN) 600 MG tablet Take 600 mg by mouth 2 (two) times daily.   levothyroxine (SYNTHROID) 50 MCG tablet Take 1 tablet (50 mcg total) by mouth daily before breakfast.   melatonin 5 MG TABS Take 5 mg by mouth at bedtime.   metoprolol tartrate (LOPRESSOR) 25 MG tablet Take 1 tablet (25 mg total) by mouth 2 (two) times daily.   metroNIDAZOLE (METROCREAM) 0.75 % cream Apply 1 Application topically 2 (two) times daily.   primidone (MYSOLINE) 50 MG tablet Take 50 mg by mouth at bedtime.   SYSTANE ULTRA 0.4-0.3 % SOLN Place 1 drop into the left eye every 4 (four) hours as needed (dry/irritated eyes. (4-6 times daily)).      Review of Systems      All other systems reviewed and are otherwise negative except as noted above.  Physical Exam  VS:  BP 112/62   Pulse 62   Ht 5\' 7"  (1.702 m)   Wt 133 lb 9.6 oz (60.6 kg)   SpO2 99%   BMI 20.92 kg/m  , BMI Body mass index is 20.92 kg/m.  Wt Readings from Last 3  Encounters:  02/24/23 133 lb 9.6 oz (60.6 kg)  09/03/22 132 lb (59.9 kg)  08/05/22 130 lb 6.4 oz (59.1 kg)     GEN: Well nourished, well developed, in no acute distress. HEENT: normal. Neck: Supple, no JVD, carotid bruits, or masses. Cardiac: RRR, no murmurs, rubs, or gallops. No clubbing, cyanosis, edema.  Radials/PT 2+ and equal bilaterally.  Respiratory:  Respirations regular and unlabored, clear to auscultation bilaterally. GI: Soft, nontender, nondistended. MS: No deformity or atrophy. Skin: Warm and dry, no rash. Neuro:  Strength and sensation are intact. Psych: Normal affect.  Assessment & Plan    MVP with severe MR status post minimally invasive MVR -update echo, now with presyncope symptoms -continue current medications  Postoperative atrial fibrillation -No A-fib on ZIO monitor from September 2023 -Continue flecainide and metoprolol, recently seen by Dr. Ladona Ridgel -patient is aware that if she is having paroxysmal afib she will need to be on anticoagulation -CHA2DS2-VASc score of 2  Symptomatic PVCs/PACs -Continue Flecainide 75 mg twice a day -Continue metoprolol 25 mg twice a day -We have asked her to keep a close eye on her blood pressure since this may be the culprit of her presyncope symptoms  Hyperlipidemia -LDL slightly above goal 2/23 -Lipid panel and LFTs to be drawn at her PCP appointment       Disposition: Follow up  year with Meriam Sprague, MD or APP.  Signed, Sharlene Dory, PA-C 02/24/2023, 12:41 PM [facility]

## 2023-02-24 ENCOUNTER — Ambulatory Visit: Payer: PPO | Attending: Physician Assistant | Admitting: Physician Assistant

## 2023-02-24 ENCOUNTER — Encounter: Payer: Self-pay | Admitting: Physician Assistant

## 2023-02-24 VITALS — BP 112/62 | HR 62 | Ht 67.0 in | Wt 133.6 lb

## 2023-02-24 DIAGNOSIS — E785 Hyperlipidemia, unspecified: Secondary | ICD-10-CM | POA: Diagnosis not present

## 2023-02-24 DIAGNOSIS — R002 Palpitations: Secondary | ICD-10-CM | POA: Diagnosis not present

## 2023-02-24 DIAGNOSIS — R55 Syncope and collapse: Secondary | ICD-10-CM | POA: Diagnosis not present

## 2023-02-24 DIAGNOSIS — I480 Paroxysmal atrial fibrillation: Secondary | ICD-10-CM

## 2023-02-24 DIAGNOSIS — I340 Nonrheumatic mitral (valve) insufficiency: Secondary | ICD-10-CM

## 2023-02-24 DIAGNOSIS — Z9889 Other specified postprocedural states: Secondary | ICD-10-CM | POA: Diagnosis not present

## 2023-02-24 DIAGNOSIS — I781 Nevus, non-neoplastic: Secondary | ICD-10-CM | POA: Diagnosis not present

## 2023-02-24 DIAGNOSIS — I493 Ventricular premature depolarization: Secondary | ICD-10-CM

## 2023-02-24 NOTE — Patient Instructions (Addendum)
Medication Instructions:   Your physician recommends that you continue on your current medications as directed. Please refer to the Current Medication list given to you today.  *If you need a refill on your cardiac medications before your next appointment, please call your pharmacy*   Lab Work: NONE ORDERED  TODAY    If you have labs (blood work) drawn today and your tests are completely normal, you will receive your results only by: MyChart Message (if you have MyChart) OR A paper copy in the mail If you have any lab test that is abnormal or we need to change your treatment, we will call you to review the results.   Testing/Procedures: Your physician has requested that you have an echocardiogram. Echocardiography is a painless test that uses sound waves to create images of your heart. It provides your doctor with information about the size and shape of your heart and how well your heart's chambers and valves are working. This procedure takes approximately one hour. There are no restrictions for this procedure. Please do NOT wear cologne, perfume, aftershave, or lotions (deodorant is allowed). Please arrive 15 minutes prior to your appointment time.    Follow-Up: At [facility], you and your health needs are our priority.  As part of our continuing mission to provide you with exceptional heart care, we have created designated Provider Care Teams.  These Care Teams include your primary Cardiologist (physician) and Advanced Practice Providers (APPs -  Physician Assistants and Nurse Practitioners) who all work together to provide you with the care you need, when you need it.  We recommend signing up for the patient portal called "MyChart".  Sign up information is provided on this After Visit Summary.  MyChart is used to connect with patients for Virtual Visits (Telemedicine).  Patients are able to view lab/test results, encounter notes, upcoming appointments, etc.  Non-urgent  messages can be sent to your provider as well.   To learn more about what you can do with MyChart, go to [email or web address].    Your next appointment: You have been referred to [facility]   1 year(s)  Provider:   Dr. Ronette Deter   Other Instructions Low-Sodium Eating Plan Salt (sodium) helps you keep a healthy balance of fluids in your body. Too much sodium can raise your blood pressure. It can also cause fluid and waste to be held in your body. Your health care provider or dietitian may recommend a low-sodium eating plan if you have high blood pressure (hypertension), kidney disease, liver disease, or heart failure. Eating less sodium can help lower your blood pressure and reduce swelling. It can also protect your heart, liver, and kidneys. What are tips for following this plan? Reading food labels  Check food labels for the amount of sodium per serving. If you eat more than one serving, you must multiply the listed amount by the number of servings. Choose foods with less than 140 milligrams (mg) of sodium per serving. Avoid foods with 300 mg of sodium or more per serving. Always check how much sodium is in a product, even if the label says "unsalted" or "no salt added." Shopping  Buy products labeled as "low-sodium" or "no salt added." Buy fresh foods. Avoid canned foods and pre-made or frozen meals. Avoid canned, cured, or processed meats. Buy breads that have less than 80 mg of sodium per slice. Cooking  Eat more home-cooked food. Try to eat less restaurant, buffet, and fast food. Try not to add salt when  you cook. Use salt-free seasonings or herbs instead of table salt or sea salt. Check with your provider or pharmacist before using salt substitutes. Cook with plant-based oils, such as canola, sunflower, or olive oil. Meal planning When eating at a restaurant, ask if your food can be made with less salt or no salt. Avoid dishes labeled as brined, pickled, cured, or  smoked. Avoid dishes made with soy sauce, miso, or teriyaki sauce. Avoid foods that have monosodium glutamate (MSG) in them. MSG may be added to some restaurant food, sauces, soups, bouillon, and canned foods. Make meals that can be grilled, baked, poached, roasted, or steamed. These are often made with less sodium. General information Try to limit your sodium intake to 1,500-2,300 mg each day, or the amount told by your provider. What foods should I eat? Fruits Fresh, frozen, or canned fruit. Fruit juice. Vegetables Fresh or frozen vegetables. "No salt added" canned vegetables. "No salt added" tomato sauce and paste. Low-sodium or reduced-sodium tomato and vegetable juice. Grains Low-sodium cereals, such as oats, puffed wheat and rice, and shredded wheat. Low-sodium crackers. Unsalted rice. Unsalted pasta. Low-sodium bread. Whole grain breads and whole grain pasta. Meats and other proteins Fresh or frozen meat, poultry, seafood, and fish. These should have no added salt. Low-sodium canned tuna and salmon. Unsalted nuts. Dried peas, beans, and lentils without added salt. Unsalted canned beans. Eggs. Unsalted nut butters. Dairy Milk. Soy milk. Cheese that is naturally low in sodium, such as ricotta cheese, fresh mozzarella, or Swiss cheese. Low-sodium or reduced-sodium cheese. Cream cheese. Yogurt. Seasonings and condiments Fresh and dried herbs and spices. Salt-free seasonings. Low-sodium mustard and ketchup. Sodium-free salad dressing. Sodium-free light mayonnaise. Fresh or refrigerated horseradish. Lemon juice. Vinegar. Other foods Homemade, reduced-sodium, or low-sodium soups. Unsalted popcorn and pretzels. Low-salt or salt-free chips. The items listed above may not be all the foods and drinks you can have. Talk to a dietitian to learn more. What foods should I avoid? Vegetables Sauerkraut, pickled vegetables, and relishes. Olives. [place] fries. Onion rings. Regular canned vegetables,  except low-sodium or reduced-sodium items. Regular canned tomato sauce and paste. Regular tomato and vegetable juice. Frozen vegetables in sauces. Grains Instant hot cereals. Bread stuffing, pancake, and biscuit mixes. Croutons. Seasoned rice or pasta mixes. Noodle soup cups. Boxed or frozen macaroni and cheese. Regular salted crackers. Self-rising flour. Meats and other proteins Meat or fish that is salted, canned, smoked, spiced, or pickled. Precooked or cured meat, such as sausages or meat loaves. Tomasa Blase. Ham. Pepperoni. Hot dogs. Corned beef. Chipped beef. Salt pork. Jerky. Pickled herring, anchovies, and sardines. Regular canned tuna. Salted nuts. Dairy Processed cheese and cheese spreads. Hard cheeses. Cheese curds. Blue cheese. Feta cheese. String cheese. Regular cottage cheese. Buttermilk. Canned milk. Fats and oils Salted butter. Regular margarine. Ghee. Bacon fat. Seasonings and condiments Onion salt, garlic salt, seasoned salt, table salt, and sea salt. Canned and packaged gravies. Worcestershire sauce. Tartar sauce. Barbecue sauce. Teriyaki sauce. Soy sauce, including reduced-sodium soy sauce. Steak sauce. Fish sauce. Oyster sauce. Cocktail sauce. Horseradish that you find on the shelf. Regular ketchup and mustard. Meat flavorings and tenderizers. Bouillon cubes. Hot sauce. Pre-made or packaged marinades. Pre-made or packaged taco seasonings. Relishes. Regular salad dressings. Salsa. Other foods Salted popcorn and pretzels. Corn chips and puffs. Potato and tortilla chips. Canned or dried soups. Pizza. Frozen entrees and pot pies. The items listed above may not be all the foods and drinks you should avoid. Talk to a dietitian to learn  more. This information is not intended to replace advice given to you by your health care provider. Make sure you discuss any questions you have with your health care provider. Document Revised: 10/02/2022 Document Reviewed: 10/02/2022 Elsevier Patient  Education  2024 [employer].

## 2023-03-19 ENCOUNTER — Ambulatory Visit (HOSPITAL_COMMUNITY): Payer: PPO | Attending: Physician Assistant

## 2023-03-19 ENCOUNTER — Encounter (HOSPITAL_COMMUNITY): Payer: Self-pay

## 2023-03-19 DIAGNOSIS — R55 Syncope and collapse: Secondary | ICD-10-CM | POA: Diagnosis not present

## 2023-03-19 LAB — ECHOCARDIOGRAM COMPLETE
Area-P 1/2: 1.86 cm2
S' Lateral: 3.1 cm

## 2023-03-25 ENCOUNTER — Ambulatory Visit: Payer: PPO | Admitting: Physician Assistant

## 2023-03-27 ENCOUNTER — Telehealth: Payer: Self-pay | Admitting: Internal Medicine

## 2023-03-27 DIAGNOSIS — I781 Nevus, non-neoplastic: Secondary | ICD-10-CM

## 2023-03-27 NOTE — Telephone Encounter (Signed)
Pt c/o medication issue:  1. Name of Medication:   flecainide (TAMBOCOR) 50 MG tablet    2. How are you currently taking this medication (dosage and times per day)?  Take 1.5 tablets (75 mg total) by mouth 2 (two) times daily. With food.       3. Are you having a reaction (difficulty breathing--STAT)? No  4. What is your medication issue? Pt is requesting a callback to see if taking an extra tablet will help with her recent pvc's

## 2023-03-27 NOTE — Telephone Encounter (Signed)
Returned call to patient. She states over the last 3-4 days she has been having palpitations/PVCs nearly every day. She states she can feel them, describing feeling 5 beats then a pause, 5 beats then a pause.  She denies any SOB, fatigue, chest pain, dizziness/lightheadedness, or fainting.   She reports HR ranging 60-65. Only checks BP occasionally, does not have any current readings. She reports she is drinking extra fluids with this hot weather and feels she is adequately hydrated. She states she is sleeping well, does not drink caffeine, does not smoke. I mentioned stress/anxiety could cause PVCs. Patient did not comment on stress level.  Patient states she had been taking flecainide 100mg  BID in the past but this did not help and seemed to make palpitations worse.   She is asking if she can take an extra 25mg  of flecainide daily to help with PVCs/palpitations.  Informed patient she could try taking an extra half tablet (12.5mg ) of metoprolol tartrate once a day as needed when she feels the palpitations, and to monitor HR (and BP when able). Also discussed availability of on-call provider if any questions/concerns over the weekend.  Patient verbalized understanding.  Will forward to Dr. Ladona Ridgel to review and advise.

## 2023-03-31 NOTE — Telephone Encounter (Signed)
Normally flecainide works better but sometimes a beta blocker is better. Perhaps she can try taking the beta blocker daily (25 mg in the morning) and see if it helps. Could take a second 25 mg in the evening. This is what I personally do. GT

## 2023-04-08 ENCOUNTER — Other Ambulatory Visit: Payer: Self-pay | Admitting: Internal Medicine

## 2023-04-08 DIAGNOSIS — Z9889 Other specified postprocedural states: Secondary | ICD-10-CM

## 2023-04-08 DIAGNOSIS — Z5181 Encounter for therapeutic drug level monitoring: Secondary | ICD-10-CM

## 2023-04-08 DIAGNOSIS — I480 Paroxysmal atrial fibrillation: Secondary | ICD-10-CM

## 2023-04-10 DIAGNOSIS — H5201 Hypermetropia, right eye: Secondary | ICD-10-CM | POA: Diagnosis not present

## 2023-04-10 DIAGNOSIS — H26491 Other secondary cataract, right eye: Secondary | ICD-10-CM | POA: Diagnosis not present

## 2023-04-15 DIAGNOSIS — R09A2 Foreign body sensation, throat: Secondary | ICD-10-CM | POA: Diagnosis not present

## 2023-04-15 DIAGNOSIS — R1310 Dysphagia, unspecified: Secondary | ICD-10-CM | POA: Diagnosis not present

## 2023-04-15 DIAGNOSIS — K219 Gastro-esophageal reflux disease without esophagitis: Secondary | ICD-10-CM | POA: Diagnosis not present

## 2023-04-20 DIAGNOSIS — H26491 Other secondary cataract, right eye: Secondary | ICD-10-CM | POA: Diagnosis not present

## 2023-04-24 DIAGNOSIS — J189 Pneumonia, unspecified organism: Secondary | ICD-10-CM | POA: Diagnosis not present

## 2023-04-24 DIAGNOSIS — J811 Chronic pulmonary edema: Secondary | ICD-10-CM | POA: Diagnosis not present

## 2023-04-24 DIAGNOSIS — S066X1A Traumatic subarachnoid hemorrhage with loss of consciousness of 30 minutes or less, initial encounter: Secondary | ICD-10-CM | POA: Diagnosis not present

## 2023-04-24 DIAGNOSIS — M47812 Spondylosis without myelopathy or radiculopathy, cervical region: Secondary | ICD-10-CM | POA: Diagnosis not present

## 2023-04-24 DIAGNOSIS — S0990XA Unspecified injury of head, initial encounter: Secondary | ICD-10-CM | POA: Diagnosis not present

## 2023-04-24 DIAGNOSIS — S066XAA Traumatic subarachnoid hemorrhage with loss of consciousness status unknown, initial encounter: Secondary | ICD-10-CM | POA: Diagnosis not present

## 2023-04-24 DIAGNOSIS — S066X0A Traumatic subarachnoid hemorrhage without loss of consciousness, initial encounter: Secondary | ICD-10-CM | POA: Diagnosis not present

## 2023-04-24 DIAGNOSIS — S40211A Abrasion of right shoulder, initial encounter: Secondary | ICD-10-CM | POA: Diagnosis not present

## 2023-04-24 DIAGNOSIS — Z79899 Other long term (current) drug therapy: Secondary | ICD-10-CM | POA: Diagnosis not present

## 2023-04-24 DIAGNOSIS — Z23 Encounter for immunization: Secondary | ICD-10-CM | POA: Diagnosis not present

## 2023-04-24 DIAGNOSIS — R519 Headache, unspecified: Secondary | ICD-10-CM | POA: Diagnosis not present

## 2023-04-24 DIAGNOSIS — R9431 Abnormal electrocardiogram [ECG] [EKG]: Secondary | ICD-10-CM | POA: Diagnosis not present

## 2023-04-24 DIAGNOSIS — W19XXXA Unspecified fall, initial encounter: Secondary | ICD-10-CM | POA: Diagnosis not present

## 2023-04-24 DIAGNOSIS — S0003XA Contusion of scalp, initial encounter: Secondary | ICD-10-CM | POA: Diagnosis not present

## 2023-04-24 DIAGNOSIS — S299XXA Unspecified injury of thorax, initial encounter: Secondary | ICD-10-CM | POA: Diagnosis not present

## 2023-04-24 DIAGNOSIS — I609 Nontraumatic subarachnoid hemorrhage, unspecified: Secondary | ICD-10-CM | POA: Diagnosis not present

## 2023-04-24 DIAGNOSIS — Z7982 Long term (current) use of aspirin: Secondary | ICD-10-CM | POA: Diagnosis not present

## 2023-04-25 DIAGNOSIS — R402252 Coma scale, best verbal response, oriented, at arrival to emergency department: Secondary | ICD-10-CM | POA: Diagnosis not present

## 2023-04-25 DIAGNOSIS — R402362 Coma scale, best motor response, obeys commands, at arrival to emergency department: Secondary | ICD-10-CM | POA: Diagnosis not present

## 2023-04-25 DIAGNOSIS — Z853 Personal history of malignant neoplasm of breast: Secondary | ICD-10-CM | POA: Diagnosis not present

## 2023-04-25 DIAGNOSIS — T1490XA Injury, unspecified, initial encounter: Secondary | ICD-10-CM | POA: Diagnosis not present

## 2023-04-25 DIAGNOSIS — I609 Nontraumatic subarachnoid hemorrhage, unspecified: Secondary | ICD-10-CM | POA: Diagnosis not present

## 2023-04-25 DIAGNOSIS — Z043 Encounter for examination and observation following other accident: Secondary | ICD-10-CM | POA: Diagnosis not present

## 2023-04-25 DIAGNOSIS — Z85828 Personal history of other malignant neoplasm of skin: Secondary | ICD-10-CM | POA: Diagnosis not present

## 2023-04-25 DIAGNOSIS — M25511 Pain in right shoulder: Secondary | ICD-10-CM | POA: Diagnosis not present

## 2023-04-25 DIAGNOSIS — Z7982 Long term (current) use of aspirin: Secondary | ICD-10-CM | POA: Diagnosis not present

## 2023-04-25 DIAGNOSIS — I959 Hypotension, unspecified: Secondary | ICD-10-CM | POA: Diagnosis not present

## 2023-04-25 DIAGNOSIS — R402142 Coma scale, eyes open, spontaneous, at arrival to emergency department: Secondary | ICD-10-CM | POA: Diagnosis not present

## 2023-04-25 DIAGNOSIS — S066X1A Traumatic subarachnoid hemorrhage with loss of consciousness of 30 minutes or less, initial encounter: Secondary | ICD-10-CM | POA: Diagnosis not present

## 2023-04-25 DIAGNOSIS — I517 Cardiomegaly: Secondary | ICD-10-CM | POA: Diagnosis not present

## 2023-04-27 ENCOUNTER — Ambulatory Visit: Payer: PPO | Attending: Physician Assistant

## 2023-04-27 ENCOUNTER — Other Ambulatory Visit: Payer: Self-pay | Admitting: Physician Assistant

## 2023-04-27 DIAGNOSIS — R002 Palpitations: Secondary | ICD-10-CM

## 2023-04-27 DIAGNOSIS — I471 Supraventricular tachycardia, unspecified: Secondary | ICD-10-CM | POA: Diagnosis not present

## 2023-04-27 DIAGNOSIS — S066X1A Traumatic subarachnoid hemorrhage with loss of consciousness of 30 minutes or less, initial encounter: Secondary | ICD-10-CM | POA: Diagnosis not present

## 2023-04-27 DIAGNOSIS — J90 Pleural effusion, not elsewhere classified: Secondary | ICD-10-CM | POA: Diagnosis not present

## 2023-04-27 DIAGNOSIS — R55 Syncope and collapse: Secondary | ICD-10-CM | POA: Diagnosis not present

## 2023-04-27 NOTE — Progress Notes (Unsigned)
Enrolled for [employer] to mail a ZIO XT long term holter monitor to the patients address on file.  °Dr. Taylor to read. °

## 2023-04-28 ENCOUNTER — Telehealth: Payer: Self-pay | Admitting: Physician Assistant

## 2023-04-28 DIAGNOSIS — Z9889 Other specified postprocedural states: Secondary | ICD-10-CM

## 2023-04-28 DIAGNOSIS — Z5181 Encounter for therapeutic drug level monitoring: Secondary | ICD-10-CM

## 2023-04-28 DIAGNOSIS — I480 Paroxysmal atrial fibrillation: Secondary | ICD-10-CM

## 2023-04-28 MED ORDER — METOPROLOL TARTRATE 25 MG PO TABS
12.5000 mg | ORAL_TABLET | Freq: Two times a day (BID) | ORAL | Status: DC
Start: 2023-04-28 — End: 2023-05-20

## 2023-04-28 NOTE — Telephone Encounter (Signed)
Patient called and said that she wanted to talk with Jari Favre or nurse about re-starting her metoprolol and taking a half instead of a whole. Also mentioned that HR has been running between 80's and 90's

## 2023-04-28 NOTE — Telephone Encounter (Signed)
Spoke with patient and shared Tessa's response: Yes, that is fine. Please have her monitor BP and HR and keep a log for me    Patient verbalized understanding and expressed appreciation for follow-up.  Medication list updated with change to Lopressor dosage.

## 2023-04-28 NOTE — Telephone Encounter (Signed)
Patient states she was seen in hospital 04/24/23 for fall with concussion and a brain bleed. She was having low BP during her time there and was advised not to take metoprolol tartrate 25mg  BID until she spoke with our office.  Patient reports she has not had any metoprolol in 4 days and has noticed her HR has increased from 60's-70's to now 80's-90's. She reports consistent SBP 100-110.  Patient would like to know if it would be OK for her to try taking Lopressor 12.5mg  BID to help lower her HR without lowering her BP too much.  Will forward to Jari Favre, PA-C to review and advise.

## 2023-04-28 NOTE — Telephone Encounter (Signed)
Called patient, see phone note from today.

## 2023-04-30 ENCOUNTER — Other Ambulatory Visit: Payer: Self-pay | Admitting: *Deleted

## 2023-04-30 DIAGNOSIS — R002 Palpitations: Secondary | ICD-10-CM | POA: Diagnosis not present

## 2023-04-30 DIAGNOSIS — I781 Nevus, non-neoplastic: Secondary | ICD-10-CM

## 2023-05-11 DIAGNOSIS — T148XXA Other injury of unspecified body region, initial encounter: Secondary | ICD-10-CM | POA: Diagnosis not present

## 2023-05-11 DIAGNOSIS — S066X1D Traumatic subarachnoid hemorrhage with loss of consciousness of 30 minutes or less, subsequent encounter: Secondary | ICD-10-CM | POA: Diagnosis not present

## 2023-05-18 DIAGNOSIS — S0003XA Contusion of scalp, initial encounter: Secondary | ICD-10-CM | POA: Diagnosis not present

## 2023-05-18 DIAGNOSIS — I609 Nontraumatic subarachnoid hemorrhage, unspecified: Secondary | ICD-10-CM | POA: Diagnosis not present

## 2023-05-20 ENCOUNTER — Ambulatory Visit: Payer: PPO | Admitting: Physician Assistant

## 2023-05-20 ENCOUNTER — Encounter: Payer: Self-pay | Admitting: Physician Assistant

## 2023-05-20 VITALS — BP 100/62 | HR 73 | Ht 67.0 in | Wt 133.0 lb

## 2023-05-20 DIAGNOSIS — R002 Palpitations: Secondary | ICD-10-CM | POA: Diagnosis not present

## 2023-05-20 DIAGNOSIS — K219 Gastro-esophageal reflux disease without esophagitis: Secondary | ICD-10-CM | POA: Diagnosis not present

## 2023-05-20 DIAGNOSIS — R1319 Other dysphagia: Secondary | ICD-10-CM | POA: Diagnosis not present

## 2023-05-20 DIAGNOSIS — I609 Nontraumatic subarachnoid hemorrhage, unspecified: Secondary | ICD-10-CM

## 2023-05-20 MED ORDER — SUCRALFATE 1 GM/10ML PO SUSP: 1.0000 g | Freq: Two times a day (BID) | ORAL | 0 refills | Status: DC

## 2023-05-20 MED ORDER — OMEPRAZOLE 40 MG PO CPDR: 40.0000 mg | DELAYED_RELEASE_CAPSULE | Freq: Two times a day (BID) | ORAL | 0 refills | Status: DC

## 2023-05-20 MED ORDER — METOPROLOL TARTRATE 25 MG PO TABS
25.0000 mg | ORAL_TABLET | Freq: Two times a day (BID) | ORAL | Status: DC
Start: 2023-05-20 — End: 2024-05-03

## 2023-05-20 NOTE — Progress Notes (Addendum)
05/20/2023 Kellie Humphrey [id number] 06/20/1946  Referring provider: Cleatis Polka., MD Primary GI doctor: Dr. Leone Payor  ASSESSMENT AND PLAN:   GERD EGD 2022 unremarkable other than non-H. pylori gastritis Cardiac calcium score March 2024 showed esophageal fluid line possible dysmotility versus GERD 4 months of what she describes as reflux but can happen with water where it feels esophageal discomfort/burning and need to burp but unable to Did not do well Protonix or Nexium, will do trial of Prilosec and Carafate Will get barium swallow to evaluate for dysmotility and reflux Pending symptoms can consider EGD but patient just recovering symptoms subarachnoid hemorrhage some dizziness still and prefer not to have anesthesia at this time which is understandable Close follow-up 2 months   Patient Care Team: Cleatis Polka., MD as PCP - General (Internal Medicine) Meriam Sprague, MD as PCP - Cardiology (Cardiology) Marinus Maw, MD as PCP - Electrophysiology (Cardiology) Bensimhon, Bevelyn Buckles, MD (Cardiology) Magrinat, Valentino Hue, MD (Inactive) as Consulting Physician (Oncology) Freddy Finner, MD (Inactive) as Consulting Physician (Obstetrics and Gynecology) Tyrell Antonio, MD as Consulting Physician (Physical Medicine and Rehabilitation)  HISTORY OF PRESENT ILLNESS: 77 y.o. female with a past medical history of atrial fibrillation, nonrheumatic MR status postrepair, PVCs personal history of breast cancer 2016 and others listed below presents for evaluation of GERD.  2019 colonoscopy 04/30/2021 EGD benign gastric polyps, patchy erythema showing mild reactive gastropathy negative H. pylori no dysplasia 07/31/2021 office visit with Dr. Leone Payor for nausea, weight loss and epigastric discomfort. 04/24/2023 [facility] with resultant bilateral left greater than right subarachnoid hemorrhages, managed conservatively, initiated  Keppra for seizure prevention 8/19/2024repeat CT showed resolution of left frontal lobe subarachnoid hemorrhage small right frontal scalp hematoma and no intracranial no hemorrhages.  Patient reports GERD symptoms for at least 4 months.  Sporadically will feel she will need to burp, will feel air bubble that burns, will wiggle/bend over and feel better, will last for 10 seconds. Will happen several times a day. Can be with water or random, never with food.  No dysphagia, but can feel her throat is closing a little bit and feels harder to breath, this happens separately, worse when it is hot outside and she is walking, every 3-4 days.  She had cardiac calcium score 12/19/2022 that showed esophageal air fluid level dysmotility/GERD.  She has some nausea, not vomiting.  Denies any associated AB pain, dysphagia, melena.. She  denies AB bloating.  No unintentional weight loss, no night sweats. She denies nocturnal symptoms.   Has tried protonix/nexium for one month without relief.   She denies NSAID use.  She denies ETOH use.   She denies family history of gallbladder issues.  She denies blood thinner use.  She denies tobacco use.  She denies drug use.    She  reports that she has never smoked. She has never used smokeless tobacco. She reports current alcohol use. She reports that she does not use drugs.  RELEVANT LABS AND IMAGING: CBC    Component Value Date/Time   WBC 6.6 10/09/2021 0057   RBC 4.48 10/09/2021 0057   HGB 13.3 10/09/2021 0057   HGB 13.8 10/24/2020 1608   HGB 12.7 12/10/2016 1100   HCT 40.3 10/09/2021 0057   HCT 40.8 10/24/2020 1608   HCT 38.1 12/10/2016 1100   PLT 256 10/09/2021 0057   PLT 249 10/24/2020 1608   MCV 90.0 10/09/2021 0057  MCV 91 10/24/2020 1608   MCV 89.4 12/10/2016 1100   MCH 29.7 10/09/2021 0057   MCHC 33.0 10/09/2021 0057   RDW 14.2 10/09/2021 0057   RDW 13.5 10/24/2020 1608   RDW 13.8 12/10/2016 1100   LYMPHSABS 0.9 07/31/2021 1634    LYMPHSABS 1.1 10/24/2020 1608   LYMPHSABS 1.0 12/10/2016 1100   MONOABS 0.6 07/31/2021 1634   MONOABS 0.4 12/10/2016 1100   EOSABS 0.3 07/31/2021 1634   EOSABS 0.2 10/24/2020 1608   BASOSABS 0.0 07/31/2021 1634   BASOSABS 0.0 10/24/2020 1608   BASOSABS 0.0 12/10/2016 1100   No results for input(s): "HGB" in the last 8760 hours.  CMP     Component Value Date/Time   NA 140 09/18/2021 1106   NA 141 10/24/2020 1608   NA 143 12/10/2016 1100   K 3.9 09/18/2021 1106   K 4.1 12/10/2016 1100   CL 105 09/18/2021 1106   CL 108 (H) 02/01/2013 0953   CO2 28 09/18/2021 1106   CO2 28 12/10/2016 1100   GLUCOSE 78 09/18/2021 1106   GLUCOSE 84 12/10/2016 1100   GLUCOSE 100 (H) 02/01/2013 0953   BUN 11 09/18/2021 1106   BUN 10 10/24/2020 1608   BUN 8.6 12/10/2016 1100   CREATININE 0.69 09/18/2021 1106   CREATININE 0.8 12/10/2016 1100   CALCIUM 9.1 09/18/2021 1106   CALCIUM 9.0 12/10/2016 1100   PROT 7.4 09/18/2021 1106   PROT 7.3 10/24/2020 1608   PROT 6.8 12/10/2016 1100   ALBUMIN 4.1 09/18/2021 1106   ALBUMIN 4.5 10/24/2020 1608   ALBUMIN 3.7 12/10/2016 1100   AST 30 09/18/2021 1106   AST 20 12/10/2016 1100   ALT 25 09/18/2021 1106   ALT 10 12/10/2016 1100   ALKPHOS 80 09/18/2021 1106   ALKPHOS 64 12/10/2016 1100   BILITOT 0.7 09/18/2021 1106   BILITOT 0.4 10/24/2020 1608   BILITOT 0.34 12/10/2016 1100   GFRNONAA >60 09/18/2021 1106   GFRAA 89 10/24/2020 1608      Latest Ref Rng & Units 09/18/2021   11:06 AM 07/31/2021    4:34 PM 10/24/2020    4:08 PM  Hepatic Function  Total Protein 6.5 - 8.1 g/dL 7.4  7.3  7.3   Albumin 3.5 - 5.0 g/dL 4.1  4.2  4.5   AST 15 - 41 U/L 30  20  22    ALT 0 - 44 U/L 25  13  14    Alk Phosphatase 38 - 126 U/L 80  92  115   Total Bilirubin 0.3 - 1.2 mg/dL 0.7  0.3  0.4       Current Medications:   Current Outpatient Medications (Endocrine & Metabolic):    levothyroxine (SYNTHROID) 50 MCG tablet, Take 1 tablet (50 mcg total) by mouth daily  before breakfast.  Current Outpatient Medications (Cardiovascular):    flecainide (TAMBOCOR) 50 MG tablet, Take 1.5 tablets (75 mg total) by mouth 2 (two) times daily. With food.   metoprolol tartrate (LOPRESSOR) 25 MG tablet, Take 0.5 tablets (12.5 mg total) by mouth 2 (two) times daily.   Current Outpatient Medications (Analgesics):    acetaminophen (TYLENOL) 500 MG tablet, Take 500-1,000 mg by mouth See admin instructions. Take 500 mg at night, may take a second 500 -1000 mg dose as needed for pain   aspirin EC 81 MG tablet, Take 1 tablet (81 mg total) by mouth daily. Swallow whole.  Current Outpatient Medications (Hematological):    Cyanocobalamin (B-12) 5000 MCG CAPS, Take 5,000 mcg  by mouth once a week. Mondays  Current Outpatient Medications (Other):    betamethasone dipropionate 0.05 % cream, Apply 1 application. topically 4 (four) times daily.   Biotin 5000 MCG CAPS, Take 5,000 mcg by mouth daily.   Cholecalciferol (VITAMIN D) 2000 units tablet, Take 2,000 Units by mouth daily.   gabapentin (NEURONTIN) 600 MG tablet, Take 600 mg by mouth 2 (two) times daily.   melatonin 5 MG TABS, Take 5 mg by mouth at bedtime.   metroNIDAZOLE (METROCREAM) 0.75 % cream, Apply 1 Application topically 2 (two) times daily.   omeprazole (PRILOSEC) 40 MG capsule, Take 1 capsule (40 mg total) by mouth 2 (two) times daily.   primidone (MYSOLINE) 50 MG tablet, Take 50 mg by mouth at bedtime.   sucralfate (CARAFATE) 1 GM/10ML suspension, Take 10 mLs (1 g total) by mouth 2 (two) times daily before lunch and supper.   SYSTANE ULTRA 0.4-0.3 % SOLN, Place 1 drop into the left eye every 4 (four) hours as needed (dry/irritated eyes. (4-6 times daily)).   Medical History:  Past Medical History:  Diagnosis Date   Anxiety disorder    Arthritis    Cataract    Essential tremor    /familial   Heart murmur 2021   corrected with valve surgery   Hypothyroidism    Interstitial cystitis    Malignant neoplasm of  lower-outer quadrant of right breast of female, estrogen receptor positive (HCC) 02/11/2012   Neuropathy    Nonrheumatic mitral valve regurgitation    PAC (premature atrial contraction)    Paresthesias 07/29/2014   PREMATURE ATRIAL CONTRACTIONS 05/08/2009   Qualifier: Diagnosis of  By: Trevor Iha, RN, [name]     Radiculopathy, cervical region    RLS (restless legs syndrome)    S/P minimally-invasive mitral valve repair 02/10/2020   Complex valvuloplasty including artificial Gore-tex neochord placement x8 with edge-to-edge suture plication of anterior commissure and 32 mm [name] 4D ring annuloplasty via right mini thoracotomy approach   Thyroid disease    Varicose veins of right lower extremity with complications 03/25/2016   Allergies:  Allergies  Allergen Reactions   Codeine Nausea And Vomiting   Phenergan [Promethazine] Other (See Comments)    Long QT   Betadine [Povidone Iodine] Other (See Comments)    Irritation - vaginal   Oxycodone Nausea And Vomiting     Surgical History:  She  has a past surgical history that includes Melanoma excision (Right, 1980); Hemangioma excision (Left, 1990); Breast biopsy (2009); Neck surgery (10/2009); Tonsillectomy; Shoulder arthroscopy (Left, 2016); TEE without cardioversion (N/A, 06/30/2018); Breast lumpectomy (2010); TEE without cardioversion (N/A, 12/12/2019); RIGHT/LEFT HEART CATH AND CORONARY ANGIOGRAPHY (N/A, 01/04/2020); Dilation and curettage of uterus; Eye surgery (Left); Cardiac catheterization (2021); Mitral valve repair (Right, 02/10/2020); TEE without cardioversion (N/A, 02/10/2020); spider vein treatment; bilateral cataract repair (july/aug 2021); uterine fibroids removed (2012); and Robotic assisted bilateral salpingo oophorectomy (Bilateral, 10/01/2021). Family History:  Her family history includes Alzheimer's disease in her mother; Coronary artery disease in her mother; Fibromyalgia in her sister; Heart disease in her maternal  grandmother; Hypertension in her mother; Neuropathy in her father; Osteoarthritis in her father; Osteoporosis in her mother; Other in her father; Tremor in her father.  REVIEW OF SYSTEMS  : All other systems reviewed and negative except where noted in the History of Present Illness.  PHYSICAL EXAM: BP 100/62   Pulse 73   Ht 5\' 7"  (1.702 m)   Wt 133 lb (60.3 kg)   BMI 20.83 kg/m  General Appearance: Well nourished, in no apparent distress. Head:   Normocephalic and atraumatic. Eyes:  sclerae anicteric,conjunctive pink  Respiratory: Respiratory effort normal, BS equal bilaterally without rales, rhonchi, wheezing. Cardio: RRR with no MRGs. Peripheral pulses intact.  Abdomen: Soft,  Non-distended ,active bowel sounds. No tenderness . Without guarding and Without rebound. No masses. Rectal: Not evaluated Musculoskeletal: Full ROM, Normal gait. Without edema. Skin:  Dry and intact without significant lesions or rashes Neuro: Alert and  oriented x4;  No focal deficits. Psych:  Cooperative. Normal mood and affect.    Doree Albee, PA-C 3:59 PM

## 2023-05-20 NOTE — Patient Instructions (Signed)
You have been scheduled for a Barium Esophogram at [facility] (1st floor of the hospital) on 05/29/2023 at 10:00am. Please arrive 30 minutes prior to your appointment for registration. Make certain not to have anything to eat or drink 3 hours prior to your test. If you need to reschedule for any reason, please contact radiology at [phone] to do so. __________________________________________________________________ A barium swallow is an examination that concentrates on views of the esophagus. This tends to be a double contrast exam (barium and two liquids which, when combined, create a gas to distend the wall of the oesophagus) or single contrast (non-ionic iodine based). The study is usually tailored to your symptoms so a good history is essential. Attention is paid during the study to the form, structure and configuration of the esophagus, looking for functional disorders (such as aspiration, dysphagia, achalasia, motility and reflux) EXAMINATION You may be asked to change into a gown, depending on the type of swallow being performed. A radiologist and radiographer will perform the procedure. The radiologist will advise you of the type of contrast selected for your procedure and direct you during the exam. You will be asked to stand, sit or lie in several different positions and to hold a small amount of fluid in your mouth before being asked to swallow while the imaging is performed .In some instances you may be asked to swallow barium coated marshmallows to assess the motility of a solid food bolus. The exam can be recorded as a digital or video fluoroscopy procedure. POST PROCEDURE It will take 1-2 days for the barium to pass through your system. To facilitate this, it is important, unless otherwise directed, to increase your fluids for the next 24-48hrs and to resume your normal diet.  This test typically takes about 30 minutes to  perform. __________________________________________________________________________________   Sending a medication called Carafate, this mechanically coats your stomach. Can cause constipation and darker stools. Take about 30 mins to 1 hour before food and before bed.  If the pill is too large to take, can dissolve it in water and a small orange juice glass or shot glass and take it more as a liquid. Take away from your thyroid pill, lunch and supper   Avoid spicy and acidic foods Avoid fatty foods Limit your intake of coffee, tea, alcohol, and carbonated drinks Work to maintain a healthy weight Keep the head of the bed elevated at least 3 inches with blocks or a wedge pillow if you are having any nighttime symptoms Stay upright for 2 hours after eating Avoid meals and snacks three to four hours before bedtime  Behavioral and Dietary Strategies for Management of Esophageal Dysmotility/dysphagia 1. Take reflux medications 30+ minutes before food in the morning.  2. Begin meals with warm beverage 3. Eat smaller more frequent meals 4. Eat slowly, taking small bites and sips 5. Alternate solids and liquids 6. Avoid foods/liquids that increase acid production 7. Sit upright during and for 30+ minutes after meals to facilitate esophageal clearing 8. Can try altoid melting in mouth before food  _______________________________________________________  If your blood pressure at your visit was 140/90 or greater, please contact your primary care physician to follow up on this.  _______________________________________________________  If you are age 48 or older, your body mass index should be between 23-30. Your Body mass index is 20.83 kg/m. If this is out of the aforementioned range listed, please consider follow up with your Primary Care Provider.  If you are age 71 or  younger, your body mass index should be between 19-25. Your Body mass index is 20.83 kg/m. If this is out of the  aformentioned range listed, please consider follow up with your Primary Care Provider.   ________________________________________________________  The [facility] providers would like to encourage you to use [facility] to communicate with providers for non-urgent requests or questions.  Due to long hold times on the telephone, sending your provider a message by [facility] may be a faster and more efficient way to get a response.  Please allow 48 business hours for a response.  Please remember that this is for non-urgent requests.  _______________________________________________________ It was a pleasure to see you today!  Thank you for trusting me with your gastrointestinal care!

## 2023-05-26 ENCOUNTER — Ambulatory Visit (HOSPITAL_COMMUNITY)
Admission: RE | Admit: 2023-05-26 | Discharge: 2023-05-26 | Disposition: A | Discharge status: Home / Self Care | Payer: PPO | Source: Ambulatory Visit | Attending: Vascular Surgery | Admitting: Vascular Surgery

## 2023-05-26 ENCOUNTER — Ambulatory Visit: Payer: PPO | Admitting: Physician Assistant

## 2023-05-26 VITALS — BP 117/66 | HR 60 | Temp 94.6°F | Resp 18 | Ht 67.0 in | Wt 130.0 lb

## 2023-05-26 DIAGNOSIS — I781 Nevus, non-neoplastic: Secondary | ICD-10-CM | POA: Diagnosis not present

## 2023-05-26 DIAGNOSIS — I872 Venous insufficiency (chronic) (peripheral): Secondary | ICD-10-CM

## 2023-05-26 NOTE — Progress Notes (Signed)
Office Note     CC:  follow up Requesting Provider:  Sharlene Dory, PA-C  HPI: Kellie Humphrey is a 77 y.o. (07/10/1946) female who presents for evaluation of spider veins of both anterior ankles.  These are not painful or bothersome to the patient other than the appearance.  She has no history of DVT, venous ulcerations, trauma, or prior vascular intervention to bilateral lower extremities.  She does not wear compression.  She occasionally will elevate her legs to help relieve the pressure in her lower legs during the day.  She denies tobacco use.  Patient states she had sclerotherapy in 2018 to a different area of her ankle however did not have favorable results.   Past Medical History:  Diagnosis Date   Anxiety disorder    Arthritis    Cataract    Essential tremor    /familial   Heart murmur 2021   corrected with valve surgery   Hypothyroidism    Interstitial cystitis    Malignant neoplasm of lower-outer quadrant of right breast of female, estrogen receptor positive (HCC) 02/11/2012   Neuropathy    Nonrheumatic mitral valve regurgitation    PAC (premature atrial contraction)    Paresthesias 07/29/2014   PREMATURE ATRIAL CONTRACTIONS 05/08/2009   Qualifier: Diagnosis of  By: Trevor Iha, RN, [name]     Radiculopathy, cervical region    RLS (restless legs syndrome)    S/P minimally-invasive mitral valve repair 02/10/2020   Complex valvuloplasty including artificial Gore-tex neochord placement x8 with edge-to-edge suture plication of anterior commissure and 32 mm [name] 4D ring annuloplasty via right mini thoracotomy approach   Thyroid disease    Varicose veins of right lower extremity with complications 03/25/2016    Past Surgical History:  Procedure Laterality Date   bilateral cataract repair  july/aug 2021   Dr Darel Hong   BREAST BIOPSY  2009   BREAST LUMPECTOMY  2010   CARDIAC CATHETERIZATION  2021   DILATION AND CURETTAGE OF UTERUS     age 57   EYE SURGERY Left     Pterygium   HEMANGIOMA EXCISION Left 1990   arm   MELANOMA EXCISION Right 1980   thigh   MITRAL VALVE REPAIR Right 02/10/2020   Procedure: MINIMALLY INVASIVE MITRAL VALVE REPAIR (MVR) using Memo 4D 32 MM Mitral Valve Ring.;  Surgeon: Purcell Nails, MD;  Location: [facility] OR;  Service: Open Heart Surgery;  Laterality: Right;   NECK SURGERY  10/2009   C5-6 fusion   RIGHT/LEFT HEART CATH AND CORONARY ANGIOGRAPHY N/A 01/04/2020   Procedure: RIGHT/LEFT HEART CATH AND CORONARY ANGIOGRAPHY;  Surgeon: Kathleene Hazel, MD;  Location: [facility] INVASIVE CV LAB;  Service: Cardiovascular;  Laterality: N/A;   ROBOTIC ASSISTED BILATERAL SALPINGO OOPHERECTOMY Bilateral 10/01/2021   Procedure: XI ROBOTIC ASSISTED BILATERAL SALPINGO OOPHORECTOMY;  Surgeon: Carver Fila, MD;  Location: [facility] ORS;  Service: Gynecology;  Laterality: Bilateral;   SHOULDER ARTHROSCOPY Left 2016   spider vein treatment     TEE WITHOUT CARDIOVERSION N/A 06/30/2018   Procedure: TRANSESOPHAGEAL ECHOCARDIOGRAM (TEE);  Surgeon: Quintella Reichert, MD;  Location: [facility] ENDOSCOPY;  Service: Cardiovascular;  Laterality: N/A;   TEE WITHOUT CARDIOVERSION N/A 12/12/2019   Procedure: TRANSESOPHAGEAL ECHOCARDIOGRAM (TEE);  Surgeon: Lars Masson, MD;  Location: [facility] ENDOSCOPY;  Service: Cardiovascular;  Laterality: N/A;   TEE WITHOUT CARDIOVERSION N/A 02/10/2020   Procedure: TRANSESOPHAGEAL ECHOCARDIOGRAM (TEE);  Surgeon: Purcell Nails, MD;  Location: [facility] OR;  Service: Open Heart Surgery;  Laterality:  N/A;   TONSILLECTOMY     as a child   uterine fibroids removed  2012    Social History   Socioeconomic History   Marital status: Married    Spouse name: Kellie Humphrey   Number of children: 2   Years of education: Bachelor   Highest education level: Not on file  Occupational History   Not on file  Tobacco Use   Smoking status: Never   Smokeless tobacco: Never  Vaping Use   Vaping status: Never Used  Substance and Sexual Activity   Alcohol  use: Yes    Comment: Socially   Drug use: No   Sexual activity: Yes    Birth control/protection: Post-menopausal  Other Topics Concern   Not on file  Social History Narrative   Patient is married to [place], has 2 children   Patient is right handed   Education level is Bachelor   Caffeine consumption is none other than in chocolate occasionally       Social Determinants of Health   Financial Resource Strain: Not on file  Food Insecurity: Low Risk  (04/15/2023)   Received from [facility]   Food vital sign    Within the past 12 months, you worried that your food would run out before you got money to buy more: Never true    Within the past 12 months, the food you bought just didn't last and you didn't have money to get more. : Never true  Transportation Needs: Not on file (04/15/2023)  Physical Activity: Not on file  Stress: Not on file  Social Connections: Not on file  Intimate Partner Violence: Not on file    Family History  Problem Relation Age of Onset   Coronary artery disease Mother        s/p MI (24s)   Hypertension Mother    Osteoporosis Mother        wrist fracture   Alzheimer's disease Mother    Osteoarthritis Father    Neuropathy Father    Tremor Father    Other Father        lymphedema   Fibromyalgia Sister    Heart disease Maternal Grandmother    Colon polyps Neg Hx    Esophageal cancer Neg Hx    Pancreatic cancer Neg Hx    Stomach cancer Neg Hx    Rectal cancer Neg Hx    Ovarian cancer Neg Hx    Breast cancer Neg Hx    Colon cancer Neg Hx    Prostate cancer Neg Hx    Endometrial cancer Neg Hx     Current Outpatient Medications  Medication Sig Dispense Refill   acetaminophen (TYLENOL) 500 MG tablet Take 500-1,000 mg by mouth See admin instructions. Take 500 mg at night, may take a second 500 -1000 mg dose as needed for pain     aspirin EC 81 MG tablet Take 1 tablet (81 mg total) by mouth daily. Swallow whole. 90 tablet 3   betamethasone  dipropionate 0.05 % cream Apply 1 application. topically 4 (four) times daily.     Biotin 5000 MCG CAPS Take 5,000 mcg by mouth daily.     Cholecalciferol (VITAMIN D) 2000 units tablet Take 2,000 Units by mouth daily.     Cyanocobalamin (B-12) 5000 MCG CAPS Take 5,000 mcg by mouth once a week. Mondays     flecainide (TAMBOCOR) 50 MG tablet Take 1.5 tablets (75 mg total) by mouth 2 (two) times daily. With food. 270 tablet 3  gabapentin (NEURONTIN) 600 MG tablet Take 600 mg by mouth 2 (two) times daily.     levothyroxine (SYNTHROID) 50 MCG tablet Take 1 tablet (50 mcg total) by mouth daily before breakfast.     melatonin 5 MG TABS Take 5 mg by mouth at bedtime.     metoprolol tartrate (LOPRESSOR) 25 MG tablet Take 1 tablet (25 mg total) by mouth 2 (two) times daily.     metroNIDAZOLE (METROCREAM) 0.75 % cream Apply 1 Application topically 2 (two) times daily.     omeprazole (PRILOSEC) 40 MG capsule Take 1 capsule (40 mg total) by mouth 2 (two) times daily. 60 capsule 0   primidone (MYSOLINE) 50 MG tablet Take 50 mg by mouth at bedtime.     SYSTANE ULTRA 0.4-0.3 % SOLN Place 1 drop into the left eye every 4 (four) hours as needed (dry/irritated eyes. (4-6 times daily)).      sucralfate (CARAFATE) 1 GM/10ML suspension Take 10 mLs (1 g total) by mouth 2 (two) times daily before lunch and supper. 473 mL 0   No current facility-administered medications for this visit.    Allergies  Allergen Reactions   Codeine Nausea And Vomiting   Phenergan [Promethazine] Other (See Comments)    Long QT   Betadine [Povidone Iodine] Other (See Comments)    Irritation - vaginal   Oxycodone Nausea And Vomiting     REVIEW OF SYSTEMS:   [X]  denotes positive finding, [ ]  denotes negative finding Cardiac  Comments:  Chest pain or chest pressure:    Shortness of breath upon exertion:    Short of breath when lying flat:    Irregular heart rhythm:        Vascular    Pain in calf, thigh, or hip brought on by  ambulation:    Pain in feet at night that wakes you up from your sleep:     Blood clot in your veins:    Leg swelling:         Pulmonary    Oxygen at home:    Productive cough:     Wheezing:         Neurologic    Sudden weakness in arms or legs:     Sudden numbness in arms or legs:     Sudden onset of difficulty speaking or slurred speech:    Temporary loss of vision in one eye:     Problems with dizziness:         Gastrointestinal    Blood in stool:     Vomited blood:         Genitourinary    Burning when urinating:     Blood in urine:        Psychiatric    Major depression:         Hematologic    Bleeding problems:    Problems with blood clotting too easily:        Skin    Rashes or ulcers:        Constitutional    Fever or chills:      PHYSICAL EXAMINATION:  Vitals:   05/26/23 1244  BP: 117/66  Pulse: 60  Resp: 18  Temp: (!) 94.6 F (34.8 C)  TempSrc: Temporal  SpO2: 98%  Weight: 130 lb (59 kg)  Height: 5\' 7"  (1.702 m)    General:  WDWN in NAD; vital signs documented above Gait: Not observed HENT: WNL, normocephalic Pulmonary: normal non-labored breathing , without Rales, rhonchi,  wheezing Cardiac: regular HR Abdomen: soft, NT, no masses Skin: without rashes Vascular Exam/Pulses: palpable DP pulses Extremities: without ischemic changes, without Gangrene , without cellulitis; without open wounds; spider veins anterior ankle BLE Musculoskeletal: no muscle wasting or atrophy  Neurologic: A&O X 3 Psychiatric:  The pt has Normal affect.   Non-Invasive Vascular Imaging:   Left lower extremity venous reflux study negative for DVT Incompetent common femoral vein Incompetent GSV in the mid thigh and distal thigh only    ASSESSMENT/PLAN:: 77 y.o. female here for evaluation of asymptomatic spider veins at the level of her ankle of bilateral lower extremities   -Ms. Coty Bauknecht is a 77 year old female with spider veins at the level of her ankles  of bilateral lower extremities.  These do not cause her discomfort or pain however she is concerned about the appearance.  Left lower extremity venous reflux study was negative for DVT.  She does have mild superficial and deep venous reflux however she would not be a candidate for saphenous vein ablation.  I recommended sclerotherapy to help treat these areas.  Patient states she has had sclerotherapy in the past however did not have great results.  She will notify our office if she changes her mind and would try sclerotherapy again.  I also recommended knee-high 15 to 20 mmHg compression stockings to be worn on a regular basis.  She can also periodically elevate her legs above the level of her heart during the day 10 to 15 minutes at a time.  Recommendations also included avoiding prolonged sitting and standing if possible.  For now she will follow-up on an as-needed basis.   Emilie Rutter, PA-C [facility] [phone]  Clinic MD:   Lenell Antu

## 2023-06-10 ENCOUNTER — Ambulatory Visit (HOSPITAL_COMMUNITY)
Admission: RE | Admit: 2023-06-10 | Discharge: 2023-06-10 | Disposition: A | Discharge status: Home / Self Care | Payer: PPO | Source: Ambulatory Visit | Attending: Physician Assistant | Admitting: Physician Assistant

## 2023-06-10 DIAGNOSIS — Z981 Arthrodesis status: Secondary | ICD-10-CM | POA: Diagnosis not present

## 2023-06-10 DIAGNOSIS — R1319 Other dysphagia: Secondary | ICD-10-CM | POA: Insufficient documentation

## 2023-06-10 DIAGNOSIS — K219 Gastro-esophageal reflux disease without esophagitis: Secondary | ICD-10-CM | POA: Insufficient documentation

## 2023-06-11 ENCOUNTER — Other Ambulatory Visit: Payer: Self-pay | Admitting: Physician Assistant

## 2023-06-17 MED ORDER — SUCRALFATE 1 G PO TABS: 1.0000 g | ORAL_TABLET | Freq: Three times a day (TID) | ORAL | 0 refills | Status: DC

## 2023-06-25 DIAGNOSIS — K219 Gastro-esophageal reflux disease without esophagitis: Secondary | ICD-10-CM | POA: Diagnosis not present

## 2023-06-25 DIAGNOSIS — E039 Hypothyroidism, unspecified: Secondary | ICD-10-CM | POA: Diagnosis not present

## 2023-06-25 DIAGNOSIS — Z79899 Other long term (current) drug therapy: Secondary | ICD-10-CM | POA: Diagnosis not present

## 2023-06-25 DIAGNOSIS — Z23 Encounter for immunization: Secondary | ICD-10-CM | POA: Diagnosis not present

## 2023-07-09 ENCOUNTER — Other Ambulatory Visit: Payer: Self-pay | Admitting: Physician Assistant

## 2023-07-14 DIAGNOSIS — H524 Presbyopia: Secondary | ICD-10-CM | POA: Diagnosis not present

## 2023-07-14 DIAGNOSIS — H04123 Dry eye syndrome of bilateral lacrimal glands: Secondary | ICD-10-CM | POA: Diagnosis not present

## 2023-07-30 DIAGNOSIS — E039 Hypothyroidism, unspecified: Secondary | ICD-10-CM | POA: Diagnosis not present

## 2023-07-30 DIAGNOSIS — Z23 Encounter for immunization: Secondary | ICD-10-CM | POA: Diagnosis not present

## 2023-08-03 NOTE — Progress Notes (Unsigned)
08/03/2023 Kellie Humphrey [id number] May 18, 1946  Referring provider: Cleatis Polka., MD Primary GI doctor: Dr. Leone Payor  ASSESSMENT AND PLAN:   GERD EGD 2022 unremarkable other than non-H. pylori gastritis Cardiac calcium score March 2024 showed esophageal fluid line possible dysmotility versus GERD Barium swallow showed dysmotility and GERD, no strictures.  Has had improvement with her symptoms, no further dysphagia on prilosec 40 mg once daily but is taking it after dinner (due to thyroid medication in AM) - continue GERD lifestyle changes - change prilosec 40 mg before dinner - pepcid as needed, stop carafate - if symptoms worsen/continue, will proceed with EGD    Patient Care Team: Cleatis Polka., MD as PCP - General (Internal Medicine) Meriam Sprague, MD (Inactive) as PCP - Cardiology (Cardiology) Marinus Maw, MD as PCP - Electrophysiology (Cardiology) Bensimhon, Bevelyn Buckles, MD (Cardiology) Magrinat, Valentino Hue, MD (Inactive) as Consulting Physician (Oncology) Freddy Finner, MD (Inactive) as Consulting Physician (Obstetrics and Gynecology) Tyrell Antonio, MD as Consulting Physician (Physical Medicine and Rehabilitation)  HISTORY OF PRESENT ILLNESS: 77 y.o. female with a past medical history of atrial fibrillation, nonrheumatic MR status postrepair, PVCs personal history of breast cancer 2016 and others listed below presents for evaluation of GERD.  2019 colonoscopy 04/30/2021 EGD benign gastric polyps, patchy erythema showing mild reactive gastropathy negative H. pylori no dysplasia 07/31/2021 office visit with Dr. Leone Payor for nausea, weight loss and epigastric discomfort. 04/24/2023 [facility] with resultant bilateral left greater than right subarachnoid hemorrhages, managed conservatively, initiated Keppra for seizure prevention 05/18/2023 repeat CT showed resolution of left frontal lobe subarachnoid  hemorrhage small right frontal scalp hematoma and no intracranial no hemorrhages. 05/20/23 office with myself for reflux cardiac calcium score showed possible esophageal fluid line dysmotility versus GERD did trial Prilosec and Carafate.  Patient still healing from subarachnoid hemorrhage.    barium swallow 06/10/2023 which showed multiple tertiary contractions and poor primary stripping wave continue to mild-moderate soft dysmotility, mild GERD and no hiatal hernia  patient patient presents today to be evaluated, if symptoms persist will schedule for EGD.  Feels the symptoms she has is not happening as often and the dysphagia has resolved.   She has less reflux/GERD into her chest/throat.  She continues to have nausea/queasy, better after she eats.  Better if she avoids spicy foods.  She is still on the carafate 3 x a day, lunch, dinner and bed time. She is on the omeprazole once a day 40 mg a day.  When she took 40 mg twice a day, she had increasing gas and loose stools.  No weight loss, no melena, no hematochezia.   She denies NSAID use.  She denies ETOH use.   She denies family history of gallbladder issues.  She denies blood thinner use.  She denies tobacco use.  She denies drug use.    She  reports that she has never smoked. She has never used smokeless tobacco. She reports current alcohol use. She reports that she does not use drugs.  RELEVANT LABS AND IMAGING: CBC    Component Value Date/Time   WBC 6.6 10/09/2021 0057   RBC 4.48 10/09/2021 0057   HGB 13.3 10/09/2021 0057   HGB 13.8 10/24/2020 1608   HGB 12.7 12/10/2016 1100   HCT 40.3 10/09/2021 0057   HCT 40.8 10/24/2020 1608   HCT 38.1 12/10/2016 1100   PLT 256 10/09/2021 0057   PLT 249 10/24/2020  1608   MCV 90.0 10/09/2021 0057   MCV 91 10/24/2020 1608   MCV 89.4 12/10/2016 1100   MCH 29.7 10/09/2021 0057   MCHC 33.0 10/09/2021 0057   RDW 14.2 10/09/2021 0057   RDW 13.5 10/24/2020 1608   RDW 13.8 12/10/2016 1100    LYMPHSABS 0.9 07/31/2021 1634   LYMPHSABS 1.1 10/24/2020 1608   LYMPHSABS 1.0 12/10/2016 1100   MONOABS 0.6 07/31/2021 1634   MONOABS 0.4 12/10/2016 1100   EOSABS 0.3 07/31/2021 1634   EOSABS 0.2 10/24/2020 1608   BASOSABS 0.0 07/31/2021 1634   BASOSABS 0.0 10/24/2020 1608   BASOSABS 0.0 12/10/2016 1100   No results for input(s): "HGB" in the last 8760 hours.  CMP     Component Value Date/Time   NA 140 09/18/2021 1106   NA 141 10/24/2020 1608   NA 143 12/10/2016 1100   K 3.9 09/18/2021 1106   K 4.1 12/10/2016 1100   CL 105 09/18/2021 1106   CL 108 (H) 02/01/2013 0953   CO2 28 09/18/2021 1106   CO2 28 12/10/2016 1100   GLUCOSE 78 09/18/2021 1106   GLUCOSE 84 12/10/2016 1100   GLUCOSE 100 (H) 02/01/2013 0953   BUN 11 09/18/2021 1106   BUN 10 10/24/2020 1608   BUN 8.6 12/10/2016 1100   CREATININE 0.69 09/18/2021 1106   CREATININE 0.8 12/10/2016 1100   CALCIUM 9.1 09/18/2021 1106   CALCIUM 9.0 12/10/2016 1100   PROT 7.4 09/18/2021 1106   PROT 7.3 10/24/2020 1608   PROT 6.8 12/10/2016 1100   ALBUMIN 4.1 09/18/2021 1106   ALBUMIN 4.5 10/24/2020 1608   ALBUMIN 3.7 12/10/2016 1100   AST 30 09/18/2021 1106   AST 20 12/10/2016 1100   ALT 25 09/18/2021 1106   ALT 10 12/10/2016 1100   ALKPHOS 80 09/18/2021 1106   ALKPHOS 64 12/10/2016 1100   BILITOT 0.7 09/18/2021 1106   BILITOT 0.4 10/24/2020 1608   BILITOT 0.34 12/10/2016 1100   GFRNONAA >60 09/18/2021 1106   GFRAA 89 10/24/2020 1608      Latest Ref Rng & Units 09/18/2021   11:06 AM 07/31/2021    4:34 PM 10/24/2020    4:08 PM  Hepatic Function  Total Protein 6.5 - 8.1 g/dL 7.4  7.3  7.3   Albumin 3.5 - 5.0 g/dL 4.1  4.2  4.5   AST 15 - 41 U/L 30  20  22    ALT 0 - 44 U/L 25  13  14    Alk Phosphatase 38 - 126 U/L 80  92  115   Total Bilirubin 0.3 - 1.2 mg/dL 0.7  0.3  0.4       Current Medications:   Current Outpatient Medications (Endocrine & Metabolic):    levothyroxine (SYNTHROID) 50 MCG tablet, Take 1  tablet (50 mcg total) by mouth daily before breakfast.  Current Outpatient Medications (Cardiovascular):    flecainide (TAMBOCOR) 50 MG tablet, Take 1.5 tablets (75 mg total) by mouth 2 (two) times daily. With food.   metoprolol tartrate (LOPRESSOR) 25 MG tablet, Take 1 tablet (25 mg total) by mouth 2 (two) times daily.   Current Outpatient Medications (Analgesics):    acetaminophen (TYLENOL) 500 MG tablet, Take 500-1,000 mg by mouth See admin instructions. Take 500 mg at night, may take a second 500 -1000 mg dose as needed for pain   aspirin EC 81 MG tablet, Take 1 tablet (81 mg total) by mouth daily. Swallow whole.  Current Outpatient Medications (Hematological):  Cyanocobalamin (B-12) 5000 MCG CAPS, Take 5,000 mcg by mouth once a week. Mondays  Current Outpatient Medications (Other):    betamethasone dipropionate 0.05 % cream, Apply 1 application. topically 4 (four) times daily.   Biotin 5000 MCG CAPS, Take 5,000 mcg by mouth daily.   Cholecalciferol (VITAMIN D) 2000 units tablet, Take 2,000 Units by mouth daily.   gabapentin (NEURONTIN) 600 MG tablet, Take 600 mg by mouth 2 (two) times daily.   melatonin 5 MG TABS, Take 5 mg by mouth at bedtime.   metroNIDAZOLE (METROCREAM) 0.75 % cream, Apply 1 Application topically 2 (two) times daily.   omeprazole (PRILOSEC) 40 MG capsule, TAKE 1 CAPSULE BY MOUTH TWICE A DAY   primidone (MYSOLINE) 50 MG tablet, Take 50 mg by mouth at bedtime.   sucralfate (CARAFATE) 1 g tablet, TAKE 1 TABLET (1 G TOTAL) BY MOUTH 4 TIMES A DAY WITH MEALS AND AT BEDTIME   SYSTANE ULTRA 0.4-0.3 % SOLN, Place 1 drop into the left eye every 4 (four) hours as needed (dry/irritated eyes. (4-6 times daily)).   Medical History:  Past Medical History:  Diagnosis Date   Anxiety disorder    Arthritis    Cataract    Essential tremor    /familial   Heart murmur 2021   corrected with valve surgery   Hypothyroidism    Interstitial cystitis    Malignant neoplasm of  lower-outer quadrant of right breast of female, estrogen receptor positive (HCC) 02/11/2012   Neuropathy    Nonrheumatic mitral valve regurgitation    PAC (premature atrial contraction)    Paresthesias 07/29/2014   PREMATURE ATRIAL CONTRACTIONS 05/08/2009   Qualifier: Diagnosis of  By: Trevor Iha, RN, [name]     Radiculopathy, cervical region    RLS (restless legs syndrome)    S/P minimally-invasive mitral valve repair 02/10/2020   Complex valvuloplasty including artificial Gore-tex neochord placement x8 with edge-to-edge suture plication of anterior commissure and 32 mm [name] 4D ring annuloplasty via right mini thoracotomy approach   Thyroid disease    Varicose veins of right lower extremity with complications 03/25/2016   Allergies:  Allergies  Allergen Reactions   Codeine Nausea And Vomiting   Phenergan [Promethazine] Other (See Comments)    Long QT   Betadine [Povidone Iodine] Other (See Comments)    Irritation - vaginal   Oxycodone Nausea And Vomiting     Surgical History:  She  has a past surgical history that includes Melanoma excision (Right, 1980); Hemangioma excision (Left, 1990); Breast biopsy (2009); Neck surgery (10/2009); Tonsillectomy; Shoulder arthroscopy (Left, 2016); TEE without cardioversion (N/A, 06/30/2018); Breast lumpectomy (2010); TEE without cardioversion (N/A, 12/12/2019); RIGHT/LEFT HEART CATH AND CORONARY ANGIOGRAPHY (N/A, 01/04/2020); Dilation and curettage of uterus; Eye surgery (Left); Cardiac catheterization (2021); Mitral valve repair (Right, 02/10/2020); TEE without cardioversion (N/A, 02/10/2020); spider vein treatment; bilateral cataract repair (july/aug 2021); uterine fibroids removed (2012); and Robotic assisted bilateral salpingo oophorectomy (Bilateral, 10/01/2021). Family History:  Her family history includes Alzheimer's disease in her mother; Coronary artery disease in her mother; Fibromyalgia in her sister; Heart disease in her maternal  grandmother; Hypertension in her mother; Neuropathy in her father; Osteoarthritis in her father; Osteoporosis in her mother; Other in her father; Tremor in her father.  REVIEW OF SYSTEMS  : All other systems reviewed and negative except where noted in the History of Present Illness.  PHYSICAL EXAM: There were no vitals taken for this visit. General Appearance: Well nourished, in no apparent distress. Head:   Normocephalic  and atraumatic. Eyes:  sclerae anicteric,conjunctive pink  Respiratory: Respiratory effort normal, BS equal bilaterally without rales, rhonchi, wheezing. Cardio: RRR with no MRGs. Peripheral pulses intact.  Abdomen: Soft,  Non-distended ,active bowel sounds. No tenderness . Without guarding and Without rebound. No masses. Rectal: Not evaluated Musculoskeletal: Full ROM, Normal gait. Without edema. Skin:  Dry and intact without significant lesions or rashes Neuro: Alert and  oriented x4;  No focal deficits. Psych:  Cooperative. Normal mood and affect.    Doree Albee, PA-C 11:31 AM

## 2023-08-04 ENCOUNTER — Ambulatory Visit: Payer: PPO | Admitting: Physician Assistant

## 2023-08-04 ENCOUNTER — Encounter: Payer: Self-pay | Admitting: Physician Assistant

## 2023-08-04 VITALS — BP 106/72 | HR 64 | Ht 67.0 in | Wt 132.0 lb

## 2023-08-04 DIAGNOSIS — K219 Gastro-esophageal reflux disease without esophagitis: Secondary | ICD-10-CM | POA: Diagnosis not present

## 2023-08-04 DIAGNOSIS — I609 Nontraumatic subarachnoid hemorrhage, unspecified: Secondary | ICD-10-CM

## 2023-08-04 DIAGNOSIS — R1319 Other dysphagia: Secondary | ICD-10-CM

## 2023-08-04 NOTE — Patient Instructions (Addendum)
Please take your proton pump inhibitor medication, prilosec 40 mg take 30 mins to at hour before dinner.   Can add on pepcid/famotidine as needed from over the counter Stop the carafate for now or just right before bed  Avoid spicy and acidic foods Avoid fatty foods Limit your intake of coffee, tea, alcohol, and carbonated drinks Work to maintain a healthy weight Keep the head of the bed elevated at least 3 inches with blocks or a wedge pillow if you are having any nighttime symptoms Stay upright for 2 hours after eating Avoid meals and snacks three to four hours before bedtime  If symptoms return or worsen, will plan on proceeding with the upper endoscopy.

## 2023-08-18 ENCOUNTER — Telehealth: Payer: Self-pay | Admitting: Physician Assistant

## 2023-08-18 ENCOUNTER — Other Ambulatory Visit: Payer: Self-pay

## 2023-08-18 MED ORDER — AMOXICILLIN 500 MG PO TABS: 2000.0000 mg | ORAL_TABLET | Freq: Once | ORAL | 0 refills | Status: AC

## 2023-08-18 NOTE — Telephone Encounter (Signed)
*  STAT* If patient is at the pharmacy, call can be transferred to refill team.   1. Which medications need to be refilled? (please list name of each medication and dose if known)illin need a new prescription for Amoxicillin  500 mg   2. Would you like to learn more about the convenience, safety, & potential cost savings by using the [facility]?    3. Are you open to using the [facility] (Type [facility]..   4. Which pharmacy/location (including street and city if local pharmacy) is medication to be sent to?[facility] [place], [place]   5. Do they need a 30 day or 90 day supply? #8

## 2023-08-30 ENCOUNTER — Encounter: Payer: Self-pay | Admitting: Internal Medicine

## 2023-09-02 ENCOUNTER — Other Ambulatory Visit: Payer: Self-pay | Admitting: Obstetrics and Gynecology

## 2023-09-02 DIAGNOSIS — Z0000 Encounter for general adult medical examination without abnormal findings: Secondary | ICD-10-CM

## 2023-09-02 NOTE — Telephone Encounter (Signed)
Reached out to Cassie to make appointment. Pt had questions about medication increase per Dr Ladona Ridgel. Called pt spoke with pt about only taking 1 tablet in addition to her normal routine. Told pt if she had any questions before her appointment to call.

## 2023-09-24 ENCOUNTER — Ambulatory Visit: Payer: PPO | Attending: Internal Medicine | Admitting: Internal Medicine

## 2023-09-24 ENCOUNTER — Encounter: Payer: Self-pay | Admitting: Internal Medicine

## 2023-09-24 DIAGNOSIS — I491 Atrial premature depolarization: Secondary | ICD-10-CM | POA: Diagnosis not present

## 2023-09-24 DIAGNOSIS — I493 Ventricular premature depolarization: Secondary | ICD-10-CM | POA: Diagnosis not present

## 2023-09-24 DIAGNOSIS — R002 Palpitations: Secondary | ICD-10-CM | POA: Diagnosis not present

## 2023-09-24 DIAGNOSIS — I480 Paroxysmal atrial fibrillation: Secondary | ICD-10-CM

## 2023-09-24 NOTE — Progress Notes (Signed)
HPI Kellie Humphrey returns for followup. She is a pleasant 77 yo woman with MV repair s/p MV surgery. She was initially on amiodarone but then switched back to flecainide. She has continued to be symptomatic and  she had occaisional PAC's and PVC's and very brief NS SVT. She thinks that her palpitations are worse, occurring every day. When I last saw her we uptitrated her metoprolol and she is a little better. No syncope.  Allergies  Allergen Reactions   Codeine Nausea And Vomiting   Phenergan [Promethazine] Other (See Comments)    Long QT   Betadine [Povidone Iodine] Other (See Comments)    Irritation - vaginal   Oxycodone Nausea And Vomiting     Current Outpatient Medications  Medication Sig Dispense Refill   acetaminophen (TYLENOL) 500 MG tablet Take 500-1,000 mg by mouth See admin instructions. Take 500 mg at night, may take a second 500 -1000 mg dose as needed for pain     aspirin EC 81 MG tablet Take 1 tablet (81 mg total) by mouth daily. Swallow whole. 90 tablet 3   betamethasone dipropionate 0.05 % cream Apply 1 application  topically as needed.     Biotin 5000 MCG CAPS Take 5,000 mcg by mouth daily.     Cholecalciferol (VITAMIN D) 2000 units tablet Take 2,000 Units by mouth daily.     Cyanocobalamin (B-12) 5000 MCG CAPS Take 5,000 mcg by mouth once a week. Mondays     flecainide (TAMBOCOR) 50 MG tablet Take 1.5 tablets (75 mg total) by mouth 2 (two) times daily. With food. 270 tablet 3   gabapentin (NEURONTIN) 600 MG tablet Take 600 mg by mouth 2 (two) times daily.     levothyroxine (SYNTHROID) 50 MCG tablet Take 1 tablet (50 mcg total) by mouth daily before breakfast.     melatonin 5 MG TABS Take 5 mg by mouth at bedtime.     metoprolol tartrate (LOPRESSOR) 25 MG tablet Take 1 tablet (25 mg total) by mouth 2 (two) times daily.     metroNIDAZOLE (METROCREAM) 0.75 % cream Apply 1 Application topically as needed.     omeprazole (PRILOSEC) 40 MG capsule TAKE 1 CAPSULE BY MOUTH  TWICE A DAY 180 capsule 1   primidone (MYSOLINE) 50 MG tablet Take 50 mg by mouth at bedtime.     RESTASIS 0.05 % ophthalmic emulsion Place 1 drop into the left eye 2 (two) times daily.     sucralfate (CARAFATE) 1 g tablet TAKE 1 TABLET (1 G TOTAL) BY MOUTH 4 TIMES A DAY WITH MEALS AND AT BEDTIME 360 tablet 1   SYSTANE ULTRA 0.4-0.3 % SOLN Place 1 drop into the left eye every 4 (four) hours as needed (dry/irritated eyes. (4-6 times daily)).      No current facility-administered medications for this visit.     Past Medical History:  Diagnosis Date   Anxiety disorder    Arthritis    Cataract    Essential tremor    /familial   Heart murmur 2021   corrected with valve surgery   Hypothyroidism    Interstitial cystitis    Malignant neoplasm of lower-outer quadrant of right breast of female, estrogen receptor positive (HCC) 02/11/2012   Neuropathy    Nonrheumatic mitral valve regurgitation    PAC (premature atrial contraction)    Paresthesias 07/29/2014   PREMATURE ATRIAL CONTRACTIONS 05/08/2009   Qualifier: Diagnosis of  By: Trevor Iha, RN, [name]     Radiculopathy, cervical region  RLS (restless legs syndrome)    S/P minimally-invasive mitral valve repair 02/10/2020   Complex valvuloplasty including artificial Gore-tex neochord placement x8 with edge-to-edge suture plication of anterior commissure and 32 mm [name] 4D ring annuloplasty via right mini thoracotomy approach   Thyroid disease    Varicose veins of right lower extremity with complications 03/25/2016    ROS:   All systems reviewed and negative except as noted in the HPI.   Past Surgical History:  Procedure Laterality Date   bilateral cataract repair  july/aug 2021   Dr Darel Hong   BREAST BIOPSY  2009   BREAST LUMPECTOMY  2010   CARDIAC CATHETERIZATION  2021   DILATION AND CURETTAGE OF UTERUS     age 31   EYE SURGERY Left    Pterygium   HEMANGIOMA EXCISION Left 1990   arm   MELANOMA EXCISION Right 1980   thigh    MITRAL VALVE REPAIR Right 02/10/2020   Procedure: MINIMALLY INVASIVE MITRAL VALVE REPAIR (MVR) using Memo 4D 32 MM Mitral Valve Ring.;  Surgeon: Purcell Nails, MD;  Location: [facility] OR;  Service: Open Heart Surgery;  Laterality: Right;   NECK SURGERY  10/2009   C5-6 fusion   RIGHT/LEFT HEART CATH AND CORONARY ANGIOGRAPHY N/A 01/04/2020   Procedure: RIGHT/LEFT HEART CATH AND CORONARY ANGIOGRAPHY;  Surgeon: Kathleene Hazel, MD;  Location: [facility] INVASIVE CV LAB;  Service: Cardiovascular;  Laterality: N/A;   ROBOTIC ASSISTED BILATERAL SALPINGO OOPHERECTOMY Bilateral 10/01/2021   Procedure: XI ROBOTIC ASSISTED BILATERAL SALPINGO OOPHORECTOMY;  Surgeon: Carver Fila, MD;  Location: [facility] ORS;  Service: Gynecology;  Laterality: Bilateral;   SHOULDER ARTHROSCOPY Left 2016   spider vein treatment     TEE WITHOUT CARDIOVERSION N/A 06/30/2018   Procedure: TRANSESOPHAGEAL ECHOCARDIOGRAM (TEE);  Surgeon: Quintella Reichert, MD;  Location: [facility] ENDOSCOPY;  Service: Cardiovascular;  Laterality: N/A;   TEE WITHOUT CARDIOVERSION N/A 12/12/2019   Procedure: TRANSESOPHAGEAL ECHOCARDIOGRAM (TEE);  Surgeon: Lars Masson, MD;  Location: [facility];  Service: Cardiovascular;  Laterality: N/A;   TEE WITHOUT CARDIOVERSION N/A 02/10/2020   Procedure: TRANSESOPHAGEAL ECHOCARDIOGRAM (TEE);  Surgeon: Purcell Nails, MD;  Location: [facility] OR;  Service: Open Heart Surgery;  Laterality: N/A;   TONSILLECTOMY     as a child   uterine fibroids removed  2012     Family History  Problem Relation Age of Onset   Coronary artery disease Mother        s/p MI (11s)   Hypertension Mother    Osteoporosis Mother        wrist fracture   Alzheimer's disease Mother    Osteoarthritis Father    Neuropathy Father    Tremor Father    Other Father        lymphedema   Fibromyalgia Sister    Heart disease Maternal Grandmother    Colon polyps Neg Hx    Esophageal cancer Neg Hx    Pancreatic cancer Neg Hx    Stomach cancer  Neg Hx    Rectal cancer Neg Hx    Ovarian cancer Neg Hx    Breast cancer Neg Hx    Colon cancer Neg Hx    Prostate cancer Neg Hx    Endometrial cancer Neg Hx      Social History   Socioeconomic History   Marital status: Married    Spouse name: Onalee Hua   Number of children: 2   Years of education: Bachelor   Highest education level: Not on  file  Occupational History   Not on file  Tobacco Use   Smoking status: Never   Smokeless tobacco: Never  Vaping Use   Vaping status: Never Used  Substance and Sexual Activity   Alcohol use: Yes    Comment: Socially   Drug use: No   Sexual activity: Yes    Birth control/protection: Post-menopausal  Other Topics Concern   Not on file  Social History Narrative   Patient is married to Onalee Hua, has 2 children   Patient is right handed   Education level is Bachelor   Caffeine consumption is none other than in chocolate occasionally       Social Drivers of Corporate investment banker Strain: Not on file  Food Insecurity: Low Risk  (04/15/2023)   Received from [facility]   Hunger Vital Sign    Worried About Running Out of Food in the Last Year: Never true    Ran Out of Food in the Last Year: Never true  Transportation Needs: Not on file (04/15/2023)  Physical Activity: Not on file  Stress: Not on file  Social Connections: Not on file  Intimate Partner Violence: Not on file     BP 108/74 (BP Location: Left Arm, Patient Position: Sitting, Cuff Size: Normal)   Pulse 67   Ht 5\' 7"  (1.702 m)   Wt 133 lb 9.6 oz (60.6 kg)   SpO2 99%   BMI 20.92 kg/m   Physical Exam:  Well appearing NAD HEENT: Unremarkable Neck:  No JVD, no thyromegally Lymphatics:  No adenopathy Back:  No CVA tenderness Lungs:  Clear HEART:  Regular rate rhythm, no murmurs, no rubs, no clicks Abd:  soft, positive bowel sounds, no organomegally, no rebound, no guarding Ext:  2 plus pulses, no edema, no cyanosis, no clubbing Skin:  No rashes no nodules Neuro:   CN II through XII intact, motor grossly intact  EKG - nsr    Assess/Plan:  Palpitations - her symptoms are improved. We discussed the treatment options and I recommended flecainide 100 daily and 75 mg in the evening  and continue her metoprolol. She will continue as she is. I encouraged her to take an additional dose of beta blocker as needed.   Sharlot Gowda Minnie Shi,MD

## 2023-09-24 NOTE — Patient Instructions (Signed)
Medication Instructions:  Your physician has recommended you make the following change in your medication:  Increase flecainide to 100 mg in the morning and 75 mg at night.  Lab Work: None ordered.  If you have labs (blood work) drawn today and your tests are completely normal, you will receive your results only by: MyChart Message (if you have MyChart) OR A paper copy in the mail If you have any lab test that is abnormal or we need to change your treatment, we will call you to review the results.  Testing/Procedures: None ordered.  Follow-Up: At [facility], you and your health needs are our priority.  As part of our continuing mission to provide you with exceptional heart care, we have created designated Provider Care Teams.  These Care Teams include your primary Cardiologist (physician) and Advanced Practice Providers (APPs -  Physician Assistants and Nurse Practitioners) who all work together to provide you with the care you need, when you need it.  Your next appointment:   3 weeks for EKG  The format for your next appointment:   In Person  Provider:   Lewayne Bunting, MD{or one of the following Advanced Practice Providers on your designated Care Team:   Francis Dowse, [place] Casimiro Needle "Mardelle Matte" [place], [place] Earnest Rosier, NP   Important Information About Sugar

## 2023-10-06 ENCOUNTER — Telehealth: Payer: Self-pay | Admitting: Neurology

## 2023-10-06 NOTE — Telephone Encounter (Signed)
 Pt asking if scan of a CT can be faxed or emailed for appt with Dr. Lucia Gaskins. Have called and LVM and no one has called me back. Would like a call back from Medical Records

## 2023-10-15 ENCOUNTER — Ambulatory Visit: Payer: PPO | Attending: Cardiovascular Disease | Admitting: *Deleted

## 2023-10-15 VITALS — BP 124/78 | HR 59 | Wt 133.0 lb

## 2023-10-15 DIAGNOSIS — I480 Paroxysmal atrial fibrillation: Secondary | ICD-10-CM

## 2023-10-15 MED ORDER — FLECAINIDE ACETATE 50 MG PO TABS: ORAL_TABLET | ORAL | 3 refills | Status: DC

## 2023-10-15 NOTE — Progress Notes (Signed)
   Nurse Visit   Date of Encounter: 10/15/2023 ID: Kellie Humphrey, DOB 1946/01/23, MRN [id number]  PCP:  Cleatis Polka., MD    [facility] Providers Cardiologist:  Meriam Sprague, MD (Inactive) Electrophysiologist:  Lewayne Bunting, MD      Visit Details   VS:  There were no vitals taken for this visit. , BMI There is no height or weight on file to calculate BMI.  Wt Readings from Last 3 Encounters:  09/24/23 133 lb 9.6 oz (60.6 kg)  08/04/23 132 lb (59.9 kg)  05/26/23 130 lb (59 kg)     Reason for visit: EKG Performed today: Vitals, EKG, Provider consulted: , and Education Changes (medications, testing, etc.) : none Length of Visit: 20 minutes  Patient tolerating increased dose of flecainide 100 mg every am and 75 mg every pm.  Continues metoprolol 25 mg BID.  Her palpitations have decreased a lot during the day but pick back up by suppertime.  Some days are worse than others.    Medications Adjustments/Labs and Tests Ordered: Orders Placed This Encounter  Procedures   EKG 12-Lead   No orders of the defined types were placed in this encounter.    Jerald Kief, RN  10/15/2023 12:07 PM

## 2023-10-21 ENCOUNTER — Ambulatory Visit
Admission: RE | Admit: 2023-10-21 | Discharge: 2023-10-21 | Disposition: A | Discharge status: Home / Self Care | Payer: PPO | Source: Ambulatory Visit | Attending: Obstetrics and Gynecology | Admitting: Obstetrics and Gynecology

## 2023-10-21 DIAGNOSIS — Z1231 Encounter for screening mammogram for malignant neoplasm of breast: Secondary | ICD-10-CM | POA: Diagnosis not present

## 2023-10-21 DIAGNOSIS — Z0000 Encounter for general adult medical examination without abnormal findings: Secondary | ICD-10-CM

## 2023-10-21 HISTORY — DX: Personal history of irradiation: Z92.3

## 2023-10-21 HISTORY — DX: Malignant (primary) neoplasm, unspecified: C80.1

## 2023-11-02 ENCOUNTER — Telehealth: Payer: Self-pay | Admitting: *Deleted

## 2023-11-02 NOTE — Telephone Encounter (Signed)
R/c medical records on 11/02/2023.

## 2023-11-16 DIAGNOSIS — N958 Other specified menopausal and perimenopausal disorders: Secondary | ICD-10-CM | POA: Diagnosis not present

## 2023-11-16 DIAGNOSIS — Z779 Other contact with and (suspected) exposures hazardous to health: Secondary | ICD-10-CM | POA: Diagnosis not present

## 2023-11-16 DIAGNOSIS — M8588 Other specified disorders of bone density and structure, other site: Secondary | ICD-10-CM | POA: Diagnosis not present

## 2023-11-16 DIAGNOSIS — N952 Postmenopausal atrophic vaginitis: Secondary | ICD-10-CM | POA: Diagnosis not present

## 2023-11-16 DIAGNOSIS — Z6820 Body mass index (BMI) 20.0-20.9, adult: Secondary | ICD-10-CM | POA: Diagnosis not present

## 2023-11-16 DIAGNOSIS — Z853 Personal history of malignant neoplasm of breast: Secondary | ICD-10-CM | POA: Diagnosis not present

## 2023-11-18 ENCOUNTER — Institutional Professional Consult (permissible substitution): Payer: PPO | Admitting: Neurology

## 2023-11-21 ENCOUNTER — Emergency Department (HOSPITAL_COMMUNITY): Payer: PPO

## 2023-11-21 ENCOUNTER — Other Ambulatory Visit: Payer: Self-pay

## 2023-11-21 ENCOUNTER — Observation Stay (HOSPITAL_COMMUNITY)
Admission: EM | Admit: 2023-11-21 | Discharge: 2023-11-22 | Disposition: A | Discharge status: Home / Self Care | Payer: PPO | Attending: Family Medicine | Admitting: Family Medicine

## 2023-11-21 ENCOUNTER — Encounter (HOSPITAL_COMMUNITY): Payer: Self-pay | Admitting: *Deleted

## 2023-11-21 ENCOUNTER — Observation Stay (HOSPITAL_COMMUNITY): Payer: PPO

## 2023-11-21 DIAGNOSIS — Z7982 Long term (current) use of aspirin: Secondary | ICD-10-CM | POA: Diagnosis not present

## 2023-11-21 DIAGNOSIS — R944 Abnormal results of kidney function studies: Secondary | ICD-10-CM | POA: Insufficient documentation

## 2023-11-21 DIAGNOSIS — Z85828 Personal history of other malignant neoplasm of skin: Secondary | ICD-10-CM | POA: Diagnosis not present

## 2023-11-21 DIAGNOSIS — E039 Hypothyroidism, unspecified: Secondary | ICD-10-CM | POA: Insufficient documentation

## 2023-11-21 DIAGNOSIS — I480 Paroxysmal atrial fibrillation: Secondary | ICD-10-CM | POA: Insufficient documentation

## 2023-11-21 DIAGNOSIS — Z853 Personal history of malignant neoplasm of breast: Secondary | ICD-10-CM | POA: Insufficient documentation

## 2023-11-21 DIAGNOSIS — N280 Ischemia and infarction of kidney: Secondary | ICD-10-CM | POA: Insufficient documentation

## 2023-11-21 DIAGNOSIS — I6782 Cerebral ischemia: Secondary | ICD-10-CM | POA: Diagnosis not present

## 2023-11-21 DIAGNOSIS — R002 Palpitations: Secondary | ICD-10-CM | POA: Diagnosis not present

## 2023-11-21 DIAGNOSIS — H34232 Retinal artery branch occlusion, left eye: Secondary | ICD-10-CM

## 2023-11-21 DIAGNOSIS — G459 Transient cerebral ischemic attack, unspecified: Secondary | ICD-10-CM | POA: Diagnosis not present

## 2023-11-21 DIAGNOSIS — R7989 Other specified abnormal findings of blood chemistry: Secondary | ICD-10-CM

## 2023-11-21 DIAGNOSIS — H5462 Unqualified visual loss, left eye, normal vision right eye: Secondary | ICD-10-CM | POA: Diagnosis not present

## 2023-11-21 DIAGNOSIS — I700 Atherosclerosis of aorta: Secondary | ICD-10-CM | POA: Diagnosis not present

## 2023-11-21 DIAGNOSIS — H53122 Transient visual loss, left eye: Principal | ICD-10-CM

## 2023-11-21 DIAGNOSIS — Z79899 Other long term (current) drug therapy: Secondary | ICD-10-CM | POA: Insufficient documentation

## 2023-11-21 DIAGNOSIS — I4891 Unspecified atrial fibrillation: Secondary | ICD-10-CM | POA: Diagnosis present

## 2023-11-21 DIAGNOSIS — H5340 Unspecified visual field defects: Secondary | ICD-10-CM | POA: Diagnosis not present

## 2023-11-21 DIAGNOSIS — R29818 Other symptoms and signs involving the nervous system: Secondary | ICD-10-CM | POA: Diagnosis not present

## 2023-11-21 DIAGNOSIS — I6521 Occlusion and stenosis of right carotid artery: Secondary | ICD-10-CM | POA: Diagnosis not present

## 2023-11-21 DIAGNOSIS — Z981 Arthrodesis status: Secondary | ICD-10-CM | POA: Diagnosis not present

## 2023-11-21 LAB — ETHANOL: Alcohol, Ethyl (B): <10 mg/dL (ref <10)

## 2023-11-21 LAB — CBC
HCT: 43 % (ref 36.0–46.0)
Hemoglobin: 14.1 g/dL (ref 12.0–15.0)
MCH: 29.7 pg (ref 26.0–34.0)
MCHC: 32.8 g/dL (ref 30.0–36.0)
MCV: 90.7 fL (ref 80.0–100.0)
Platelets: 266 10*3/uL (ref 150–400)
RBC: 4.74 MIL/uL (ref 3.87–5.11)
RDW: 14.1 % (ref 11.5–15.5)
WBC: 7.8 10*3/uL (ref 4.0–10.5)
nRBC: 0 % (ref 0.0–0.2)

## 2023-11-21 LAB — DIFFERENTIAL
Abs Immature Granulocytes: 0.02 10*3/uL (ref 0.00–0.07)
Basophils Absolute: 0 10*3/uL (ref 0.0–0.1)
Basophils Relative: 1 %
Eosinophils Absolute: 0.2 10*3/uL (ref 0.0–0.5)
Eosinophils Relative: 2 %
Immature Granulocytes: 0 %
Lymphocytes Relative: 14 %
Lymphs Abs: 1.1 10*3/uL (ref 0.7–4.0)
Monocytes Absolute: 0.8 10*3/uL (ref 0.1–1.0)
Monocytes Relative: 11 %
Neutro Abs: 5.6 10*3/uL (ref 1.7–7.7)
Neutrophils Relative %: 72 %

## 2023-11-21 LAB — I-STAT CHEM 8, ED
BUN: 20 mg/dL (ref 8–23)
Calcium, Ion: 1.09 mmol/L — ABNORMAL LOW (ref 1.15–1.40)
Chloride: 104 mmol/L (ref 98–111)
Creatinine, Ser: 1.3 mg/dL — ABNORMAL HIGH (ref 0.44–1.00)
Glucose, Bld: 101 mg/dL — ABNORMAL HIGH (ref 70–99)
HCT: 45 % (ref 36.0–46.0)
Hemoglobin: 15.3 g/dL — ABNORMAL HIGH (ref 12.0–15.0)
Potassium: 3.9 mmol/L (ref 3.5–5.1)
Sodium: 139 mmol/L (ref 135–145)
TCO2: 25 mmol/L (ref 22–32)

## 2023-11-21 LAB — RAPID URINE DRUG SCREEN, HOSP PERFORMED
Amphetamines: NOT DETECTED
Barbiturates: NOT DETECTED
Benzodiazepines: NOT DETECTED
Cocaine: NOT DETECTED
Opiates: NOT DETECTED
Tetrahydrocannabinol: NOT DETECTED

## 2023-11-21 LAB — COMPREHENSIVE METABOLIC PANEL
ALT: 20 U/L (ref 0–44)
AST: 27 U/L (ref 15–41)
Albumin: 4.2 g/dL (ref 3.5–5.0)
Alkaline Phosphatase: 97 U/L (ref 38–126)
Anion gap: 13 (ref 5–15)
BUN: 17 mg/dL (ref 8–23)
CO2: 23 mmol/L (ref 22–32)
Calcium: 9.6 mg/dL (ref 8.9–10.3)
Chloride: 103 mmol/L (ref 98–111)
Creatinine, Ser: 1.18 mg/dL — ABNORMAL HIGH (ref 0.44–1.00)
GFR, Estimated: 48 mL/min — ABNORMAL LOW (ref >60)
Glucose, Bld: 104 mg/dL — ABNORMAL HIGH (ref 70–99)
Potassium: 4 mmol/L (ref 3.5–5.1)
Sodium: 139 mmol/L (ref 135–145)
Total Bilirubin: 0.6 mg/dL (ref 0.0–1.2)
Total Protein: 7.2 g/dL (ref 6.5–8.1)

## 2023-11-21 LAB — LIPID PANEL
Cholesterol: 203 mg/dL — ABNORMAL HIGH (ref 0–200)
HDL: 68 mg/dL (ref >40)
LDL Cholesterol: 109 mg/dL — ABNORMAL HIGH (ref 0–99)
Total CHOL/HDL Ratio: 3 {ratio}
Triglycerides: 129 mg/dL (ref <150)
VLDL: 26 mg/dL (ref 0–40)

## 2023-11-21 LAB — MAGNESIUM: Magnesium: 2.2 mg/dL (ref 1.7–2.4)

## 2023-11-21 LAB — HEMOGLOBIN A1C
Hgb A1c MFr Bld: 5.5 % (ref 4.8–5.6)
Mean Plasma Glucose: 111.15 mg/dL

## 2023-11-21 LAB — PROTIME-INR
INR: 1 (ref 0.8–1.2)
Prothrombin Time: 13.4 s (ref 11.4–15.2)

## 2023-11-21 LAB — URINALYSIS, ROUTINE W REFLEX MICROSCOPIC
Bilirubin Urine: NEGATIVE
Glucose, UA: NEGATIVE mg/dL
Hgb urine dipstick: NEGATIVE
Ketones, ur: NEGATIVE mg/dL
Leukocytes,Ua: NEGATIVE
Nitrite: NEGATIVE
Protein, ur: NEGATIVE mg/dL
Specific Gravity, Urine: 1.018 (ref 1.005–1.030)
pH: 7 (ref 5.0–8.0)

## 2023-11-21 LAB — APTT: aPTT: 29 s (ref 24–36)

## 2023-11-21 MED ORDER — ACETAMINOPHEN 650 MG RE SUPP: 650.0000 mg | Freq: Four times a day (QID) | RECTAL | Status: DC | PRN

## 2023-11-21 MED ORDER — GABAPENTIN 300 MG PO CAPS
600.0000 mg | ORAL_CAPSULE | Freq: Two times a day (BID) | ORAL | Status: DC
  Administered 2023-11-21 – 2023-11-22 (×3): 600 mg via ORAL
  Filled 2023-11-21 (×3): qty 2

## 2023-11-21 MED ORDER — ATORVASTATIN CALCIUM 80 MG PO TABS
80.0000 mg | ORAL_TABLET | Freq: Every day | ORAL | Status: DC
  Administered 2023-11-21 – 2023-11-22 (×2): 80 mg via ORAL
  Filled 2023-11-21 (×2): qty 1

## 2023-11-21 MED ORDER — CLOPIDOGREL BISULFATE 300 MG PO TABS
300.0000 mg | ORAL_TABLET | Freq: Once | ORAL | Status: AC
  Administered 2023-11-21: 300 mg via ORAL
  Filled 2023-11-21: qty 1

## 2023-11-21 MED ORDER — HYDRALAZINE HCL 20 MG/ML IJ SOLN: 10.0000 mg | INTRAMUSCULAR | Status: DC | PRN

## 2023-11-21 MED ORDER — ACETAMINOPHEN 325 MG PO TABS
650.0000 mg | ORAL_TABLET | Freq: Four times a day (QID) | ORAL | Status: DC | PRN
  Administered 2023-11-22 (×2): 650 mg via ORAL
  Filled 2023-11-21 (×2): qty 2

## 2023-11-21 MED ORDER — ONDANSETRON HCL 4 MG/2ML IJ SOLN: 4.0000 mg | Freq: Four times a day (QID) | INTRAMUSCULAR | Status: DC | PRN

## 2023-11-21 MED ORDER — IOHEXOL 350 MG/ML SOLN
75.0000 mL | Freq: Once | INTRAVENOUS | Status: AC | PRN
  Administered 2023-11-21: 75 mL via INTRAVENOUS

## 2023-11-21 MED ORDER — MELATONIN 3 MG PO TABS: 3.0000 mg | ORAL_TABLET | Freq: Every evening | ORAL | Status: DC | PRN

## 2023-11-21 MED ORDER — PRIMIDONE 50 MG PO TABS
50.0000 mg | ORAL_TABLET | Freq: Every day | ORAL | Status: DC
  Administered 2023-11-21: 50 mg via ORAL
  Filled 2023-11-21 (×3): qty 1

## 2023-11-21 MED ORDER — FLECAINIDE ACETATE 50 MG PO TABS
75.0000 mg | ORAL_TABLET | Freq: Every day | ORAL | Status: DC
  Administered 2023-11-21: 75 mg via ORAL
  Filled 2023-11-21 (×3): qty 2

## 2023-11-21 MED ORDER — CLOPIDOGREL BISULFATE 75 MG PO TABS
75.0000 mg | ORAL_TABLET | Freq: Every day | ORAL | Status: DC
  Administered 2023-11-22: 75 mg via ORAL
  Filled 2023-11-21: qty 1

## 2023-11-21 MED ORDER — STROKE: EARLY STAGES OF RECOVERY BOOK: Freq: Once | Status: DC

## 2023-11-21 MED ORDER — ASPIRIN 81 MG PO CHEW
81.0000 mg | CHEWABLE_TABLET | Freq: Every day | ORAL | Status: DC
  Administered 2023-11-21 – 2023-11-22 (×2): 81 mg via ORAL
  Filled 2023-11-21 (×2): qty 1

## 2023-11-21 MED ORDER — FLECAINIDE ACETATE 100 MG PO TABS
100.0000 mg | ORAL_TABLET | Freq: Every morning | ORAL | Status: DC
  Administered 2023-11-21 – 2023-11-22 (×2): 100 mg via ORAL
  Filled 2023-11-21: qty 1
  Filled 2023-11-21: qty 2
  Filled 2023-11-21: qty 1

## 2023-11-21 MED ORDER — LEVOTHYROXINE SODIUM 50 MCG PO TABS
50.0000 ug | ORAL_TABLET | Freq: Every day | ORAL | Status: DC
  Administered 2023-11-21 – 2023-11-22 (×2): 50 ug via ORAL
  Filled 2023-11-21 (×2): qty 1

## 2023-11-21 MED ORDER — LACTATED RINGERS IV BOLUS
1000.0000 mL | Freq: Once | INTRAVENOUS | Status: AC
Start: 2023-11-21 — End: 2023-11-22
  Administered 2023-11-21: 1000 mL via INTRAVENOUS

## 2023-11-21 NOTE — H&P (Signed)
 History and Physical    Patient: Kellie Humphrey YNW:[id number] DOB: 1946/03/14 DOA: 11/21/2023 DOS: the patient was seen and examined on 11/21/2023 PCP: Cleatis Polka., MD  Patient coming from: Home  Chief Complaint:  Chief Complaint  Patient presents with   Visual Field Change   HPI: Kellie Humphrey is a 78 y.o. female with medical history significant of medical history is as listed below.  I do not find any history of atrial fibrillation from the patient or the medical record.  Patient was in her usual state of health till approximately 11 PM last evening when she reports an abrupt onset of visual field defect/blurring/blackening on the left lower visual field of the left eye.  This was a monocular partial visual field loss.  Patient actually happened to see ophthalmology the same evening.  I have actually scanned their note in the chart.  Please see the media section it reads as follows "partial branch retinal artery occlusion OS appears to have some plaque in artery as it exits.  Not completely occluding blood flow."  Patient was subsequently referred to [facility], ER.  By the patient time of arrival here patient's visual field defect had completely resolved.  No tPA was given.  Neurology engaged.  Patient is completely asymptomatic since.   Patient has had no focal weakness no dysarthria no trouble swallowing no headache no neck pain no trauma.  Has passed swallow screen, tolerating diet. Review of Systems: As mentioned in the history of present illness. All other systems reviewed and are negative. Past Medical History:  Diagnosis Date   Anxiety disorder    Arthritis    Breast cancer (HCC) 07/25/2008   Cancer (HCC)    Cataract    Essential tremor    /familial   Heart murmur 2021   corrected with valve surgery   Hypothyroidism    Interstitial cystitis    Neuropathy    Nonrheumatic mitral valve regurgitation    PAC (premature atrial contraction)    Paresthesias  07/29/2014   Personal history of radiation therapy    PREMATURE ATRIAL CONTRACTIONS 05/08/2009   Qualifier: Diagnosis of  By: Trevor Iha, RN, [name]     Radiculopathy, cervical region    RLS (restless legs syndrome)    S/P minimally-invasive mitral valve repair 02/10/2020   Complex valvuloplasty including artificial Gore-tex neochord placement x8 with edge-to-edge suture plication of anterior commissure and 32 mm [name] 4D ring annuloplasty via right mini thoracotomy approach   Thyroid disease    Varicose veins of right lower extremity with complications 03/25/2016   Past Surgical History:  Procedure Laterality Date   bilateral cataract repair  july/aug 2021   Dr Darel Hong   BREAST BIOPSY  2009   BREAST LUMPECTOMY  07/25/2008   CARDIAC CATHETERIZATION  2021   DILATION AND CURETTAGE OF UTERUS     age 33   EYE SURGERY Left    Pterygium   HEMANGIOMA EXCISION Left 1990   arm   MELANOMA EXCISION Right 1980   thigh   MITRAL VALVE REPAIR Right 02/10/2020   Procedure: MINIMALLY INVASIVE MITRAL VALVE REPAIR (MVR) using Memo 4D 32 MM Mitral Valve Ring.;  Surgeon: Purcell Nails, MD;  Location: [facility] OR;  Service: Open Heart Surgery;  Laterality: Right;   NECK SURGERY  10/2009   C5-6 fusion   RIGHT/LEFT HEART CATH AND CORONARY ANGIOGRAPHY N/A 01/04/2020   Procedure: RIGHT/LEFT HEART CATH AND CORONARY ANGIOGRAPHY;  Surgeon: Kathleene Hazel, MD;  Location:  [facility] INVASIVE CV LAB;  Service: Cardiovascular;  Laterality: N/A;   ROBOTIC ASSISTED BILATERAL SALPINGO OOPHERECTOMY Bilateral 10/01/2021   Procedure: XI ROBOTIC ASSISTED BILATERAL SALPINGO OOPHORECTOMY;  Surgeon: Carver Fila, MD;  Location: [facility] ORS;  Service: Gynecology;  Laterality: Bilateral;   SHOULDER ARTHROSCOPY Left 2016   spider vein treatment     TEE WITHOUT CARDIOVERSION N/A 06/30/2018   Procedure: TRANSESOPHAGEAL ECHOCARDIOGRAM (TEE);  Surgeon: Quintella Reichert, MD;  Location: [facility] ENDOSCOPY;  Service: Cardiovascular;   Laterality: N/A;   TEE WITHOUT CARDIOVERSION N/A 12/12/2019   Procedure: TRANSESOPHAGEAL ECHOCARDIOGRAM (TEE);  Surgeon: Lars Masson, MD;  Location: [facility] ENDOSCOPY;  Service: Cardiovascular;  Laterality: N/A;   TEE WITHOUT CARDIOVERSION N/A 02/10/2020   Procedure: TRANSESOPHAGEAL ECHOCARDIOGRAM (TEE);  Surgeon: Purcell Nails, MD;  Location: [facility] OR;  Service: Open Heart Surgery;  Laterality: N/A;   TONSILLECTOMY     as a child   uterine fibroids removed  2012   Social History:  reports that she has never smoked. She has never used smokeless tobacco. She reports current alcohol use. She reports that she does not use drugs.  Allergies  Allergen Reactions   Codeine Nausea And Vomiting   Phenergan [Promethazine] Other (See Comments)    Long QT   Betadine [Povidone Iodine] Other (See Comments)    Irritation - vaginal   Oxycodone Nausea And Vomiting    Family History  Problem Relation Age of Onset   Coronary artery disease Mother        s/p MI (30s)   Hypertension Mother    Osteoporosis Mother        wrist fracture   Alzheimer's disease Mother    Osteoarthritis Father    Neuropathy Father    Tremor Father    Other Father        lymphedema   Fibromyalgia Sister    Heart disease Maternal Grandmother    Colon polyps Neg Hx    Esophageal cancer Neg Hx    Pancreatic cancer Neg Hx    Stomach cancer Neg Hx    Rectal cancer Neg Hx    Ovarian cancer Neg Hx    Breast cancer Neg Hx    Colon cancer Neg Hx    Prostate cancer Neg Hx    Endometrial cancer Neg Hx    BRCA 1/2 Neg Hx     Prior to Admission medications   Medication Sig Start Date End Date Taking? Authorizing Provider  acetaminophen (TYLENOL) 500 MG tablet Take 500-1,000 mg by mouth as needed for mild pain (pain score 1-3).   Yes [provider]  aspirin EC 81 MG tablet Take 1 tablet (81 mg total) by mouth daily. Swallow whole. 07/04/20  Yes Lars Masson, MD  Biotin 5000 MCG CAPS Take 5,000 mcg by mouth  daily.   Yes [provider]  Cholecalciferol (VITAMIN D) 2000 units tablet Take 2,000 Units by mouth daily.   Yes [provider]  Cyanocobalamin (B-12) 5000 MCG CAPS Take 5,000 mcg by mouth once a week. Mondays   Yes [provider]  flecainide (TAMBOCOR) 50 MG tablet Take two tablets (100 mg) by mouth every morning and one and half tablets (75 mg) every evening 10/15/23  Yes Marinus Maw, MD  gabapentin (NEURONTIN) 600 MG tablet Take 600 mg by mouth 2 (two) times daily.   Yes [provider]  levothyroxine (SYNTHROID) 50 MCG tablet Take 1 tablet (50 mcg total) by mouth daily before breakfast. 11/13/15  Yes Magrinat, Valentino Hue, MD  melatonin 5 MG TABS Take 5 mg by mouth at bedtime.   Yes [provider]  metoprolol tartrate (LOPRESSOR) 25 MG tablet Take 1 tablet (25 mg total) by mouth 2 (two) times daily. 05/20/23  Yes Conte, Tessa N, PA-C  metroNIDAZOLE (METROCREAM) 0.75 % cream Apply 1 Application topically as needed.   Yes [provider]  primidone (MYSOLINE) 50 MG tablet Take 50 mg by mouth at bedtime.   Yes [provider]  SYSTANE ULTRA 0.4-0.3 % SOLN Place 1 drop into the left eye every 4 (four) hours as needed (dry/irritated eyes. (4-6 times daily)).  12/19/19  Yes [provider]    Physical Exam: Vitals:   11/21/23 0600 11/21/23 0845 11/21/23 1030 11/21/23 1145  BP: (!) 150/88 119/76 117/76 106/77  Pulse: 63 61 (!) 59 (!) 57  Resp: 13 17 18 15   Temp: 98.1 F (36.7 C) 98 F (36.7 C)    TempSrc: Oral Oral    SpO2: 100% 98% 98% 99%  Weight:      Height:       General: Patient is alert awake oriented x 3 no distress Respiratory exam: Bilateral intravesicular Cardiovascular exam S1-S2 normal Abdomen all quadrants soft nontender Extremities warm without edema. Extraocular movements are preserved.  Pupillary reactions are good to light both sides.  Nondilated funduscopy exam was attempted.  No gross  abnormality seen in left  eye. Data Reviewed:  Labs on Admission:  Results for orders placed or performed during the hospital encounter of 11/21/23 (from the past 24 hours)  Ethanol     Status: None   Collection Time: 11/21/23  1:35 AM  Result Value Ref Range   Alcohol, Ethyl (B) <10 <10 mg/dL  Protime-INR     Status: None   Collection Time: 11/21/23  1:35 AM  Result Value Ref Range   Prothrombin Time 13.4 11.4 - 15.2 seconds   INR 1.0 0.8 - 1.2  APTT     Status: None   Collection Time: 11/21/23  1:35 AM  Result Value Ref Range   aPTT 29 24 - 36 seconds  CBC     Status: None   Collection Time: 11/21/23  1:35 AM  Result Value Ref Range   WBC 7.8 4.0 - 10.5 K/uL   RBC 4.74 3.87 - 5.11 MIL/uL   Hemoglobin 14.1 12.0 - 15.0 g/dL   HCT 21.3 08.6 - 57.8 %   MCV 90.7 80.0 - 100.0 fL   MCH 29.7 26.0 - 34.0 pg   MCHC 32.8 30.0 - 36.0 g/dL   RDW 46.9 62.9 - 52.8 %   Platelets 266 150 - 400 K/uL   nRBC 0.0 0.0 - 0.2 %  Differential     Status: None   Collection Time: 11/21/23  1:35 AM  Result Value Ref Range   Neutrophils Relative % 72 %   Neutro Abs 5.6 1.7 - 7.7 K/uL   Lymphocytes Relative 14 %   Lymphs Abs 1.1 0.7 - 4.0 K/uL   Monocytes Relative 11 %   Monocytes Absolute 0.8 0.1 - 1.0 K/uL   Eosinophils Relative 2 %   Eosinophils Absolute 0.2 0.0 - 0.5 K/uL   Basophils Relative 1 %   Basophils Absolute 0.0 0.0 - 0.1 K/uL   Immature Granulocytes 0 %   Abs Immature Granulocytes 0.02 0.00 - 0.07 K/uL  Comprehensive metabolic panel     Status: Abnormal   Collection Time: 11/21/23  1:35 AM  Result Value Ref Range   Sodium 139 135 - 145 mmol/L   Potassium 4.0 3.5 - 5.1 mmol/L   Chloride 103 98 - 111 mmol/L   CO2 23 22 - 32 mmol/L   Glucose, Bld 104 (H) 70 - 99 mg/dL   BUN 17 8 - 23 mg/dL   Creatinine, Ser 4.09 (H) 0.44 - 1.00 mg/dL   Calcium 9.6 8.9 - 81.1 mg/dL   Total Protein 7.2 6.5 - 8.1 g/dL   Albumin 4.2 3.5 - 5.0 g/dL   AST 27 15 - 41 U/L   ALT 20 0 - 44 U/L    Alkaline Phosphatase 97 38 - 126 U/L   Total Bilirubin 0.6 0.0 - 1.2 mg/dL   GFR, Estimated 48 (L) >60 mL/min   Anion gap 13 5 - 15  Magnesium     Status: None   Collection Time: 11/21/23  1:35 AM  Result Value Ref Range   Magnesium 2.2 1.7 - 2.4 mg/dL  Lipid panel     Status: Abnormal   Collection Time: 11/21/23  1:35 AM  Result Value Ref Range   Cholesterol 203 (H) 0 - 200 mg/dL   Triglycerides 914 <782 mg/dL   HDL 68 >95 mg/dL   Total CHOL/HDL Ratio 3.0 RATIO   VLDL 26 0 - 40 mg/dL   LDL Cholesterol 621 (H) 0 - 99 mg/dL  Hemoglobin H0Q     Status: None   Collection Time: 11/21/23  1:35 AM  Result Value Ref Range   Hgb A1c MFr Bld 5.5 4.8 - 5.6 %   Mean Plasma Glucose 111.15 mg/dL  I-stat chem 8, ED     Status: Abnormal   Collection Time: 11/21/23  1:39 AM  Result Value Ref Range   Sodium 139 135 - 145 mmol/L   Potassium 3.9 3.5 - 5.1 mmol/L   Chloride 104 98 - 111 mmol/L   BUN 20 8 - 23 mg/dL   Creatinine, Ser 6.57 (H) 0.44 - 1.00 mg/dL   Glucose, Bld 846 (H) 70 - 99 mg/dL   Calcium, Ion 9.62 (L) 1.15 - 1.40 mmol/L   TCO2 25 22 - 32 mmol/L   Hemoglobin 15.3 (H) 12.0 - 15.0 g/dL   HCT 95.2 84.1 - 32.4 %  Urine rapid drug screen (hosp performed)     Status: None   Collection Time: 11/21/23  6:00 AM  Result Value Ref Range   Opiates NONE DETECTED NONE DETECTED   Cocaine NONE DETECTED NONE DETECTED   Benzodiazepines NONE DETECTED NONE DETECTED   Amphetamines NONE DETECTED NONE DETECTED   Tetrahydrocannabinol NONE DETECTED NONE DETECTED   Barbiturates NONE DETECTED NONE DETECTED  Urinalysis, Routine w reflex microscopic -Urine, Clean Catch     Status: Abnormal   Collection Time: 11/21/23  6:00 AM  Result Value Ref Range   Color, Urine COLORLESS (A) YELLOW   APPearance CLEAR CLEAR   Specific Gravity, Urine 1.018 1.005 - 1.030   pH 7.0 5.0 - 8.0   Glucose, UA NEGATIVE NEGATIVE mg/dL   Hgb urine dipstick NEGATIVE NEGATIVE   Bilirubin Urine NEGATIVE NEGATIVE   Ketones,  ur NEGATIVE NEGATIVE mg/dL   Protein, ur NEGATIVE NEGATIVE mg/dL   Nitrite NEGATIVE NEGATIVE   Leukocytes,Ua NEGATIVE NEGATIVE   Basic Metabolic Panel: Recent Labs  Lab 11/21/23 0135 11/21/23 0139  NA 139 139  K 4.0 3.9  CL 103 104  CO2 23  --   GLUCOSE 104* 101*  BUN 17 20  CREATININE 1.18* 1.30*  CALCIUM 9.6  --   MG 2.2  --    Liver Function Tests: Recent Labs  Lab 11/21/23 0135  AST 27  ALT 20  ALKPHOS 97  BILITOT 0.6  PROT 7.2  ALBUMIN 4.2   No results for input(s): "LIPASE", "AMYLASE" in the last 168 hours. No results for input(s): "AMMONIA" in the last 168 hours. CBC: Recent Labs  Lab 11/21/23 0135 11/21/23 0139  WBC 7.8  --   NEUTROABS 5.6  --   HGB 14.1 15.3*  HCT 43.0 45.0  MCV 90.7  --   PLT 266  --    Cardiac Enzymes: No results for input(s): "CKTOTAL", "CKMB", "CKMBINDEX", "TROPONINIHS" in the last 168 hours.  BNP (last 3 results) No results for input(s): "PROBNP" in the last 8760 hours. CBG: No results for input(s): "GLUCAP" in the last 168 hours.  Radiological Exams on Admission:  MR BRAIN WO CONTRAST Result Date: 11/21/2023 CLINICAL DATA:  78 year old female code stroke presentation. Abnormal vision. EXAM: MRI HEAD WITHOUT CONTRAST TECHNIQUE: Multiplanar, multiecho pulse sequences of the brain and surrounding structures were obtained without intravenous contrast. COMPARISON:  Head CT 0143 hours today, CTA head and neck 0150 hours. FINDINGS: Brain: No restricted diffusion to suggest acute infarction. No midline shift, mass effect, evidence of mass lesion, ventriculomegaly, extra-axial collection or acute intracranial hemorrhage. Cervicomedullary junction and pituitary are within normal limits. Cerebral volume is within normal limits for age. Mild for age nonspecific T2/FLAIR heterogeneity in the cerebral white matter and the pons. However, on SWI there are multiple chronic microhemorrhages in the brainstem (series 11, image 30), and a small  number of microhemorrhages scattered in both cerebral hemispheres. The extent is less than expected for amyloid angiopathy. No cortical encephalomalacia is identified. Vascular: Major intracranial vascular flow voids are preserved. Skull and upper cervical spine: Negative. Visualized bone marrow signal is within normal limits. Sinuses/Orbits: Normal suprasellar cistern. Normal optic chiasm. Noncontrast cavernous sinus appears symmetric and unremarkable. Postoperative changes to both globes, otherwise negative orbits. Paranasal Visualized paranasal sinuses and mastoids are stable and well aerated. Other: Grossly normal visible internal auditory structures. Negative visible scalp and face. IMPRESSION: 1. No acute intracranial abnormality. 2. Chronic small vessel disease, primarily in the form of chronic microhemorrhages concentrated in the brainstem, mildly scattered in the cerebral hemispheres. The findings do not rise to the level of amyloid angiopathy. Electronically Signed   By: Odessa Fleming M.D.   On: 11/21/2023 04:36   CT ANGIO HEAD NECK W WO CM (CODE STROKE) Result Date: 11/21/2023 CLINICAL DATA:  Acute neurologic deficit.  Visual field cut. EXAM: CT ANGIOGRAPHY HEAD AND NECK WITH AND WITHOUT CONTRAST TECHNIQUE: Multidetector CT imaging of the head and neck was performed using the standard protocol during bolus administration of intravenous contrast. Multiplanar CT image reconstructions and MIPs were obtained to evaluate the vascular anatomy. Carotid stenosis measurements (when applicable) are obtained utilizing NASCET criteria, using the distal internal carotid diameter as the denominator. RADIATION DOSE REDUCTION: This exam was performed according to the departmental dose-optimization program which includes automated exposure control, adjustment of the mA and/or kV according to patient size and/or use of iterative reconstruction technique. CONTRAST:  75mL OMNIPAQUE IOHEXOL 350 MG/ML SOLN COMPARISON:  None  Available. FINDINGS: Delete CTA NECK FINDINGS Skeleton: No acute abnormality or high grade bony spinal canal stenosis. C5-6 ACDF. Other neck: Normal pharynx, larynx and major salivary glands. No cervical lymphadenopathy. Unremarkable thyroid gland. Upper chest: No pneumothorax or pleural effusion. No nodules or masses. Aortic arch:  There is minimal calcific atherosclerosis of the aortic arch. Conventional 3 vessel aortic branching pattern. RIGHT carotid system: No dissection, occlusion or aneurysm. Mild atherosclerotic calcification at the carotid bifurcation without hemodynamically significant stenosis. LEFT carotid system: Normal without aneurysm, dissection or stenosis. Vertebral arteries: Codominant configuration. There is no dissection, occlusion or flow-limiting stenosis to the skull base (V1-V3 segments). CTA HEAD FINDINGS POSTERIOR CIRCULATION: Vertebral arteries are normal. No proximal occlusion of the anterior or inferior cerebellar arteries. Basilar artery is normal. Superior cerebellar arteries are normal. Posterior cerebral arteries are normal. ANTERIOR CIRCULATION: Intracranial internal carotid arteries are normal. Anterior cerebral arteries are normal. Middle cerebral arteries are normal. Venous sinuses: As permitted by contrast timing, patent. Anatomic variants: None Review of the MIP images confirms the above findings. IMPRESSION: 1. No emergent large vessel occlusion or hemodynamically significant stenosis of the head or neck. 2. Mild right carotid bifurcation atherosclerosis without hemodynamically significant stenosis. Electronically Signed   By: Deatra Robinson M.D.   On: 11/21/2023 01:59   CT HEAD CODE STROKE WO CONTRAST Result Date: 11/21/2023 CLINICAL DATA:  Code stroke.  Visual field change EXAM: CT HEAD WITHOUT CONTRAST TECHNIQUE: Contiguous axial images were obtained from the base of the skull through the vertex without intravenous contrast. RADIATION DOSE REDUCTION: This exam was  performed according to the departmental dose-optimization program which includes automated exposure control, adjustment of the mA and/or kV according to patient size and/or use of iterative reconstruction technique. COMPARISON:  None Available. FINDINGS: Brain: There is no mass, hemorrhage or extra-axial collection. The size and configuration of the ventricles and extra-axial CSF spaces are normal. There is hypoattenuation of the periventricular white matter, most commonly indicating chronic ischemic microangiopathy. Vascular: No abnormal hyperdensity of the major intracranial arteries or dural venous sinuses. No intracranial atherosclerosis. Skull: The visualized skull base, calvarium and extracranial soft tissues are normal. Sinuses/Orbits: No fluid levels or advanced mucosal thickening of the visualized paranasal sinuses. No mastoid or middle ear effusion. The orbits are normal. ASPECTS [facility] Stroke Program Early CT Score) - Ganglionic level infarction (caudate, lentiform nuclei, internal capsule, insula, M1-M3 cortex): 7 - Supraganglionic infarction (M4-M6 cortex): 3 Total score (0-10 with 10 being normal): 10 IMPRESSION: 1. No acute intracranial abnormality. 2. ASPECTS is 10. 3. Chronic ischemic microangiopathy. These results were communicated to Dr. Ritta Slot at 1:54 am on 11/21/2023 by text page via the [facility] messaging system. Electronically Signed   By: Deatra Robinson M.D.   On: 11/21/2023 01:55    chest X-ray  EKG: Independently reviewed. NSR  No intake/output data recorded. No intake/output data recorded.     Assessment and Plan: Retinal artery occlusion, branch, left Present on admission.  At this time no visual field defect with confrontation testing.  Patient visual field defect has resolved.  Patient has been started on aspirin and Plavix.  I will also start the patient on high intensity statin given her lipid values.  As well as retinal artery occlusion noted on fundus exam  by ophthalmology.  Patient will be maintained on telemetry.  Echo pending.  Patient already on regular diet.  Appreciate neurology help as well.  Elevated serum creatinine This is felt to represent CKD, I do not have any baseline creatinine recently.  I will check a urinalysis and give IV fluids given that patient get contrast for her CAT scan angiography.  Palpitations This is chronic, patient uses flecainide and metoprolol for this.  Patient's last dose of flecainide was last evening.  I will hold  the metoprolol for today.  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  Consider restarting tomorrow morning.  Atrial fibrillation (HCC) I note that this is documented in the chart from 4 years ago, no recent documentation in cardiology notes.  Maintain on telemetry.  Patient may need prolonged loop recorder at discharge.  Continue with Synthroid that patient takes at home  Med rec pending pharmacy input.    Advance Care Planning:   Code Status: Full Code   Consults: neurology  Family Communication: per pateint.  Severity of Illness: The appropriate patient status for this patient is OBSERVATION. Observation status is judged to be reasonable and necessary in order to provide the required intensity of service to ensure the patient's safety. The patient's presenting symptoms, physical exam findings, and initial radiographic and laboratory data in the context of their medical condition is felt to place them at decreased risk for further clinical deterioration. Furthermore, it is anticipated that the patient will be medically stable for discharge from the hospital within 2 midnights of admission.   Author: Nolberto Hanlon, MD 11/21/2023 12:06 PM  For on call review [email or web address].

## 2023-11-21 NOTE — Consult Note (Signed)
 NEUROLOGY CONSULT NOTE   Date of service: November 21, 2023 Patient Name: Kellie Humphrey MRN:  [id number] DOB:  11/23/1945 Chief Complaint: "Visual change" Requesting Provider: Glynn Octave, MD  History of Present Illness  Kellie Humphrey is a 78 y.o. female with hx of PACs, hypothyroidism who presents with visual changes in the left eye.  She states that she was sitting on the couch at 11 PM, and then after standing up, she noticed that her left vision changed.  She called an ophthalmologist (Dr. Burgess Estelle) who arranged to see her in clinic emergently.  He found her to have an altitudinal defect, and was able to visualize some type of occlusive material in the artery itself as it exits the optic nerve.  Due to this, he referred her to the emergency department emergently for stroke evaluation and consideration of thrombolytics.  She arrived within the IV TNK window and therefore code stroke was activated.  Since that time, her symptoms have improved significantly though she still has some mild blurriness.  LKW: 11 PM Modified rankin score: 0-Completely asymptomatic and back to baseline post- stroke IV Thrombolysis: No, mild symptoms EVT: No, no LVO  NIHSS components Score: Comment  1a Level of Conscious 0[x]  1[]  2[]  3[]      1b LOC Questions 0[x]  1[]  2[]       1c LOC Commands 0[x]  1[]  2[]       2 Best Gaze 0[x]  1[]  2[]       3 Visual 0[x]  1[]  2[]  3[]      4 Facial Palsy 0[x]  1[]  2[]  3[]      5a Motor Arm - left 0[x]  1[]  2[]  3[]  4[]  UN[]    5b Motor Arm - Right 0[x]  1[]  2[]  3[]  4[]  UN[]    6a Motor Leg - Left 0[x]  1[]  2[]  3[]  4[]  UN[]    6b Motor Leg - Right 0[x]  1[]  2[]  3[]  4[]  UN[]    7 Limb Ataxia 0[x]  1[]  2[]  3[]  UN[]     8 Sensory 0[x]  1[]  2[]  UN[]      9 Best Language 0[x]  1[]  2[]  3[]      10 Dysarthria 0[x]  1[]  2[]  UN[]      11 Extinct. and Inattention 0[x]  1[]  2[]       TOTAL: 0      Past History   Past Medical History:  Diagnosis Date   Anxiety disorder    Arthritis    Breast  cancer (HCC) 07/25/2008   Cancer (HCC)    Cataract    Essential tremor    /familial   Heart murmur 2021   corrected with valve surgery   Hypothyroidism    Interstitial cystitis    Neuropathy    Nonrheumatic mitral valve regurgitation    PAC (premature atrial contraction)    Paresthesias 07/29/2014   Personal history of radiation therapy    PREMATURE ATRIAL CONTRACTIONS 05/08/2009   Qualifier: Diagnosis of  By: Trevor Iha, RN, [name]     Radiculopathy, cervical region    RLS (restless legs syndrome)    S/P minimally-invasive mitral valve repair 02/10/2020   Complex valvuloplasty including artificial Gore-tex neochord placement x8 with edge-to-edge suture plication of anterior commissure and 32 mm [name] 4D ring annuloplasty via right mini thoracotomy approach   Thyroid disease    Varicose veins of right lower extremity with complications 03/25/2016    Past Surgical History:  Procedure Laterality Date   bilateral cataract repair  july/aug 2021   Dr Darel Hong   BREAST BIOPSY  2009   BREAST LUMPECTOMY  07/25/2008  CARDIAC CATHETERIZATION  2021   DILATION AND CURETTAGE OF UTERUS     age 6   EYE SURGERY Left    Pterygium   HEMANGIOMA EXCISION Left 1990   arm   MELANOMA EXCISION Right 1980   thigh   MITRAL VALVE REPAIR Right 02/10/2020   Procedure: MINIMALLY INVASIVE MITRAL VALVE REPAIR (MVR) using Memo 4D 32 MM Mitral Valve Ring.;  Surgeon: Purcell Nails, MD;  Location: [facility] OR;  Service: Open Heart Surgery;  Laterality: Right;   NECK SURGERY  10/2009   C5-6 fusion   RIGHT/LEFT HEART CATH AND CORONARY ANGIOGRAPHY N/A 01/04/2020   Procedure: RIGHT/LEFT HEART CATH AND CORONARY ANGIOGRAPHY;  Surgeon: Kathleene Hazel, MD;  Location: [facility] INVASIVE CV LAB;  Service: Cardiovascular;  Laterality: N/A;   ROBOTIC ASSISTED BILATERAL SALPINGO OOPHERECTOMY Bilateral 10/01/2021   Procedure: XI ROBOTIC ASSISTED BILATERAL SALPINGO OOPHORECTOMY;  Surgeon: Carver Fila, MD;   Location: [facility] ORS;  Service: Gynecology;  Laterality: Bilateral;   SHOULDER ARTHROSCOPY Left 2016   spider vein treatment     TEE WITHOUT CARDIOVERSION N/A 06/30/2018   Procedure: TRANSESOPHAGEAL ECHOCARDIOGRAM (TEE);  Surgeon: Quintella Reichert, MD;  Location: [facility] ENDOSCOPY;  Service: Cardiovascular;  Laterality: N/A;   TEE WITHOUT CARDIOVERSION N/A 12/12/2019   Procedure: TRANSESOPHAGEAL ECHOCARDIOGRAM (TEE);  Surgeon: Lars Masson, MD;  Location: [facility] ENDOSCOPY;  Service: Cardiovascular;  Laterality: N/A;   TEE WITHOUT CARDIOVERSION N/A 02/10/2020   Procedure: TRANSESOPHAGEAL ECHOCARDIOGRAM (TEE);  Surgeon: Purcell Nails, MD;  Location: [facility] OR;  Service: Open Heart Surgery;  Laterality: N/A;   TONSILLECTOMY     as a child   uterine fibroids removed  2012    Family History: Family History  Problem Relation Age of Onset   Coronary artery disease Mother        s/p MI (87s)   Hypertension Mother    Osteoporosis Mother        wrist fracture   Alzheimer's disease Mother    Osteoarthritis Father    Neuropathy Father    Tremor Father    Other Father        lymphedema   Fibromyalgia Sister    Heart disease Maternal Grandmother    Colon polyps Neg Hx    Esophageal cancer Neg Hx    Pancreatic cancer Neg Hx    Stomach cancer Neg Hx    Rectal cancer Neg Hx    Ovarian cancer Neg Hx    Breast cancer Neg Hx    Colon cancer Neg Hx    Prostate cancer Neg Hx    Endometrial cancer Neg Hx    BRCA 1/2 Neg Hx     Social History  reports that she has never smoked. She has never used smokeless tobacco. She reports current alcohol use. She reports that she does not use drugs.  Allergies  Allergen Reactions   Codeine Nausea And Vomiting   Phenergan [Promethazine] Other (See Comments)    Long QT   Betadine [Povidone Iodine] Other (See Comments)    Irritation - vaginal   Oxycodone Nausea And Vomiting    Medications  No current facility-administered medications for this  encounter.  Current Outpatient Medications:    acetaminophen (TYLENOL) 500 MG tablet, Take 500-1,000 mg by mouth See admin instructions. Take 500 mg at night, may take a second 500 -1000 mg dose as needed for pain, Disp: , Rfl:    aspirin EC 81 MG tablet, Take 1 tablet (81 mg total) by mouth daily.  Swallow whole., Disp: 90 tablet, Rfl: 3   betamethasone dipropionate 0.05 % cream, Apply 1 application  topically as needed., Disp: , Rfl:    Biotin 5000 MCG CAPS, Take 5,000 mcg by mouth daily., Disp: , Rfl:    Cholecalciferol (VITAMIN D) 2000 units tablet, Take 2,000 Units by mouth daily., Disp: , Rfl:    Cyanocobalamin (B-12) 5000 MCG CAPS, Take 5,000 mcg by mouth once a week. Mondays, Disp: , Rfl:    flecainide (TAMBOCOR) 50 MG tablet, Take two tablets (100 mg) by mouth every morning and one and half tablets (75 mg) every evening, Disp: 315 tablet, Rfl: 3   gabapentin (NEURONTIN) 600 MG tablet, Take 600 mg by mouth 2 (two) times daily., Disp: , Rfl:    levothyroxine (SYNTHROID) 50 MCG tablet, Take 1 tablet (50 mcg total) by mouth daily before breakfast., Disp: , Rfl:    melatonin 5 MG TABS, Take 5 mg by mouth at bedtime., Disp: , Rfl:    metoprolol tartrate (LOPRESSOR) 25 MG tablet, Take 1 tablet (25 mg total) by mouth 2 (two) times daily., Disp: , Rfl:    metroNIDAZOLE (METROCREAM) 0.75 % cream, Apply 1 Application topically as needed., Disp: , Rfl:    omeprazole (PRILOSEC) 40 MG capsule, TAKE 1 CAPSULE BY MOUTH TWICE A DAY, Disp: 180 capsule, Rfl: 1   primidone (MYSOLINE) 50 MG tablet, Take 50 mg by mouth at bedtime., Disp: , Rfl:    RESTASIS 0.05 % ophthalmic emulsion, Place 1 drop into the left eye 2 (two) times daily., Disp: , Rfl:    SYSTANE ULTRA 0.4-0.3 % SOLN, Place 1 drop into the left eye every 4 (four) hours as needed (dry/irritated eyes. (4-6 times daily)). , Disp: , Rfl:   Vitals   Vitals:   12-05-2023 0123 Dec 05, 2023 0135  BP: (!) 145/106   Pulse: 68   Resp: 18   Temp: 98.2 F  (36.8 C)   TempSrc: Oral   SpO2: 100%   Weight:  60.3 kg  Height:  5\' 7"  (1.702 m)    Body mass index is 20.82 kg/m.  Physical Exam   Constitutional: Appears well-developed and well-nourished.   Neurologic Examination     Labs/Imaging/Neurodiagnostic studies   CBC:  Recent Labs  Lab 12-05-23 0135 2023-12-05 0139  WBC 7.8  --   NEUTROABS 5.6  --   HGB 14.1 15.3*  HCT 43.0 45.0  MCV 90.7  --   PLT 266  --    Basic Metabolic Panel:  Lab Results  Component Value Date   NA 139 2023/12/05   K 3.9 12-05-2023   CO2 28 09/18/2021   GLUCOSE 101 (H) 05-Dec-2023   BUN 20 12/05/2023   CREATININE 1.30 (H) December 05, 2023   CALCIUM 9.1 09/18/2021   GFRNONAA >60 09/18/2021   GFRAA 89 10/24/2020   Lipid Panel: No results found for: "LDLCALC" HgbA1c:  Lab Results  Component Value Date   HGBA1C 5.6 10/24/2020   Urine Drug Screen: No results found for: "LABOPIA", "COCAINSCRNUR", "LABBENZ", "AMPHETMU", "THCU", "LABBARB"  Alcohol Level No results found for: "ETH" INR  Lab Results  Component Value Date   INR 2.0 06/12/2020   APTT  Lab Results  Component Value Date   APTT 39 (H) 02/10/2020   AED levels: No results found for: "PHENYTOIN", "ZONISAMIDE", "LAMOTRIGINE", "LEVETIRACETA"  CT Head without contrast(Personally reviewed): Negative  ASSESSMENT   Kellie Humphrey is a 78 y.o. female with transient branch retinal artery occlusion with visualized material in the artery  ophthalmology.  She will need workup for secondary risk factor modification as I would treat this as transient ischemic attack.  She takes a baby aspirin at home.  RECOMMENDATIONS  - HgbA1c, fasting lipid panel - MRI of the brain without contrast - Frequent neuro checks - Echocardiogram - Prophylactic therapy-Antiplatelet med: Aspirin - dose 81mg  and plavix 75mg  daily  after 300mg  load  - Risk factor modification - Telemetry monitoring - PT consult, OT consult, Speech consult - Stroke team to  follow  ______________________________________________________________________    Signed, Ritta Slot, MD [facility]

## 2023-11-21 NOTE — Code Documentation (Signed)
 Responded to Code Stroke called on pt who arrived in ED  from eye doctor at 0116 for L eye lower quadrant visual loss. Code Stroke called at 0130 for visual changes, [id number]. CBG-101, NIH-0, CT head negative for acute changes. TNK not given-visual deficit in L eye improving on assessment. Pt remains in TNK window until 0330. Please complete VS/neuro checks q69m until then. Once out of TNK window, please complete VS/neuro checks q2h x 12, then q4h.  Plan stroke workup.

## 2023-11-21 NOTE — Plan of Care (Signed)
 Received from ED and oriented to room and educated started on stroke.  Spouse at side and educated as well.    Problem: Education: Goal: Knowledge of disease or condition will improve Outcome: Progressing   Problem: Education: Goal: Knowledge of secondary prevention will improve (MUST DOCUMENT ALL) Outcome: Progressing   Problem: Education: Goal: Knowledge of patient specific risk factors will improve (DELETE if not current risk factor) Outcome: Progressing   Problem: Ischemic Stroke/TIA Tissue Perfusion: Goal: Complications of ischemic stroke/TIA will be minimized Outcome: Progressing   Problem: Coping: Goal: Will verbalize positive feelings about self Outcome: Progressing   Problem: Coping: Goal: Will identify appropriate support needs Outcome: Progressing

## 2023-11-21 NOTE — Assessment & Plan Note (Signed)
 Present on admission.  At this time no visual field defect with confrontation testing.  Patient visual field defect has resolved.  Patient has been started on aspirin and Plavix.  I will also start the patient on high intensity statin given her lipid values.  As well as retinal artery occlusion noted on fundus exam by ophthalmology.  Patient will be maintained on telemetry.  Echo pending.  Patient already on regular diet.  Appreciate neurology help as well.

## 2023-11-21 NOTE — ED Triage Notes (Signed)
 The pt was sent from her eye doctors office  she had a cut in the visual field of her lt eye at 2300 she had no vision from the middle of her lt eye down after  her eye was examined she reports that her vision in the lt eye improved no headache no other symptoms at present a and o x 4    she walked into the ed with her husband

## 2023-11-21 NOTE — Progress Notes (Signed)
 PT Cancellation Note  Patient Details Name: Kellie Humphrey MRN: [id number] DOB: May 25, 1946   Cancelled Treatment:    Reason Eval/Treat Not Completed: PT screened, no needs identified, will sign off (Pt reports having no difficulty with balance or mobility. Pt has ambulated since being in the hospital and does not feel that she needs acute PT services. Please re-consult if any changes to POC.)  Hilton Cork, PT, DPT Secure Chat Preferred  Rehab Office [phone]  Arturo Morton Brion Aliment 11/21/2023, 2:06 PM

## 2023-11-21 NOTE — Progress Notes (Signed)
  Carryover admission to the Day Admitter.  I discussed this case with the EDP, Dr. Manus Gunning.  Per these discussions:   This is a 78 year old female who is being admitted for suspected TIA after presenting with acute onset of diminished vision in the lower visual field involving the left eye, which started abruptly at 2300 on 11/20/23 (serving as last known well).  Even though this is acute visual defect occurred at 2300 on a Friday night, she was able to get in touch with her ophthalmologist, who was concerned for retinal artery occlusion, and recommended the patient to present to the emergency department for further evaluation management thereof.  She arrived within the TNK window, but her visual field defect spontaneously improved, and is now almost entirely resolved.  CT head showed no evidence of acute process, CTA head and neck showed no evidence of LVO.  EDP discussed patient's case with on-call neurology, Dr. Amada Jupiter, who will formally consult, and recommends admission to the hospitalist service for further evaluation of suspected TIA.  He recommends pursuit of dual antiplatelet therapy.   I have placed an order for observation to med/tele for further evaluation management of the above.  I have placed some additional preliminary admit orders via the adult multi-morbid admission order set. I have placed orders for BMP focused TIA/ischemic stroke order set, including PT/OT/ST consults as well as lipid panel/hemoglobin A1c level.  Of also ordered echocardiogram without bubble study for the morning as well as MRI brain.  In the setting of heart rates in the 60s, ordered prn IV hydralazine for systolic blood pressure greater than 220 or diastolic blood pressure greater than 110 mmHg. She also has orders for DAP with daily baby aspirin and Plavix 75 mg p.o. daily, as recommended by neurology.    Newton Pigg, DO Hospitalist

## 2023-11-21 NOTE — Assessment & Plan Note (Signed)
 This is felt to represent CKD, I do not have any baseline creatinine recently.  I will check a urinalysis and give IV fluids given that patient get contrast for her CAT scan angiography.

## 2023-11-21 NOTE — Progress Notes (Addendum)
 STROKE TEAM PROGRESS NOTE   INTERIM HISTORY/SUBJECTIVE Seen in room. Neuro exam stable and hemodynamically stable.    OBJECTIVE  CBC    Component Value Date/Time   WBC 7.8 11/21/2023 0135   RBC 4.74 11/21/2023 0135   HGB 15.3 (H) 11/21/2023 0139   HGB 13.8 10/24/2020 1608   HGB 12.7 12/10/2016 1100   HCT 45.0 11/21/2023 0139   HCT 40.8 10/24/2020 1608   HCT 38.1 12/10/2016 1100   PLT 266 11/21/2023 0135   PLT 249 10/24/2020 1608   MCV 90.7 11/21/2023 0135   MCV 91 10/24/2020 1608   MCV 89.4 12/10/2016 1100   MCH 29.7 11/21/2023 0135   MCHC 32.8 11/21/2023 0135   RDW 14.1 11/21/2023 0135   RDW 13.5 10/24/2020 1608   RDW 13.8 12/10/2016 1100   LYMPHSABS 1.1 11/21/2023 0135   LYMPHSABS 1.1 10/24/2020 1608   LYMPHSABS 1.0 12/10/2016 1100   MONOABS 0.8 11/21/2023 0135   MONOABS 0.4 12/10/2016 1100   EOSABS 0.2 11/21/2023 0135   EOSABS 0.2 10/24/2020 1608   BASOSABS 0.0 11/21/2023 0135   BASOSABS 0.0 10/24/2020 1608   BASOSABS 0.0 12/10/2016 1100    BMET    Component Value Date/Time   NA 139 11/21/2023 0139   NA 141 10/24/2020 1608   NA 143 12/10/2016 1100   K 3.9 11/21/2023 0139   K 4.1 12/10/2016 1100   CL 104 11/21/2023 0139   CL 108 (H) 02/01/2013 0953   CO2 23 11/21/2023 0135   CO2 28 12/10/2016 1100   GLUCOSE 101 (H) 11/21/2023 0139   GLUCOSE 84 12/10/2016 1100   GLUCOSE 100 (H) 02/01/2013 0953   BUN 20 11/21/2023 0139   BUN 10 10/24/2020 1608   BUN 8.6 12/10/2016 1100   CREATININE 1.30 (H) 11/21/2023 0139   CREATININE 0.8 12/10/2016 1100   CALCIUM 9.6 11/21/2023 0135   CALCIUM 9.0 12/10/2016 1100   EGFR 71 (L) 12/10/2016 1100   GFRNONAA 48 (L) 11/21/2023 0135    IMAGING past 24 hours MR BRAIN WO CONTRAST Result Date: 11/21/2023 CLINICAL DATA:  78 year old female code stroke presentation. Abnormal vision. EXAM: MRI HEAD WITHOUT CONTRAST TECHNIQUE: Multiplanar, multiecho pulse sequences of the brain and surrounding structures were obtained without  intravenous contrast. COMPARISON:  Head CT 0143 hours today, CTA head and neck 0150 hours. FINDINGS: Brain: No restricted diffusion to suggest acute infarction. No midline shift, mass effect, evidence of mass lesion, ventriculomegaly, extra-axial collection or acute intracranial hemorrhage. Cervicomedullary junction and pituitary are within normal limits. Cerebral volume is within normal limits for age. Mild for age nonspecific T2/FLAIR heterogeneity in the cerebral white matter and the pons. However, on SWI there are multiple chronic microhemorrhages in the brainstem (series 11, image 30), and a small number of microhemorrhages scattered in both cerebral hemispheres. The extent is less than expected for amyloid angiopathy. No cortical encephalomalacia is identified. Vascular: Major intracranial vascular flow voids are preserved. Skull and upper cervical spine: Negative. Visualized bone marrow signal is within normal limits. Sinuses/Orbits: Normal suprasellar cistern. Normal optic chiasm. Noncontrast cavernous sinus appears symmetric and unremarkable. Postoperative changes to both globes, otherwise negative orbits. Paranasal Visualized paranasal sinuses and mastoids are stable and well aerated. Other: Grossly normal visible internal auditory structures. Negative visible scalp and face. IMPRESSION: 1. No acute intracranial abnormality. 2. Chronic small vessel disease, primarily in the form of chronic microhemorrhages concentrated in the brainstem, mildly scattered in the cerebral hemispheres. The findings do not rise to the level of amyloid angiopathy.  Electronically Signed   By: Odessa Fleming M.D.   On: 11/21/2023 04:36   CT ANGIO HEAD NECK W WO CM (CODE STROKE) Result Date: 11/21/2023 CLINICAL DATA:  Acute neurologic deficit.  Visual field cut. EXAM: CT ANGIOGRAPHY HEAD AND NECK WITH AND WITHOUT CONTRAST TECHNIQUE: Multidetector CT imaging of the head and neck was performed using the standard protocol during bolus  administration of intravenous contrast. Multiplanar CT image reconstructions and MIPs were obtained to evaluate the vascular anatomy. Carotid stenosis measurements (when applicable) are obtained utilizing NASCET criteria, using the distal internal carotid diameter as the denominator. RADIATION DOSE REDUCTION: This exam was performed according to the departmental dose-optimization program which includes automated exposure control, adjustment of the mA and/or kV according to patient size and/or use of iterative reconstruction technique. CONTRAST:  75mL OMNIPAQUE IOHEXOL 350 MG/ML SOLN COMPARISON:  None Available. FINDINGS: Delete CTA NECK FINDINGS Skeleton: No acute abnormality or high grade bony spinal canal stenosis. C5-6 ACDF. Other neck: Normal pharynx, larynx and major salivary glands. No cervical lymphadenopathy. Unremarkable thyroid gland. Upper chest: No pneumothorax or pleural effusion. No nodules or masses. Aortic arch: There is minimal calcific atherosclerosis of the aortic arch. Conventional 3 vessel aortic branching pattern. RIGHT carotid system: No dissection, occlusion or aneurysm. Mild atherosclerotic calcification at the carotid bifurcation without hemodynamically significant stenosis. LEFT carotid system: Normal without aneurysm, dissection or stenosis. Vertebral arteries: Codominant configuration. There is no dissection, occlusion or flow-limiting stenosis to the skull base (V1-V3 segments). CTA HEAD FINDINGS POSTERIOR CIRCULATION: Vertebral arteries are normal. No proximal occlusion of the anterior or inferior cerebellar arteries. Basilar artery is normal. Superior cerebellar arteries are normal. Posterior cerebral arteries are normal. ANTERIOR CIRCULATION: Intracranial internal carotid arteries are normal. Anterior cerebral arteries are normal. Middle cerebral arteries are normal. Venous sinuses: As permitted by contrast timing, patent. Anatomic variants: None Review of the MIP images confirms the  above findings. IMPRESSION: 1. No emergent large vessel occlusion or hemodynamically significant stenosis of the head or neck. 2. Mild right carotid bifurcation atherosclerosis without hemodynamically significant stenosis. Electronically Signed   By: Deatra Robinson M.D.   On: 11/21/2023 01:59   CT HEAD CODE STROKE WO CONTRAST Result Date: 11/21/2023 CLINICAL DATA:  Code stroke.  Visual field change EXAM: CT HEAD WITHOUT CONTRAST TECHNIQUE: Contiguous axial images were obtained from the base of the skull through the vertex without intravenous contrast. RADIATION DOSE REDUCTION: This exam was performed according to the departmental dose-optimization program which includes automated exposure control, adjustment of the mA and/or kV according to patient size and/or use of iterative reconstruction technique. COMPARISON:  None Available. FINDINGS: Brain: There is no mass, hemorrhage or extra-axial collection. The size and configuration of the ventricles and extra-axial CSF spaces are normal. There is hypoattenuation of the periventricular white matter, most commonly indicating chronic ischemic microangiopathy. Vascular: No abnormal hyperdensity of the major intracranial arteries or dural venous sinuses. No intracranial atherosclerosis. Skull: The visualized skull base, calvarium and extracranial soft tissues are normal. Sinuses/Orbits: No fluid levels or advanced mucosal thickening of the visualized paranasal sinuses. No mastoid or middle ear effusion. The orbits are normal. ASPECTS [facility] Stroke Program Early CT Score) - Ganglionic level infarction (caudate, lentiform nuclei, internal capsule, insula, M1-M3 cortex): 7 - Supraganglionic infarction (M4-M6 cortex): 3 Total score (0-10 with 10 being normal): 10 IMPRESSION: 1. No acute intracranial abnormality. 2. ASPECTS is 10. 3. Chronic ischemic microangiopathy. These results were communicated to Dr. Ritta Slot at 1:54 am on 11/21/2023 by text page  via the [employer] system. Electronically Signed   By: Deatra Robinson M.D.   On: 11/21/2023 01:55    Vitals:   11/21/23 1145 11/21/23 1215 11/21/23 1400 11/21/23 1436  BP: 106/77 121/76 113/73 131/88  Pulse: (!) 57 60 61 69  Resp: 15 13 13 17   Temp:    98.2 F (36.8 C)  TempSrc:    Oral  SpO2: 99% 96% 98% 100%  Weight:      Height:         PHYSICAL EXAM General:  Alert, well-nourished, well-developed patient in no acute distress Psych:  Mood and affect appropriate for situation CV: Regular rate and rhythm on monitor Respiratory:  Regular, unlabored respirations on room air GI: Abdomen soft and nontender   NEURO:  Mental Status: AA&Ox3, patient is able to give clear and coherent history Speech/Language: speech is without dysarthria or aphasia.  Naming, repetition, fluency, and comprehension intact.  Cranial Nerves:  II: PERRL. Visual fields full.  III, IV, VI: EOMI. Eyelids elevate symmetrically.  V: Sensation is intact to light touch and symmetrical to face.  VII: Face is symmetrical resting and smiling VIII: hearing intact to voice. IX, X: Palate elevates symmetrically. Phonation is normal.  ZH:[id number] shrug 5/5. XII: tongue is midline without fasciculations. Motor: 5/5 strength to all muscle groups tested.  Tone: is normal and bulk is normal Sensation- Intact to light touch bilaterally. Extinction absent to light touch to DSS.   Coordination: FTN intact bilaterally, HKS: no ataxia in BLE.No drift.  Gait- deferred  Most Recent NIH 0   ASSESSMENT/PLAN  [name] is a 78 y.o. female with history of PACs, hypothyroidism who presents with visual changes in the left eye. She states that she was sitting on the couch at 11 PM, and then after standing up, she noticed that her left vision changed. She called an ophthalmologist (Dr. Burgess Estelle) who arranged to see her in clinic emergently.  NIH on Admission 0. Only reporting dizziness on arrival.   CRAO vs TIA Code Stroke  CT head No acute abnormality. Small vessel disease. ASPECTS 10.    CTA head & neck No emergent large vessel occlusion or hemodynamically significant stenosis of the head or neck. Mild right carotid bifurcation atherosclerosis without hemodynamically significant stenosis. MRI  No acute intracranial abnormality.  2D Echo Pending  LDL 109 HgbA1c 5.5 VTE prophylaxis - lovenox Aspirin 81mg  prior to admission, now on aspirin 81 mg daily and clopidogrel 75 mg daily for 3 weeks and then Plavix 75mg  alone. Therapy recommendations:  No follow up needed  Disposition:  Home after completion of work up   PACs Home Meds: Flecainide, lopressor  Stable Blood Pressure Goal: BP less than 220/110   Hyperlipidemia LDL 109, goal < 70 Add atorvastatin   Continue statin at discharge  Other Active Problems Hypothyroidism   Hospital day # 0  Patient seen and examined by NP/APP with MD. MD to update note as needed.   Gevena Mart DNP, ACNPC-AG  [facility]   I, the attending neurologist, have personally obtained a history, examined the patient, evaluated laboratory data, individually viewed imaging studies and agree with radiology interpretations. I obtained additional history from pt's at bedside. Together with the NP/PA, we formulated the assessment and plan of care which reflects our mutual decision.  I have made any additions or clarifications directly to the above note and agree with the findings and plan as currently documented. Will sign off, please call for any questions.  I spent a total of 35 minutes dedicated to the care of this patient.     Windell Norfolk, MD Neurology 11/21/2023 6:01 PM       To contact Stroke Continuity provider, please refer to [email or web address]. After hours, contact General Neurology

## 2023-11-21 NOTE — Assessment & Plan Note (Signed)
 This is chronic, patient uses flecainide and metoprolol for this.  Patient's last dose of flecainide was last evening.  I will hold the metoprolol for today.  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  Consider restarting tomorrow morning.

## 2023-11-21 NOTE — Progress Notes (Signed)
 OT Cancellation Note  Patient Details Name: Kellie Humphrey MRN: [id number] DOB: [birth year]   Cancelled Treatment:    Reason Eval/Treat Not Completed: OT screened, no needs identified, will sign off (pt reports vision is back to baseline, no acute OT concerns, will screen, please reconsult if there is a change in pt status)  Carver Fila, OTD, OTR/L [facility]erred Acute Rehab [phone]   Carver Fila Koonce 11/21/2023, 2:42 PM

## 2023-11-21 NOTE — ED Provider Notes (Signed)
 [facility] AT [facility] Provider Note   CSN: [id number] Arrival date & time: 11/21/23  0116     History  Chief Complaint  Patient presents with   Visual Field Change    Kellie Humphrey is a 78 y.o. female.  Patient sent by Dr. Burgess Estelle of ophthalmology with concern for retinal artery occlusion.  She has sudden onset visual loss to her left eye about 11 PM.  Could not see the lower half of her eye.  This lasted about 1 hour but now she is starting to see some gray blurriness and not black.  Seen by Dr. Burgess Estelle who thought she had a partial branch retinal artery occlusion with some plaque in the artery but not completely occluding blood flow.  He recommended a stroke workup and evaluation.  Patient denies any other symptoms.  She has no headache.  She has no weakness, numbness or tingling.  No difficulty speaking or difficulty swallowing.  No chest pain.  No headache.  She has a history of "irregular heartbeat" but not atrial fibrillation, she is not on any blood thinners.  Does have a history of thyroids and cataracts. States her vision went out as soon as she stood up from a sitting position.  The history is provided by the patient.       Home Medications Prior to Admission medications   Medication Sig Start Date End Date Taking? Authorizing Provider  acetaminophen (TYLENOL) 500 MG tablet Take 500-1,000 mg by mouth See admin instructions. Take 500 mg at night, may take a second 500 -1000 mg dose as needed for pain    [provider]  aspirin EC 81 MG tablet Take 1 tablet (81 mg total) by mouth daily. Swallow whole. 07/04/20   Lars Masson, MD  betamethasone dipropionate 0.05 % cream Apply 1 application  topically as needed. 10/29/21   [provider]  Biotin 5000 MCG CAPS Take 5,000 mcg by mouth daily.    [provider]  Cholecalciferol (VITAMIN D) 2000 units tablet Take 2,000 Units by mouth daily.    [provider]  Cyanocobalamin (B-12) 5000 MCG CAPS Take 5,000 mcg by mouth once a week. Mondays    [provider]  flecainide (TAMBOCOR) 50 MG tablet Take two tablets (100 mg) by mouth every morning and one and half tablets (75 mg) every evening 10/15/23   Marinus Maw, MD  gabapentin (NEURONTIN) 600 MG tablet Take 600 mg by mouth 2 (two) times daily.    [provider]  levothyroxine (SYNTHROID) 50 MCG tablet Take 1 tablet (50 mcg total) by mouth daily before breakfast. 11/13/15   Magrinat, Valentino Hue, MD  melatonin 5 MG TABS Take 5 mg by mouth at bedtime.    [provider]  metoprolol tartrate (LOPRESSOR) 25 MG tablet Take 1 tablet (25 mg total) by mouth 2 (two) times daily. 05/20/23   Sharlene Dory, PA-C  metroNIDAZOLE (METROCREAM) 0.75 % cream Apply 1 Application topically as needed.    [provider]  omeprazole (PRILOSEC) 40 MG capsule TAKE 1 CAPSULE BY MOUTH TWICE A DAY 06/11/23   Doree Albee, PA-C  primidone (MYSOLINE) 50 MG tablet Take 50 mg by mouth at bedtime.    [provider]  RESTASIS 0.05 % ophthalmic emulsion Place 1 drop into the left eye 2 (two) times daily. 07/27/23   [provider]  sucralfate (CARAFATE) 1 g tablet TAKE 1 TABLET (1 G TOTAL) BY  MOUTH 4 TIMES A DAY WITH MEALS AND AT BEDTIME 07/09/23   Quentin Mulling R, PA-C  SYSTANE ULTRA 0.4-0.3 % SOLN Place 1 drop into the left eye every 4 (four) hours as needed (dry/irritated eyes. (4-6 times daily)).  12/19/19   [provider]      Allergies    Codeine, Phenergan [promethazine], Betadine [povidone iodine], and Oxycodone    Review of Systems   Review of Systems  Constitutional:  Negative for activity change, appetite change and fever.  HENT:  Negative for congestion and rhinorrhea.   Eyes:  Positive for visual disturbance.  Respiratory:  Negative for cough, chest tightness and shortness of breath.   Cardiovascular:  Negative for chest pain.   Gastrointestinal:  Negative for abdominal pain and nausea.  Genitourinary:  Negative for dysuria and hematuria.  Musculoskeletal:  Negative for arthralgias and myalgias.  Skin:  Negative for rash.  Neurological:  Negative for dizziness, weakness and headaches.   all other systems are negative except as noted in the HPI and PMH.    Physical Exam Updated Vital Signs BP (!) 145/106 (BP Location: Right Arm)   Pulse 68   Temp 98.2 F (36.8 C) (Oral)   Resp 18   Ht 5\' 7"  (1.702 m)   Wt 60.3 kg   SpO2 100%   BMI 20.82 kg/m  Physical Exam Vitals and nursing note reviewed.  Constitutional:      General: She is not in acute distress.    Appearance: She is well-developed.  HENT:     Head: Normocephalic and atraumatic.     Mouth/Throat:     Pharynx: No oropharyngeal exudate.  Eyes:     Conjunctiva/sclera: Conjunctivae normal.     Pupils: Pupils are equal, round, and reactive to light.     Comments: Pupils pharmacologically dilate  Neck:     Comments: No meningismus. Cardiovascular:     Rate and Rhythm: Normal rate and regular rhythm.     Heart sounds: Normal heart sounds. No murmur heard. Pulmonary:     Effort: Pulmonary effort is normal. No respiratory distress.     Breath sounds: Normal breath sounds.  Abdominal:     Palpations: Abdomen is soft.     Tenderness: There is no abdominal tenderness. There is no guarding or rebound.  Musculoskeletal:        General: No tenderness. Normal range of motion.     Cervical back: Normal range of motion and neck supple.  Skin:    General: Skin is warm.  Neurological:     Mental Status: She is alert and oriented to person, place, and time.     Cranial Nerves: No cranial nerve deficit.     Motor: No abnormal muscle tone.     Coordination: Coordination normal.     Comments:  5/5 strength throughout. CN 2-12 intact.Equal grip strength.   Visual field cut left eye lower half of visual field.  Able to count fingers.  Psychiatric:         Behavior: Behavior normal.     ED Results / Procedures / Treatments   Labs (all labs ordered are listed, but only abnormal results are displayed) Labs Reviewed  COMPREHENSIVE METABOLIC PANEL - Abnormal; Notable for the following components:      Result Value   Glucose, Bld 104 (*)    Creatinine, Ser 1.18 (*)    GFR, Estimated 48 (*)    All other components within normal limits  I-STAT CHEM 8, ED -  Abnormal; Notable for the following components:   Creatinine, Ser 1.30 (*)    Glucose, Bld 101 (*)    Calcium, Ion 1.09 (*)    Hemoglobin 15.3 (*)    All other components within normal limits  ETHANOL  PROTIME-INR  APTT  CBC  DIFFERENTIAL  RAPID URINE DRUG SCREEN, HOSP PERFORMED  URINALYSIS, ROUTINE W REFLEX MICROSCOPIC  MAGNESIUM  LIPID PANEL  HEMOGLOBIN A1C    EKG None  Radiology CT ANGIO HEAD NECK W WO CM (CODE STROKE) Result Date: 11/21/2023 CLINICAL DATA:  Acute neurologic deficit.  Visual field cut. EXAM: CT ANGIOGRAPHY HEAD AND NECK WITH AND WITHOUT CONTRAST TECHNIQUE: Multidetector CT imaging of the head and neck was performed using the standard protocol during bolus administration of intravenous contrast. Multiplanar CT image reconstructions and MIPs were obtained to evaluate the vascular anatomy. Carotid stenosis measurements (when applicable) are obtained utilizing NASCET criteria, using the distal internal carotid diameter as the denominator. RADIATION DOSE REDUCTION: This exam was performed according to the departmental dose-optimization program which includes automated exposure control, adjustment of the mA and/or kV according to patient size and/or use of iterative reconstruction technique. CONTRAST:  75mL OMNIPAQUE IOHEXOL 350 MG/ML SOLN COMPARISON:  None Available. FINDINGS: Delete CTA NECK FINDINGS Skeleton: No acute abnormality or high grade bony spinal canal stenosis. C5-6 ACDF. Other neck: Normal pharynx, larynx and major salivary glands. No cervical  lymphadenopathy. Unremarkable thyroid gland. Upper chest: No pneumothorax or pleural effusion. No nodules or masses. Aortic arch: There is minimal calcific atherosclerosis of the aortic arch. Conventional 3 vessel aortic branching pattern. RIGHT carotid system: No dissection, occlusion or aneurysm. Mild atherosclerotic calcification at the carotid bifurcation without hemodynamically significant stenosis. LEFT carotid system: Normal without aneurysm, dissection or stenosis. Vertebral arteries: Codominant configuration. There is no dissection, occlusion or flow-limiting stenosis to the skull base (V1-V3 segments). CTA HEAD FINDINGS POSTERIOR CIRCULATION: Vertebral arteries are normal. No proximal occlusion of the anterior or inferior cerebellar arteries. Basilar artery is normal. Superior cerebellar arteries are normal. Posterior cerebral arteries are normal. ANTERIOR CIRCULATION: Intracranial internal carotid arteries are normal. Anterior cerebral arteries are normal. Middle cerebral arteries are normal. Venous sinuses: As permitted by contrast timing, patent. Anatomic variants: None Review of the MIP images confirms the above findings. IMPRESSION: 1. No emergent large vessel occlusion or hemodynamically significant stenosis of the head or neck. 2. Mild right carotid bifurcation atherosclerosis without hemodynamically significant stenosis. Electronically Signed   By: Deatra Robinson M.D.   On: 11/21/2023 01:59   CT HEAD CODE STROKE WO CONTRAST Result Date: 11/21/2023 CLINICAL DATA:  Code stroke.  Visual field change EXAM: CT HEAD WITHOUT CONTRAST TECHNIQUE: Contiguous axial images were obtained from the base of the skull through the vertex without intravenous contrast. RADIATION DOSE REDUCTION: This exam was performed according to the departmental dose-optimization program which includes automated exposure control, adjustment of the mA and/or kV according to patient size and/or use of iterative reconstruction  technique. COMPARISON:  None Available. FINDINGS: Brain: There is no mass, hemorrhage or extra-axial collection. The size and configuration of the ventricles and extra-axial CSF spaces are normal. There is hypoattenuation of the periventricular white matter, most commonly indicating chronic ischemic microangiopathy. Vascular: No abnormal hyperdensity of the major intracranial arteries or dural venous sinuses. No intracranial atherosclerosis. Skull: The visualized skull base, calvarium and extracranial soft tissues are normal. Sinuses/Orbits: No fluid levels or advanced mucosal thickening of the visualized paranasal sinuses. No mastoid or middle ear effusion. The orbits are normal. ASPECTS (  [place] Stroke Program Early CT Score) - Ganglionic level infarction (caudate, lentiform nuclei, internal capsule, insula, M1-M3 cortex): 7 - Supraganglionic infarction (M4-M6 cortex): 3 Total score (0-10 with 10 being normal): 10 IMPRESSION: 1. No acute intracranial abnormality. 2. ASPECTS is 10. 3. Chronic ischemic microangiopathy. These results were communicated to Dr. Ritta Slot at 1:54 am on 11/21/2023 by text page via the [facility] messaging system. Electronically Signed   By: Deatra Robinson M.D.   On: 11/21/2023 01:55    Procedures .Critical Care  Performed by: Glynn Octave, MD Authorized by: Glynn Octave, MD   Critical care provider statement:    Critical care time (minutes):  45   Critical care time was exclusive of:  Separately billable procedures and treating other patients   Critical care was necessary to treat or prevent imminent or life-threatening deterioration of the following conditions:  CNS failure or compromise   Critical care was time spent personally by me on the following activities:  Development of treatment plan with patient or surrogate, discussions with consultants, evaluation of patient's response to treatment, examination of patient, ordering and review of laboratory studies,  ordering and review of radiographic studies, ordering and performing treatments and interventions, pulse oximetry, re-evaluation of patient's condition, review of old charts, blood draw for specimens and obtaining history from patient or surrogate   I assumed direction of critical care for this patient from another provider in my specialty: no     Care discussed with: admitting provider       Medications Ordered in ED Medications - No data to display  ED Course/ Medical Decision Making/ A&P                                 Medical Decision Making Amount and/or Complexity of Data Reviewed Labs: ordered. Decision-making details documented in ED Course. Radiology: ordered and independent interpretation performed. Decision-making details documented in ED Course. ECG/medicine tests: ordered and independent interpretation performed. Decision-making details documented in ED Course.  Risk Prescription drug management. Decision regarding hospitalization.   Sudden onset visual field cut about 11 PM.  Already seen by ophthalmology who thought she had a partial branch retinal artery occlusion.  Code stroke activated on arrival.  Patient seen with Dr. Amada Jupiter of neurology.  She is starting to regain some vision in the lower half of her left eye.  Therefore he does not feel she is a candidate for TNK.  Recommend CTA and admission for stroke workup.  Patient states patient is "95% better".  Per discussion above with Dr. Amada Jupiter she is not a candidate for TNK.  CT negative for hemorrhage.  CTA negative for large vessel occlusion.  Will admit for remainder of stroke workup.  Neurology recommends aspirin and Plavix.  Will need admission for stroke workup.  Discussed with Dr. Arlean Hopping.        Final Clinical Impression(s) / ED Diagnoses Final diagnoses:  None    Rx / DC Orders ED Discharge Orders     None         Wilberth Damon, Jeannett Senior, MD 11/21/23 [phone]

## 2023-11-21 NOTE — ED Notes (Signed)
 Requested code stroke activation for patient per dr. Manus Gunning

## 2023-11-21 NOTE — Assessment & Plan Note (Signed)
 I note that this is documented in the chart from 4 years ago, no recent documentation in cardiology notes.  Maintain on telemetry.  Patient may need prolonged loop recorder at discharge.

## 2023-11-22 ENCOUNTER — Observation Stay (HOSPITAL_BASED_OUTPATIENT_CLINIC_OR_DEPARTMENT_OTHER): Payer: PPO

## 2023-11-22 DIAGNOSIS — H3410 Central retinal artery occlusion, unspecified eye: Secondary | ICD-10-CM

## 2023-11-22 DIAGNOSIS — I4891 Unspecified atrial fibrillation: Secondary | ICD-10-CM

## 2023-11-22 DIAGNOSIS — I9789 Other postprocedural complications and disorders of the circulatory system, not elsewhere classified: Secondary | ICD-10-CM | POA: Diagnosis not present

## 2023-11-22 DIAGNOSIS — H53122 Transient visual loss, left eye: Secondary | ICD-10-CM

## 2023-11-22 DIAGNOSIS — Z9889 Other specified postprocedural states: Secondary | ICD-10-CM

## 2023-11-22 DIAGNOSIS — H34232 Retinal artery branch occlusion, left eye: Secondary | ICD-10-CM | POA: Diagnosis not present

## 2023-11-22 DIAGNOSIS — R002 Palpitations: Secondary | ICD-10-CM

## 2023-11-22 LAB — ECHOCARDIOGRAM COMPLETE
Area-P 1/2: 2.37 cm2
Height: 67 in
S' Lateral: 3 cm
Weight: 2127 [oz_av]

## 2023-11-22 MED ORDER — APIXABAN 5 MG PO TABS: 5.0000 mg | ORAL_TABLET | Freq: Two times a day (BID) | ORAL | 3 refills | Status: DC

## 2023-11-22 MED ORDER — APIXABAN 5 MG PO TABS: 5.0000 mg | ORAL_TABLET | Freq: Two times a day (BID) | ORAL | Status: DC

## 2023-11-22 MED ORDER — ATORVASTATIN CALCIUM 80 MG PO TABS
80.0000 mg | ORAL_TABLET | Freq: Every day | ORAL | 3 refills | Status: DC
Start: 2023-11-23 — End: 2023-12-15

## 2023-11-22 NOTE — Consult Note (Addendum)
 Cardiology Consultation   Patient ID: Kellie Humphrey MRN: [id number]; DOB: 28-Jul-1946  Admit date: 11/21/2023 Date of Consult: 11/22/2023  PCP:  Cleatis Polka., MD   [facility] Providers Cardiologist:  Meriam Sprague, MD (Inactive)  Electrophysiologist:  Lewayne Bunting, MD       Patient Profile:   Kellie Humphrey is a 78 y.o. female with a hx of MVP with severe MR s/p MVR 01/2020, PACs, anxiety, atrial fibrillation, hypothyroidism who is being seen 11/22/2023 for the evaluation of possible atrial fibrillation at the request of Dr. Sharl Ma.  History of Present Illness:   [name] has a past medical history of MVP with severe MR s/p MVR 01/2020, PACs, anxiety, atrial fibrillation, hypothyroidism.   She developed A. Fib after her MVR in 01/2020. She underwent a cardioversion, IV amiodarone, sent home on PO amiodarone. Since then had discontinued amiodarone and was on flecainide. She wore a Zio monitor 02/2020 which showed no atrial fibrillation with occasional PACs/PVCs.   She was last seen by Dr. Ladona Ridgel 09/24/2023 where her medication regimen to treat her palpitations was flecainide 100 mg in AM 75 mg in PM and Lopressor 25 mg BID. At this visit she was not currently on any DOAC.  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 Patient was started on ASA and Plavix along with Lipitor 80 mg. Per documentation primary team discusses potential need for loop recorder at discharge to determine if patient is in A. Fib.   After speaking with patient, she agrees with the history as stated above. She adds that for a while they have been unable to determine if she is actually in atrial fibrillation or just having frequent PACs. She states that most of her prior monitors have been uneventful.   Past Medical History:  Diagnosis Date   Anxiety disorder    Arthritis    Breast cancer (HCC) 07/25/2008    Cancer (HCC)    Cataract    Essential tremor    /familial   Heart murmur 2021   corrected with valve surgery   Hypothyroidism    Interstitial cystitis    Neuropathy    Nonrheumatic mitral valve regurgitation    PAC (premature atrial contraction)    Paresthesias 07/29/2014   Personal history of radiation therapy    PREMATURE ATRIAL CONTRACTIONS 05/08/2009   Qualifier: Diagnosis of  By: Trevor Iha, RN, [name]     Radiculopathy, cervical region    RLS (restless legs syndrome)    S/P minimally-invasive mitral valve repair 02/10/2020   Complex valvuloplasty including artificial Gore-tex neochord placement x8 with edge-to-edge suture plication of anterior commissure and 32 mm [name] 4D ring annuloplasty via right mini thoracotomy approach   Thyroid disease    Varicose veins of right lower extremity with complications 03/25/2016   Past Surgical History:  Procedure Laterality Date   bilateral cataract repair  july/aug 2021   Dr Darel Hong   BREAST BIOPSY  2009   BREAST LUMPECTOMY  07/25/2008   CARDIAC CATHETERIZATION  2021   DILATION AND CURETTAGE OF UTERUS     age 12   EYE SURGERY Left    Pterygium   HEMANGIOMA EXCISION Left 1990   arm   MELANOMA EXCISION Right 1980   thigh   MITRAL VALVE REPAIR Right 02/10/2020   Procedure: MINIMALLY INVASIVE MITRAL VALVE REPAIR (MVR) using Memo 4D 32 MM Mitral Valve Ring.;  Surgeon:  Purcell Nails, MD;  Location: [facility] OR;  Service: Open Heart Surgery;  Laterality: Right;   NECK SURGERY  10/2009   C5-6 fusion   RIGHT/LEFT HEART CATH AND CORONARY ANGIOGRAPHY N/A 01/04/2020   Procedure: RIGHT/LEFT HEART CATH AND CORONARY ANGIOGRAPHY;  Surgeon: Kathleene Hazel, MD;  Location: [facility] INVASIVE CV LAB;  Service: Cardiovascular;  Laterality: N/A;   ROBOTIC ASSISTED BILATERAL SALPINGO OOPHERECTOMY Bilateral 10/01/2021   Procedure: XI ROBOTIC ASSISTED BILATERAL SALPINGO OOPHORECTOMY;  Surgeon: Carver Fila, MD;  Location: [facility] ORS;  Service:  Gynecology;  Laterality: Bilateral;   SHOULDER ARTHROSCOPY Left 2016   spider vein treatment     TEE WITHOUT CARDIOVERSION N/A 06/30/2018   Procedure: TRANSESOPHAGEAL ECHOCARDIOGRAM (TEE);  Surgeon: Quintella Reichert, MD;  Location: [facility] ENDOSCOPY;  Service: Cardiovascular;  Laterality: N/A;   TEE WITHOUT CARDIOVERSION N/A 12/12/2019   Procedure: TRANSESOPHAGEAL ECHOCARDIOGRAM (TEE);  Surgeon: Lars Masson, MD;  Location: [facility] ENDOSCOPY;  Service: Cardiovascular;  Laterality: N/A;   TEE WITHOUT CARDIOVERSION N/A 02/10/2020   Procedure: TRANSESOPHAGEAL ECHOCARDIOGRAM (TEE);  Surgeon: Purcell Nails, MD;  Location: [facility] OR;  Service: Open Heart Surgery;  Laterality: N/A;   TONSILLECTOMY     as a child   uterine fibroids removed  2012    Home Medications:  Prior to Admission medications   Medication Sig Start Date End Date Taking? Authorizing Provider  acetaminophen (TYLENOL) 500 MG tablet Take 500-1,000 mg by mouth as needed for mild pain (pain score 1-3).   Yes [provider]  aspirin EC 81 MG tablet Take 1 tablet (81 mg total) by mouth daily. Swallow whole. 07/04/20  Yes Lars Masson, MD  Biotin 5000 MCG CAPS Take 5,000 mcg by mouth daily.   Yes [provider]  Cholecalciferol (VITAMIN D) 2000 units tablet Take 2,000 Units by mouth daily.   Yes [provider]  Cyanocobalamin (B-12) 5000 MCG CAPS Take 5,000 mcg by mouth once a week. Mondays   Yes [provider]  flecainide (TAMBOCOR) 50 MG tablet Take two tablets (100 mg) by mouth every morning and one and half tablets (75 mg) every evening 10/15/23  Yes Marinus Maw, MD  gabapentin (NEURONTIN) 600 MG tablet Take 600 mg by mouth 2 (two) times daily.   Yes [provider]  levothyroxine (SYNTHROID) 50 MCG tablet Take 1 tablet (50 mcg total) by mouth daily before breakfast. 11/13/15  Yes Magrinat, Valentino Hue, MD  melatonin 5 MG TABS Take 5 mg by mouth at bedtime.   Yes [provider]   metoprolol tartrate (LOPRESSOR) 25 MG tablet Take 1 tablet (25 mg total) by mouth 2 (two) times daily. 05/20/23  Yes Conte, Tessa N, PA-C  metroNIDAZOLE (METROCREAM) 0.75 % cream Apply 1 Application topically as needed.   Yes [provider]  primidone (MYSOLINE) 50 MG tablet Take 50 mg by mouth at bedtime.   Yes [provider]  SYSTANE ULTRA 0.4-0.3 % SOLN Place 1 drop into the left eye every 4 (four) hours as needed (dry/irritated eyes. (4-6 times daily)).  12/19/19  Yes [provider]   Inpatient Medications: Scheduled Meds:   stroke: early stages of recovery book   Does not apply Once   aspirin  81 mg Oral Daily   atorvastatin  80 mg Oral Daily   clopidogrel  75 mg Oral Daily   flecainide  100 mg Oral q AM   flecainide  75 mg Oral QHS   gabapentin  600 mg  Oral BID   levothyroxine  50 mcg Oral QAC breakfast   primidone  50 mg Oral QHS   Continuous Infusions:  PRN Meds: acetaminophen **OR** acetaminophen, hydrALAZINE, melatonin, ondansetron (ZOFRAN) IV  Allergies:    Allergies  Allergen Reactions   Codeine Nausea And Vomiting   Phenergan [Promethazine] Other (See Comments)    Long QT   Betadine [Povidone Iodine] Other (See Comments)    Irritation - vaginal   Oxycodone Nausea And Vomiting   Social History:   Social History   Socioeconomic History   Marital status: Married    Spouse name: Onalee Hua   Number of children: 2   Years of education: Bachelor   Highest education level: Not on file  Occupational History   Not on file  Tobacco Use   Smoking status: Never   Smokeless tobacco: Never  Vaping Use   Vaping status: Never Used  Substance and Sexual Activity   Alcohol use: Yes    Comment: Socially   Drug use: No   Sexual activity: Yes    Birth control/protection: Post-menopausal  Other Topics Concern   Not on file  Social History Narrative   Patient is married to Berry, has 2 children   Patient is right handed   Education level is  Bachelor   Caffeine consumption is none other than in chocolate occasionally       Social Drivers of Corporate investment banker Strain: Not on file  Food Insecurity: No Food Insecurity (11/21/2023)   Hunger Vital Sign    Worried About Running Out of Food in the Last Year: Never true    Ran Out of Food in the Last Year: Never true  Transportation Needs: No Transportation Needs (11/21/2023)   PRAPARE - Transportation    Lack of Transportation (Medical): No    Lack of Transportation (Non-Medical): No  Physical Activity: Not on file  Stress: Not on file  Social Connections: Socially Integrated (11/21/2023)   Social Connection and Isolation Panel [NHANES]    Frequency of Communication with Friends and Family: More than three times a week    Frequency of Social Gatherings with Friends and Family: More than three times a week    Attends Religious Services: 1 to 4 times per year    Active Member of [employer] or Organizations: Yes    Attends Engineer, structural: More than 4 times per year    Marital Status: Married  Catering manager Violence: Not At Risk (11/21/2023)   Humiliation, Afraid, Rape, and Kick questionnaire    Fear of Current or Ex-Partner: No    Emotionally Abused: No    Physically Abused: No    Sexually Abused: No    Family History:   Family History  Problem Relation Age of Onset   Coronary artery disease Mother        s/p MI (30s)   Hypertension Mother    Osteoporosis Mother        wrist fracture   Alzheimer's disease Mother    Osteoarthritis Father    Neuropathy Father    Tremor Father    Other Father        lymphedema   Fibromyalgia Sister    Heart disease Maternal Grandmother    Colon polyps Neg Hx    Esophageal cancer Neg Hx    Pancreatic cancer Neg Hx    Stomach cancer Neg Hx    Rectal cancer Neg Hx    Ovarian cancer Neg Hx    Breast  cancer Neg Hx    Colon cancer Neg Hx    Prostate cancer Neg Hx    Endometrial cancer Neg Hx    BRCA 1/2 Neg Hx      ROS:  Please see the history of present illness.  All other ROS reviewed and negative.     Physical Exam/Data:   Vitals:   11/22/23 0801 11/22/23 1145 11/22/23 1147 11/22/23 1600  BP: 116/74 (!) 89/61 99/66 122/76  Pulse: 70 71 71 85  Resp: 18 18  18   Temp: 98.1 F (36.7 C) (!) 97.5 F (36.4 C)  97.8 F (36.6 C)  TempSrc: Oral Oral  Oral  SpO2: 100% 97%  97%  Weight:      Height:       No intake or output data in the 24 hours ending 11/22/23 1705    11/22/2023    5:08 AM 11/21/2023    1:35 AM 10/15/2023   12:25 PM  Last 3 Weights  Weight (lbs) 132 lb 15 oz 132 lb 15 oz 133 lb  Weight (kg) 60.3 kg 60.3 kg 60.328 kg     Body mass index is 20.82 kg/m.  General:  Well nourished, well developed, in no acute distress HEENT: normal Neck: no JVD Vascular: No carotid bruits; Distal pulses 2+ bilaterally Cardiac:  normal S1, S2; RRR; no murmur  Lungs:  clear to auscultation bilaterally, no wheezing, rhonchi or rales  Abd: soft, nontender, no hepatomegaly  Ext: no edema Musculoskeletal:  No deformities, BUE and BLE strength normal and equal Skin: warm and dry  Neuro:  CNs 2-12 intact, no focal abnormalities noted Psych:  Normal affect   EKG:  The EKG was personally reviewed and demonstrates:  sinus rhythm HR 67  Telemetry:  Telemetry was personally reviewed and demonstrates:  sinus rhythm, frequent PACs, HR 70-80s  Relevant CV Studies: Echocardiogram 11/22/2023 IMPRESSIONS   1. Left ventricular ejection fraction, by estimation, is 55 to 60%. The  left ventricle has normal function. The left ventricle has no regional  wall motion abnormalities. Left ventricular diastolic function could not  be evaluated.   2. Right ventricular systolic function is normal. The right ventricular  size is normal. There is normal pulmonary artery systolic pressure. The  estimated right ventricular systolic pressure is 31.5 mmHg.   3. The mitral valve has been repaired/replaced. No evidence of  mitral  valve regurgitation. The mean mitral valve gradient is 6.0 mmHg with  average heart rate of 82 bpm. There is a 32 mm [name] prosthetic  annuloplasty ring present in the mitral  position. Procedure Date: 02/10/20.   4. The aortic valve is tricuspid. There is mild calcification of the  aortic valve. Aortic valve regurgitation is not visualized. No aortic  stenosis is present.   RHC/LHC 01/04/2020 1. No angiographic evidence of CAD 2. Normal filling pressures.  3. Severe mitral regurgitation by echo 4. PA sat 83% x 2, SVC sat 87%.    Continue with planning for mitral valve surgery.   Laboratory Data:  High Sensitivity Troponin:  No results for input(s): "TROPONINIHS" in the last 720 hours.   Chemistry Recent Labs  Lab 11/21/23 0135 11/21/23 0139  NA 139 139  K 4.0 3.9  CL 103 104  CO2 23  --   GLUCOSE 104* 101*  BUN 17 20  CREATININE 1.18* 1.30*  CALCIUM 9.6  --   MG 2.2  --   GFRNONAA 48*  --   ANIONGAP 13  --  Recent Labs  Lab 11/21/23 0135  PROT 7.2  ALBUMIN 4.2  AST 27  ALT 20  ALKPHOS 97  BILITOT 0.6   Lipids  Recent Labs  Lab 11/21/23 0135  CHOL 203*  TRIG 129  HDL 68  LDLCALC 109*  CHOLHDL 3.0    Hematology Recent Labs  Lab 11/21/23 0135 11/21/23 0139  WBC 7.8  --   RBC 4.74  --   HGB 14.1 15.3*  HCT 43.0 45.0  MCV 90.7  --   MCH 29.7  --   MCHC 32.8  --   RDW 14.1  --   PLT 266  --    Thyroid No results for input(s): "TSH", "FREET4" in the last 168 hours.  BNPNo results for input(s): "BNP", "PROBNP" in the last 168 hours.  DDimer No results for input(s): "DDIMER" in the last 168 hours.   Radiology/Studies:  ECHOCARDIOGRAM COMPLETE Result Date: 11/22/2023    ECHOCARDIOGRAM REPORT   Patient Name:   DAMESHA LAWLER [facility] Date of Exam: 11/22/2023 Medical Rec #:  [id number]          Height:       67.0 in Accession #:    [id number]         Weight:       132.9 lb Date of Birth:  10/03/1945          BSA:          1.700 m Patient Age:     77 years           BP:           99/66 mmHg Patient Gender: F                  HR:           80 bpm. Exam Location:  Inpatient Procedure: 2D Echo (Both Spectral and Color Flow Doppler were utilized during            procedure). Indications:    stroke  History:        Patient has prior history of Echocardiogram examinations, most                 recent 03/19/2023. Arrythmias:Atrial Fibrillation.                  Mitral Valve: 32 mm [name] prosthetic annuloplasty ring                 valve is present in the mitral position. Procedure Date:                 02/10/20.  Sonographer:    Delcie Roch RDCS Referring Phys: [id number] JUSTIN B HOWERTER IMPRESSIONS  1. Left ventricular ejection fraction, by estimation, is 55 to 60%. The left ventricle has normal function. The left ventricle has no regional wall motion abnormalities. Left ventricular diastolic function could not be evaluated.  2. Right ventricular systolic function is normal. The right ventricular size is normal. There is normal pulmonary artery systolic pressure. The estimated right ventricular systolic pressure is 31.5 mmHg.  3. The mitral valve has been repaired/replaced. No evidence of mitral valve regurgitation. The mean mitral valve gradient is 6.0 mmHg with average heart rate of 82 bpm. There is a 32 mm [name] prosthetic annuloplasty ring present in the mitral position. Procedure Date: 02/10/20.  4. The aortic valve is tricuspid. There is mild calcification of the aortic valve. Aortic valve regurgitation is not visualized. No aortic  stenosis is present. Comparison(s): No significant change from prior study. Prior images reviewed side by side. FINDINGS  Left Ventricle: Left ventricular ejection fraction, by estimation, is 55 to 60%. The left ventricle has normal function. The left ventricle has no regional wall motion abnormalities. Strain imaging was not performed. The left ventricular internal cavity  size was normal in size. There is no left  ventricular hypertrophy. Abnormal (paradoxical) septal motion consistent with post-operative status. Left ventricular diastolic function could not be evaluated due to mitral valve repair. Left ventricular diastolic function could not be evaluated. Right Ventricle: The right ventricular size is normal. No increase in right ventricular wall thickness. Right ventricular systolic function is normal. There is normal pulmonary artery systolic pressure. The tricuspid regurgitant velocity is 2.67 m/s, and  with an assumed right atrial pressure of 3 mmHg, the estimated right ventricular systolic pressure is 31.5 mmHg. Left Atrium: Left atrial size was normal in size. Right Atrium: Right atrial size was normal in size. Pericardium: There is no evidence of pericardial effusion. Mitral Valve: The mitral valve has been repaired/replaced. No evidence of mitral valve regurgitation. There is a 32 mm [name] prosthetic annuloplasty ring present in the mitral position. Procedure Date: 02/10/20. MV peak gradient, 10.8 mmHg. The mean mitral valve gradient is 6.0 mmHg with average heart rate of 82 bpm. Tricuspid Valve: The tricuspid valve is normal in structure. Tricuspid valve regurgitation is trivial. Aortic Valve: The aortic valve is tricuspid. There is mild calcification of the aortic valve. Aortic valve regurgitation is not visualized. No aortic stenosis is present. Pulmonic Valve: The pulmonic valve was grossly normal. Pulmonic valve regurgitation is trivial. No evidence of pulmonic stenosis. Aorta: The aortic root and ascending aorta are structurally normal, with no evidence of dilitation. IAS/Shunts: No atrial level shunt detected by color flow Doppler. Additional Comments: 3D imaging was not performed.  LEFT VENTRICLE PLAX 2D LVIDd:         4.30 cm   Diastology LVIDs:         3.00 cm   LV e' medial:    4.79 cm/s LV PW:         0.80 cm   LV E/e' medial:  30.3 LV IVS:        0.90 cm   LV e' lateral:   7.83 cm/s LVOT diam:      1.70 cm   LV E/e' lateral: 18.5 LVOT Area:     2.27 cm  RIGHT VENTRICLE             IVC RV Basal diam:  2.50 cm     IVC diam: 1.60 cm RV S prime:     13.60 cm/s TAPSE (M-mode): 2.0 cm LEFT ATRIUM             Index        RIGHT ATRIUM           Index LA diam:        2.50 cm 1.47 cm/m   RA Area:     11.40 cm LA Vol (A2C):   44.2 ml 26.00 ml/m  RA Volume:   25.00 ml  14.71 ml/m LA Vol (A4C):   35.4 ml 20.82 ml/m LA Biplane Vol: 40.0 ml 23.53 ml/m   AORTA Ao Root diam: 3.30 cm Ao Asc diam:  3.10 cm MITRAL VALVE                TRICUSPID VALVE MV Area (PHT): 2.37 cm  TR Peak grad:   28.5 mmHg MV Peak grad:  10.8 mmHg    TR Vmax:        267.00 cm/s MV Mean grad:  6.0 mmHg MV Vmax:       1.64 m/s     SHUNTS MV Vmean:      113.0 cm/s   Systemic Diam: 1.70 cm MV Decel Time: 320 msec MV E velocity: 145.00 cm/s MV A velocity: 130.00 cm/s MV E/A ratio:  1.12 Lounell Schumacher MD Electronically signed by Thurmon Fair MD Signature Date/Time: 11/22/2023/3:27:48 PM    Final    MR BRAIN WO CONTRAST Result Date: 11/21/2023 CLINICAL DATA:  78 year old female code stroke presentation. Abnormal vision. EXAM: MRI HEAD WITHOUT CONTRAST TECHNIQUE: Multiplanar, multiecho pulse sequences of the brain and surrounding structures were obtained without intravenous contrast. COMPARISON:  Head CT 0143 hours today, CTA head and neck 0150 hours. FINDINGS: Brain: No restricted diffusion to suggest acute infarction. No midline shift, mass effect, evidence of mass lesion, ventriculomegaly, extra-axial collection or acute intracranial hemorrhage. Cervicomedullary junction and pituitary are within normal limits. Cerebral volume is within normal limits for age. Mild for age nonspecific T2/FLAIR heterogeneity in the cerebral white matter and the pons. However, on SWI there are multiple chronic microhemorrhages in the brainstem (series 11, image 30), and a small number of microhemorrhages scattered in both cerebral hemispheres. The extent is less  than expected for amyloid angiopathy. No cortical encephalomalacia is identified. Vascular: Major intracranial vascular flow voids are preserved. Skull and upper cervical spine: Negative. Visualized bone marrow signal is within normal limits. Sinuses/Orbits: Normal suprasellar cistern. Normal optic chiasm. Noncontrast cavernous sinus appears symmetric and unremarkable. Postoperative changes to both globes, otherwise negative orbits. Paranasal Visualized paranasal sinuses and mastoids are stable and well aerated. Other: Grossly normal visible internal auditory structures. Negative visible scalp and face. IMPRESSION: 1. No acute intracranial abnormality. 2. Chronic small vessel disease, primarily in the form of chronic microhemorrhages concentrated in the brainstem, mildly scattered in the cerebral hemispheres. The findings do not rise to the level of amyloid angiopathy. Electronically Signed   By: Odessa Fleming M.D.   On: 11/21/2023 04:36   CT ANGIO HEAD NECK W WO CM (CODE STROKE) Result Date: 11/21/2023 CLINICAL DATA:  Acute neurologic deficit.  Visual field cut. EXAM: CT ANGIOGRAPHY HEAD AND NECK WITH AND WITHOUT CONTRAST TECHNIQUE: Multidetector CT imaging of the head and neck was performed using the standard protocol during bolus administration of intravenous contrast. Multiplanar CT image reconstructions and MIPs were obtained to evaluate the vascular anatomy. Carotid stenosis measurements (when applicable) are obtained utilizing NASCET criteria, using the distal internal carotid diameter as the denominator. RADIATION DOSE REDUCTION: This exam was performed according to the departmental dose-optimization program which includes automated exposure control, adjustment of the mA and/or kV according to patient size and/or use of iterative reconstruction technique. CONTRAST:  75mL OMNIPAQUE IOHEXOL 350 MG/ML SOLN COMPARISON:  None Available. FINDINGS: Delete CTA NECK FINDINGS Skeleton: No acute abnormality or high grade  bony spinal canal stenosis. C5-6 ACDF. Other neck: Normal pharynx, larynx and major salivary glands. No cervical lymphadenopathy. Unremarkable thyroid gland. Upper chest: No pneumothorax or pleural effusion. No nodules or masses. Aortic arch: There is minimal calcific atherosclerosis of the aortic arch. Conventional 3 vessel aortic branching pattern. RIGHT carotid system: No dissection, occlusion or aneurysm. Mild atherosclerotic calcification at the carotid bifurcation without hemodynamically significant stenosis. LEFT carotid system: Normal without aneurysm, dissection or stenosis. Vertebral arteries: Codominant configuration. There is no  dissection, occlusion or flow-limiting stenosis to the skull base (V1-V3 segments). CTA HEAD FINDINGS POSTERIOR CIRCULATION: Vertebral arteries are normal. No proximal occlusion of the anterior or inferior cerebellar arteries. Basilar artery is normal. Superior cerebellar arteries are normal. Posterior cerebral arteries are normal. ANTERIOR CIRCULATION: Intracranial internal carotid arteries are normal. Anterior cerebral arteries are normal. Middle cerebral arteries are normal. Venous sinuses: As permitted by contrast timing, patent. Anatomic variants: None Review of the MIP images confirms the above findings. IMPRESSION: 1. No emergent large vessel occlusion or hemodynamically significant stenosis of the head or neck. 2. Mild right carotid bifurcation atherosclerosis without hemodynamically significant stenosis. Electronically Signed   By: Deatra Robinson M.D.   On: 11/21/2023 01:59   CT HEAD CODE STROKE WO CONTRAST Result Date: 11/21/2023 CLINICAL DATA:  Code stroke.  Visual field change EXAM: CT HEAD WITHOUT CONTRAST TECHNIQUE: Contiguous axial images were obtained from the base of the skull through the vertex without intravenous contrast. RADIATION DOSE REDUCTION: This exam was performed according to the departmental dose-optimization program which includes automated  exposure control, adjustment of the mA and/or kV according to patient size and/or use of iterative reconstruction technique. COMPARISON:  None Available. FINDINGS: Brain: There is no mass, hemorrhage or extra-axial collection. The size and configuration of the ventricles and extra-axial CSF spaces are normal. There is hypoattenuation of the periventricular white matter, most commonly indicating chronic ischemic microangiopathy. Vascular: No abnormal hyperdensity of the major intracranial arteries or dural venous sinuses. No intracranial atherosclerosis. Skull: The visualized skull base, calvarium and extracranial soft tissues are normal. Sinuses/Orbits: No fluid levels or advanced mucosal thickening of the visualized paranasal sinuses. No mastoid or middle ear effusion. The orbits are normal. ASPECTS [facility] Stroke Program Early CT Score) - Ganglionic level infarction (caudate, lentiform nuclei, internal capsule, insula, M1-M3 cortex): 7 - Supraganglionic infarction (M4-M6 cortex): 3 Total score (0-10 with 10 being normal): 10 IMPRESSION: 1. No acute intracranial abnormality. 2. ASPECTS is 10. 3. Chronic ischemic microangiopathy. These results were communicated to Dr. Ritta Slot at 1:54 am on 11/21/2023 by text page via the [facility] messaging system. Electronically Signed   By: Deatra Robinson M.D.   On: 11/21/2023 01:55   Assessment and Plan:   Paroxysmal atrial fibrillation vs frequent PACs Palpitations  Patient of Dr. Ladona Ridgel, last seen 08/2023  She reports constantly having the debate of if she currently has true atrial fibrillation or just experiences frequent PACs She notes that she has worn monitors in the past and they had not shown any atrial fibrillation Discussed need for implantable loop recorder for more reliable monitoring system  Start Eliquis 5 mg BID -- appropriate dose  Schedule for loop recorder tomorrow   Per primary Retinal artery occlusion   Risk Assessment/Risk Scores:        CHA2DS2-VASc Score = 5   This indicates a 7.2% annual risk of stroke. The patient's score is based upon: CHF History: 0 HTN History: 0 Diabetes History: 0 Stroke History: 2 Vascular Disease History: 0 Age Score: 2 Gender Score: 1        For questions or updates, please contact [facility] Please consult [email or web address] for contact info under    Signed, Olena Leatherwood, PA-C  11/22/2023 5:05 PM   I have seen and examined the patient along with Olena Leatherwood, PA-C .  I have reviewed the chart, notes and new data.  I agree with PA/NP's note.  Key new complaints: Visual changes have resolved completely.  Has  had infrequent palpitations while here, no evidence of atrial fibrillation on monitor.  No other cardiovascular complaints. Key examination changes: Appears lean and fit and younger than stated age.  Normal cardiovascular exam. Key new findings / data: Telemetry shows occasional PACs.  No atrial fibrillation seen.  Echocardiogram shows intact mitral valve repair, preserved left ventricular systolic function, relatively small left atrium, no new abnormalities.  ECG shows normal sinus rhythm.  Previous coronary calcium score is substantially lower than average for age although there was some aortic atherosclerosis.  CT angiogram of the neck shows mild right carotid bifurcation atherosclerosis without stenosis.  PLAN: In my opinion, it would appear more likely that her "TIA-equivalent" event is a cardioembolic 1 related to paroxysmal atrial fibrillation, rather than atherothrombosis.  She is at increased risk of having recurrent atrial fibrillation since she had postop A-fib, has a history of atriotomy and has very frequent palpitations from premature atrial contractions. If we are going to use empirical stroke prevention medication, I would favor the use of anticoagulant such as Eliquis 5 mg twice daily, rather than dual antiplatelet therapy. At the same time, it is  important for long-term management to establish whether or not she is truly having recurrent atrial fibrillation and I recommend loop recorder implantation.  We discussed option for having this done tomorrow as an inpatient or coming back to the office as an outpatient.  She prefers the latter.  In summary would recommend Eliquis 5 mg twice daily until we clarify whether or not she is having recurrent atrial fibrillation with an implantable loop recorder, which will be placed in the cardiology office in the near future.  Thurmon Fair, MD, [facility] [phone] 11/22/2023, 5:35 PM

## 2023-11-22 NOTE — Plan of Care (Signed)
  Problem: Ischemic Stroke/TIA Tissue Perfusion: Goal: Complications of ischemic stroke/TIA will be minimized Outcome: Progressing   Problem: Education: Goal: Knowledge of disease or condition will improve Outcome: Progressing Goal: Knowledge of secondary prevention will improve (MUST DOCUMENT ALL) Outcome: Progressing Goal: Knowledge of patient specific risk factors will improve (DELETE if not current risk factor) Outcome: Progressing   Problem: Self-Care: Goal: Ability to participate in self-care as condition permits will improve Outcome: Progressing Goal: Verbalization of feelings and concerns over difficulty with self-care will improve Outcome: Progressing Goal: Ability to communicate needs accurately will improve Outcome: Progressing   Problem: Safety: Goal: Ability to remain free from injury will improve Outcome: Progressing   Problem: Skin Integrity: Goal: Risk for impaired skin integrity will decrease Outcome: Progressing   Problem: Pain Managment: Goal: General experience of comfort will improve and/or be controlled Outcome: Progressing   Problem: Health Behavior/Discharge Planning: Goal: Ability to manage health-related needs will improve Outcome: Progressing

## 2023-11-22 NOTE — Progress Notes (Signed)
  Echocardiogram 2D Echocardiogram has been performed.  Delcie Roch 11/22/2023, 2:25 PM

## 2023-11-22 NOTE — Care Management Obs Status (Signed)
 MEDICARE OBSERVATION STATUS NOTIFICATION   Patient Details  Name: Kellie Humphrey MRN: [id number] Date of Birth: [birth year]   Medicare Observation Status Notification Given:  Yes    Isaias Cowman, RN 11/22/2023, 9:14 AM

## 2023-11-22 NOTE — Discharge Summary (Signed)
 Physician Discharge Summary   Patient: Kellie Humphrey MRN: [id number] DOB: 1946/09/10  Admit date:     11/21/2023  Discharge date: 11/22/23  Discharge Physician: Meredeth Ide   PCP: Cleatis Polka., MD   Recommendations at discharge:   Follow-up PCP in 1 week Start taking Eliquis 5 mg p.o. twice daily from 11/23/2023 Follow-up cardiology, EP for loop recorder placement as outpatient  Discharge Diagnoses: Active Problems:   Atrial fibrillation (HCC)   Palpitations   Elevated serum creatinine   Retinal artery occlusion, branch, left  Resolved Problems:   * No resolved hospital problems. *  Hospital Course: 78 y.o. female with medical history significant of medical history is as listed below.  I do not find any history of atrial fibrillation from the patient or the medical record.  Patient was in her usual state of health till approximately 11 PM last evening when she reports an abrupt onset of visual field defect/blurring/blackening on the left lower visual field of the left eye.  This was a monocular partial visual field loss.      Assessment and Plan:  Retinal artery occlusion, branch, left -Presented with visual field defect, resolved -Neurology was consulted, started on aspirin Plavix -However due to previous history of atrial fibrillation, cardiology was consulted -Cardiology recommended to start Eliquis and discontinue aspirin and Plavix -Will discharge on Eliquis 5 mg p.o. twice daily, will follow-up EP cardiology as outpatient for loop recorder placement -Echocardiogram did not show significant abnormalities. -Continue Lipitor 80 mg daily    Elevated serum creatinine -Unclear theology -UA was unremarkable -Follow-up PCP in 1 week    Palpitations Patient takes flecainide -Follow-up EP cardiology   Atrial fibrillation [facility]) Patient had postop atrial fibrillation after mitral valve repair in 2021 -At that time she had cardioversion and IV  amiodarone -Cardiology was consulted during this admission, recommended to start her on Eliquis and get loop recorder as outpatient -Will start on Eliquis 5 mg p.o. twice daily   Tremors -Patient takes primidone at home, which interacts with Eliquis -Will discontinue primidone -Patient will follow-up with neurology to consider alternative treatments for tremors including propranolol.       Consultants: Neurology, cardiology Procedures performed: Echocardiogram Disposition: Home Diet recommendation:  Discharge Diet Orders (From admission, onward)     Start     Ordered   11/22/23 0000  Diet - low sodium heart healthy        11/22/23 1741           Cardiac diet DISCHARGE MEDICATION: Allergies as of 11/22/2023       Reactions   Codeine Nausea And Vomiting   Phenergan [promethazine] Other (See Comments)   Long QT   Betadine [povidone Iodine] Other (See Comments)   Irritation - vaginal   Oxycodone Nausea And Vomiting        Medication List     STOP taking these medications    aspirin EC 81 MG tablet   primidone 50 MG tablet Commonly known as: MYSOLINE       TAKE these medications    acetaminophen 500 MG tablet Commonly known as: TYLENOL Take 500-1,000 mg by mouth as needed for mild pain (pain score 1-3).   apixaban 5 MG Tabs tablet Commonly known as: ELIQUIS Take 1 tablet (5 mg total) by mouth 2 (two) times daily. Start taking on: November 23, 2023   atorvastatin 80 MG tablet Commonly known as: LIPITOR Take 1 tablet (80 mg total) by mouth daily. Start  taking on: November 23, 2023   B-12 5000 MCG Caps Take 5,000 mcg by mouth once a week. Mondays   Biotin 5000 MCG Caps Take 5,000 mcg by mouth daily.   flecainide 50 MG tablet Commonly known as: TAMBOCOR Take two tablets (100 mg) by mouth every morning and one and half tablets (75 mg) every evening   gabapentin 600 MG tablet Commonly known as: NEURONTIN Take 600 mg by mouth 2 (two) times  daily.   levothyroxine 50 MCG tablet Commonly known as: SYNTHROID Take 1 tablet (50 mcg total) by mouth daily before breakfast.   melatonin 5 MG Tabs Take 5 mg by mouth at bedtime.   metoprolol tartrate 25 MG tablet Commonly known as: LOPRESSOR Take 1 tablet (25 mg total) by mouth 2 (two) times daily.   metroNIDAZOLE 0.75 % cream Commonly known as: METROCREAM Apply 1 Application topically as needed.   Systane Ultra 0.4-0.3 % Soln Generic drug: Polyethyl Glycol-Propyl Glycol Place 1 drop into the left eye every 4 (four) hours as needed (dry/irritated eyes. (4-6 times daily)).   Vitamin D 50 MCG (2000 UT) tablet Take 2,000 Units by mouth daily.        Discharge Exam: Filed Weights   11/21/23 0135 11/22/23 0508  Weight: 60.3 kg 60.3 kg   General-appears in no acute distress Heart-S1-S2, regular, no murmur auscultated Lungs-clear to auscultation bilaterally, no wheezing or crackles auscultated Abdomen-soft, nontender, no organomegaly Extremities-no edema in the lower extremities Neuro-alert, oriented x3, no focal deficit noted  Condition at discharge: good  The results of significant diagnostics from this hospitalization (including imaging, microbiology, ancillary and laboratory) are listed below for reference.   Imaging Studies: ECHOCARDIOGRAM COMPLETE Result Date: 11/22/2023    ECHOCARDIOGRAM REPORT   Patient Name:   Kellie Humphrey [facility] Date of Exam: 11/22/2023 Medical Rec #:  [id number]          Height:       67.0 in Accession #:    [id number]         Weight:       132.9 lb Date of Birth:  04-20-1946          BSA:          1.700 m Patient Age:    77 years           BP:           99/66 mmHg Patient Gender: F                  HR:           80 bpm. Exam Location:  Inpatient Procedure: 2D Echo (Both Spectral and Color Flow Doppler were utilized during            procedure). Indications:    stroke  History:        Patient has prior history of Echocardiogram examinations, most                  recent 03/19/2023. Arrythmias:Atrial Fibrillation.                  Mitral Valve: 32 mm [name] prosthetic annuloplasty ring                 valve is present in the mitral position. Procedure Date:                 02/10/20.  Sonographer:    Delcie Roch RDCS Referring Phys: [id number] Angie Fava IMPRESSIONS  1. Left ventricular ejection fraction, by estimation, is 55 to 60%. The left ventricle has normal function. The left ventricle has no regional wall motion abnormalities. Left ventricular diastolic function could not be evaluated.  2. Right ventricular systolic function is normal. The right ventricular size is normal. There is normal pulmonary artery systolic pressure. The estimated right ventricular systolic pressure is 31.5 mmHg.  3. The mitral valve has been repaired/replaced. No evidence of mitral valve regurgitation. The mean mitral valve gradient is 6.0 mmHg with average heart rate of 82 bpm. There is a 32 mm [name] prosthetic annuloplasty ring present in the mitral position. Procedure Date: 02/10/20.  4. The aortic valve is tricuspid. There is mild calcification of the aortic valve. Aortic valve regurgitation is not visualized. No aortic stenosis is present. Comparison(s): No significant change from prior study. Prior images reviewed side by side. FINDINGS  Left Ventricle: Left ventricular ejection fraction, by estimation, is 55 to 60%. The left ventricle has normal function. The left ventricle has no regional wall motion abnormalities. Strain imaging was not performed. The left ventricular internal cavity  size was normal in size. There is no left ventricular hypertrophy. Abnormal (paradoxical) septal motion consistent with post-operative status. Left ventricular diastolic function could not be evaluated due to mitral valve repair. Left ventricular diastolic function could not be evaluated. Right Ventricle: The right ventricular size is normal. No increase in right ventricular  wall thickness. Right ventricular systolic function is normal. There is normal pulmonary artery systolic pressure. The tricuspid regurgitant velocity is 2.67 m/s, and  with an assumed right atrial pressure of 3 mmHg, the estimated right ventricular systolic pressure is 31.5 mmHg. Left Atrium: Left atrial size was normal in size. Right Atrium: Right atrial size was normal in size. Pericardium: There is no evidence of pericardial effusion. Mitral Valve: The mitral valve has been repaired/replaced. No evidence of mitral valve regurgitation. There is a 32 mm [name] prosthetic annuloplasty ring present in the mitral position. Procedure Date: 02/10/20. MV peak gradient, 10.8 mmHg. The mean mitral valve gradient is 6.0 mmHg with average heart rate of 82 bpm. Tricuspid Valve: The tricuspid valve is normal in structure. Tricuspid valve regurgitation is trivial. Aortic Valve: The aortic valve is tricuspid. There is mild calcification of the aortic valve. Aortic valve regurgitation is not visualized. No aortic stenosis is present. Pulmonic Valve: The pulmonic valve was grossly normal. Pulmonic valve regurgitation is trivial. No evidence of pulmonic stenosis. Aorta: The aortic root and ascending aorta are structurally normal, with no evidence of dilitation. IAS/Shunts: No atrial level shunt detected by color flow Doppler. Additional Comments: 3D imaging was not performed.  LEFT VENTRICLE PLAX 2D LVIDd:         4.30 cm   Diastology LVIDs:         3.00 cm   LV e' medial:    4.79 cm/s LV PW:         0.80 cm   LV E/e' medial:  30.3 LV IVS:        0.90 cm   LV e' lateral:   7.83 cm/s LVOT diam:     1.70 cm   LV E/e' lateral: 18.5 LVOT Area:     2.27 cm  RIGHT VENTRICLE             IVC RV Basal diam:  2.50 cm     IVC diam: 1.60 cm RV S prime:     13.60 cm/s TAPSE (M-mode): 2.0 cm LEFT ATRIUM  Index        RIGHT ATRIUM           Index LA diam:        2.50 cm 1.47 cm/m   RA Area:     11.40 cm LA Vol (A2C):   44.2 ml  26.00 ml/m  RA Volume:   25.00 ml  14.71 ml/m LA Vol (A4C):   35.4 ml 20.82 ml/m LA Biplane Vol: 40.0 ml 23.53 ml/m   AORTA Ao Root diam: 3.30 cm Ao Asc diam:  3.10 cm MITRAL VALVE                TRICUSPID VALVE MV Area (PHT): 2.37 cm     TR Peak grad:   28.5 mmHg MV Peak grad:  10.8 mmHg    TR Vmax:        267.00 cm/s MV Mean grad:  6.0 mmHg MV Vmax:       1.64 m/s     SHUNTS MV Vmean:      113.0 cm/s   Systemic Diam: 1.70 cm MV Decel Time: 320 msec MV E velocity: 145.00 cm/s MV A velocity: 130.00 cm/s MV E/A ratio:  1.12 Mihai Croitoru MD Electronically signed by Thurmon Fair MD Signature Date/Time: 11/22/2023/3:27:48 PM    Final    MR BRAIN WO CONTRAST Result Date: 11/21/2023 CLINICAL DATA:  78 year old female code stroke presentation. Abnormal vision. EXAM: MRI HEAD WITHOUT CONTRAST TECHNIQUE: Multiplanar, multiecho pulse sequences of the brain and surrounding structures were obtained without intravenous contrast. COMPARISON:  Head CT 0143 hours today, CTA head and neck 0150 hours. FINDINGS: Brain: No restricted diffusion to suggest acute infarction. No midline shift, mass effect, evidence of mass lesion, ventriculomegaly, extra-axial collection or acute intracranial hemorrhage. Cervicomedullary junction and pituitary are within normal limits. Cerebral volume is within normal limits for age. Mild for age nonspecific T2/FLAIR heterogeneity in the cerebral white matter and the pons. However, on SWI there are multiple chronic microhemorrhages in the brainstem (series 11, image 30), and a small number of microhemorrhages scattered in both cerebral hemispheres. The extent is less than expected for amyloid angiopathy. No cortical encephalomalacia is identified. Vascular: Major intracranial vascular flow voids are preserved. Skull and upper cervical spine: Negative. Visualized bone marrow signal is within normal limits. Sinuses/Orbits: Normal suprasellar cistern. Normal optic chiasm. Noncontrast cavernous  sinus appears symmetric and unremarkable. Postoperative changes to both globes, otherwise negative orbits. Paranasal Visualized paranasal sinuses and mastoids are stable and well aerated. Other: Grossly normal visible internal auditory structures. Negative visible scalp and face. IMPRESSION: 1. No acute intracranial abnormality. 2. Chronic small vessel disease, primarily in the form of chronic microhemorrhages concentrated in the brainstem, mildly scattered in the cerebral hemispheres. The findings do not rise to the level of amyloid angiopathy. Electronically Signed   By: Odessa Fleming M.D.   On: 11/21/2023 04:36   CT ANGIO HEAD NECK W WO CM (CODE STROKE) Result Date: 11/21/2023 CLINICAL DATA:  Acute neurologic deficit.  Visual field cut. EXAM: CT ANGIOGRAPHY HEAD AND NECK WITH AND WITHOUT CONTRAST TECHNIQUE: Multidetector CT imaging of the head and neck was performed using the standard protocol during bolus administration of intravenous contrast. Multiplanar CT image reconstructions and MIPs were obtained to evaluate the vascular anatomy. Carotid stenosis measurements (when applicable) are obtained utilizing NASCET criteria, using the distal internal carotid diameter as the denominator. RADIATION DOSE REDUCTION: This exam was performed according to the departmental dose-optimization program which includes automated exposure control, adjustment of the  mA and/or kV according to patient size and/or use of iterative reconstruction technique. CONTRAST:  75mL OMNIPAQUE IOHEXOL 350 MG/ML SOLN COMPARISON:  None Available. FINDINGS: Delete CTA NECK FINDINGS Skeleton: No acute abnormality or high grade bony spinal canal stenosis. C5-6 ACDF. Other neck: Normal pharynx, larynx and major salivary glands. No cervical lymphadenopathy. Unremarkable thyroid gland. Upper chest: No pneumothorax or pleural effusion. No nodules or masses. Aortic arch: There is minimal calcific atherosclerosis of the aortic arch. Conventional 3 vessel  aortic branching pattern. RIGHT carotid system: No dissection, occlusion or aneurysm. Mild atherosclerotic calcification at the carotid bifurcation without hemodynamically significant stenosis. LEFT carotid system: Normal without aneurysm, dissection or stenosis. Vertebral arteries: Codominant configuration. There is no dissection, occlusion or flow-limiting stenosis to the skull base (V1-V3 segments). CTA HEAD FINDINGS POSTERIOR CIRCULATION: Vertebral arteries are normal. No proximal occlusion of the anterior or inferior cerebellar arteries. Basilar artery is normal. Superior cerebellar arteries are normal. Posterior cerebral arteries are normal. ANTERIOR CIRCULATION: Intracranial internal carotid arteries are normal. Anterior cerebral arteries are normal. Middle cerebral arteries are normal. Venous sinuses: As permitted by contrast timing, patent. Anatomic variants: None Review of the MIP images confirms the above findings. IMPRESSION: 1. No emergent large vessel occlusion or hemodynamically significant stenosis of the head or neck. 2. Mild right carotid bifurcation atherosclerosis without hemodynamically significant stenosis. Electronically Signed   By: Deatra Robinson M.D.   On: 11/21/2023 01:59   CT HEAD CODE STROKE WO CONTRAST Result Date: 11/21/2023 CLINICAL DATA:  Code stroke.  Visual field change EXAM: CT HEAD WITHOUT CONTRAST TECHNIQUE: Contiguous axial images were obtained from the base of the skull through the vertex without intravenous contrast. RADIATION DOSE REDUCTION: This exam was performed according to the departmental dose-optimization program which includes automated exposure control, adjustment of the mA and/or kV according to patient size and/or use of iterative reconstruction technique. COMPARISON:  None Available. FINDINGS: Brain: There is no mass, hemorrhage or extra-axial collection. The size and configuration of the ventricles and extra-axial CSF spaces are normal. There is  hypoattenuation of the periventricular white matter, most commonly indicating chronic ischemic microangiopathy. Vascular: No abnormal hyperdensity of the major intracranial arteries or dural venous sinuses. No intracranial atherosclerosis. Skull: The visualized skull base, calvarium and extracranial soft tissues are normal. Sinuses/Orbits: No fluid levels or advanced mucosal thickening of the visualized paranasal sinuses. No mastoid or middle ear effusion. The orbits are normal. ASPECTS [facility] Stroke Program Early CT Score) - Ganglionic level infarction (caudate, lentiform nuclei, internal capsule, insula, M1-M3 cortex): 7 - Supraganglionic infarction (M4-M6 cortex): 3 Total score (0-10 with 10 being normal): 10 IMPRESSION: 1. No acute intracranial abnormality. 2. ASPECTS is 10. 3. Chronic ischemic microangiopathy. These results were communicated to Dr. Ritta Slot at 1:54 am on 11/21/2023 by text page via the [facility] messaging system. Electronically Signed   By: Deatra Robinson M.D.   On: 11/21/2023 01:55    Microbiology: Results for orders placed or performed during the hospital encounter of 02/08/20  Surgical pcr screen     Status: None   Collection Time: 02/08/20 11:38 AM   Specimen: Nasal Mucosa; Nasal Swab  Result Value Ref Range Status   MRSA, PCR NEGATIVE NEGATIVE Final   Staphylococcus aureus NEGATIVE NEGATIVE Final    Comment: (NOTE) The Xpert SA Assay (FDA approved for NASAL specimens in patients 77 years of age and older), is one component of a comprehensive surveillance program. It is not intended to diagnose infection nor to guide or monitor treatment.  Performed at [facility], [address], [place], [place] [address]     Labs: CBC: Recent Labs  Lab 11/21/23 0135 11/21/23 0139  WBC 7.8  --   NEUTROABS 5.6  --   HGB 14.1 15.3*  HCT 43.0 45.0  MCV 90.7  --   PLT 266  --    Basic Metabolic Panel: Recent Labs  Lab 11/21/23 0135 11/21/23 0139  NA 139 139   K 4.0 3.9  CL 103 104  CO2 23  --   GLUCOSE 104* 101*  BUN 17 20  CREATININE 1.18* 1.30*  CALCIUM 9.6  --   MG 2.2  --    Liver Function Tests: Recent Labs  Lab 11/21/23 0135  AST 27  ALT 20  ALKPHOS 97  BILITOT 0.6  PROT 7.2  ALBUMIN 4.2   CBG: No results for input(s): "GLUCAP" in the last 168 hours.  Discharge time spent: greater than 30 minutes.  Signed: Meredeth Ide, MD [facility] 11/22/2023

## 2023-11-22 NOTE — Plan of Care (Signed)
  Problem: Ischemic Stroke/TIA Tissue Perfusion: Goal: Complications of ischemic stroke/TIA will be minimized 11/22/2023 0604 by [place], RN Outcome: Progressing 11/22/2023 0550 by [place], RN Outcome: Progressing   Problem: Health Behavior/Discharge Planning: Goal: Ability to manage health-related needs will improve 11/22/2023 0604 by [place], RN Outcome: Progressing 11/22/2023 0550 by [place], RN Outcome: Progressing Goal: Goals will be collaboratively established with patient/family 11/22/2023 0604 by [place], RN Outcome: Progressing 11/22/2023 0550 by [place], RN Outcome: Progressing   Problem: Coping: Goal: Will verbalize positive feelings about self 11/22/2023 0604 by [place], RN Outcome: Progressing 11/22/2023 0550 by [place], RN Outcome: Progressing Goal: Will identify appropriate support needs 11/22/2023 0604 by [place], RN Outcome: Progressing 11/22/2023 0550 by [place], RN Outcome: Progressing

## 2023-11-22 NOTE — Plan of Care (Signed)
  Problem: Education: Goal: Knowledge of disease or condition will improve Outcome: Progressing Goal: Knowledge of secondary prevention will improve (MUST DOCUMENT ALL) Outcome: Progressing Goal: Knowledge of patient specific risk factors will improve (DELETE if not current risk factor) Outcome: Progressing   Problem: Ischemic Stroke/TIA Tissue Perfusion: Goal: Complications of ischemic stroke/TIA will be minimized Outcome: Progressing   Problem: Coping: Goal: Will verbalize positive feelings about self Outcome: Progressing Goal: Will identify appropriate support needs Outcome: Progressing   Problem: Self-Care: Goal: Ability to participate in self-care as condition permits will improve Outcome: Progressing Goal: Verbalization of feelings and concerns over difficulty with self-care will improve Outcome: Progressing Goal: Ability to communicate needs accurately will improve Outcome: Progressing   Problem: Nutrition: Goal: Risk of aspiration will decrease Outcome: Progressing Goal: Dietary intake will improve Outcome: Progressing   Problem: Education: Goal: Knowledge of General Education information will improve Description: Including pain rating scale, medication(s)/side effects and non-pharmacologic comfort measures Outcome: Progressing   Problem: Health Behavior/Discharge Planning: Goal: Ability to manage health-related needs will improve Outcome: Progressing   Problem: Clinical Measurements: Goal: Ability to maintain clinical measurements within normal limits will improve Outcome: Progressing Goal: Will remain free from infection Outcome: Progressing Goal: Diagnostic test results will improve Outcome: Progressing Goal: Respiratory complications will improve Outcome: Progressing Goal: Cardiovascular complication will be avoided Outcome: Progressing   Problem: Activity: Goal: Risk for activity intolerance will decrease Outcome: Progressing   Problem:  Nutrition: Goal: Adequate nutrition will be maintained Outcome: Progressing   Problem: Coping: Goal: Level of anxiety will decrease Outcome: Progressing   Problem: Elimination: Goal: Will not experience complications related to bowel motility Outcome: Progressing Goal: Will not experience complications related to urinary retention Outcome: Progressing   Problem: Pain Managment: Goal: General experience of comfort will improve and/or be controlled Outcome: Progressing   Problem: Safety: Goal: Ability to remain free from injury will improve Outcome: Progressing   Problem: Skin Integrity: Goal: Risk for impaired skin integrity will decrease Outcome: Progressing

## 2023-11-22 NOTE — Progress Notes (Incomplete)
[facility]  PROGRESS NOTE  Kellie Humphrey NUU:[id number] DOB: 12-29-1945 DOA: 11/21/2023 PCP: Cleatis Polka., MD   Brief HPI:    78 y.o. female with medical history significant of medical history is as listed below.  I do not find any history of atrial fibrillation from the patient or the medical record.  Patient was in her usual state of health till approximately 11 PM last evening when she reports an abrupt onset of visual field defect/blurring/blackening on the left lower visual field of the left eye.  This was a monocular partial visual field loss.      Assessment/Plan:   Retinal artery occlusion, branch, left Present on admission.  At this time no visual field defect with confrontation testing.  Patient visual field defect has resolved.  Patient has been started on aspirin and Plavix.  I will also start the patient on high intensity statin given her lipid values.  As well as retinal artery occlusion noted on fundus exam by ophthalmology.  Patient will be maintained on telemetry.  Echo pending.  Patient already on regular diet.  Appreciate neurology help as well.   Elevated serum creatinine This is felt to represent CKD, I do not have any baseline creatinine recently.  I will check a urinalysis and give IV fluids given that patient get contrast for her CAT scan angiography.   Palpitations This is chronic, patient uses flecainide and metoprolol for this.  Patient's last dose of flecainide was last evening.  I will hold the metoprolol for today.  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  Consider restarting tomorrow morning.   Atrial fibrillation (HCC) I note that this is documented in the chart from 4 years ago, no recent documentation in cardiology notes.  Maintain on telemetry.  Patient may need prolonged loop recorder at discharge.    Medications      stroke: early stages of recovery book   Does not apply Once   aspirin  81 mg  Oral Daily   atorvastatin  80 mg Oral Daily   clopidogrel  75 mg Oral Daily   flecainide  100 mg Oral q AM   flecainide  75 mg Oral QHS   gabapentin  600 mg Oral BID   levothyroxine  50 mcg Oral QAC breakfast   primidone  50 mg Oral QHS     Data Reviewed:   CBG:  No results for input(s): "GLUCAP" in the last 168 hours.  SpO2: 100 %    Vitals:   11/22/23 0342 11/22/23 0506 11/22/23 0508 11/22/23 0801  BP: (!) 91/55 102/70  116/74  Pulse: 63 61  70  Resp: 15   18  Temp: 98.2 F (36.8 C)   98.1 F (36.7 C)  TempSrc: Oral   Oral  SpO2: 98%   100%  Weight:   60.3 kg   Height:          Data Reviewed:  Basic Metabolic Panel: Recent Labs  Lab 11/21/23 0135 11/21/23 0139  NA 139 139  K 4.0 3.9  CL 103 104  CO2 23  --   GLUCOSE 104* 101*  BUN 17 20  CREATININE 1.18* 1.30*  CALCIUM 9.6  --   MG 2.2  --     CBC: Recent Labs  Lab 11/21/23 0135 11/21/23 0139  WBC 7.8  --   NEUTROABS 5.6  --   HGB 14.1 15.3*  HCT 43.0 45.0  MCV 90.7  --  PLT 266  --     LFT Recent Labs  Lab 11/21/23 0135  AST 27  ALT 20  ALKPHOS 97  BILITOT 0.6  PROT 7.2  ALBUMIN 4.2     Antibiotics: Anti-infectives (From admission, onward)    None        DVT prophylaxis: ***  Code Status: ***  Family Communication: ***   CONSULTS ***   Subjective   ***   Objective    Physical Examination:   General:  *** Cardiovascular: *** Respiratory: *** Abdomen: *** Extremities: ***  Neurologic:  ***   Status is: Inpatient:  ***           Demba Nigh S Porfirio Bollier   [facility] If 7PM-7AM, please contact night-coverage at [email or web address], Office  [phone]   11/22/2023, 9:09 AM  LOS: 0 days

## 2023-11-22 NOTE — Progress Notes (Signed)
 SLP Cancellation Note  Patient Details Name: Kellie Humphrey MRN: [id number] DOB: Aug 05, 1946   Cancelled treatment:        Pt screened for speech/language and cognitive deficits, husband at bedside. The pt was A&Ox4 with clear fluent speech. The pt and pt's husband report a full return to baseline and no concerns. No acute ST needs at this time, SLP signing off. Please reconsult if new concerns arise. Thank you!  Dione Housekeeper M.S. CCC-SLP

## 2023-11-30 DIAGNOSIS — I340 Nonrheumatic mitral (valve) insufficiency: Secondary | ICD-10-CM | POA: Diagnosis not present

## 2023-11-30 DIAGNOSIS — H34232 Retinal artery branch occlusion, left eye: Secondary | ICD-10-CM | POA: Diagnosis not present

## 2023-11-30 DIAGNOSIS — G250 Essential tremor: Secondary | ICD-10-CM | POA: Diagnosis not present

## 2023-11-30 DIAGNOSIS — E785 Hyperlipidemia, unspecified: Secondary | ICD-10-CM | POA: Diagnosis not present

## 2023-11-30 DIAGNOSIS — I480 Paroxysmal atrial fibrillation: Secondary | ICD-10-CM | POA: Diagnosis not present

## 2023-12-02 DIAGNOSIS — M8580 Other specified disorders of bone density and structure, unspecified site: Secondary | ICD-10-CM | POA: Diagnosis not present

## 2023-12-02 DIAGNOSIS — E039 Hypothyroidism, unspecified: Secondary | ICD-10-CM | POA: Diagnosis not present

## 2023-12-02 DIAGNOSIS — E785 Hyperlipidemia, unspecified: Secondary | ICD-10-CM | POA: Diagnosis not present

## 2023-12-02 LAB — LAB REPORT - SCANNED
EGFR (Non-African Amer.): 81.6
Free T4: 1.49 ng/dL
TSH: 4.04 (ref 0.41–5.90)

## 2023-12-09 DIAGNOSIS — I471 Supraventricular tachycardia, unspecified: Secondary | ICD-10-CM | POA: Diagnosis not present

## 2023-12-09 DIAGNOSIS — Z1339 Encounter for screening examination for other mental health and behavioral disorders: Secondary | ICD-10-CM | POA: Diagnosis not present

## 2023-12-09 DIAGNOSIS — G2581 Restless legs syndrome: Secondary | ICD-10-CM | POA: Diagnosis not present

## 2023-12-09 DIAGNOSIS — Z0000 Encounter for general adult medical examination without abnormal findings: Secondary | ICD-10-CM | POA: Diagnosis not present

## 2023-12-09 DIAGNOSIS — M8580 Other specified disorders of bone density and structure, unspecified site: Secondary | ICD-10-CM | POA: Diagnosis not present

## 2023-12-09 DIAGNOSIS — I341 Nonrheumatic mitral (valve) prolapse: Secondary | ICD-10-CM | POA: Diagnosis not present

## 2023-12-09 DIAGNOSIS — E039 Hypothyroidism, unspecified: Secondary | ICD-10-CM | POA: Diagnosis not present

## 2023-12-09 DIAGNOSIS — I700 Atherosclerosis of aorta: Secondary | ICD-10-CM | POA: Diagnosis not present

## 2023-12-09 DIAGNOSIS — Z853 Personal history of malignant neoplasm of breast: Secondary | ICD-10-CM | POA: Diagnosis not present

## 2023-12-09 DIAGNOSIS — I480 Paroxysmal atrial fibrillation: Secondary | ICD-10-CM | POA: Diagnosis not present

## 2023-12-09 DIAGNOSIS — H9311 Tinnitus, right ear: Secondary | ICD-10-CM | POA: Diagnosis not present

## 2023-12-09 DIAGNOSIS — G629 Polyneuropathy, unspecified: Secondary | ICD-10-CM | POA: Diagnosis not present

## 2023-12-09 DIAGNOSIS — I340 Nonrheumatic mitral (valve) insufficiency: Secondary | ICD-10-CM | POA: Diagnosis not present

## 2023-12-09 DIAGNOSIS — G250 Essential tremor: Secondary | ICD-10-CM | POA: Diagnosis not present

## 2023-12-09 DIAGNOSIS — Z1331 Encounter for screening for depression: Secondary | ICD-10-CM | POA: Diagnosis not present

## 2023-12-09 DIAGNOSIS — R82998 Other abnormal findings in urine: Secondary | ICD-10-CM | POA: Diagnosis not present

## 2023-12-09 DIAGNOSIS — Z8669 Personal history of other diseases of the nervous system and sense organs: Secondary | ICD-10-CM | POA: Diagnosis not present

## 2023-12-09 DIAGNOSIS — E785 Hyperlipidemia, unspecified: Secondary | ICD-10-CM | POA: Diagnosis not present

## 2023-12-10 ENCOUNTER — Ambulatory Visit: Payer: PPO | Admitting: Neurology

## 2023-12-10 ENCOUNTER — Encounter: Payer: Self-pay | Admitting: Neurology

## 2023-12-10 VITALS — BP 115/65 | HR 60 | Ht 67.0 in | Wt 132.0 lb

## 2023-12-10 DIAGNOSIS — M810 Age-related osteoporosis without current pathological fracture: Secondary | ICD-10-CM | POA: Diagnosis not present

## 2023-12-10 DIAGNOSIS — G2581 Restless legs syndrome: Secondary | ICD-10-CM | POA: Diagnosis not present

## 2023-12-10 DIAGNOSIS — G250 Essential tremor: Secondary | ICD-10-CM

## 2023-12-10 DIAGNOSIS — M792 Neuralgia and neuritis, unspecified: Secondary | ICD-10-CM

## 2023-12-10 MED ORDER — GABAPENTIN 600 MG PO TABS: 600.0000 mg | ORAL_TABLET | Freq: Three times a day (TID) | ORAL | 4 refills | Status: AC

## 2023-12-10 NOTE — Addendum Note (Signed)
 Addended by: Naomie Dean B on: 12/10/2023 12:04 PM   Modules accepted: Level of Service

## 2023-12-10 NOTE — Patient Instructions (Addendum)
 Increase Gabapentin from 2x a day to 3x a day  If Gabapentin not effective can do the following:  Can further increase the Gabapentin or change to ER at bedtime and incresae the dose She is on metoprolol could consider a change to propranolol (need to discuss with cardiology) In 6 months if off of the Eliquis could initiate primidone again Could try Lyrica MRI guided ultrasound for tremors at [facility] If significant and untreatable there is also Deep Brain Stimulation at [name] Other medications we use, less effective but there is still evidence for Topiramate (which BTW is also a medication used in headaches/migraines) The Gabapentin is also helpful for neuropathy/nerve damage which is likely why you still have pain at the site where you hit your head but we do have other headache medications  Essential Tremor A tremor is trembling or shaking that a person cannot control. Most tremors affect the hands or arms. Tremors can also affect the head, vocal cords, legs, and other parts of the body. Essential tremor is a tremor without a known cause. Usually, it occurs while a person is trying to perform an action. It tends to get worse gradually as a person ages. What are the causes? The cause of this condition is not known, but it often runs in families. What increases the risk? You are more likely to develop this condition if: You have a family member with essential tremor. You are 42 years of age or older. What are the signs or symptoms? The main sign of a tremor is a rhythmic shaking of certain parts of your body that is uncontrolled and unintentional. You may: Have difficulty eating with a spoon or fork. Have difficulty writing. Nod your head up and down or side to side. Have a quivering voice. The shaking may: Get worse over time. Come and go. Be more noticeable on one side of your body. Get worse due to stress, tiredness (fatigue), caffeine, and extreme heat or cold. How is  this diagnosed? This condition may be diagnosed based on: Your symptoms and medical history. A physical exam. There is no single test to diagnose an essential tremor. However, your health care provider may order tests to rule out other causes of your condition. These may include: Blood and urine tests. Imaging studies of your brain, such as a CT scan or MRI. How is this treated? Treatment for essential tremor depends on the severity of the condition. Mild tremors may not need treatment if they do not affect your day-to-day life. Severe tremors may need to be treated using one or more of the following options: Medicines. Injections of a substance called botulinum toxin. Procedures such as deep brain stimulation (DBS) implantation or MRI-guided ultrasound treatment. Lifestyle changes. Occupational or physical therapy. Follow these instructions at home: Lifestyle  Do not use any products that contain nicotine or tobacco. These products include cigarettes, chewing tobacco, and vaping devices, such as e-cigarettes. If you need help quitting, ask your health care provider. Limit your caffeine intake as told by your health care provider. Try to get 8 hours of sleep each night. Find ways to manage your stress that fit your lifestyle and personality. Consider trying meditation or yoga. Try to anticipate stressful situations and allow extra time to manage them. If you are struggling emotionally with the effects of your tremor, consider working with a mental health provider. General instructions Take over-the-counter and prescription medicines only as told by your health care provider. Avoid extreme heat and extreme  cold. Keep all follow-up visits. This is important. Visits may include physical therapy visits. Where to find more information [employer] [facility] Neurological Disorders and Stroke: [employer] Contact a health care provider if: You experience any changes in the location or  intensity of your tremors. You start having a tremor after starting a new medicine. You have a tremor with other symptoms, such as: Numbness. Tingling. Pain. Weakness. Your tremor gets worse. Your tremor interferes with your daily life. You feel down, blue, or sad for at least 2 weeks in a row. Worrying about your tremor and what other people think about you interferes with your everyday life functions, including relationships, work, or school. Summary Essential tremor is a tremor without a known cause. Usually, it occurs when you are trying to perform an action. You are more likely to develop this condition if you have a family member with essential tremor. The main sign of a tremor is a rhythmic shaking of certain parts of your body that is uncontrolled and unintentional. Treatment for essential tremor depends on the severity of the condition. This information is not intended to replace advice given to you by your health care provider. Make sure you discuss any questions you have with your health care provider. Document Revised: 07/05/2021 Document Reviewed: 07/05/2021 Elsevier Patient Education  2024 [employer].

## 2023-12-10 NOTE — Progress Notes (Signed)
 [id number]    Provider:  Dr Lucia Gaskins Requesting Provider: Cleatis Polka., MD Primary Care Provider:  Cleatis Polka., MD  CC:  tremor and headache  HPI:  Kellie Humphrey is a 78 y.o. female here as requested by Cleatis Polka., MD for headache s/p admission. has Severe mitral valve regurgitation; PREMATURE ATRIAL CONTRACTIONS; Malignant neoplasm of lower-outer quadrant of right breast of female, estrogen receptor positive (HCC); Personal history of breast cancer; Dense breast; Tremor; Paresthesias; Varicose veins of right lower extremity with complications; Nonrheumatic mitral valve regurgitation; S/P minimally-invasive mitral valve repair; Encounter for therapeutic drug monitoring; Atrial fibrillation (HCC); Encounter for removal of sutures; Cyst of right ovary; Weight loss; Nausea without vomiting; Adnexal mass; PVC (premature ventricular contraction); Palpitations; Elevated serum creatinine; and Retinal artery occlusion, branch, left on their problem list.  Patient was recently admitted inpatient in February of this year, with discharge diagnoses of A-fib, palpitations, elevated serum creatinine and retinal artery occlusion branch left.  She had abrupt onset of visual field defect, blurring, blackening of the left lower visual field with left eye, monocular partial visual field loss.  There is a field deficit resolved.  Neurology was consulted and she was started on aspirin and Plavix.  Cardiology was consulted due to previous history of A-fib and recommended Eliquis and discontinue aspirin and Plavix.  She was discharged on Eliquis 5 mg p.o. twice daily with follow-up with EP cardiology outpatient for loop recorder.  Echocardiogram did not show any significant abnormalities.  She had postop A-fib after mitral valve repair in 2021, at that time she had cardioversion and IV amiodarone, she takes primidone at home which interacts with Eliquis they discontinued primidone.   She is following up for tremors including possibly propranolol.  She also has a history of long QT with medications.  She was also seen by neurology who diagnosed central retinal artery occlusion versus TIA.  She is on flecainide and Lopressor for PACs.  She is feeling a lot better. She did not know it is afib. She is getting the loop recorder in a few weeks. On the eliquis. Vision changes resolved. Started on Eliquis for possible afob and started on Lipitor. She was taken off of Lipitor, She had a fall in July of last year and on the right side of her head she had 2 bleeds and concussion last July. She was in the hospital in [place] for 2-3 days. Took time for the hematoma to go down. She still has throbbing in the right side of the head, she bends over and feels pressure, the area is still tender when she touched it. It is squeezing, frontotemporal where she had head trauma on the right. It is always there, she can feel it all time and is mild, not sharp or shooting. She has no history of migraines. She has had migraine aura a prism that moves across the eyes going on a long time. But the headache started after the head trauma, no light or sound sensitivity. The tremor is familial, when holding a cup or performing an action, not at rest, father had a tremor. Headache has improved since July the fall. She is on Gabapentin 600mg  twice a day for RLS and has been on it for many years. No other focal neurologic deficits, associated symptoms, inciting events or modifiable factors.    Reviewed notes, labs and imaging from outside physicians, which showed   MRI of the brain 11/21/2023: EXAM: MRI HEAD WITHOUT  CONTRAST   TECHNIQUE: Multiplanar, multiecho pulse sequences of the brain and surrounding structures were obtained without intravenous contrast.   COMPARISON:  Head CT 0143 hours today, CTA head and neck 0150 hours.   FINDINGS: Brain: No restricted diffusion to suggest acute infarction. No midline  shift, mass effect, evidence of mass lesion, ventriculomegaly, extra-axial collection or acute intracranial hemorrhage. Cervicomedullary junction and pituitary are within normal limits.   Cerebral volume is within normal limits for age.   Mild for age nonspecific T2/FLAIR heterogeneity in the cerebral white matter and the pons. However, on SWI there are multiple chronic microhemorrhages in the brainstem (series 11, image 30), and a small number of microhemorrhages scattered in both cerebral hemispheres. The extent is less than expected for amyloid angiopathy. No cortical encephalomalacia is identified.   Vascular: Major intracranial vascular flow voids are preserved.   Skull and upper cervical spine: Negative. Visualized bone marrow signal is within normal limits.   Sinuses/Orbits: Normal suprasellar cistern. Normal optic chiasm. Noncontrast cavernous sinus appears symmetric and unremarkable. Postoperative changes to both globes, otherwise negative orbits. Paranasal Visualized paranasal sinuses and mastoids are stable and well aerated.   Other: Grossly normal visible internal auditory structures. Negative visible scalp and face.   IMPRESSION: 1. No acute intracranial abnormality. 2. Chronic small vessel disease, primarily in the form of chronic microhemorrhages concentrated in the brainstem, mildly scattered in the cerebral hemispheres. The findings do not rise to the level of amyloid angiopathy.  Recent Results (from the past 2160 hours)  Ethanol     Status: None   Collection Time: 11/21/23  1:35 AM  Result Value Ref Range   Alcohol, Ethyl (B) <10 <10 mg/dL    Comment: (NOTE) Lowest detectable limit for serum alcohol is 10 mg/dL.  For medical purposes only. Performed at [facility], [address], [place], [place] [address]   Protime-INR     Status: None   Collection Time: 11/21/23  1:35 AM  Result Value Ref Range   Prothrombin Time 13.4 11.4 - 15.2 seconds    INR 1.0 0.8 - 1.2    Comment: (NOTE) INR goal varies based on device and disease states. Performed at [facility], [address], [place], [place] [address]   APTT     Status: None   Collection Time: 11/21/23  1:35 AM  Result Value Ref Range   aPTT 29 24 - 36 seconds    Comment: Performed at [facility], [address] [place], [place] [address]  CBC     Status: None   Collection Time: 11/21/23  1:35 AM  Result Value Ref Range   WBC 7.8 4.0 - 10.5 K/uL   RBC 4.74 3.87 - 5.11 MIL/uL   Hemoglobin 14.1 12.0 - 15.0 g/dL   HCT 95.6 21.3 - 08.6 %   MCV 90.7 80.0 - 100.0 fL   MCH 29.7 26.0 - 34.0 pg   MCHC 32.8 30.0 - 36.0 g/dL   RDW 57.8 46.9 - 62.9 %   Platelets 266 150 - 400 K/uL   nRBC 0.0 0.0 - 0.2 %    Comment: Performed at [facility], [address], [place], [place] [address]  Differential     Status: None   Collection Time: 11/21/23  1:35 AM  Result Value Ref Range   Neutrophils Relative % 72 %   Neutro Abs 5.6 1.7 - 7.7 K/uL   Lymphocytes Relative 14 %   Lymphs Abs 1.1  0.7 - 4.0 K/uL   Monocytes Relative 11 %   Monocytes Absolute 0.8 0.1 - 1.0 K/uL   Eosinophils Relative 2 %   Eosinophils Absolute 0.2 0.0 - 0.5 K/uL   Basophils Relative 1 %   Basophils Absolute 0.0 0.0 - 0.1 K/uL   Immature Granulocytes 0 %   Abs Immature Granulocytes 0.02 0.00 - 0.07 K/uL    Comment: Performed at [facility], [address], [place], [place] [address]  Comprehensive metabolic panel     Status: Abnormal   Collection Time: 11/21/23  1:35 AM  Result Value Ref Range   Sodium 139 135 - 145 mmol/L   Potassium 4.0 3.5 - 5.1 mmol/L   Chloride 103 98 - 111 mmol/L   CO2 23 22 - 32 mmol/L   Glucose, Bld 104 (H) 70 - 99 mg/dL    Comment: Glucose reference range applies only to samples taken after fasting for at least 8 hours.   BUN 17 8 - 23 mg/dL   Creatinine, Ser 8.65 (H) 0.44 - 1.00 mg/dL   Calcium 9.6 8.9 - 78.4 mg/dL   Total Protein 7.2 6.5 - 8.1  g/dL   Albumin 4.2 3.5 - 5.0 g/dL   AST 27 15 - 41 U/L   ALT 20 0 - 44 U/L   Alkaline Phosphatase 97 38 - 126 U/L   Total Bilirubin 0.6 0.0 - 1.2 mg/dL   GFR, Estimated 48 (L) >60 mL/min    Comment: (NOTE) Calculated using the CKD-EPI Creatinine Equation (2021)    Anion gap 13 5 - 15    Comment: Performed at [facility], [address], [place], [place] [address]  Magnesium     Status: None   Collection Time: 11/21/23  1:35 AM  Result Value Ref Range   Magnesium 2.2 1.7 - 2.4 mg/dL    Comment: Performed at [facility], [address], [place], [place] [address]  Lipid panel     Status: Abnormal   Collection Time: 11/21/23  1:35 AM  Result Value Ref Range   Cholesterol 203 (H) 0 - 200 mg/dL   Triglycerides 324 <401 mg/dL   HDL 68 >02 mg/dL   Total CHOL/HDL Ratio 3.0 RATIO   VLDL 26 0 - 40 mg/dL   LDL Cholesterol 725 (H) 0 - 99 mg/dL    Comment:        Total Cholesterol/HDL:CHD Risk Coronary Heart Disease Risk Table                     Men   Women  1/2 Average Risk   3.4   3.3  Average Risk       5.0   4.4  2 X Average Risk   9.6   7.1  3 X Average Risk  23.4   11.0        Use the calculated Patient Ratio above and the CHD Risk Table to determine the patient's CHD Risk.        ATP III CLASSIFICATION (LDL):  <100     mg/dL   Optimal  366-440  mg/dL   Near or Above                    Optimal  130-159  mg/dL   Borderline  347-425  mg/dL   High  >956     mg/dL   Very High Performed at [facility], [address]  [place], [place] [address]   Hemoglobin A1c     Status: None   Collection Time: 11/21/23  1:35 AM  Result Value Ref Range   Hgb A1c MFr Bld 5.5 4.8 - 5.6 %    Comment: (NOTE) Pre diabetes:          5.7%-6.4%  Diabetes:              >6.4%  Glycemic control for   <7.0% adults with diabetes    Mean Plasma Glucose 111.15 mg/dL    Comment: Performed at [facility], [address], [place], [place] [address]  I-stat chem 8, ED      Status: Abnormal   Collection Time: 11/21/23  1:39 AM  Result Value Ref Range   Sodium 139 135 - 145 mmol/L   Potassium 3.9 3.5 - 5.1 mmol/L   Chloride 104 98 - 111 mmol/L   BUN 20 8 - 23 mg/dL   Creatinine, Ser 1.91 (H) 0.44 - 1.00 mg/dL   Glucose, Bld 478 (H) 70 - 99 mg/dL    Comment: Glucose reference range applies only to samples taken after fasting for at least 8 hours.   Calcium, Ion 1.09 (L) 1.15 - 1.40 mmol/L   TCO2 25 22 - 32 mmol/L   Hemoglobin 15.3 (H) 12.0 - 15.0 g/dL   HCT 29.5 62.1 - 30.8 %  Urine rapid drug screen (hosp performed)     Status: None   Collection Time: 11/21/23  6:00 AM  Result Value Ref Range   Opiates NONE DETECTED NONE DETECTED   Cocaine NONE DETECTED NONE DETECTED   Benzodiazepines NONE DETECTED NONE DETECTED   Amphetamines NONE DETECTED NONE DETECTED   Tetrahydrocannabinol NONE DETECTED NONE DETECTED   Barbiturates NONE DETECTED NONE DETECTED    Comment: (NOTE) DRUG SCREEN FOR MEDICAL PURPOSES ONLY.  IF CONFIRMATION IS NEEDED FOR ANY PURPOSE, NOTIFY LAB WITHIN 5 DAYS.  LOWEST DETECTABLE LIMITS FOR URINE DRUG SCREEN Drug Class                     Cutoff (ng/mL) Amphetamine and metabolites    1000 Barbiturate and metabolites    200 Benzodiazepine                 200 Opiates and metabolites        300 Cocaine and metabolites        300 THC                            50 Performed at [facility], [address], [place], [place] [address]   Urinalysis, Routine w reflex microscopic -Urine, Clean Catch     Status: Abnormal   Collection Time: 11/21/23  6:00 AM  Result Value Ref Range   Color, Urine COLORLESS (A) YELLOW   APPearance CLEAR CLEAR   Specific Gravity, Urine 1.018 1.005 - 1.030   pH 7.0 5.0 - 8.0   Glucose, UA NEGATIVE NEGATIVE mg/dL   Hgb urine dipstick NEGATIVE NEGATIVE   Bilirubin Urine NEGATIVE NEGATIVE   Ketones, ur NEGATIVE NEGATIVE mg/dL   Protein, ur NEGATIVE NEGATIVE mg/dL   Nitrite NEGATIVE NEGATIVE    Leukocytes,Ua NEGATIVE NEGATIVE    Comment: Performed at [facility], [address] [place], [place] [address]  ECHOCARDIOGRAM COMPLETE     Status: None   Collection Time: 11/22/23  2:24 PM  Result Value Ref Range   Weight  2,127 oz   Height 67 in   BP 99/66 mmHg   S' Lateral 3.00 cm   Area-P 1/2 2.37 cm2   Est EF 55 - 60%      Review of Systems: Patient complains of symptoms per HPI as well as the following symptoms none. Pertinent negatives and positives per HPI. All others negative.   Social History   Socioeconomic History   Marital status: Married    Spouse name: Onalee Hua   Number of children: 2   Years of education: Bachelor   Highest education level: Not on file  Occupational History   Not on file  Tobacco Use   Smoking status: Never   Smokeless tobacco: Never  Vaping Use   Vaping status: Never Used  Substance and Sexual Activity   Alcohol use: Yes    Comment: Socially   Drug use: No   Sexual activity: Yes    Birth control/protection: Post-menopausal  Other Topics Concern   Not on file  Social History Narrative   Patient is married to [place], has 2 children   Patient is right handed   Education level is Bachelor   Caffeine consumption is none other than in chocolate occasionally       Social Drivers of Corporate investment banker Strain: Not on file  Food Insecurity: No Food Insecurity (11/21/2023)   Hunger Vital Sign    Worried About Running Out of Food in the Last Year: Never true    Ran Out of Food in the Last Year: Never true  Transportation Needs: No Transportation Needs (11/21/2023)   PRAPARE - Transportation    Lack of Transportation (Medical): No    Lack of Transportation (Non-Medical): No  Physical Activity: Not on file  Stress: Not on file  Social Connections: Socially Integrated (11/21/2023)   Social Connection and Isolation Panel [NHANES]    Frequency of Communication with Friends and Family: More than three times a week    Frequency  of Social Gatherings with Friends and Family: More than three times a week    Attends Religious Services: 1 to 4 times per year    Active Member of [employer] or Organizations: Yes    Attends Engineer, structural: More than 4 times per year    Marital Status: Married  Catering manager Violence: Not At Risk (11/21/2023)   Humiliation, Afraid, Rape, and Kick questionnaire    Fear of Current or Ex-Partner: No    Emotionally Abused: No    Physically Abused: No    Sexually Abused: No    Family History  Problem Relation Age of Onset   Coronary artery disease Mother        s/p MI (62s)   Hypertension Mother    Osteoporosis Mother        wrist fracture   Alzheimer's disease Mother    Osteoarthritis Father    Neuropathy Father    Tremor Father    Other Father        lymphedema   Fibromyalgia Sister    Heart disease Maternal Grandmother    Colon polyps Neg Hx    Esophageal cancer Neg Hx    Pancreatic cancer Neg Hx    Stomach cancer Neg Hx    Rectal cancer Neg Hx    Ovarian cancer Neg Hx    Breast cancer Neg Hx    Colon cancer Neg Hx    Prostate cancer Neg Hx    Endometrial cancer Neg Hx    BRCA  1/2 Neg Hx     Past Medical History:  Diagnosis Date   Anxiety disorder    Arthritis    Breast cancer (HCC) 07/25/2008   Cancer (HCC)    Cataract    Essential tremor    /familial   Heart murmur 2021   corrected with valve surgery   Hypothyroidism    Interstitial cystitis    Neuropathy    Nonrheumatic mitral valve regurgitation    PAC (premature atrial contraction)    Paresthesias 07/29/2014   Personal history of radiation therapy    PREMATURE ATRIAL CONTRACTIONS 05/08/2009   Qualifier: Diagnosis of  By: Trevor Iha, RN, [name]     Radiculopathy, cervical region    RLS (restless legs syndrome)    S/P minimally-invasive mitral valve repair 02/10/2020   Complex valvuloplasty including artificial Gore-tex neochord placement x8 with edge-to-edge suture plication of anterior  commissure and 32 mm [name] 4D ring annuloplasty via right mini thoracotomy approach   Thyroid disease    Varicose veins of right lower extremity with complications 03/25/2016    Patient Active Problem List   Diagnosis Date Noted   Elevated serum creatinine 11/21/2023   Retinal artery occlusion, branch, left 11/21/2023   Palpitations 08/05/2022   PVC (premature ventricular contraction) 06/05/2022   Adnexal mass    Cyst of right ovary 08/21/2021   Weight loss 08/21/2021   Nausea without vomiting 08/21/2021   Encounter for removal of sutures 02/23/2020   Encounter for therapeutic drug monitoring 02/20/2020   Atrial fibrillation (HCC) 02/20/2020   S/P minimally-invasive mitral valve repair 02/10/2020   Nonrheumatic mitral valve regurgitation    Varicose veins of right lower extremity with complications 03/25/2016   Paresthesias 07/29/2014   Tremor 12/08/2013   Personal history of breast cancer 09/06/2013   Dense breast 09/06/2013   Malignant neoplasm of lower-outer quadrant of right breast of female, estrogen receptor positive (HCC) 02/11/2012   Severe mitral valve regurgitation 05/08/2009   PREMATURE ATRIAL CONTRACTIONS 05/08/2009    Past Surgical History:  Procedure Laterality Date   bilateral cataract repair  july/aug 2021   Dr Darel Hong   BREAST BIOPSY  2009   BREAST LUMPECTOMY  07/25/2008   CARDIAC CATHETERIZATION  2021   DILATION AND CURETTAGE OF UTERUS     age 40   EYE SURGERY Left    Pterygium   HEMANGIOMA EXCISION Left 1990   arm   MELANOMA EXCISION Right 1980   thigh   MITRAL VALVE REPAIR Right 02/10/2020   Procedure: MINIMALLY INVASIVE MITRAL VALVE REPAIR (MVR) using Memo 4D 32 MM Mitral Valve Ring.;  Surgeon: Purcell Nails, MD;  Location: [facility] OR;  Service: Open Heart Surgery;  Laterality: Right;   NECK SURGERY  10/2009   C5-6 fusion   RIGHT/LEFT HEART CATH AND CORONARY ANGIOGRAPHY N/A 01/04/2020   Procedure: RIGHT/LEFT HEART CATH AND CORONARY ANGIOGRAPHY;   Surgeon: Kathleene Hazel, MD;  Location: [facility] INVASIVE CV LAB;  Service: Cardiovascular;  Laterality: N/A;   ROBOTIC ASSISTED BILATERAL SALPINGO OOPHERECTOMY Bilateral 10/01/2021   Procedure: XI ROBOTIC ASSISTED BILATERAL SALPINGO OOPHORECTOMY;  Surgeon: Carver Fila, MD;  Location: [facility] ORS;  Service: Gynecology;  Laterality: Bilateral;   SHOULDER ARTHROSCOPY Left 2016   spider vein treatment     TEE WITHOUT CARDIOVERSION N/A 06/30/2018   Procedure: TRANSESOPHAGEAL ECHOCARDIOGRAM (TEE);  Surgeon: Quintella Reichert, MD;  Location: [facility] ENDOSCOPY;  Service: Cardiovascular;  Laterality: N/A;   TEE WITHOUT CARDIOVERSION N/A 12/12/2019   Procedure: TRANSESOPHAGEAL ECHOCARDIOGRAM (TEE);  Surgeon: Lars Masson, MD;  Location: [facility] ENDOSCOPY;  Service: Cardiovascular;  Laterality: N/A;   TEE WITHOUT CARDIOVERSION N/A 02/10/2020   Procedure: TRANSESOPHAGEAL ECHOCARDIOGRAM (TEE);  Surgeon: Purcell Nails, MD;  Location: [facility] OR;  Service: Open Heart Surgery;  Laterality: N/A;   TONSILLECTOMY     as a child   uterine fibroids removed  2012    Current Outpatient Medications  Medication Sig Dispense Refill   acetaminophen (TYLENOL) 500 MG tablet Take 500-1,000 mg by mouth as needed for mild pain (pain score 1-3).     apixaban (ELIQUIS) 5 MG TABS tablet Take 1 tablet (5 mg total) by mouth 2 (two) times daily. 60 tablet 3   atorvastatin (LIPITOR) 80 MG tablet Take 1 tablet (80 mg total) by mouth daily. 30 tablet 3   Biotin 5000 MCG CAPS Take 5,000 mcg by mouth daily.     Cholecalciferol (VITAMIN D) 2000 units tablet Take 2,000 Units by mouth daily.     Cyanocobalamin (B-12) 5000 MCG CAPS Take 5,000 mcg by mouth once a week. Mondays     flecainide (TAMBOCOR) 50 MG tablet Take two tablets (100 mg) by mouth every morning and one and half tablets (75 mg) every evening 315 tablet 3   levothyroxine (SYNTHROID) 50 MCG tablet Take 1 tablet (50 mcg total) by mouth daily before breakfast.     melatonin  5 MG TABS Take 5 mg by mouth at bedtime.     metoprolol tartrate (LOPRESSOR) 25 MG tablet Take 1 tablet (25 mg total) by mouth 2 (two) times daily.     metroNIDAZOLE (METROCREAM) 0.75 % cream Apply 1 Application topically as needed.     SYSTANE ULTRA 0.4-0.3 % SOLN Place 1 drop into the left eye every 4 (four) hours as needed (dry/irritated eyes. (4-6 times daily)).      gabapentin (NEURONTIN) 600 MG tablet Take 1 tablet (600 mg total) by mouth 3 (three) times daily. 270 tablet 4   No current facility-administered medications for this visit.    Allergies as of 12/10/2023 - Review Complete 12/10/2023  Allergen Reaction Noted   Codeine Nausea And Vomiting 09/01/2012   Phenergan [promethazine] Other (See Comments) 09/18/2021   Betadine [povidone iodine] Other (See Comments) 10/28/2021   Oxycodone Nausea And Vomiting 09/18/2021    Vitals: BP 115/65 (BP Location: Right Arm, Patient Position: Sitting, Cuff Size: Small)   Pulse 60   Ht 5\' 7"  (1.702 m)   Wt 132 lb (59.9 kg)   BMI 20.67 kg/m  Last Weight:  Wt Readings from Last 1 Encounters:  12/10/23 132 lb (59.9 kg)   Last Height:   Ht Readings from Last 1 Encounters:  12/10/23 5\' 7"  (1.702 m)     Physical exam: Exam: Gen: NAD, conversant, well nourised, obese, well groomed                     CV: RRR, no MRG. No Carotid Bruits. No peripheral edema, warm, nontender Eyes: Conjunctivae clear without exudates or hemorrhage  Neuro: Detailed Neurologic Exam  Speech:    Speech is normal; fluent and spontaneous with normal comprehension.  Cognition:    The patient is oriented to person, place, and time;     recent and remote memory intact;     language fluent;     normal attention, concentration,     fund of knowledge Cranial Nerves:    The pupils are equal, round, and reactive to light. Pupils too small to visualize  fundi. Visual fields are full to finger confrontation. Extraocular movements are intact. Trigeminal sensation is  intact and the muscles of mastication are normal. The face is symmetric. The palate elevates in the midline. Hearing intact. Voice is normal. Shoulder shrug is normal. The tongue has normal motion without fasciculations.   Coordination: nml  Gait: nml  Motor Observation:    No asymmetry, no atrophy, low amp high freq postural and action tremor Tone:    Normal muscle tone.    Posture:    Posture is normal. normal erect    Strength:    Strength is V/V in the upper and lower limbs.      Sensation: intact to LT     Reflex Exam:  DTR's:    Deep tendon reflexes in the upper and lower extremities are normal bilaterally.   Toes:    The toes are downgoing bilaterally.   Clonus:    Clonus is absent.    Assessment/Plan:  Patient with worsening tremor after stopping her primidone due to initiation of eliquis. Also right frontotemporal tenderness/pressure in the head after a fall. The benign essential tremor gets worse as the day goes on. Worse when holding phone.   Increase Gabapentin from 2x a day to 3x a day  If Gabapentin not effective can do the following:  Can further increase the Gabapentin or change to ER at bedtime and incresae the dose She is on metoprolol could consider a change to propranolol (need to discuss with cardiology) In 6 months if off of the Eliquis could initiate primidone again Could try Lyrica MRI guided ultrasound for tremors at [facility] If significant and untreatable there is also Deep Brain Stimulation at [facility] Other medications we use, less effective but there is still evidence for Topiramate (which BTW is also a medication used in headaches/migraines) The Gabapentin is also helpful for neuropathy/nerve damage which is likely why you still have pain at the site where you hit your head but we do have other headache medications  No orders of the defined types were placed in this encounter.  Meds ordered this encounter  Medications   gabapentin  (NEURONTIN) 600 MG tablet    Sig: Take 1 tablet (600 mg total) by mouth 3 (three) times daily.    Dispense:  270 tablet    Refill:  4    Please keep on file until patient refills at this higher frequency and requests prescription to be filled. Cancel prior BID gabapentin as this new prescription is tid.    Cc: Cleatis Polka., MD,  Cleatis Polka., MD  Naomie Dean, MD  [facility] [address] [place], [place] [address]  Phone [phone] Fax [phone]

## 2023-12-15 ENCOUNTER — Ambulatory Visit: Payer: PPO | Attending: Internal Medicine | Admitting: Internal Medicine

## 2023-12-15 ENCOUNTER — Encounter: Payer: Self-pay | Admitting: Internal Medicine

## 2023-12-15 VITALS — BP 100/66 | HR 68 | Ht 67.0 in | Wt 130.4 lb

## 2023-12-15 DIAGNOSIS — R002 Palpitations: Secondary | ICD-10-CM | POA: Diagnosis not present

## 2023-12-15 MED ORDER — ATORVASTATIN CALCIUM 80 MG PO TABS: 40.0000 mg | ORAL_TABLET | Freq: Every day | ORAL | Status: DC

## 2023-12-15 NOTE — Progress Notes (Addendum)
 HPI Kellie Humphrey returns for followup. She is a pleasant 78 yo woman with MV repair s/p MV surgery who had post op afib that resolved. She has a h/o SAH after a fall. She was initially on amiodarone but then switched back to flecainide. She has continued to be symptomatic and  she had occaisional PAC's and PVC's and very brief NS SVT. She thinks that her palpitations are worse, occurring every day. When I last saw her we uptitrated her metoprolol and flecainide and she was better. No syncope. Since her last visit, she had transient loss of vision and was diagnosed with a retinal artery occlusion. Her symptoms eventually resolved. She was placed back on eliquis. She feels well and has not had much in the way of symptoms except some achy from the high dose lipitor. Allergies  Allergen Reactions   Codeine Nausea And Vomiting   Phenergan [Promethazine] Other (See Comments)    Long QT   Betadine [Povidone Iodine] Other (See Comments)    Irritation - vaginal   Oxycodone Nausea And Vomiting     Current Outpatient Medications  Medication Sig Dispense Refill   acetaminophen (TYLENOL) 500 MG tablet Take 500-1,000 mg by mouth as needed for mild pain (pain score 1-3).     apixaban (ELIQUIS) 5 MG TABS tablet Take 1 tablet (5 mg total) by mouth 2 (two) times daily. 60 tablet 3   atorvastatin (LIPITOR) 80 MG tablet Take 1 tablet (80 mg total) by mouth daily. 30 tablet 3   Biotin 5000 MCG CAPS Take 5,000 mcg by mouth daily.     Cholecalciferol (VITAMIN D) 2000 units tablet Take 2,000 Units by mouth daily.     Cyanocobalamin (B-12) 5000 MCG CAPS Take 5,000 mcg by mouth once a week. Mondays     flecainide (TAMBOCOR) 50 MG tablet Take two tablets (100 mg) by mouth every morning and one and half tablets (75 mg) every evening 315 tablet 3   gabapentin (NEURONTIN) 600 MG tablet Take 1 tablet (600 mg total) by mouth 3 (three) times daily. 270 tablet 4   levothyroxine (SYNTHROID) 50 MCG tablet Take 1 tablet  (50 mcg total) by mouth daily before breakfast.     melatonin 5 MG TABS Take 5 mg by mouth at bedtime.     metoprolol tartrate (LOPRESSOR) 25 MG tablet Take 1 tablet (25 mg total) by mouth 2 (two) times daily.     metroNIDAZOLE (METROCREAM) 0.75 % cream Apply 1 Application topically as needed.     SYSTANE ULTRA 0.4-0.3 % SOLN Place 1 drop into the left eye every 4 (four) hours as needed (dry/irritated eyes. (4-6 times daily)).      No current facility-administered medications for this visit.     Past Medical History:  Diagnosis Date   Anxiety disorder    Arthritis    Breast cancer (HCC) 07/25/2008   Cancer (HCC)    Cataract    Essential tremor    /familial   Heart murmur 2021   corrected with valve surgery   Hypothyroidism    Interstitial cystitis    Neuropathy    Nonrheumatic mitral valve regurgitation    PAC (premature atrial contraction)    Paresthesias 07/29/2014   Personal history of radiation therapy    PREMATURE ATRIAL CONTRACTIONS 05/08/2009   Qualifier: Diagnosis of  By: Trevor Iha, RN, [name]     Radiculopathy, cervical region    RLS (restless legs syndrome)    S/P minimally-invasive mitral valve repair  02/10/2020   Complex valvuloplasty including artificial Gore-tex neochord placement x8 with edge-to-edge suture plication of anterior commissure and 32 mm [name] 4D ring annuloplasty via right mini thoracotomy approach   Thyroid disease    Varicose veins of right lower extremity with complications 03/25/2016    ROS:   All systems reviewed and negative except as noted in the HPI.   Past Surgical History:  Procedure Laterality Date   bilateral cataract repair  july/aug 2021   Dr Darel Hong   BREAST BIOPSY  2009   BREAST LUMPECTOMY  07/25/2008   CARDIAC CATHETERIZATION  2021   DILATION AND CURETTAGE OF UTERUS     age 59   EYE SURGERY Left    Pterygium   HEMANGIOMA EXCISION Left 1990   arm   MELANOMA EXCISION Right 1980   thigh   MITRAL VALVE REPAIR Right  02/10/2020   Procedure: MINIMALLY INVASIVE MITRAL VALVE REPAIR (MVR) using Memo 4D 32 MM Mitral Valve Ring.;  Surgeon: Purcell Nails, MD;  Location: [facility] OR;  Service: Open Heart Surgery;  Laterality: Right;   NECK SURGERY  10/2009   C5-6 fusion   RIGHT/LEFT HEART CATH AND CORONARY ANGIOGRAPHY N/A 01/04/2020   Procedure: RIGHT/LEFT HEART CATH AND CORONARY ANGIOGRAPHY;  Surgeon: Kathleene Hazel, MD;  Location: [facility] INVASIVE CV LAB;  Service: Cardiovascular;  Laterality: N/A;   ROBOTIC ASSISTED BILATERAL SALPINGO OOPHERECTOMY Bilateral 10/01/2021   Procedure: XI ROBOTIC ASSISTED BILATERAL SALPINGO OOPHORECTOMY;  Surgeon: Carver Fila, MD;  Location: [facility] ORS;  Service: Gynecology;  Laterality: Bilateral;   SHOULDER ARTHROSCOPY Left 2016   spider vein treatment     TEE WITHOUT CARDIOVERSION N/A 06/30/2018   Procedure: TRANSESOPHAGEAL ECHOCARDIOGRAM (TEE);  Surgeon: Quintella Reichert, MD;  Location: [facility] ENDOSCOPY;  Service: Cardiovascular;  Laterality: N/A;   TEE WITHOUT CARDIOVERSION N/A 12/12/2019   Procedure: TRANSESOPHAGEAL ECHOCARDIOGRAM (TEE);  Surgeon: Lars Masson, MD;  Location: [facility] ENDOSCOPY;  Service: Cardiovascular;  Laterality: N/A;   TEE WITHOUT CARDIOVERSION N/A 02/10/2020   Procedure: TRANSESOPHAGEAL ECHOCARDIOGRAM (TEE);  Surgeon: Purcell Nails, MD;  Location: [facility] OR;  Service: Open Heart Surgery;  Laterality: N/A;   TONSILLECTOMY     as a child   uterine fibroids removed  2012     Family History  Problem Relation Age of Onset   Coronary artery disease Mother        s/p MI (62s)   Hypertension Mother    Osteoporosis Mother        wrist fracture   Alzheimer's disease Mother    Osteoarthritis Father    Neuropathy Father    Tremor Father    Other Father        lymphedema   Fibromyalgia Sister    Heart disease Maternal Grandmother    Colon polyps Neg Hx    Esophageal cancer Neg Hx    Pancreatic cancer Neg Hx    Stomach cancer Neg Hx    Rectal cancer  Neg Hx    Ovarian cancer Neg Hx    Breast cancer Neg Hx    Colon cancer Neg Hx    Prostate cancer Neg Hx    Endometrial cancer Neg Hx    BRCA 1/2 Neg Hx      Social History   Socioeconomic History   Marital status: Married    Spouse name: Kellie Humphrey   Number of children: 2   Years of education: Bachelor   Highest education level: Not on file  Occupational History  Not on file  Tobacco Use   Smoking status: Never   Smokeless tobacco: Never  Vaping Use   Vaping status: Never Used  Substance and Sexual Activity   Alcohol use: Yes    Comment: Socially   Drug use: No   Sexual activity: Yes    Birth control/protection: Post-menopausal  Other Topics Concern   Not on file  Social History Narrative   Patient is married to Kellie Humphrey, has 2 children   Patient is right handed   Education level is Bachelor   Caffeine consumption is none other than in chocolate occasionally       Social Drivers of Corporate investment banker Strain: Not on file  Food Insecurity: No Food Insecurity (11/21/2023)   Hunger Vital Sign    Worried About Running Out of Food in the Last Year: Never true    Ran Out of Food in the Last Year: Never true  Transportation Needs: No Transportation Needs (11/21/2023)   PRAPARE - Transportation    Lack of Transportation (Medical): No    Lack of Transportation (Non-Medical): No  Physical Activity: Not on file  Stress: Not on file  Social Connections: Socially Integrated (11/21/2023)   Social Connection and Isolation Panel [NHANES]    Frequency of Communication with Friends and Family: More than three times a week    Frequency of Social Gatherings with Friends and Family: More than three times a week    Attends Religious Services: 1 to 4 times per year    Active Member of [employer] or Organizations: Yes    Attends Engineer, structural: More than 4 times per year    Marital Status: Married  Catering manager Violence: Not At Risk (11/21/2023)   Humiliation,  Afraid, Rape, and Kick questionnaire    Fear of Current or Ex-Partner: No    Emotionally Abused: No    Physically Abused: No    Sexually Abused: No     BP 100/66   Pulse 68   Ht 5\' 7"  (1.702 m)   Wt 130 lb 6.4 oz (59.1 kg)   SpO2 99%   BMI 20.42 kg/m   Physical Exam:  Well appearing NAD HEENT: Unremarkable Neck:  No JVD, no thyromegally Lymphatics:  No adenopathy Back:  No CVA tenderness Lungs:  Clear with no wheezes HEART:  Regular rate rhythm, no murmurs, no rubs, no clicks Abd:  soft, positive bowel sounds, no organomegally, no rebound, no guarding Ext:  2 plus pulses, no edema, no cyanosis, no clubbing Skin:  No rashes no nodules Neuro:  CN II through XII intact, motor grossly intact  EKG - one month ago shows NSR with a QRS duration of 116.  Assess/Plan: Presumed recurrent atrial fib - as she has had post op afib, and a lot of atrial arrhythmias, I am certain that her recent embolic event was due to atrial fib. Eliquis is indicated. There is no advantage to an ILR at this point. The real issue is bleeding risk on eliquis and whether the patient is a candidate for Watchman. I'll ask her to see Dr. Jimmey Ralph.  Atrial arrhythmias - she will continue her current regimen with flecainide and a beta blocker.  Dyslipidemia - I asked her to decrease the dose of lipitor down to 40 mg daily.

## 2023-12-15 NOTE — Addendum Note (Signed)
 Addended by: Alois Cliche on: 12/15/2023 09:32 AM   Modules accepted: Orders

## 2023-12-15 NOTE — Patient Instructions (Addendum)
 Medication Instructions:  Your physician has recommended you make the following change in your medication:   ** Decrease Atorvastatin to 80mg  to 1/2 tablet (40mg )by mouth daily.   *If you need a refill on your cardiac medications before your next appointment, please call your pharmacy*   Lab Work: None ordered.  If you have labs (blood work) drawn today and your tests are completely normal, you will receive your results only by: MyChart Message (if you have MyChart) OR A paper copy in the mail If you have any lab test that is abnormal or we need to change your treatment, we will call you to review the results.   Testing/Procedures: None ordered.    Follow-Up: At [facility], you and your health needs are our priority.  As part of our continuing mission to provide you with exceptional heart care, we have created designated Provider Care Teams.  These Care Teams include your primary Cardiologist (physician) and Advanced Practice Providers (APPs -  Physician Assistants and Nurse Practitioners) who all work together to provide you with the care you need, when you need it.  We recommend signing up for the patient portal called "MyChart".  Sign up information is provided on this After Visit Summary.  MyChart is used to connect with patients for Virtual Visits (Telemedicine).  Patients are able to view lab/test results, encounter notes, upcoming appointments, etc.  Non-urgent messages can be sent to your provider as well.   To learn more about what you can do with MyChart, go to [email or web address].    Your next appointment:   Referral to Dr Jimmey Ralph to discuss Watchman

## 2023-12-17 NOTE — Progress Notes (Signed)
 Electrophysiology Office Note:   Date:  12/18/2023  ID:  Kellie Humphrey, DOB [birth year], MRN [id number]  Primary Cardiologist: Meriam Sprague, MD (Inactive) Electrophysiologist: Lewayne Bunting, MD      History of Present Illness:   Kellie Humphrey is a 78 y.o. female with h/o mitral valve repair, paroxysmal atrial fibrillation, PACs, PVCs, PSVT, and retinal artery occlusion (presumptive cardioembolic) who is being seen today for evaluation for Watchman device implant at the request of Dr. Ladona Ridgel.   Discussed the use of AI scribe software for clinical note transcription with the patient, who gave verbal consent to proceed.  History of Present Illness She has a history of atrial fibrillation, which was noted postoperatively after mitral valve repair. She experiences frequent extra heartbeats/palpitations, suspected to be related to atrial fibrillation. She is currently on Eliquis, which was started after being diagnosed with retinal artery occlusion, and flecainide to manage her condition. She has tolerated Eliquis,but has had to stop other medications due to interactions. She also has a history of a fall that resulted in an intracranial bleed. The fall was sudden, with no apparent cause, and she was unconscious for a while as a result. She does not recall the event, and there were no environmental factors like potholes or speed bumps that could have contributed. Since then, she has not experienced any further falls but reports occasional balance issues. Otherwise doing well with no new or acute complaints.   Review of systems complete and found to be negative unless listed in HPI.   EP Information / Studies Reviewed:    EKG is not ordered today. EKG from 11/21/23 reviewed which showed sinus rhythm with PR and QRS .      EKG 02/14/20: AF   Echo 11/22/23:  1. Left ventricular ejection fraction, by estimation, is 55 to 60%. The  left ventricle has normal function. The left  ventricle has no regional  wall motion abnormalities. Left ventricular diastolic function could not  be evaluated.   2. Right ventricular systolic function is normal. The right ventricular  size is normal. There is normal pulmonary artery systolic pressure. The  estimated right ventricular systolic pressure is 31.5 mmHg.   3. The mitral valve has been repaired/replaced. No evidence of mitral  valve regurgitation. The mean mitral valve gradient is 6.0 mmHg with  average heart rate of 82 bpm. There is a 32 mm [name] prosthetic  annuloplasty ring present in the mitral position. Procedure Date: 02/10/20.   4. The aortic valve is tricuspid. There is mild calcification of the  aortic valve. Aortic valve regurgitation is not visualized. No aortic  stenosis is present.   Risk Assessment/Calculations:    CHA2DS2-VASc Score = 5   This indicates a 7.2% annual risk of stroke. The patient's score is based upon: CHF History: 0 HTN History: 0 Diabetes History: 0 Stroke History: 2 Vascular Disease History: 0 Age Score: 2 Gender Score: 1             Physical Exam:   VS:  BP 104/72   Pulse 62   Ht 5\' 7"  (1.702 m)   Wt 129 lb 9.6 oz (58.8 kg)   SpO2 99%   BMI 20.30 kg/m    Wt Readings from Last 3 Encounters:  12/18/23 129 lb 9.6 oz (58.8 kg)  12/15/23 130 lb 6.4 oz (59.1 kg)  12/10/23 132 lb (59.9 kg)     GEN: Well nourished, well developed in no acute distress NECK: No JVD  CARDIAC: Normal rate, regular rhythm. RESPIRATORY:  Clear to auscultation without rales, wheezing or rhonchi  ABDOMEN: Soft, non-distended EXTREMITIES:  No edema; No deformity   ASSESSMENT AND PLAN:   I have seen Kellie Humphrey in the office today who is being considered for a Watchman left atrial appendage closure device. I believe they will benefit from this procedure given their history of atrial fibrillation, CHA2DS2-VASc score of 5 and unadjusted ischemic stroke rate of 7.2% per year. Unfortunately,  the patient is not felt to be a long term anticoagulation candidate secondary to intracranial bleed after a fall and medication interactions. The patient's chart has been reviewed and I feel that they would be a candidate for short term oral anticoagulation after Watchman implant.   It is my belief that after undergoing a LAA closure procedure, Kellie Humphrey will not need long term anticoagulation which eliminates anticoagulation side effects and major bleeding risk.   Procedural risks for the Watchman implant have been reviewed with the patient including a 0.5% risk of stroke, <1% risk of perforation and <1% risk of device embolization. Other risks include bleeding, vascular damage, tamponade, worsening renal function, and death. The patient understands these risk and wishes to proceed.     The published clinical data on the safety and effectiveness of WATCHMAN include but are not limited to the following: - Holmes DR, Everlene Farrier, Sick P et al. for the PROTECT AF Investigators. Percutaneous closure of the left atrial appendage versus warfarin therapy for prevention of stroke in patients with atrial fibrillation: a randomised non-inferiority trial. Lancet 2009; 374: 534-42. Everlene Farrier, Doshi SK, Isa Rankin D et al. on behalf of the PROTECT AF Investigators. Percutaneous Left Atrial Appendage Closure for Stroke Prophylaxis in Patients With Atrial Fibrillation 2.3-Year Follow-up of the PROTECT AF (Watchman Left Atrial Appendage System for Embolic Protection in Patients With Atrial Fibrillation) Trial. Circulation 2013; 127:720-729. - Alli O, Doshi S,  Kar S, Reddy VY, Sievert H et al. Quality of Life Assessment in the Randomized PROTECT AF (Percutaneous Closure of the Left Atrial Appendage Versus Warfarin Therapy for Prevention of Stroke in Patients With Atrial Fibrillation) Trial of Patients at Risk for Stroke With Nonvalvular Atrial Fibrillation. J Am Coll Cardiol 2013; 61:1790-8. Aline August DR,  [place], Price M, Whisenant B, Sievert H, Doshi S, Huber K, Reddy V. Prospective randomized evaluation of the Watchman left atrial appendage Device in patients with atrial fibrillation versus long-term warfarin therapy; the PREVAIL trial. Journal of the [employer] of Cardiology, Vol. 4, No. 1, 2014, 1-11. - Kar S, Doshi SK, Sadhu A, Horton R, Osorio J et al. Primary outcome evaluation of a next-generation left atrial appendage closure device: results from the PINNACLE FLX trial. Circulation 2021;143(18)1754-1762.    After today's visit with the patient which was dedicated solely for shared decision making visit regarding LAA closure device, the patient decided to proceed with the LAA appendage closure procedure scheduled to be done in the near future at [facility]. Prior to the procedure, I would like to obtain a gated CT scan of the chest with contrast timed for PV/LA visualization.   HAS-BLED score 3 Hypertension No  Abnormal renal and liver function (Dialysis, transplant, Cr >2.26 mg/dL /Cirrhosis or Bilirubin >2x Normal or AST/ALT/AP >3x Normal) No  Stroke Yes  Bleeding Yes  Labile INR (Unstable/high INR) No  Elderly (>65) Yes  Drugs or alcohol (>= 8 drinks/week, anti-plt or NSAID) No   CHA2DS2-VASc Score = 5  The patient's score is based upon: CHF History: 0 HTN History: 0 Diabetes History: 0 Stroke History: 2 Vascular Disease History: 0 Age Score: 2 Gender Score: 1       ASSESSMENT AND PLAN: Paroxysmal Atrial Fibrillation: The patient's CHA2DS2-VASc score is 5, indicating a 7.2% annual risk of stroke.  She follows with Dr. Ladona Ridgel. Continue flecainide and metoprolol.   Secondary Hypercoagulable State: The patient is at significant risk for stroke/thromboembolism based upon her CHA2DS2-VASc Score of 5.  Continue Apixaban (Eliquis).   Fall c/b intracranial bleed and ongoing balance issues:  Refer to physical therapy.   Signed, Nobie Putnam, MD

## 2023-12-18 ENCOUNTER — Ambulatory Visit: Attending: Cardiology | Admitting: Cardiology

## 2023-12-18 ENCOUNTER — Encounter: Payer: Self-pay | Admitting: Cardiology

## 2023-12-18 ENCOUNTER — Other Ambulatory Visit: Payer: Self-pay

## 2023-12-18 VITALS — BP 104/72 | HR 62 | Ht 67.0 in | Wt 129.6 lb

## 2023-12-18 DIAGNOSIS — D6869 Other thrombophilia: Secondary | ICD-10-CM | POA: Diagnosis not present

## 2023-12-18 DIAGNOSIS — I480 Paroxysmal atrial fibrillation: Secondary | ICD-10-CM | POA: Diagnosis not present

## 2023-12-18 DIAGNOSIS — W19XXXA Unspecified fall, initial encounter: Secondary | ICD-10-CM | POA: Diagnosis not present

## 2023-12-18 DIAGNOSIS — Z8679 Personal history of other diseases of the circulatory system: Secondary | ICD-10-CM | POA: Diagnosis not present

## 2023-12-18 NOTE — Patient Instructions (Addendum)
 Medication Instructions:  Your physician recommends that you continue on your current medications as directed. Please refer to the Current Medication list given to you today.  *If you need a refill on your cardiac medications before your next appointment, please call your pharmacy*   Lab Work: TODAY: BMET If you have labs (blood work) drawn today and your tests are completely normal, you will receive your results only by: MyChart Message (if you have MyChart) OR A paper copy in the mail If you have any lab test that is abnormal or we need to change your treatment, we will call you to review the results.  Testing/Procedures: Cardiac CT  Your physician has requested that you have cardiac CT. Cardiac computed tomography (CT) is a painless test that uses an x-ray machine to take clear, detailed pictures of your heart. For further information please visit [email or web address] Please follow instruction sheet as given.  Watchman  Your physician has requested that you have Left atrial appendage (LAA) closure device implantation is a procedure to put a small device in the LAA of the heart. The LAA is a small sac in the wall of the heart's left upper chamber. Blood clots can form in this area. The device, Watchman closes the LAA to help prevent a blood clot and stroke.  You will be contacted by Nurse Navigator, Karsten Fells to schedule your pre-procedure visit and procedure date. If you have any questions she can be reached at [phone].    Follow-Up: At [facility], you and your health needs are our priority.  As part of our continuing mission to provide you with exceptional heart care, we have created designated Provider Care Teams.  These Care Teams include your primary Cardiologist (physician) and Advanced Practice Providers (APPs -  Physician Assistants and Nurse Practitioners) who all work together to provide you with the care you need, when you need it.  Your next appointment:   We  will call you to schedule your follow up appointments

## 2023-12-25 DIAGNOSIS — H34232 Retinal artery branch occlusion, left eye: Secondary | ICD-10-CM | POA: Diagnosis not present

## 2023-12-29 ENCOUNTER — Telehealth: Payer: Self-pay

## 2023-12-29 NOTE — Telephone Encounter (Signed)
 The patient has several upcoming vacations scheduled. Her pre-Watchman CT is tomorrow. If her anatomy is suitable for the Watchman device, she wishes to proceed with LAAO on 01/28/2024. She understood she will be called to confirm once Dr. Jimmey Ralph reviews CT. She was grateful for call and agreed with plan.

## 2023-12-30 ENCOUNTER — Ambulatory Visit (HOSPITAL_COMMUNITY)
Admission: RE | Admit: 2023-12-30 | Discharge: 2023-12-30 | Disposition: A | Discharge status: Home / Self Care | Source: Ambulatory Visit | Attending: Cardiology | Admitting: Cardiology

## 2023-12-30 DIAGNOSIS — I480 Paroxysmal atrial fibrillation: Secondary | ICD-10-CM | POA: Insufficient documentation

## 2023-12-30 MED ORDER — IOHEXOL 350 MG/ML SOLN
95.0000 mL | Freq: Once | INTRAVENOUS | Status: AC | PRN
  Administered 2023-12-30: 95 mL via INTRAVENOUS

## 2024-01-07 ENCOUNTER — Telehealth: Payer: Self-pay

## 2024-01-07 NOTE — Telephone Encounter (Signed)
 Kellie Humphrey: Broccoli appendage Max 23.5/ AVG 22/ Depth 14 Likely use a 27mm device and TruSteer sheath Mid/Mid TSP RAO 35 CAU 33

## 2024-01-08 ENCOUNTER — Encounter: Payer: Self-pay | Admitting: Cardiology

## 2024-01-11 ENCOUNTER — Telehealth: Payer: Self-pay | Admitting: Internal Medicine

## 2024-01-11 NOTE — Telephone Encounter (Signed)
 Calling with a call report for the patient. Please advise

## 2024-01-11 NOTE — Telephone Encounter (Signed)
 Received call from [facility] from [place] to call over read. Advised plural base nodule 2 cm stable non contrasted CT chest 18 to 24 month recommended.

## 2024-01-14 ENCOUNTER — Telehealth: Payer: Self-pay

## 2024-01-14 ENCOUNTER — Other Ambulatory Visit: Payer: Self-pay

## 2024-01-14 DIAGNOSIS — I480 Paroxysmal atrial fibrillation: Secondary | ICD-10-CM

## 2024-01-14 DIAGNOSIS — Z9181 History of falling: Secondary | ICD-10-CM

## 2024-01-14 NOTE — Addendum Note (Signed)
 Addended by: Giuliano Preece A on: 01/14/2024 10:22 AM   Modules accepted: Orders

## 2024-01-14 NOTE — Telephone Encounter (Signed)
 Please schedule repeat scan in 18 months as per radiology. GT

## 2024-01-14 NOTE — Telephone Encounter (Signed)
 [name]

## 2024-01-15 ENCOUNTER — Other Ambulatory Visit: Payer: Self-pay

## 2024-01-15 DIAGNOSIS — R9389 Abnormal findings on diagnostic imaging of other specified body structures: Secondary | ICD-10-CM

## 2024-01-15 NOTE — Telephone Encounter (Signed)
 Order placed for 10/26

## 2024-01-15 NOTE — Progress Notes (Signed)
 Order for repeat CT per GT

## 2024-01-20 ENCOUNTER — Telehealth: Payer: Self-pay

## 2024-01-20 DIAGNOSIS — I480 Paroxysmal atrial fibrillation: Secondary | ICD-10-CM | POA: Diagnosis not present

## 2024-01-20 DIAGNOSIS — Z9181 History of falling: Secondary | ICD-10-CM | POA: Diagnosis not present

## 2024-01-20 LAB — CBC WITH DIFFERENTIAL/PLATELET
Basophils Absolute: 0 10*3/uL (ref 0.0–0.2)
Basos: 1 %
EOS (ABSOLUTE): 0.3 10*3/uL (ref 0.0–0.4)
Eos: 5 %
Hematocrit: 38.6 % (ref 34.0–46.6)
Hemoglobin: 12.5 g/dL (ref 11.1–15.9)
Immature Grans (Abs): 0 10*3/uL (ref 0.0–0.1)
Immature Granulocytes: 0 %
Lymphocytes Absolute: 1.4 10*3/uL (ref 0.7–3.1)
Lymphs: 22 %
MCH: 29.8 pg (ref 26.6–33.0)
MCHC: 32.4 g/dL (ref 31.5–35.7)
MCV: 92 fL (ref 79–97)
Monocytes Absolute: 0.6 10*3/uL (ref 0.1–0.9)
Monocytes: 10 %
Neutrophils Absolute: 3.9 10*3/uL (ref 1.4–7.0)
Neutrophils: 62 %
Platelets: 231 10*3/uL (ref 150–450)
RBC: 4.19 x10E6/uL (ref 3.77–5.28)
RDW: 13.4 % (ref 11.7–15.4)
WBC: 6.2 10*3/uL (ref 3.4–10.8)

## 2024-01-20 NOTE — Telephone Encounter (Signed)
 Confirmed procedure date of 01/28/2024. Confirmed arrival time of 0700 for procedure time at 0930. Reviewed pre-procedure instructions with patient. She will go to [facility] today to update BMET, CBC. Confirmed no contrast allergy and no PPM, defibrillator. The patient understands to call if questions/concerns arise prior to procedure.  She was grateful for call and agreed with plan.

## 2024-01-20 NOTE — Telephone Encounter (Signed)
 Left message to call back.  Will confirm she is going to get labs and review procedure instructions for 5/1.

## 2024-01-21 LAB — BASIC METABOLIC PANEL WITH GFR
BUN/Creatinine Ratio: 14 (ref 12–28)
BUN: 12 mg/dL (ref 8–27)
CO2: 23 mmol/L (ref 20–29)
Calcium: 9.1 mg/dL (ref 8.7–10.3)
Chloride: 105 mmol/L (ref 96–106)
Creatinine, Ser: 0.87 mg/dL (ref 0.57–1.00)
Glucose: 84 mg/dL (ref 70–99)
Potassium: 4 mmol/L (ref 3.5–5.2)
Sodium: 143 mmol/L (ref 134–144)
eGFR: 69 mL/min/{1.73_m2} (ref >59)

## 2024-01-28 ENCOUNTER — Inpatient Hospital Stay (HOSPITAL_COMMUNITY): Admitting: Anesthesiology

## 2024-01-28 ENCOUNTER — Inpatient Hospital Stay (HOSPITAL_COMMUNITY)
Admission: RE | Admit: 2024-01-28 | Discharge: 2024-01-28 | DRG: 273 | Disposition: A | Discharge status: Home / Self Care | Attending: Cardiology | Admitting: Cardiology

## 2024-01-28 ENCOUNTER — Encounter (HOSPITAL_COMMUNITY)
Admission: RE | Disposition: A | Discharge status: Home / Self Care | Payer: Self-pay | Source: Home / Self Care | Attending: Cardiology

## 2024-01-28 ENCOUNTER — Encounter (HOSPITAL_COMMUNITY): Payer: Self-pay | Admitting: Cardiology

## 2024-01-28 ENCOUNTER — Inpatient Hospital Stay (HOSPITAL_COMMUNITY)

## 2024-01-28 ENCOUNTER — Other Ambulatory Visit: Payer: Self-pay

## 2024-01-28 DIAGNOSIS — I480 Paroxysmal atrial fibrillation: Secondary | ICD-10-CM | POA: Diagnosis not present

## 2024-01-28 DIAGNOSIS — Z7902 Long term (current) use of antithrombotics/antiplatelets: Secondary | ICD-10-CM | POA: Diagnosis not present

## 2024-01-28 DIAGNOSIS — R002 Palpitations: Secondary | ICD-10-CM

## 2024-01-28 DIAGNOSIS — I471 Supraventricular tachycardia, unspecified: Secondary | ICD-10-CM | POA: Diagnosis present

## 2024-01-28 DIAGNOSIS — Z01818 Encounter for other preprocedural examination: Secondary | ICD-10-CM | POA: Diagnosis not present

## 2024-01-28 DIAGNOSIS — D6869 Other thrombophilia: Secondary | ICD-10-CM | POA: Diagnosis present

## 2024-01-28 DIAGNOSIS — Z006 Encounter for examination for normal comparison and control in clinical research program: Secondary | ICD-10-CM

## 2024-01-28 DIAGNOSIS — H349 Unspecified retinal vascular occlusion: Secondary | ICD-10-CM | POA: Diagnosis present

## 2024-01-28 DIAGNOSIS — F419 Anxiety disorder, unspecified: Secondary | ICD-10-CM | POA: Diagnosis not present

## 2024-01-28 DIAGNOSIS — I493 Ventricular premature depolarization: Principal | ICD-10-CM | POA: Diagnosis present

## 2024-01-28 DIAGNOSIS — I330 Acute and subacute infective endocarditis: Secondary | ICD-10-CM | POA: Diagnosis present

## 2024-01-28 DIAGNOSIS — Z9181 History of falling: Secondary | ICD-10-CM

## 2024-01-28 DIAGNOSIS — I4891 Unspecified atrial fibrillation: Secondary | ICD-10-CM | POA: Diagnosis not present

## 2024-01-28 DIAGNOSIS — Z8673 Personal history of transient ischemic attack (TIA), and cerebral infarction without residual deficits: Secondary | ICD-10-CM | POA: Diagnosis not present

## 2024-01-28 DIAGNOSIS — E039 Hypothyroidism, unspecified: Secondary | ICD-10-CM | POA: Diagnosis not present

## 2024-01-28 DIAGNOSIS — Z952 Presence of prosthetic heart valve: Secondary | ICD-10-CM | POA: Diagnosis not present

## 2024-01-28 HISTORY — PX: LEFT ATRIAL APPENDAGE OCCLUSION: EP1229

## 2024-01-28 HISTORY — PX: TRANSESOPHAGEAL ECHOCARDIOGRAM (CATH LAB): EP1270

## 2024-01-28 LAB — TYPE AND SCREEN
ABO/RH(D): A POS
Antibody Screen: NEGATIVE

## 2024-01-28 LAB — SURGICAL PCR SCREEN
MRSA, PCR: NEGATIVE
Staphylococcus aureus: NEGATIVE

## 2024-01-28 LAB — ECHO TEE: Area-P 1/2: 2.04 cm2

## 2024-01-28 LAB — POCT ACTIVATED CLOTTING TIME: Activated Clotting Time: 297 s

## 2024-01-28 MED ORDER — ONDANSETRON HCL 4 MG/2ML IJ SOLN
INTRAMUSCULAR | Status: AC
Start: 2024-01-28 — End: ?
  Filled 2024-01-28: qty 2

## 2024-01-28 MED ORDER — SODIUM CHLORIDE 0.9 % IV SOLN: INTRAVENOUS | Status: DC

## 2024-01-28 MED ORDER — ONDANSETRON HCL 4 MG/2ML IJ SOLN: 4.0000 mg | Freq: Four times a day (QID) | INTRAMUSCULAR | Status: DC | PRN

## 2024-01-28 MED ORDER — FENTANYL CITRATE (PF) 250 MCG/5ML IJ SOLN
INTRAMUSCULAR | Status: DC | PRN
Start: 2024-01-28 — End: 2024-01-28
  Administered 2024-01-28: 100 ug via INTRAVENOUS

## 2024-01-28 MED ORDER — SUGAMMADEX SODIUM 200 MG/2ML IV SOLN
INTRAVENOUS | Status: DC | PRN
  Administered 2024-01-28: 200 mg via INTRAVENOUS

## 2024-01-28 MED ORDER — CHLORHEXIDINE GLUCONATE 0.12 % MT SOLN: 15.0000 mL | Freq: Once | OROMUCOSAL | Status: AC

## 2024-01-28 MED ORDER — HEPARIN (PORCINE) IN NACL 2000-0.9 UNIT/L-% IV SOLN
INTRAVENOUS | Status: DC | PRN
  Administered 2024-01-28: 1000 mL

## 2024-01-28 MED ORDER — HEPARIN SODIUM (PORCINE) 1000 UNIT/ML IJ SOLN
INTRAMUSCULAR | Status: AC
  Filled 2024-01-28: qty 20

## 2024-01-28 MED ORDER — SODIUM CHLORIDE 0.9% FLUSH: 3.0000 mL | Freq: Two times a day (BID) | INTRAVENOUS | Status: DC

## 2024-01-28 MED ORDER — DEXAMETHASONE SODIUM PHOSPHATE 10 MG/ML IJ SOLN
INTRAMUSCULAR | Status: DC | PRN
  Administered 2024-01-28: 10 mg via INTRAVENOUS

## 2024-01-28 MED ORDER — HEPARIN SODIUM (PORCINE) 1000 UNIT/ML IJ SOLN
INTRAMUSCULAR | Status: DC | PRN
Start: 2024-01-28 — End: 2024-01-28
  Administered 2024-01-28: 1000 [IU] via INTRAVENOUS
  Administered 2024-01-28: 10000 [IU] via INTRAVENOUS

## 2024-01-28 MED ORDER — SODIUM CHLORIDE 0.9% FLUSH: 3.0000 mL | INTRAVENOUS | Status: DC | PRN

## 2024-01-28 MED ORDER — ONDANSETRON HCL 4 MG/2ML IJ SOLN
INTRAMUSCULAR | Status: DC | PRN
  Administered 2024-01-28: 4 mg via INTRAVENOUS

## 2024-01-28 MED ORDER — IODIXANOL 320 MG/ML IV SOLN
INTRAVENOUS | Status: DC | PRN
  Administered 2024-01-28: 15 mL

## 2024-01-28 MED ORDER — ACETAMINOPHEN 325 MG PO TABS: 650.0000 mg | ORAL_TABLET | ORAL | Status: DC | PRN

## 2024-01-28 MED ORDER — CHLORHEXIDINE GLUCONATE 0.12 % MT SOLN
OROMUCOSAL | Status: AC
Start: 2024-01-28 — End: 2024-01-28
  Administered 2024-01-28: 15 mL via OROMUCOSAL
  Filled 2024-01-28: qty 15

## 2024-01-28 MED ORDER — SODIUM CHLORIDE 0.9 % IV SOLN: 250.0000 mL | INTRAVENOUS | Status: DC | PRN

## 2024-01-28 MED ORDER — PROPOFOL 10 MG/ML IV BOLUS
INTRAVENOUS | Status: DC | PRN
Start: 2024-01-28 — End: 2024-01-28
  Administered 2024-01-28: 100 mg via INTRAVENOUS

## 2024-01-28 MED ORDER — FENTANYL CITRATE (PF) 100 MCG/2ML IJ SOLN
INTRAMUSCULAR | Status: AC
Start: 2024-01-28 — End: ?
  Filled 2024-01-28: qty 2

## 2024-01-28 MED ORDER — MIDAZOLAM HCL 2 MG/2ML IJ SOLN
INTRAMUSCULAR | Status: DC | PRN
  Administered 2024-01-28: 2 mg via INTRAVENOUS

## 2024-01-28 MED ORDER — LIDOCAINE 2% (20 MG/ML) 5 ML SYRINGE
INTRAMUSCULAR | Status: DC | PRN
  Administered 2024-01-28: 60 mg via INTRAVENOUS

## 2024-01-28 MED ORDER — APIXABAN 5 MG PO TABS
5.0000 mg | ORAL_TABLET | Freq: Once | ORAL | Status: AC
  Administered 2024-01-28: 5 mg via ORAL
  Filled 2024-01-28: qty 1

## 2024-01-28 MED ORDER — PROTAMINE SULFATE 10 MG/ML IV SOLN
INTRAVENOUS | Status: DC | PRN
  Administered 2024-01-28: 35 mg via INTRAVENOUS

## 2024-01-28 MED ORDER — CEFAZOLIN SODIUM-DEXTROSE 2-4 GM/100ML-% IV SOLN
2.0000 g | INTRAVENOUS | Status: AC
Start: 2024-01-28 — End: 2024-01-28
  Administered 2024-01-28: 2 g via INTRAVENOUS

## 2024-01-28 MED ORDER — CEFAZOLIN SODIUM-DEXTROSE 2-4 GM/100ML-% IV SOLN
INTRAVENOUS | Status: AC
  Filled 2024-01-28: qty 100

## 2024-01-28 MED ORDER — MIDAZOLAM HCL 2 MG/2ML IJ SOLN
INTRAMUSCULAR | Status: AC
  Filled 2024-01-28: qty 2

## 2024-01-28 MED ORDER — PHENYLEPHRINE 80 MCG/ML (10ML) SYRINGE FOR IV PUSH (FOR BLOOD PRESSURE SUPPORT)
PREFILLED_SYRINGE | INTRAVENOUS | Status: DC | PRN
  Administered 2024-01-28: 160 ug via INTRAVENOUS

## 2024-01-28 MED ORDER — PHENYLEPHRINE HCL-NACL 20-0.9 MG/250ML-% IV SOLN
INTRAVENOUS | Status: DC | PRN
  Administered 2024-01-28: 50 ug/min via INTRAVENOUS

## 2024-01-28 MED ORDER — ROCURONIUM BROMIDE 10 MG/ML (PF) SYRINGE
PREFILLED_SYRINGE | INTRAVENOUS | Status: DC | PRN
  Administered 2024-01-28: 50 mg via INTRAVENOUS

## 2024-01-28 NOTE — H&P (Signed)
 Electrophysiology Note:   Date:  01/28/24  ID:  Kellie Humphrey, DOB 08-17-1946, MRN [id number]   Primary Cardiologist: Sonny Dust, MD (Inactive) Electrophysiologist: Manya Sells, MD       History of Present Illness:   Kellie Humphrey is a 78 y.o. female with h/o mitral valve repair, paroxysmal atrial fibrillation, PACs, PVCs, PSVT, and retinal artery occlusion (presumptive cardioembolic) who is being seen today for evaluation for Watchman device implant at the request of Dr. Carolynne Citron.    Discussed the use of AI scribe software for clinical note transcription with the patient, who gave verbal consent to proceed.   History of Present Illness She has a history of atrial fibrillation, which was noted postoperatively after mitral valve repair. She experiences frequent extra heartbeats/palpitations, suspected to be related to atrial fibrillation. She is currently on Eliquis , which was started after being diagnosed with retinal artery occlusion, and flecainide  to manage her condition. She has tolerated Eliquis ,but has had to stop other medications due to interactions. She also has a history of a fall that resulted in an intracranial bleed. The fall was sudden, with no apparent cause, and she was unconscious for a while as a result. She does not recall the event, and there were no environmental factors like potholes or speed bumps that could have contributed. Since then, she has not experienced any further falls but reports occasional balance issues. Otherwise doing well with no new or acute complaints.    Interval: Patient presents today for scheduled Watchman implant. She reports doing relatively well. No new or acute complaints.   Review of systems complete and found to be negative unless listed in HPI.    EP Information / Studies Reviewed:     EKG from 11/21/23 reviewed which showed sinus rhythm with PR and QRS .       EKG 02/14/20: AF    Echo 11/22/23:  1. Left  ventricular ejection fraction, by estimation, is 55 to 60%. The  left ventricle has normal function. The left ventricle has no regional  wall motion abnormalities. Left ventricular diastolic function could not  be evaluated.   2. Right ventricular systolic function is normal. The right ventricular  size is normal. There is normal pulmonary artery systolic pressure. The  estimated right ventricular systolic pressure is 31.5 mmHg.   3. The mitral valve has been repaired/replaced. No evidence of mitral  valve regurgitation. The mean mitral valve gradient is 6.0 mmHg with  average heart rate of 82 bpm. There is a 32 mm [name] prosthetic  annuloplasty ring present in the mitral position. Procedure Date: 02/10/20.   4. The aortic valve is tricuspid. There is mild calcification of the  aortic valve. Aortic valve regurgitation is not visualized. No aortic  stenosis is present.    Risk Assessment/Calculations:     CHA2DS2-VASc Score = 5   This indicates a 7.2% annual risk of stroke. The patient's score is based upon: CHF History: 0 HTN History: 0 Diabetes History: 0 Stroke History: 2 Vascular Disease History: 0 Age Score: 2 Gender Score: 1               Physical Exam:    Today's Vitals   01/28/24 0718 01/28/24 0735  BP: 130/67   Pulse: 64   Resp: 18   Temp: 98 F (36.7 C)   TempSrc: Oral   SpO2: 98%   Weight: 59 kg   Height: 5\' 7"  (1.702 m)   PainSc:  0-No pain  Body mass index is 20.36 kg/m.   GEN: Well nourished, well developed in no acute distress NECK: No JVD CARDIAC: Normal rate, regular rhythm. RESPIRATORY:  Clear to auscultation without rales, wheezing or rhonchi  ABDOMEN: Soft, non-distended EXTREMITIES:  No edema; No deformity    ASSESSMENT AND PLAN:   I have seen Kellie Humphrey in the office today who is being considered for a Watchman left atrial appendage closure device. I believe they will benefit from this procedure given their history of atrial  fibrillation, CHA2DS2-VASc score of 5 and unadjusted ischemic stroke rate of 7.2% per year. Unfortunately, the patient is not felt to be a long term anticoagulation candidate secondary to intracranial bleed after a fall and medication interactions. The patient's chart has been reviewed and I feel that they would be a candidate for short term oral anticoagulation after Watchman implant.    It is my belief that after undergoing a LAA closure procedure, Kellie Humphrey will not need long term anticoagulation which eliminates anticoagulation side effects and major bleeding risk.    Procedural risks for the Watchman implant have been reviewed with the patient including a 0.5% risk of stroke, <1% risk of perforation and <1% risk of device embolization. Other risks include bleeding, vascular damage, tamponade, worsening renal function, and death. The patient understands these risk and wishes to proceed.       The published clinical data on the safety and effectiveness of WATCHMAN include but are not limited to the following: - Holmes DR, Evalene Hilda, Sick P et al. for the PROTECT AF Investigators. Percutaneous closure of the left atrial appendage versus warfarin therapy for prevention of stroke in patients with atrial fibrillation: a randomised non-inferiority trial. Lancet 2009; 374: 534-42. Evalene Hilda, Doshi SK, Deloria Fetch D et al. on behalf of the PROTECT AF Investigators. Percutaneous Left Atrial Appendage Closure for Stroke Prophylaxis in Patients With Atrial Fibrillation 2.3-Year Follow-up of the PROTECT AF (Watchman Left Atrial Appendage System for Embolic Protection in Patients With Atrial Fibrillation) Trial. Circulation 2013; 127:720-729. - Alli O, Doshi S,  Kar S, Reddy VY, Sievert H et al. Quality of Life Assessment in the Randomized PROTECT AF (Percutaneous Closure of the Left Atrial Appendage Versus Warfarin Therapy for Prevention of Stroke in Patients With Atrial Fibrillation) Trial of  Patients at Risk for Stroke With Nonvalvular Atrial Fibrillation. J Am Coll Cardiol 2013; 61:1790-8. Bartholome Ligas DR, Mario Sicard, Price M, Whisenant B, Sievert H, Doshi S, Huber K, Reddy V. Prospective randomized evaluation of the Watchman left atrial appendage Device in patients with atrial fibrillation versus long-term warfarin therapy; the PREVAIL trial. Journal of the [employer] of Cardiology, Vol. 4, No. 1, 2014, 1-11. - Kar S, Doshi SK, Sadhu A, Horton R, Osorio J et al. Primary outcome evaluation of a next-generation left atrial appendage closure device: results from the PINNACLE FLX trial. Circulation 2021;143(18)1754-1762.    HAS-BLED score 3 Hypertension No  Abnormal renal and liver function (Dialysis, transplant, Cr >2.26 mg/dL /Cirrhosis or Bilirubin >2x Normal or AST/ALT/AP >3x Normal) No  Stroke Yes  Bleeding Yes  Labile INR (Unstable/high INR) No  Elderly (>65) Yes  Drugs or alcohol  (>= 8 drinks/week, anti-plt or NSAID) No    CHA2DS2-VASc Score = 5  The patient's score is based upon: CHF History: 0 HTN History: 0 Diabetes History: 0 Stroke History: 2 Vascular Disease History: 0 Age Score: 2 Gender Score: 1         ASSESSMENT AND PLAN:  Paroxysmal Atrial Fibrillation: The patient's CHA2DS2-VASc score is 5, indicating a 7.2% annual risk of stroke.  She follows with Dr. Carolynne Citron. Continue flecainide  and metoprolol .    Secondary Hypercoagulable State: The patient is at significant risk for stroke/thromboembolism based upon her CHA2DS2-VASc Score of 5.  Continue Apixaban  (Eliquis ).     Proceed with Watchman implant today as planned.    Signed, Ardeen Kohler, MD

## 2024-01-28 NOTE — Anesthesia Preprocedure Evaluation (Signed)
 Anesthesia Evaluation  Patient identified by MRN, date of birth, ID band Patient awake    Reviewed: Allergy & Precautions, H&P , NPO status , Patient's Chart, lab work & pertinent test results  Airway Mallampati: II   Neck ROM: full    Dental   Pulmonary neg pulmonary ROS   breath sounds clear to auscultation       Cardiovascular + Valvular Problems/Murmurs  Rhythm:regular Rate:Normal  S/p mitral valve replacement.   Neuro/Psych  PSYCHIATRIC DISORDERS Anxiety        GI/Hepatic   Endo/Other  Hypothyroidism    Renal/GU      Musculoskeletal  (+) Arthritis ,    Abdominal   Peds  Hematology   Anesthesia Other Findings   Reproductive/Obstetrics                             Anesthesia Physical Anesthesia Plan  ASA: 3  Anesthesia Plan: General   Post-op Pain Management:    Induction: Intravenous  PONV Risk Score and Plan: 3 and Ondansetron , Dexamethasone  and Treatment may vary due to age or medical condition  Airway Management Planned: Oral ETT  Additional Equipment: ClearSight  Intra-op Plan:   Post-operative Plan:   Informed Consent: I have reviewed the patients History and Physical, chart, labs and discussed the procedure including the risks, benefits and alternatives for the proposed anesthesia with the patient or authorized representative who has indicated his/her understanding and acceptance.     Dental advisory given  Plan Discussed with: CRNA, Anesthesiologist and Surgeon  Anesthesia Plan Comments:        Anesthesia Quick Evaluation

## 2024-01-28 NOTE — Transfer of Care (Signed)
 Immediate Anesthesia Transfer of Care Note  Patient: Kellie Humphrey  Procedure(s) Performed: LEFT ATRIAL APPENDAGE OCCLUSION TRANSESOPHAGEAL ECHOCARDIOGRAM  Patient Location: PACU  Anesthesia Type:General  Level of Consciousness: drowsy  Airway & Oxygen Therapy: Patient Spontanous Breathing and Patient connected to face mask oxygen  Post-op Assessment: Report given to RN and Post -op Vital signs reviewed and stable  Post vital signs: Reviewed and stable  Last Vitals:  Vitals Value Taken Time  BP    Temp    Pulse 51 01/28/24 1307  Resp 10 01/28/24 1307  SpO2 100 % 01/28/24 1307  Vitals shown include unfiled device data.  Last Pain:  Vitals:   01/28/24 0735  TempSrc:   PainSc: 0-No pain         Complications: There were no known notable events for this encounter.

## 2024-01-28 NOTE — Anesthesia Procedure Notes (Signed)
 Procedure Name: Intubation Date/Time: 01/28/2024 11:38 AM  Performed by: Artemisa Bile D, CRNAPre-anesthesia Checklist: Patient identified, Emergency Drugs available, Suction available and Patient being monitored Patient Re-evaluated:Patient Re-evaluated prior to induction Oxygen Delivery Method: Circle System Utilized Preoxygenation: Pre-oxygenation with 100% oxygen Induction Type: IV induction Ventilation: Mask ventilation without difficulty Laryngoscope Size: Mac and 3 Grade View: Grade II Tube type: Oral Tube size: 7.0 mm Number of attempts: 1 Airway Equipment and Method: Stylet and Oral airway Placement Confirmation: ETT inserted through vocal cords under direct vision, positive ETCO2 and breath sounds checked- equal and bilateral Secured at: 21 cm Tube secured with: Tape Dental Injury: Teeth and Oropharynx as per pre-operative assessment

## 2024-01-28 NOTE — Discharge Instructions (Signed)
 WATCHMANT Procedure, Care After  Procedure MD: Dr. Alene Mires Clinical Coordinator: Karsten Fells, RN  This sheet gives you information about how to care for yourself after your procedure. Your health care provider may also give you more specific instructions. If you have problems or questions, contact your health care provider.  What can I expect after the procedure? After the procedure, it is common to have: Bruising around your puncture site. Tenderness around your puncture site. Tiredness (fatigue).  Medication instructions It is very important to continue to take your blood thinner as directed by your doctor after the Watchman procedure. Call your procedure doctor's office with question or concerns. If you are on Coumadin (warfarin), you will have your INR checked the week after your procedure, with a goal INR of 2.0 - 3.0. Please follow your medication instructions on your discharge summary. Only take the medications listed on your discharge paperwork.  Follow up You will be seen in 6 weeks after your procedure You will have a repeat CT scan or Echocardiogram approximately 8 weeks after your procedure mark to check your device You will follow up the MD/APP who performed your procedure 6 months after your procedure The Watchman Clinical Coordinator will check in with you from time to time, including 1 and 2 years after your procedure.  NO DENTAL CLEANINGS FOR 45 days. After that, you will require antibiotics for dental procedures the first 6 months.   Follow these instructions at home: Puncture site care  Follow instructions from your health care provider about how to take care of your puncture site. Make sure you: If present, leave stitches (sutures), skin glue, or adhesive strips in place.  If a large square bandage is present, this may be removed 24 hours after surgery.  Check your puncture site every day for signs of infection. Check for: Redness, swelling, or pain. Fluid  or blood. If your puncture site starts to bleed, lie down on your back, apply firm pressure to the area, and contact your health care provider. Warmth. Pus or a bad smell. Driving Do not drive yourself home if you received sedation Do not drive for at least 4 days after your procedure or however long your health care provider recommends. (Do not resume driving if you have previously been instructed not to drive for other health reasons.) Do not spend greater than 1 hour at a time in a car for the first 3 days. Stop and take a break with a 5 minute walk at least every hour.  Do not drive or use heavy machinery while taking prescription pain medicine.  Activity Avoid activities that take a lot of effort, including exercise, for at least 7 days after your procedure. For the first 3 days, avoid sitting for longer than one hour at a time.  Avoid alcoholic beverages, signing paperwork, or participating in legal proceedings for 24 hours after receiving sedation Do not lift anything that is heavier than 10 lb (4.5 kg) for one week.  No sexual activity for 1 week.  Return to your normal activities as told by your health care provider. Ask your health care provider what activities are safe for you. General instructions Take over-the-counter and prescription medicines only as told by your health care provider. Do not use any products that contain nicotine or tobacco, such as cigarettes and e-cigarettes. If you need help quitting, ask your health care provider. You may shower after 24 hours, but Do not take baths, swim, or use a hot tub for  1 week.  Do not drink alcohol for 24 hours after your procedure. Keep all follow-up visits as told by your health care provider. This is important. Dental Work: You will require antibiotics prior to any dental work, including cleanings, for 6 months after your Watchman implantation to help protect you from infection. After 6 months, antibiotics are no longer  required. Contact a health care provider if: You have redness, mild swelling, or pain around your puncture site. You have soreness in your throat or at your puncture site that does not improve after several days You have fluid or blood coming from your puncture site that stops after applying firm pressure to the area. Your puncture site feels warm to the touch. You have pus or a bad smell coming from your puncture site. You have a fever. You have chest pain or discomfort that spreads to your neck, jaw, or arm. You are sweating a lot. You feel nauseous. You have a fast or irregular heartbeat. You have shortness of breath. You are dizzy or light-headed and feel the need to lie down. You have pain or numbness in the arm or leg closest to your puncture site. Get help right away if: Your puncture site suddenly swells. Your puncture site is bleeding and the bleeding does not stop after applying firm pressure to the area. These symptoms may represent a serious problem that is an emergency. Do not wait to see if the symptoms will go away. Get medical help right away. Call your local emergency services (911 in the [place] Do not drive yourself to the hospital. Summary After the procedure, it is normal to have bruising and tenderness at the puncture site in your groin, neck, or forearm. Check your puncture site every day for signs of infection. Get help right away if your puncture site is bleeding and the bleeding does not stop after applying firm pressure to the area. This is a medical emergency.  This information is not intended to replace advice given to you by your health care provider. Make sure you discuss any questions you have with your health care provider.

## 2024-01-28 NOTE — Plan of Care (Signed)
  Problem: Education: Goal: Knowledge of cardiac device and self-care will improve Outcome: Progressing Goal: Ability to safely manage health related needs after discharge will improve Outcome: Progressing   Problem: Education: Goal: Knowledge of General Education information will improve Description: Including pain rating scale, medication(s)/side effects and non-pharmacologic comfort measures Outcome: Progressing   Problem: Clinical Measurements: Goal: Ability to maintain clinical measurements within normal limits will improve Outcome: Progressing   Problem: Pain Managment: Goal: General experience of comfort will improve and/or be controlled Outcome: Progressing

## 2024-01-28 NOTE — Discharge Summary (Addendum)
 Electrophysiology Discharge Summary   Patient ID: Kellie Humphrey,  MRN: [id number], DOB/AGE: Oct 23, 1945 78 y.o.  Admit date: 01/28/2024 Discharge date: 01/28/2024  Primary Care Physician: Jeannine Milroy., MD  Primary Cardiologist: Sonny Dust, MD (Inactive)  Electrophysiologist: Manya Sells, MD   Primary Discharge Diagnosis:  Paroxysmal Atrial Fibrillation Poor candidacy for long term anticoagulation due to hx of fall resulting on intracranial hemorrhage,  and medication interactions CHA2DS2Vasc is 5, on Elquis   Secondary Discharge Diagnosis:  VHD Hx of MV repair Retinal artery occlusion (Presumptive cardio embolic) 3.    SVT 4.  PACs/PVCs  Procedures This Admission:  Transeptal Puncture Intra-procedural TEE which showed no LAA thrombus Left atrial appendage occlusive device placement on 01/28/24 by Dr. Daneil Dunker.  CONCLUSIONS:  1.Successful implantation of a WATCHMAN left atrial appendage occlusive device    2. TEE demonstrating no LAA thrombus 3. No early apparent complications.    Post Implant Anticoagulation Strategy: Continue Eliquis  5mg  twice daily for 45 days, then transition to dual anti-platelet therapy with aspirin  81mg  and clopidogrel  75mg  once daily for 6 months.  Brief HPI: Kellie Humphrey is a 78 y.o. female with a history of Paroxysmal Atrial Fibrillation who was referred to Electrophysiology in the outpatient setting    Hospital Course:  The patient was admitted and underwent left atrial appendage occlusive device placement as above.  The patient was monitored in the post procedure setting and has done very well with no concerns. Given this, he/she is being considered for same day discharge later today. Groin site has been stable without evidence of hematoma or bleeding. Wound care and restrictions were reviewed with the patient.   The patient has been scheduled for post procedure follow up with EP APP in approximately 6 weeks. They will  restart Eliquis  this evening and continue for 45 days then stop. At that time she will transition to Plavix  75mg  daily to complete 6 months of therapy. They will require dental SBE for 6 month post op and should refrain from dental work or cleanings for the first 45 days post implant. SBE to be RXd at follow up.   A repeat CT scan will be performed in approximately 60 days to ensure proper seal of the device.    Physical Exam: Vitals:   01/28/24 1345 01/28/24 1400 01/28/24 1451 01/28/24 1613  BP: 103/70 105/69 122/69 109/60  Pulse: (!) 54 (!) 54 60 67  Resp: (!) 21 16 14 18   Temp: (!) 97.5 F (36.4 C)  97.9 F (36.6 C) 97.6 F (36.4 C)  TempSrc: Temporal  Oral Oral  SpO2: 95% 96% 95% 97%  Weight:      Height:        GEN: Well nourished, well developed in no acute distress NECK: No JVD; No carotid bruits CARDIAC: Regular rate and rhythm, no murmurs, rubs, gallops RESPIRATORY:  Clear to auscultation without rales, wheezing or rhonchi  ABDOMEN: Soft, non-tender, non-distended EXTREMITIES:  No edema; No deformity. Groin site Stable     Discharge Medications:  Allergies as of 01/28/2024       Reactions   Codeine Nausea And Vomiting   Phenergan  [promethazine ] Other (See Comments)   Long QT   Betadine [povidone Iodine ] Other (See Comments)   Irritation - vaginal   Oxycodone  Nausea And Vomiting        Medication List     TAKE these medications    acetaminophen  500 MG tablet Commonly known as: TYLENOL  Take 500-1,000 mg  by mouth as needed for mild pain (pain score 1-3).   apixaban  5 MG Tabs tablet Commonly known as: ELIQUIS  Take 1 tablet (5 mg total) by mouth 2 (two) times daily. Notes to patient: Take your evening dose tonight about midnight or so as discussed   atorvastatin  80 MG tablet Commonly known as: LIPITOR Take 0.5 tablets (40 mg total) by mouth daily.   B-12 5000 MCG Caps Take 5,000 mcg by mouth every Monday.   Biotin 5000 MCG Caps Take 10,000 mcg by  mouth daily.   Boniva 150 MG tablet Generic drug: ibandronate Take 150 mg by mouth every 30 (thirty) days.   famotidine  20 MG tablet Commonly known as: PEPCID  Take 20 mg by mouth every evening.   flecainide  50 MG tablet Commonly known as: TAMBOCOR  Take two tablets (100 mg) by mouth every morning and one and half tablets (75 mg) every evening   gabapentin  600 MG tablet Commonly known as: NEURONTIN  Take 1 tablet (600 mg total) by mouth 3 (three) times daily. What changed:  when to take this additional instructions   levothyroxine  50 MCG tablet Commonly known as: SYNTHROID  Take 1 tablet (50 mcg total) by mouth daily before breakfast.   melatonin 3 MG Tabs tablet Take 3 mg by mouth at bedtime.   metoprolol  tartrate 25 MG tablet Commonly known as: LOPRESSOR  Take 1 tablet (25 mg total) by mouth 2 (two) times daily.   metroNIDAZOLE 0.75 % cream Commonly known as: METROCREAM Apply 1 Application topically as needed (rash).   Systane Ultra 0.4-0.3 % Soln Generic drug: Polyethyl Glycol-Propyl Glycol Place 1 drop into the left eye every 4 (four) hours as needed (dry/irritated eyes. (4-6 times daily)).   Tums Ultra 1000 1000 MG chewable tablet Generic drug: calcium  elemental as carbonate Chew 1,000 mg by mouth daily as needed for heartburn.   Vitamin D  50 MCG (2000 UT) tablet Take 2,000 Units by mouth daily.        Disposition:  Home with usual follow up as in AVS  Duration of Discharge Encounter:  APP Time: 10 min  Signed, Debbie Fails, PA-C  01/28/2024 4:22 PM   I have seen, examined the patient, and reviewed the above assessment and plan.    Hospital Course: Patient presented for scheduled Watchman device implant for persistent atrial fibrillation and history of fall with intracranial bleed.  She underwent successful implantation of a 27 mm Watchman Flex Pro device.  She underwent usual postop monitoring without acute complication and was discharged  home.  General: Well developed, in no acute distress.  Neck: No JVD.  Cardiac: Bradycardic, regular rhythm.  Resp: Normal work of breathing.  Groin: R groin soft without hematoma or bleeding.  Ext: No edema.  Neuro: No gross focal deficits.  Psych: Normal affect.   Plan:  -Patient will continue on metoprolol  and flecainide  for management of her atrial fibrillation. -Continue Eliquis  5 mg twice daily for 45 days, then transition to dual antiplatelet therapy with aspirin  81 mg and clopidogrel  75 mg once daily. -Repeat CT scan to assess position of Watchman device and for leak/thrombus at 60 days.  Duration of Discharge Encounter: 40 minues MD Time: 30 min  Ardeen Kohler, MD 01/28/2024 4:48 PM

## 2024-01-29 ENCOUNTER — Telehealth: Payer: Self-pay

## 2024-01-29 ENCOUNTER — Encounter (HOSPITAL_COMMUNITY): Payer: Self-pay | Admitting: Cardiology

## 2024-01-29 ENCOUNTER — Telehealth: Payer: Self-pay | Admitting: Cardiology

## 2024-01-29 DIAGNOSIS — Z95818 Presence of other cardiac implants and grafts: Secondary | ICD-10-CM

## 2024-01-29 DIAGNOSIS — I480 Paroxysmal atrial fibrillation: Secondary | ICD-10-CM

## 2024-01-29 MED FILL — Fentanyl Citrate Preservative Free (PF) Inj 100 MCG/2ML: INTRAMUSCULAR | Qty: 2 | Status: AC

## 2024-01-29 NOTE — Telephone Encounter (Signed)
 The patient reports her groin site is not painful, but the blood spot on the gauze grew a little overnight. It has not gotten larger since getting out of bed today. The blood spot covers about 2/3 of the gauze. There is no swelling or active bleeding. She has not removed the guaze yet. Out of an abundance of caution, she will take it easy for a couple more hours and remove her dressing at that time to shower. She will call the office if swelling or more bleeding occurs.

## 2024-01-29 NOTE — Telephone Encounter (Signed)
 Patient identification verified by 2 forms.   Called and spoke to patient  Patient states:  -Instructions to only take blood thinner last night.  -Metoprolol  slows heart rate, was awake last night. -hr in 90s last night -Incision question: bleeding at site, wants to know if she should remove it before her shower.   Patient denies:  -Pain   Interventions/Plan: -Plans to take shower as instructed but also waiting for provider/nurse response.   Reviewed ED warning signs/precautions  Patient agrees with plan, no questions at this time

## 2024-01-29 NOTE — Addendum Note (Signed)
 Addended by: Nechemia Chiappetta A on: 01/29/2024 12:46 PM   Modules accepted: Orders

## 2024-01-29 NOTE — Telephone Encounter (Signed)
  [facility]   Watchman Team  Contacted the patient regarding discharge from [facility] on 01/28/2024  The patient understands to follow up with Mertha Abrahams on 03/16/2024. She also has follow-up with Dr. Lorie Rook 02/24/2024.  The patient understands discharge instructions? Yes  The patient understands medications and regimen? Yes   The patient reports her groin site is not painful, but the blood spot on the gauze grew a little overnight. It has not gotten larger since getting out of bed today. The blood spot covers about 2/3 of the gauze. There is no swelling or active bleeding. She has not removed the guaze yet. Out of an abundance of caution, she will take it easy for a couple more hours and remove her dressing at that time to shower. She will call the office if swelling or more bleeding occurs.   The patient understands to call with any questions or concerns prior to scheduled visit.

## 2024-01-29 NOTE — Telephone Encounter (Signed)
 Pt had watchman surgery on yesterday and has questions, requesting cb

## 2024-01-29 NOTE — Anesthesia Postprocedure Evaluation (Signed)
 Anesthesia Post Note  Patient: Kellie Humphrey  Procedure(s) Performed: LEFT ATRIAL APPENDAGE OCCLUSION TRANSESOPHAGEAL ECHOCARDIOGRAM     Patient location during evaluation: PACU Anesthesia Type: General Level of consciousness: awake and alert Pain management: pain level controlled Vital Signs Assessment: post-procedure vital signs reviewed and stable Respiratory status: spontaneous breathing, nonlabored ventilation, respiratory function stable and patient connected to nasal cannula oxygen Cardiovascular status: blood pressure returned to baseline and stable Postop Assessment: no apparent nausea or vomiting Anesthetic complications: no   There were no known notable events for this encounter.  Last Vitals:  Vitals:   01/28/24 1451 01/28/24 1613  BP: 122/69 109/60  Pulse: 60 67  Resp: 14 18  Temp: 36.6 C 36.4 C  SpO2: 95% 97%    Last Pain:  Vitals:   01/28/24 1613  TempSrc: Oral  PainSc:                  Francyne Arreaga S

## 2024-01-31 ENCOUNTER — Encounter: Payer: Self-pay | Admitting: Internal Medicine

## 2024-02-21 NOTE — Progress Notes (Unsigned)
 Cardiology Office Note:   Date:  02/24/2024  ID:  Kellie Humphrey, DOB 1946/03/06, MRN [id number] PCP:  Jeannine Milroy., MD  [facility] Providers Cardiologist:  Alyssa Backbone, MD Referring MD: Jeannine Milroy., MD  Chief Complaint/Reason for Referral:  F/U AF, MR ASSESSMENT:    1. PAF (paroxysmal atrial fibrillation) (HCC)   2. Secondary hypercoagulable state (HCC)   3. Presence of Watchman left atrial appendage closure device   4. S/P minimally-invasive mitral valve repair   5. Coronary artery calcification seen on CAT scan   6. Hyperlipidemia LDL goal <70   7. CKD (chronic kidney disease) stage 2, GFR 60-89 ml/min     PLAN:   In order of problems listed above: PAF: Now status post watchman given history of falls and subdural hematoma.  Had Watchman placed May 1.  Per discharge instructions will be on Eliquis  for 45 days then transition to dual antiplatelet therapy.  This is being managed by EP.  Continue Eliquis  5 mg twice daily, flecainide  100 mg daily, Crestor 25 mg daily.  He is in sinus rhythm today per exam. Secondary hypercoagulable state: Continue Eliquis  5 mg twice daily for 45 days per EP. Status post watchman: No recurrent falls.  The circumstances of her fall are unclear.  She denies any palpitations, chest pain, shortness of breath prior to the episode.: Continue Eliquis  5 mg twice daily for 45 days with plans to dual antiplatelet therapy. Status post mitral valve repair: Most recent echocardiogram demonstrated no significant mitral regurgitation with a normal ejection fraction. Coronary artery calcification: Continue Eliquis  5 mg twice daily with plan to change to DAPT in future as above.  Continue atorvastatin  80 mg. Hyperlipidemia: LDL was 45 recently. CKD II: Consider ARB in future if BP becomes an issue.            Dispo:  Return in about 6 months (around 08/26/2024).      Medication Adjustments/Labs and Tests Ordered: Current medicines are  reviewed at length with the patient today.  Concerns regarding medicines are outlined above.  The following changes have been made:  no change   Labs/tests ordered: No orders of the defined types were placed in this encounter.   Medication Changes: No orders of the defined types were placed in this encounter.   Current medicines are reviewed at length with the patient today.  The patient does not have concerns regarding medicines.  I spent 35 minutes reviewing all clinical data during and prior to this visit including all relevant imaging studies, laboratories, clinical information from other health systems and prior notes from both Cardiology and other specialties, interviewing the patient, conducting a complete physical examination, and coordinating care in order to formulate a comprehensive and personalized evaluation and treatment plan.   History of Present Illness:    FOCUSED PROBLEM LIST:   MR (Barlow's) 32mm Sorin 4D ring annuloplasty + neochord x 8; 2021 MG 6, EF 55-60% TTE 2025 Retinal artery occlusion Feb 2025 Discharged on Eliquis  >> AF AF Post op after MV repair Had been on Flecainide  but not on DOAC Retinal artery occlusion >> Eliquis  Feb 2025 History of SDH >> Watchman May 2025 CV2 5 SDH 2/2 fall Breast cancer S/p lumpectomy Coronary artery ca++ Calcium  score CT 2022 Normal coronary angiography prior to mitral valve surgery 2021 Hyperlipidemia CKD II BMI 16 Feb 2024:  Patient consents to use of AI scribe.  She experiences palpitations daily, which have been ongoing for years. She  is aware of them but notes they have improved recently. She is on metoprolol  25 mg twice daily and occasionally takes an extra dose if needed, though not recently. She experiences lightheadedness and breathlessness when walking one to two miles a day, especially uphill, but these symptoms resolve with rest.  She has a history of breast cancer treated with lumpectomy. She is on a  statin for cholesterol management and reports no issues with the medication, such as joint or muscle aches. Her cholesterol was checked recently and was within normal limits.     Current Medications: Current Meds  Medication Sig   acetaminophen  (TYLENOL ) 500 MG tablet Take 500-1,000 mg by mouth as needed for mild pain (pain score 1-3).   apixaban  (ELIQUIS ) 5 MG TABS tablet Take 1 tablet (5 mg total) by mouth 2 (two) times daily.   atorvastatin  (LIPITOR) 80 MG tablet Take 0.5 tablets (40 mg total) by mouth daily.   Biotin 5000 MCG CAPS Take 10,000 mcg by mouth daily.   BONIVA 150 MG tablet Take 150 mg by mouth every 30 (thirty) days.   calcium  elemental as carbonate (TUMS ULTRA 1000) 400 MG chewable tablet Chew 1,000 mg by mouth daily as needed for heartburn.   Cholecalciferol (VITAMIN D ) 2000 units tablet Take 2,000 Units by mouth daily.   Cyanocobalamin (B-12) 5000 MCG CAPS Take 5,000 mcg by mouth every Monday.   famotidine  (PEPCID ) 20 MG tablet Take 20 mg by mouth every evening.   flecainide  (TAMBOCOR ) 50 MG tablet Take two tablets (100 mg) by mouth every morning and one and half tablets (75 mg) every evening   gabapentin  (NEURONTIN ) 600 MG tablet Take 1 tablet (600 mg total) by mouth 3 (three) times daily. (Patient taking differently: Take 600 mg by mouth 2 (two) times daily. May take a third 600 mg dose as needed for pain)   IPRATROPIUM BROMIDE NA    levothyroxine  (SYNTHROID ) 50 MCG tablet Take 1 tablet (50 mcg total) by mouth daily before breakfast.   melatonin 3 MG TABS tablet Take 3 mg by mouth at bedtime.   metoprolol  tartrate (LOPRESSOR ) 25 MG tablet Take 1 tablet (25 mg total) by mouth 2 (two) times daily.   metroNIDAZOLE (METROCREAM) 0.75 % cream Apply 1 Application topically as needed (rash).   SYSTANE ULTRA 0.4-0.3 % SOLN Place 1 drop into the left eye every 4 (four) hours as needed (dry/irritated eyes. (4-6 times daily)).      Review of Systems:   Please see the history of  present illness.    All other systems reviewed and are negative.     EKGs/Labs/Other Test Reviewed:   EKG:  Feb 2025 SR with IVCD  EKG Interpretation Date/Time:    Ventricular Rate:    PR Interval:    QRS Duration:    QT Interval:    QTC Calculation:   R Axis:      Text Interpretation:           Risk Assessment/Calculations:    CHA2DS2-VASc Score = 5   This indicates a 7.2% annual risk of stroke. The patient's score is based upon: CHF History: 0 HTN History: 0 Diabetes History: 0 Stroke History: 2 Vascular Disease History: 0 Age Score: 2 Gender Score: 1         Physical Exam:   VS:  BP 104/66   Pulse 76   Ht 5\' 7"  (1.702 m)   Wt 126 lb (57.2 kg)   SpO2 98%   BMI 19.73 kg/m  Wt Readings from Last 3 Encounters:  02/24/24 126 lb (57.2 kg)  01/28/24 130 lb (59 kg)  12/18/23 129 lb 9.6 oz (58.8 kg)      GENERAL:  No apparent distress, AOx3 HEENT:  No carotid bruits, +2 carotid impulses, no scleral icterus CAR: RRR no murmurs, gallops, rubs, or thrills RES:  Clear to auscultation bilaterally ABD:  Soft, nontender, nondistended, positive bowel sounds x 4 VASC:  +2 radial pulses, +2 carotid pulses NEURO:  CN 2-12 grossly intact; motor and sensory grossly intact PSYCH:  No active depression or anxiety EXT:  No edema, ecchymosis, or cyanosis  Signed, Bynum Mccullars K Johnhenry Tippin, MD  02/24/2024 11:09 AM    [facility] [address] [place], [place], [place]  [address] Phone: [phone]; Fax: [phone]   Note:  This document was prepared using Dragon voice recognition software and may include unintentional dictation errors.

## 2024-02-24 ENCOUNTER — Encounter: Payer: Self-pay | Admitting: Orthopaedic Surgery

## 2024-02-24 ENCOUNTER — Ambulatory Visit (INDEPENDENT_AMBULATORY_CARE_PROVIDER_SITE_OTHER): Admitting: Orthopaedic Surgery

## 2024-02-24 ENCOUNTER — Ambulatory Visit: Payer: PPO | Attending: Internal Medicine | Admitting: Internal Medicine

## 2024-02-24 ENCOUNTER — Encounter: Payer: Self-pay | Admitting: Internal Medicine

## 2024-02-24 ENCOUNTER — Other Ambulatory Visit (INDEPENDENT_AMBULATORY_CARE_PROVIDER_SITE_OTHER): Payer: Self-pay

## 2024-02-24 VITALS — BP 104/66 | HR 76 | Ht 67.0 in | Wt 126.0 lb

## 2024-02-24 DIAGNOSIS — I2510 Atherosclerotic heart disease of native coronary artery without angina pectoris: Secondary | ICD-10-CM | POA: Diagnosis not present

## 2024-02-24 DIAGNOSIS — E785 Hyperlipidemia, unspecified: Secondary | ICD-10-CM

## 2024-02-24 DIAGNOSIS — G8929 Other chronic pain: Secondary | ICD-10-CM

## 2024-02-24 DIAGNOSIS — Z95818 Presence of other cardiac implants and grafts: Secondary | ICD-10-CM

## 2024-02-24 DIAGNOSIS — M25562 Pain in left knee: Secondary | ICD-10-CM

## 2024-02-24 DIAGNOSIS — I480 Paroxysmal atrial fibrillation: Secondary | ICD-10-CM | POA: Diagnosis not present

## 2024-02-24 DIAGNOSIS — D6869 Other thrombophilia: Secondary | ICD-10-CM

## 2024-02-24 DIAGNOSIS — Z9889 Other specified postprocedural states: Secondary | ICD-10-CM

## 2024-02-24 DIAGNOSIS — N182 Chronic kidney disease, stage 2 (mild): Secondary | ICD-10-CM | POA: Diagnosis not present

## 2024-02-24 NOTE — Patient Instructions (Signed)
 Medication Instructions:  No changes *If you need a refill on your cardiac medications before your next appointment, please call your pharmacy*  Lab Work: none If you have labs (blood work) drawn today and your tests are completely normal, you will receive your results only by: MyChart Message (if you have MyChart) OR A paper copy in the mail If you have any lab test that is abnormal or we need to change your treatment, we will call you to review the results.  Testing/Procedures: none  Follow-Up: At [facility], you and your health needs are our priority.  As part of our continuing mission to provide you with exceptional heart care, our providers are all part of one team.  This team includes your primary Cardiologist (physician) and Advanced Practice Providers or APPs (Physician Assistants and Nurse Practitioners) who all work together to provide you with the care you need, when you need it.  Your next appointment:   6 month(s)  Provider:   One of our Advanced Practice Providers (APPs): Melita Springer, PA-C  Friddie Jetty, NP Evaline Hill, NP  Theotis Flake, PA-C Lawana Pray, NP  Willis Harter, PA-C Lovette Rud, PA-C  [place], [place] Charles Connor, NP  Marlana Silvan, NP Marcie Sever, PA-C  Laquita Plant, PA-C    Dayna Dunn, PA-C  Marlyse Single, PA-C Palmer Bobo, NP Katlyn West, NP Callie Goodrich, PA-C  Evan Williams, PA-C Sheng Haley, PA-C  Xika Zhao, NP Kathleen Johnson, PA-C

## 2024-02-24 NOTE — Progress Notes (Signed)
 The patient is a 78 year old female who comes in with a 3 to 4-week history of left knee pain but no known injury.  She says there is occasional soreness and locking or clicking sensation.  She has never had surgery on that knee and never had injections.  She said she really cannot do her regular walking like she likes to do but has not been a lot of swelling or return to things but has been somewhat uncomfortable.  She is traveling to [place] in 3 weeks to more make sure the knee is doing okay.  She cannot take traditional anti-inflammatories but can take Tylenol .  I was able to review her past medical history and medications within epic.  Examination of both knees today shows no effusion.  Both knees have full range of motion and are ligamentously stable.  Both knees have neutral alignment.  Standing AP and lateral of the left knee shows normal-appearing joint space at the medial lateral compartments and minimal narrowing at the patellofemoral joint.  The alignment is neutral and there is no effusion.  I did recommend a steroid injection for her left knee today and she agreed to this and tolerated it well.  I talked to her about quad strengthening exercises and had her demonstrate these to me.  I think this will be helpful as well as considering a knee sleeve such as a copper fit knee sleeve for walking when she is overseas.  When she gets back from her trip if she continues to have any issues with her left knee she will let us  know.  We may need MRI of the knee if she continues to have any mechanical symptoms.

## 2024-03-14 NOTE — Progress Notes (Unsigned)
 Cardiology Office Note:  .   Date:  03/14/2024  ID:  Kellie Humphrey, DOB Nov 29, 1945, MRN [id number] PCP: Jeannine Milroy., MD  [facility] Providers Cardiologist:  Kyra Phy, MD Electrophysiologist:  Manya Sells, MD {  History of Present Illness: .   Kellie Humphrey is a 78 y.o. female w/PMHx of  hypothyroidism VHD (s/p MV repair 2021) SVT, PACs, PVCs Retinal artery occl (presumptive cardioembolic) AFib  She saw Dr. Carolynne Citron 12/15/23, recent transient loss of vision found with retinal artery occl, certain this was cardio embolic > started on OAC though felt to be a poor longterm a/c candidate 2/2 hx of fall resulting on intracranial hemorrhage,  and medication interactions and referred to Dr. Daneil Dunker to consider watchman implant  She saw Dr. Daneil Dunker 12/18/23, felt a candidate and planned to pursue implant  LAAO on 01/28/24, discharged same day Post Implant Anticoagulation Strategy: Continue Eliquis  5mg  twice daily for 45 days, then transition to dual anti-platelet therapy with aspirin  81mg  and clopidogrel  75mg  once daily for 6 months.    Today's visit is scheduled as her post watchaman visit ROS:   She is doing well, looks forward to getting off her Eliquis  No procedural concerns, site healed well No CP Reports daily episodes of AFib ~ , less then an hour, this is an improvement No near syncope or syncope   Arrhythmia/AAD hx Flecainide  started 2019 LAAO on 01/28/24  Studies Reviewed: Aaron Aas    EKG done today and reviewed by myself:  SB 58bpm, PR , QRS , QTc   01/28/24: Watchman Transeptal Puncture Intra-procedural TEE which showed no LAA thrombus Left atrial appendage occlusive device placement on 01/28/24 by Dr. Daneil Dunker.  CONCLUSIONS:  1.Successful implantation of a WATCHMAN left atrial appendage occlusive device    2. TEE demonstrating no LAA thrombus 3. No early apparent complications.   Echo 11/22/23:  1. Left ventricular ejection  fraction, by estimation, is 55 to 60%. The  left ventricle has normal function. The left ventricle has no regional  wall motion abnormalities. Left ventricular diastolic function could not  be evaluated.   2. Right ventricular systolic function is normal. The right ventricular  size is normal. There is normal pulmonary artery systolic pressure. The  estimated right ventricular systolic pressure is 31.5 mmHg.   3. The mitral valve has been repaired/replaced. No evidence of mitral  valve regurgitation. The mean mitral valve gradient is 6.0 mmHg with  average heart rate of 82 bpm. There is a 32 mm [name] prosthetic  annuloplasty ring present in the mitral position. Procedure Date: 02/10/20.   4. The aortic valve is tricuspid. There is mild calcification of the  aortic valve. Aortic valve regurgitation is not visualized. No aortic  stenosis is present.    Risk Assessment/Calculations:    Physical Exam:   VS:  There were no vitals taken for this visit.   Wt Readings from Last 3 Encounters:  02/24/24 126 lb (57.2 kg)  01/28/24 130 lb (59 kg)  12/18/23 129 lb 9.6 oz (58.8 kg)    GEN: Well nourished, well developed in no acute distress NECK: No JVD; No carotid bruits CARDIAC: RRR, no murmurs, rubs, gallops RESPIRATORY:  CTA b/l without rales, wheezing or rhonchi  ABDOMEN: Soft, non-tender, non-distended EXTREMITIES: No edema; No deformity   ASSESSMENT AND PLAN: .    paroxysmal AFib Atrial arrhythmias CHA2DS2Vasc is 5 Chronic flecainide /metoprolol , w/stable intervals S/p Watchman   Stop Eliquis  Start ASA 81mg  daily, plavix   75mg  daily Antibiotic prophylaxis (lifelong given her valve), she is very familiar given her MV repair (needs refills though) CT scheduled, BMET today Back in ~ 65mo (18mo post implant)  VHD S/p MV repair Stable by her echo 2025 C/w Dr. Nedra Ball  Secondary hypercoagulable state 2/2 AFib    Dispo: as above, sooner if needed  Signed, Debbie Fails, PA-C

## 2024-03-16 ENCOUNTER — Ambulatory Visit: Attending: Cardiology | Admitting: Physician Assistant

## 2024-03-16 VITALS — BP 112/68 | HR 58 | Ht 67.0 in | Wt 128.0 lb

## 2024-03-16 DIAGNOSIS — I480 Paroxysmal atrial fibrillation: Secondary | ICD-10-CM | POA: Diagnosis not present

## 2024-03-16 DIAGNOSIS — Z5181 Encounter for therapeutic drug level monitoring: Secondary | ICD-10-CM

## 2024-03-16 DIAGNOSIS — Z9889 Other specified postprocedural states: Secondary | ICD-10-CM | POA: Diagnosis not present

## 2024-03-16 DIAGNOSIS — Z79899 Other long term (current) drug therapy: Secondary | ICD-10-CM

## 2024-03-16 DIAGNOSIS — Z95818 Presence of other cardiac implants and grafts: Secondary | ICD-10-CM

## 2024-03-16 DIAGNOSIS — D6869 Other thrombophilia: Secondary | ICD-10-CM | POA: Diagnosis not present

## 2024-03-16 MED ORDER — AMOXICILLIN 500 MG PO TABS: 2000.0000 mg | ORAL_TABLET | Freq: Once | ORAL | 0 refills | Status: AC

## 2024-03-16 MED ORDER — AMOXICILLIN 500 MG PO TABS: 1000.0000 mg | ORAL_TABLET | Freq: Once | ORAL | 0 refills | Status: DC

## 2024-03-16 MED ORDER — CLOPIDOGREL BISULFATE 75 MG PO TABS: 75.0000 mg | ORAL_TABLET | Freq: Every day | ORAL | 3 refills | Status: DC

## 2024-03-16 NOTE — Patient Instructions (Addendum)
 Medication Instructions:   START TAKING: AMOXICILLIN  500 MG ( TAKE 2000 MG) 1 HOUR PRIOR TO DENTAL PROCEDURES  START TAKING ASPIRIN  81 MG ONCE A DAY    START TAKING: PLAVIX  75 MG ONCE A DAY    STOP TAKING AND REMOVE THIS MEDICATION FROM YOUR MEDICATION LIST:   ELIQUIS     *If you need a refill on your cardiac medications before your next appointment, please call your pharmacy*   Lab Work:  PLEASE GO DOWN STAIRS  [facility]  FIRST FLOOR   ( GET OFF ELEVATORS WALK TOWARDS WAITING AREA LAB LOCATED BY PHARMACY):  BMET TODAY     If you have labs (blood work) drawn today and your tests are completely normal, you will receive your results only by: MyChart Message (if you have MyChart) OR A paper copy in the mail If you have any lab test that is abnormal or we need to change your treatment, we will call you to review the results.    Testing/Procedures:  PLEASE GO DOWN STAIRS  [facility]  FIRST FLOOR   ( GET OFF ELEVATORS WALK TOWARDS WAITING AREA LAB LOCATED BY PHARMACY):     Follow-Up:   At [facility], you and your health needs are our priority.  As part of our continuing mission to provide you with exceptional heart care, our providers are all part of one team.  This team includes your primary Cardiologist (physician) and Advanced Practice Providers or APPs (Physician Assistants and Nurse Practitioners) who all work together to provide you with the care you need, when you need it.   Your next appointment:    5 month(s) ( CONTACT  CASSIE HALL/ ANGELINE HAMMER FOR EP SCHEDULING ISSUES )    Provider:    You may see  Dr Daneil Dunker  or one of the following Advanced Practice Providers on your designated Care Team:   Mertha Abrahams, [place]    We recommend signing up for the patient portal called MyChart.  Sign up information is provided on this After Visit Summary.  MyChart is used to connect with patients for Virtual Visits (Telemedicine).  Patients are able to view lab/test results,  encounter notes, upcoming appointments, etc.  Non-urgent messages can be sent to your provider as well.   To learn more about what you can do with MyChart, go to [email or web address].   Other Instructions

## 2024-03-17 ENCOUNTER — Ambulatory Visit: Payer: Self-pay | Admitting: Physician Assistant

## 2024-03-17 LAB — BASIC METABOLIC PANEL WITH GFR
BUN/Creatinine Ratio: 11 — ABNORMAL LOW (ref 12–28)
BUN: 8 mg/dL (ref 8–27)
CO2: 24 mmol/L (ref 20–29)
Calcium: 9.1 mg/dL (ref 8.7–10.3)
Chloride: 103 mmol/L (ref 96–106)
Creatinine, Ser: 0.75 mg/dL (ref 0.57–1.00)
Glucose: 71 mg/dL (ref 70–99)
Potassium: 4.1 mmol/L (ref 3.5–5.2)
Sodium: 142 mmol/L (ref 134–144)
eGFR: 82 mL/min/{1.73_m2} (ref >59)

## 2024-03-24 ENCOUNTER — Telehealth: Payer: Self-pay | Admitting: Cardiology

## 2024-03-24 NOTE — Telephone Encounter (Signed)
 Pt c/o medication issue:  1. Name of Medication:  clopidogrel  (PLAVIX ) 75 MG tablet   2. How are you currently taking this medication (dosage and times per day)?   3. Are you having a reaction (difficulty breathing--STAT)?   4. What is your medication issue?  Plavix  + aspirin  started on 6/19. She has had a lot of bruising--mainly on her arms. She says she noticed it over the past few days.

## 2024-03-24 NOTE — Telephone Encounter (Signed)
 Spoke with pt, since switching from eliquis  to asa and plavix , she has noticed she has several large bruises on her arms. 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 Aware will forward to dr kennyth to review.

## 2024-03-31 NOTE — Telephone Encounter (Signed)
 LAAO on 01/28/24, discharged same day Post Implant Anticoagulation Strategy: Continue Eliquis  5mg  twice daily for 45 days, then transition to dual anti-platelet therapy with aspirin  81mg  and clopidogrel  75mg  once daily for 6 months.    ____________________________________________________  Will route to Dr. Kennyth for any input on bruising on current antiplatelet therapy.

## 2024-03-31 NOTE — Telephone Encounter (Signed)
 Recommend aspirin  and Plavix  for total of 6 months then aspirin  alone. She could go back to Eliquis  but that would defeat the purpose of the Watchman.  ________________________________________________________   Kellie Humphrey and let patient know.  She had several questions about asa vs asa and plavix  vs eliquis .  I did answer/address all of her concerns.  Reviewed Discussed any larger bruising that may or may not be raised as well as any that is in an unusual location such as her back or torso or legs needs reported asap.  Suggested if she has concerns about a particular bruise or wants MD to see all the bruising on her arms to upload a picture into the chart online.   No reports of bruising other than on her arms and the larger one on her arm has started healing since she called last week.     At end of call patient asked if Charlies would have any other recommendations.

## 2024-03-31 NOTE — Telephone Encounter (Signed)
 Sent message to patient portal from [place].   No, nothing to add, I think with all the hustling/bustling/traveling she is probably bumping her arms without realizing it, DAPT can cause easy bruising as well, but once down to ASA alone would expect hat to get better.     Renee

## 2024-03-31 NOTE — Telephone Encounter (Signed)
 Pt calling back due to no cb from us , requesting cb to discuss bruising

## 2024-04-04 DIAGNOSIS — L821 Other seborrheic keratosis: Secondary | ICD-10-CM | POA: Diagnosis not present

## 2024-04-04 DIAGNOSIS — D692 Other nonthrombocytopenic purpura: Secondary | ICD-10-CM | POA: Diagnosis not present

## 2024-04-04 DIAGNOSIS — L738 Other specified follicular disorders: Secondary | ICD-10-CM | POA: Diagnosis not present

## 2024-04-04 DIAGNOSIS — I788 Other diseases of capillaries: Secondary | ICD-10-CM | POA: Diagnosis not present

## 2024-04-04 DIAGNOSIS — D225 Melanocytic nevi of trunk: Secondary | ICD-10-CM | POA: Diagnosis not present

## 2024-04-04 DIAGNOSIS — D2261 Melanocytic nevi of right upper limb, including shoulder: Secondary | ICD-10-CM | POA: Diagnosis not present

## 2024-04-04 DIAGNOSIS — D2272 Melanocytic nevi of left lower limb, including hip: Secondary | ICD-10-CM | POA: Diagnosis not present

## 2024-04-04 DIAGNOSIS — Z85820 Personal history of malignant melanoma of skin: Secondary | ICD-10-CM | POA: Diagnosis not present

## 2024-04-04 DIAGNOSIS — Z85828 Personal history of other malignant neoplasm of skin: Secondary | ICD-10-CM | POA: Diagnosis not present

## 2024-04-05 ENCOUNTER — Encounter: Payer: Self-pay | Admitting: Internal Medicine

## 2024-04-05 MED ORDER — ATORVASTATIN CALCIUM 40 MG PO TABS: 40.0000 mg | ORAL_TABLET | Freq: Every day | ORAL | 3 refills | Status: AC

## 2024-04-05 NOTE — Telephone Encounter (Signed)
 Spoke with the patient and gave recommendations per Dr. Kennyth. Patient will stay on DAPT for now and let us  know if she has any further concerns with bleeding.

## 2024-04-06 ENCOUNTER — Ambulatory Visit (HOSPITAL_COMMUNITY)
Admission: RE | Admit: 2024-04-06 | Discharge: 2024-04-06 | Disposition: A | Discharge status: Home / Self Care | Source: Ambulatory Visit | Attending: Internal Medicine | Admitting: Internal Medicine

## 2024-04-06 DIAGNOSIS — I480 Paroxysmal atrial fibrillation: Secondary | ICD-10-CM | POA: Diagnosis not present

## 2024-04-06 DIAGNOSIS — R911 Solitary pulmonary nodule: Secondary | ICD-10-CM | POA: Diagnosis not present

## 2024-04-06 DIAGNOSIS — Z95818 Presence of other cardiac implants and grafts: Secondary | ICD-10-CM | POA: Insufficient documentation

## 2024-04-06 MED ORDER — IOHEXOL 350 MG/ML SOLN
100.0000 mL | Freq: Once | INTRAVENOUS | Status: AC | PRN
  Administered 2024-04-06: 100 mL via INTRAVENOUS

## 2024-04-17 ENCOUNTER — Ambulatory Visit: Payer: Self-pay | Admitting: Cardiology

## 2024-04-19 NOTE — Telephone Encounter (Signed)
 Called patient to make sure she does not have any questions about CT results and to schedule 6 month post LAAO visit (~07/30/2024).  Left message to call back.

## 2024-04-30 ENCOUNTER — Emergency Department (HOSPITAL_BASED_OUTPATIENT_CLINIC_OR_DEPARTMENT_OTHER)

## 2024-04-30 ENCOUNTER — Emergency Department (HOSPITAL_BASED_OUTPATIENT_CLINIC_OR_DEPARTMENT_OTHER)
Admission: EM | Admit: 2024-04-30 | Discharge: 2024-05-01 | Disposition: A | Discharge status: Home / Self Care | Attending: Emergency Medicine | Admitting: Emergency Medicine

## 2024-04-30 ENCOUNTER — Other Ambulatory Visit: Payer: Self-pay

## 2024-04-30 DIAGNOSIS — S8002XA Contusion of left knee, initial encounter: Secondary | ICD-10-CM | POA: Diagnosis not present

## 2024-04-30 DIAGNOSIS — M7989 Other specified soft tissue disorders: Secondary | ICD-10-CM | POA: Diagnosis not present

## 2024-04-30 DIAGNOSIS — R519 Headache, unspecified: Secondary | ICD-10-CM | POA: Diagnosis not present

## 2024-04-30 DIAGNOSIS — S42295A Other nondisplaced fracture of upper end of left humerus, initial encounter for closed fracture: Secondary | ICD-10-CM | POA: Diagnosis not present

## 2024-04-30 DIAGNOSIS — Y92512 Supermarket, store or market as the place of occurrence of the external cause: Secondary | ICD-10-CM | POA: Insufficient documentation

## 2024-04-30 DIAGNOSIS — M85862 Other specified disorders of bone density and structure, left lower leg: Secondary | ICD-10-CM | POA: Diagnosis not present

## 2024-04-30 DIAGNOSIS — M1712 Unilateral primary osteoarthritis, left knee: Secondary | ICD-10-CM | POA: Diagnosis not present

## 2024-04-30 DIAGNOSIS — S50311A Abrasion of right elbow, initial encounter: Secondary | ICD-10-CM | POA: Diagnosis not present

## 2024-04-30 DIAGNOSIS — M25562 Pain in left knee: Secondary | ICD-10-CM | POA: Diagnosis present

## 2024-04-30 DIAGNOSIS — Z7982 Long term (current) use of aspirin: Secondary | ICD-10-CM | POA: Insufficient documentation

## 2024-04-30 DIAGNOSIS — S42202A Unspecified fracture of upper end of left humerus, initial encounter for closed fracture: Secondary | ICD-10-CM | POA: Diagnosis not present

## 2024-04-30 DIAGNOSIS — S42201A Unspecified fracture of upper end of right humerus, initial encounter for closed fracture: Secondary | ICD-10-CM | POA: Diagnosis not present

## 2024-04-30 DIAGNOSIS — S42291A Other displaced fracture of upper end of right humerus, initial encounter for closed fracture: Secondary | ICD-10-CM | POA: Diagnosis not present

## 2024-04-30 DIAGNOSIS — S199XXA Unspecified injury of neck, initial encounter: Secondary | ICD-10-CM | POA: Diagnosis not present

## 2024-04-30 DIAGNOSIS — S46011A Strain of muscle(s) and tendon(s) of the rotator cuff of right shoulder, initial encounter: Secondary | ICD-10-CM | POA: Diagnosis not present

## 2024-04-30 DIAGNOSIS — W010XXA Fall on same level from slipping, tripping and stumbling without subsequent striking against object, initial encounter: Secondary | ICD-10-CM | POA: Diagnosis not present

## 2024-04-30 DIAGNOSIS — S82002A Unspecified fracture of left patella, initial encounter for closed fracture: Secondary | ICD-10-CM | POA: Insufficient documentation

## 2024-04-30 DIAGNOSIS — I4891 Unspecified atrial fibrillation: Secondary | ICD-10-CM | POA: Insufficient documentation

## 2024-04-30 DIAGNOSIS — S0990XA Unspecified injury of head, initial encounter: Secondary | ICD-10-CM | POA: Diagnosis not present

## 2024-04-30 DIAGNOSIS — S82045A Nondisplaced comminuted fracture of left patella, initial encounter for closed fracture: Secondary | ICD-10-CM | POA: Diagnosis not present

## 2024-04-30 DIAGNOSIS — Z981 Arthrodesis status: Secondary | ICD-10-CM | POA: Diagnosis not present

## 2024-04-30 DIAGNOSIS — S42351A Displaced comminuted fracture of shaft of humerus, right arm, initial encounter for closed fracture: Secondary | ICD-10-CM | POA: Diagnosis not present

## 2024-04-30 DIAGNOSIS — R600 Localized edema: Secondary | ICD-10-CM | POA: Diagnosis not present

## 2024-04-30 DIAGNOSIS — M19011 Primary osteoarthritis, right shoulder: Secondary | ICD-10-CM | POA: Diagnosis not present

## 2024-04-30 DIAGNOSIS — G319 Degenerative disease of nervous system, unspecified: Secondary | ICD-10-CM | POA: Diagnosis not present

## 2024-04-30 NOTE — Progress Notes (Signed)
 Orthopedic Surgery Office Note  Chief Complaint  Patient presents with  . Right Elbow - Injury    Pt fell about 12:00 at [facility] when she felt her foot get stuck on the floor and tripped her up.   . Left Knee - Injury  . Right Shoulder - Injury      History of Present Illness: [name] is a very pleasant 78 year old female who comes in today accompanied by her husband with a chief complaint of left knee and right shoulder pain after an injury she sustained 1 hour ago.  Patient reports that she was walking in a grocery store and slipped falling directly onto her right knee and an outstretched right hand.  She also had a scrape to the right elbow which did break the skin.  She points to the left anterior knee as the area of greatest amount of pain.  She noticed swelling almost immediately after the injury which has gotten progressively worse.  She has difficulty with extension of the left knee but no problem with flexion of the knee.  Pain is 5 out of 10 on 10 point scale but increases with weightbearing.  Patient points to the lateral portion of the right shoulder as the area of greatest amount of pain in this region.  Pain occurs mostly with overhead activities but also with pushing and pulling motions.  There is no numbness or tingling down to the right hand.  Patient is on blood thinner for A-fib.  She denies nausea vomiting fever chills or paresthesias bilaterally.   History and Physical standard intake form from today's or previous visit is reviewed, pertinent information is noted and otherwise scanned into the patient's permanent medical record for future use.    Review of Systems:  Negative unless otherwise specified in HPI   Physical Exam:   General:  Alert and oriented x 3, NAD, well kempt, and of stated age.   Vital Signs:  BP 116/65  , There is no height or weight on file to calculate BMI.   HEENT:  Head is normocephalic and atraumatic. Extraocular muscles are grossly intact.  Pupils are equal, round, and reactive to light and accommodation. Nares appeared normal. Mucous membranes are moist.  Neck:  Supple, no visible swelling.  Cardiovascular:  Hemodynamically stable.  Respiratory:  No audible wheezing, unlabored respirations.  Neurological:  Cranial nerves II through XII appear grossly intact.   Skin:  Clean and dry, well kempt.  Psychiatric:  Good affect, stable, cooperative  Extremities:  Pink without cyanosis, bilaterally.  On physical exam the left knee is well aligned with no varus or valgus deformity.  Tenderness to palpation noted to the anterior and anterior medial aspect of the left knee.  Positive extracapsular edema noted to the prepatellar area of the left knee.  Range of motion is reduced compared to the right knee with extension lag of roughly 20 degrees.  Flexion noted to roughly 80 degrees with discomfort.  Strength is limited due to pain flexion extension of the left knee.  Unable to perform McMurray's test due to pain.  Lachman's reveals no laxity with testing.  Dorsalis pedis pulses are 2+ bilaterally, distal toes are well perfused capillary refill less than 2 seconds.  Normal sensation is noted.  Skin is healthy and intact.  On physical exam the right shoulder is well aligned without gross deformity.  There is positive tenderness to palpation to the subacromial area and lateral aspect of the of the right shoulder.  Patient has significant limitation with range of motion of the right shoulder as she has difficulty reaching more than 30 degrees of abduction or flexion.  Specialty test such as O'Brien's and Hawkins impingement are difficult to perform due to range of motion and strength limitations.   Yergason maneuver is negative for bicep weakness.  The right elbow is well aligned without gross deformity.  Range of motion of the right elbow is equal to the left with 120+ degrees of flexion and 0 degrees of extension.  Collateral ligaments appear  intact with varus and valgus stress testing.  Small abrasion noted to the right posterior elbow.  No significant edema or erythema is noted and skin is healthy and intact.  Radial pulses are 2+ bilaterally, distal fingers are well-perfused with capillary refill less than 2 seconds.  Normal sensation is noted.       Assessment:    Contusion left knee possible patellar fracture, rotator cuff strain right shoulder, abrasion right elbow   Plan:   I had a detailed discussion with patient and husband regarding continued treatment of the injuries.  Today recommended patient continue with conservative treatment including rest ice elevation and NSAIDs.  Based on subjective history as well as physical exam and x-ray results further recommendation is made for application of knee immobilizer for the left knee and CT scan of left knee for further evaluation of possible patellar fracture.  Patient and husband like to hold on this option and follow-up with their orthopedic surgeon before advanced imaging is ordered.  Patient will be placed in cradle sling and dressing placed on abrasion.  3 view x-rays were taken of the right shoulder well aligned joint space without evidence of fracture.  Mild degenerative changes but no concerning pathology noted.  X-rays of the right elbow reveal normal bony anatomy with well-maintained joint spaces and no fractures or dislocations.  Three-view radiographs taken of the left knee today reveals well-maintained joint spaces however areas of lucency through the patella could indicate possible nondisplaced fracture.  No other dislocations or fractures noted.  She will continue this conservative management and follow-up as scheduled.   Test Results    Labs  No results found for: WBC, HGB, HCT, PLT  Lab Results  Component Value Date   CREATININE 0.66 04/25/2023    No results found for: BILITOT, BILIDIR, PROT, ALBUMIN , ALT, AST, ALP, GGT  No results found  for: LABPROT, INR, PTT  Allergies  Promethazine , Codeine, Oxycodone , and Povidone-iodine    Medications    Current Medications[1]   Past Medical History  Past medical history reviewed and otherwise negative unless stated below.  Medical History[2]   Past Surgical History  Past surgical history reviewed and otherwise negative unless stated below.  Surgical History[3]   Family History  Family history reviewed and otherwise negative unless stated below.  Family History[4]   Social History:  Social history reviewed and otherwise negative unless stated below.  Social History   Socioeconomic History  . Marital status: Married    Spouse name: Not on file  . Number of children: Not on file  . Years of education: Not on file  . Highest education level: Not on file  Occupational History  . Not on file  Tobacco Use  . Smoking status: Never  . Smokeless tobacco: Never  Vaping Use  . Vaping status: Never Used  Substance and Sexual Activity  . Alcohol  use: Not Currently  . Drug use: No  . Sexual activity: Not on file  Other Topics Concern  . Not on file  Social History Narrative  . Not on file   Social Drivers of Health   Food Insecurity: No Food Insecurity (01/28/2024)   Received from [facility]   Food vital sign   . Within the past 12 months, you worried that your food would run out before you got money to buy more: Never true   . Within the past 12 months, the food you bought just didn't last and you didn't have money to get more: Never true  Transportation Needs: No Transportation Needs (01/28/2024)   Received from [facility] - Transportation   . Lack of Transportation (Medical): No   . Lack of Transportation (Non-Medical): No  Safety: Not At Risk (01/28/2024)   Received from [facility]   Safety   . Within the last year, have you been afraid of your partner or ex-partner?: No   . Within the last year, have you been humiliated or  emotionally abused in other ways by your partner or ex-partner?: No   . Within the last year, have you been kicked, hit, slapped, or otherwise physically hurt by your partner or ex-partner?: No   . Within the last year, have you been raped or forced to have any kind of sexual activity by your partner or ex-partner?: No  Living Situation: Low Risk  (01/28/2024)   Received from [facility] Situation   . In the last 12 months, was there a time when you were not able to pay the mortgage or rent on time?: No   . In the past 12 months, how many times have you moved where you were living?: 0   . At any time in the past 12 months, were you homeless or living in a shelter (including now)?: No         Problem List  Problem List[5]         [1] Current Outpatient Medications  Medication Sig Dispense Refill  . acetaminophen  (TYLENOL ) 500 mg tablet Take 500-1,000 mg by mouth.    . aspirin  81 mg EC tablet Take 1 tablet (81 mg total) by mouth daily. Hold until 1 month follow up with Neurosurgery. (Patient not taking: Reported on 05/18/2023)    . betamethasone dipropionate (DIPROLENE) 0.05 % cream Apply topically.    . biotin 10 mg tab tablet Take  by mouth.    . cholecalciferol (VITAMIN D3) 125 mcg (5,000 unit) capsule Take 2,000 Units by mouth daily.    . cyanocobalamin, vitamin B-12, 5,000 mcg cap Take 5,000 mcg by mouth.    . diclofenac  sodium (VOLTAREN ) 1 % gel APPLY 2-4 GRAMS TO LARGE JOINT AREA UP TO FOUR TIMES A DAY AS NEEDED    . flecainide  (TAMBOCOR ) 50 mg tablet Take 75 mg by mouth 2 (two) times a day. With food    . gabapentin  (NEURONTIN ) 600 mg tablet Take 600 mg by mouth 3 (three) times a day. 270 tablet 3  . levothyroxine  (SYNTHROID ) 50 mcg tablet Take 50 mcg by mouth daily.  6  . melatonin 5 mg tablet Take 5 mg by mouth at bedtime.    . metoprolol  tartrate (LOPRESSOR ) 25 mg tablet Take 1 tablet (25 mg total) by mouth 2 (two) times a day. Hold until follow up with primary  care provider (Patient not taking: Reported on 05/18/2023)    . primidone  (MYSOLINE ) 25 mg tab split tablet Take 50 mg by mouth at bedtime.  No current facility-administered medications for this visit.  [2] Past Medical History: Diagnosis Date  . Abnormal heart rhythm   . Arthritis   . Breast cancer    (CMD)   [3] Past Surgical History: Procedure Laterality Date  . BREAST LUMPECTOMY     Procedure: BREAST LUMPECTOMY  . SKIN CANCER EXCISION     Procedure: SKIN CANCER EXCISION  . TONSILLECTOMY     Procedure: TONSILLECTOMY  [4] Family History Problem Relation Name Age of Onset  . Hearing loss Father    . Heart disease Mother    [5] Patient Active Problem List Diagnosis  . Chronic interstitial cystitis  . Other chronic cystitis  . History of recurrent UTIs  . Tinnitus of both ears  . Globus sensation  . Laryngopharyngeal reflux (LPR)  . Trauma

## 2024-04-30 NOTE — ED Notes (Signed)
 Patient transported to CT

## 2024-04-30 NOTE — ED Provider Notes (Signed)
 [place] EMERGENCY DEPARTMENT AT [facility] Provider Note   CSN: [id number] Arrival date & time: 04/30/24  2122     Patient presents with: [place]   Kellie Humphrey is a 78 y.o. female.  {Add pertinent medical, surgical, social history, OB history to YEP:[id number]} Patient with a history of atrial fibrillation status post Watchman procedure on aspirin  and Plavix , mitral valve repair, breast cancer, restless leg syndrome here with right arm and knee pain after falling.  She was in the grocery store about noon today when she stumbled and fell onto her left knee and her right shoulder.  Denies hitting her head or losing consciousness.  Sustained injury to her shoulder and knee and went to urgent care where she had x-rays that were inconclusive of possible patellar fracture but negative right shoulder x-ray.  Denies hitting head or losing conscious.  Last took Eliquis  about 2 or 3 weeks ago.  Comes in tonight because she noticed a new bruise to her right upper arm that was not there previously.  Does not know where she hit it on.  Pain with range of motion of right shoulder and left knee.  Denies any head, neck, back, chest or abdominal pain.  No dizziness or lightheadedness.  No chest pain or shortness of breath.  The history is provided by the patient and the spouse.  Fall Pertinent negatives include no chest pain, no abdominal pain, no headaches and no shortness of breath.       Prior to Admission medications   Medication Sig Start Date End Date Taking? Authorizing Provider  acetaminophen  (TYLENOL ) 500 MG tablet Take 500-1,000 mg by mouth as needed for mild pain (pain score 1-3).    [provider]  aspirin  81 MG chewable tablet Chew 81 mg by mouth daily.    [provider]  atorvastatin  (LIPITOR) 40 MG tablet Take 1 tablet (40 mg total) by mouth daily. 04/05/24   Waddell Danelle ORN, MD  Biotin 5000 MCG CAPS Take 10,000 mcg by mouth daily.    [provider]   BONIVA 150 MG tablet Take 150 mg by mouth every 30 (thirty) days. 12/10/23   [provider]  calcium  elemental as carbonate (TUMS ULTRA 1000) 400 MG chewable tablet Chew 1,000 mg by mouth daily as needed for heartburn.    [provider]  Cholecalciferol (VITAMIN D ) 2000 units tablet Take 2,000 Units by mouth daily.    [provider]  clopidogrel  (PLAVIX ) 75 MG tablet Take 1 tablet (75 mg total) by mouth daily. 03/16/24   Leverne Charlies Helling, PA-C  famotidine  (PEPCID ) 20 MG tablet Take 20 mg by mouth every evening.    [provider]  flecainide  (TAMBOCOR ) 50 MG tablet Take two tablets (100 mg) by mouth every morning and one and half tablets (75 mg) every evening 10/15/23   Waddell Danelle ORN, MD  gabapentin  (NEURONTIN ) 600 MG tablet Take 1 tablet (600 mg total) by mouth 3 (three) times daily. Patient taking differently: Take 600 mg by mouth 2 (two) times daily. May take a third 600 mg dose as needed for pain 12/10/23   Ines Onetha NOVAK, MD  IPRATROPIUM BROMIDE NA  02/07/24   [provider]  levothyroxine  (SYNTHROID ) 50 MCG tablet Take 1 tablet (50 mcg total) by mouth daily before breakfast. 11/13/15   Magrinat, Sandria BROCKS, MD  melatonin 3 MG TABS tablet Take 3 mg by mouth at bedtime.    [provider]  metoprolol  tartrate (LOPRESSOR )  25 MG tablet Take 1 tablet (25 mg total) by mouth 2 (two) times daily. 05/20/23   Lucien Orren SAILOR, PA-C  metroNIDAZOLE (METROCREAM) 0.75 % cream Apply 1 Application topically as needed (rash).    [provider]  SYSTANE ULTRA 0.4-0.3 % SOLN Place 1 drop into the left eye every 4 (four) hours as needed (dry/irritated eyes. (4-6 times daily)).  12/19/19   [provider]    Allergies: Codeine, Phenergan  [promethazine ], Betadine [povidone iodine ], and Oxycodone     Review of Systems  Constitutional:  Negative for activity change and appetite change.  HENT:  Negative for congestion and rhinorrhea.    Respiratory:  Negative for cough, chest tightness and shortness of breath.   Cardiovascular:  Negative for chest pain.  Gastrointestinal:  Negative for abdominal pain, nausea and vomiting.  Genitourinary:  Negative for dysuria and hematuria.  Musculoskeletal:  Positive for arthralgias and myalgias.  Neurological:  Negative for dizziness, weakness and headaches.   all other systems are negative except as noted in the HPI and PMH.    Updated Vital Signs BP 132/78 (BP Location: Left Arm)   Pulse 73   Temp 98.4 F (36.9 C) (Oral)   Resp 15   Ht 5' 7 (1.702 m)   Wt 59 kg   SpO2 100%   BMI 20.36 kg/m   Physical Exam Vitals and nursing note reviewed.  Constitutional:      General: She is not in acute distress.    Appearance: She is well-developed.  HENT:     Head: Normocephalic and atraumatic.     Mouth/Throat:     Pharynx: No oropharyngeal exudate.  Eyes:     Conjunctiva/sclera: Conjunctivae normal.     Pupils: Pupils are equal, round, and reactive to light.  Neck:     Comments: No midline C-spine tenderness Cardiovascular:     Rate and Rhythm: Normal rate and regular rhythm.     Heart sounds: Normal heart sounds. No murmur heard. Pulmonary:     Effort: Pulmonary effort is normal. No respiratory distress.     Breath sounds: Normal breath sounds.  Abdominal:     Palpations: Abdomen is soft.     Tenderness: There is no abdominal tenderness. There is no guarding or rebound.  Musculoskeletal:        General: Tenderness present. Normal range of motion.     Cervical back: Normal range of motion and neck supple.     Comments: Ecchymosis over right bicep without bony tenderness.  Full range of motion of right shoulder and right elbow.  Intact radial pulse and cardinal hand movements.  Ecchymosis and tenderness over left patella.  Flexion extension are intact.  Intact DP and PT pulses.  Pelvis is stable  Skin:    General: Skin is warm.  Neurological:     Mental Status: She is  alert and oriented to person, place, and time.     Cranial Nerves: No cranial nerve deficit.     Motor: No abnormal muscle tone.     Coordination: Coordination normal.     Comments:  5/5 strength throughout. CN 2-12 intact.Equal grip strength.   Psychiatric:        Behavior: Behavior normal.     (all labs ordered are listed, but only abnormal results are displayed) Labs Reviewed - No data to display  EKG: None  Radiology: No results found.  {Document cardiac monitor, telemetry assessment procedure when appropriate:32947} Procedures   Medications Ordered in the ED - No  data to display    {Click here for ABCD2, HEART and other calculators REFRESH Note before signing:1}                              Medical Decision Making Amount and/or Complexity of Data Reviewed Labs: ordered. Decision-making details documented in ED Course. Radiology: ordered and independent interpretation performed. Decision-making details documented in ED Course. ECG/medicine tests: ordered and independent interpretation performed. Decision-making details documented in ED Course.   Fall 12 hours ago here with increased bruising to right upper arm and left knee.  Denies head injury.  No head or neck pain.  Does take Plavix  but no other anticoagulation.  Had negative x-rays at urgent care earlier today.  {Document critical care time when appropriate  Document review of labs and clinical decision tools ie CHADS2VASC2, etc  Document your independent review of radiology images and any outside records  Document your discussion with family members, caretakers and with consultants  Document social determinants of health affecting pt's care  Document your decision making why or why not admission, treatments were needed:32947:::1}   Final diagnoses:  None    ED Discharge Orders     None

## 2024-04-30 NOTE — ED Triage Notes (Signed)
 Pt POV with Husband d/t Mechanical fall at [facility] at noon today - tripped in aisle - c/o right shoulder and left knee pain.  Pt was seen at [facility] but concerned d/t large hematoma on right shoulder and is on PLAVIX .

## 2024-05-01 ENCOUNTER — Other Ambulatory Visit: Payer: Self-pay | Admitting: Internal Medicine

## 2024-05-01 DIAGNOSIS — I480 Paroxysmal atrial fibrillation: Secondary | ICD-10-CM

## 2024-05-01 DIAGNOSIS — Z5181 Encounter for therapeutic drug level monitoring: Secondary | ICD-10-CM

## 2024-05-01 DIAGNOSIS — Z9889 Other specified postprocedural states: Secondary | ICD-10-CM

## 2024-05-01 MED ORDER — ONDANSETRON 4 MG PO TBDP: 4.0000 mg | ORAL_TABLET | Freq: Three times a day (TID) | ORAL | 0 refills | Status: DC | PRN

## 2024-05-01 MED ORDER — OXYCODONE HCL 5 MG PO TABS
5.0000 mg | ORAL_TABLET | ORAL | 0 refills | Status: DC | PRN
Start: 2024-05-01 — End: 2024-08-03

## 2024-05-01 NOTE — Discharge Instructions (Addendum)
 You have a fracture of your left patella in your right proximal humerus.  Wear the sling to your right arm.  Wear the knee immobilizer to your left leg.  Do not bear weight on your left leg unless you have a knee immobilizer on.  Take the pain medication as prescribed.  Follow-up with Dr. Vernetta next week as scheduled.  Return to the ED for worsening pain, weakness, numbness, tingling, any concerns.

## 2024-05-01 NOTE — ED Notes (Signed)
Reviewed discharge instructions, medications, and home care with pt. Pt verbalized understanding and had no further questions. Pt exited ED without complications.

## 2024-05-01 NOTE — ED Notes (Addendum)
 Knee immobilizer and shoulder sling already in place prior to pt's arrival. Pt declined using crutches. Stated will use cane at home instead.

## 2024-05-03 NOTE — Telephone Encounter (Signed)
 After rescheduling patients 10/31 appt with Kellie Humphrey, [name] d/t provider out office, patient asked for a follow up on this. Advised Dr. Wendel has seen the message and is talking with [facility] regarding this. I let her know I would forward this back, so we can get her an answer on this.

## 2024-05-04 ENCOUNTER — Other Ambulatory Visit: Payer: Self-pay | Admitting: Internal Medicine

## 2024-05-04 ENCOUNTER — Encounter: Payer: Self-pay | Admitting: Orthopaedic Surgery

## 2024-05-04 ENCOUNTER — Ambulatory Visit: Admitting: Orthopaedic Surgery

## 2024-05-04 ENCOUNTER — Telehealth: Payer: Self-pay | Admitting: Orthopaedic Surgery

## 2024-05-04 DIAGNOSIS — S82045A Nondisplaced comminuted fracture of left patella, initial encounter for closed fracture: Secondary | ICD-10-CM

## 2024-05-04 DIAGNOSIS — Z5181 Encounter for therapeutic drug level monitoring: Secondary | ICD-10-CM

## 2024-05-04 DIAGNOSIS — S42254A Nondisplaced fracture of greater tuberosity of right humerus, initial encounter for closed fracture: Secondary | ICD-10-CM | POA: Diagnosis not present

## 2024-05-04 DIAGNOSIS — Z9889 Other specified postprocedural states: Secondary | ICD-10-CM

## 2024-05-04 DIAGNOSIS — I480 Paroxysmal atrial fibrillation: Secondary | ICD-10-CM

## 2024-05-04 NOTE — Telephone Encounter (Signed)
 Pt had an appt today and Vernetta want her back in 2 wks. Please call pt with an work in by 8/20. Pt phone number is [phone]

## 2024-05-04 NOTE — Progress Notes (Signed)
 The patient is a 78 year old female who comes in today after her mechanical fall when she slipped and the grocery store on Saturday.  She is somewhat on Plavix  and aspirin .  She was seen in the emergency room and found to have a nondisplaced right shoulder greater tuberosity fracture of the proximal humerus and an inferior pole of the patella fracture on her left knee.  She was placed appropriately in a knee immobilizer on the left side in a sling on the right side.  She did talk to her cardiologist who suggested she stop at least her aspirin  for the short-term.  She is with her husband today who is also a patient of ours.  She is only taken Tylenol  for pain.  She has been icing as well.  She does report significant bruising.  Examination of her right shoulder shows is well located.  She does have significant bruising down into her elbow region.  She is able to move her elbow, hand and wrist.  Examination of her left knee shows that her extensor mechanism is intact.  There is bruising around the knee as well.  The pain is at the inferior pole the patella.  I did review the CT scan of her right shoulder and her left knee with her and her husband.  She has a small comminuted fracture of the inferior patella pole that is nondisplaced.  There is also fracture of the right shoulder greater tuberosity that is nondisplaced.  These are fractures that can be managed nonoperatively.  She still needs to limit any flexion of her left knee or abduction of the right shoulder or reaching overhead with the right arm.  She should wear her knee immobilizer when she sleeps and when she is walking around and keep her knee straight at all times.  Will see her back in 2 weeks and at that visit we will have just an AP single view of her right shoulder/proximal humerus and a lateral view of her left knee to assess the patella fracture.  At that visit we can likely transition her to a hinged knee brace on the left knee.  She can  come in and out of the brace in the sling for hygiene purposes.

## 2024-05-06 NOTE — Telephone Encounter (Signed)
 Medication list updated.

## 2024-05-06 NOTE — Addendum Note (Signed)
 Addended by: Donivin Wirt on: 05/06/2024 04:33 PM   Modules accepted: Orders

## 2024-05-18 ENCOUNTER — Other Ambulatory Visit (INDEPENDENT_AMBULATORY_CARE_PROVIDER_SITE_OTHER): Payer: Self-pay

## 2024-05-18 ENCOUNTER — Encounter: Payer: Self-pay | Admitting: Orthopaedic Surgery

## 2024-05-18 ENCOUNTER — Ambulatory Visit (INDEPENDENT_AMBULATORY_CARE_PROVIDER_SITE_OTHER): Admitting: Orthopaedic Surgery

## 2024-05-18 DIAGNOSIS — S82045A Nondisplaced comminuted fracture of left patella, initial encounter for closed fracture: Secondary | ICD-10-CM

## 2024-05-18 DIAGNOSIS — S42254A Nondisplaced fracture of greater tuberosity of right humerus, initial encounter for closed fracture: Secondary | ICD-10-CM | POA: Diagnosis not present

## 2024-05-18 NOTE — Progress Notes (Signed)
 The patient is a 78 year old female who is about 2-1/2 weeks out from mechanical fall and endorses to her when she slipped injuring her right shoulder and her left knee.  She sustained fractures to her right shoulder greater tuberosity and her left knee inferior pole the patella.  These can be treated conservatively and she has been in a sling for the right shoulder and a knee immobilizer for the left knee.  We saw her last 2 weeks ago soon after her fall and wanted to get new x-rays today.  On exam her right shoulder is well located.  She can abduct her shoulder and hold it abducted.  Her left knee has full extension.  We gently flexed her knee to about 45 degrees and she can extend it on her own.  A single view of the right shoulder shows a nondisplaced greater tuberosity fracture that shows some interval healing.  A single lateral view of the left knee shows a healing nondisplaced fracture of the inferior pole of the patella.  At this point she can be out of her sling but she knows to avoid overhead activities or reaching behind her with the right shoulder.  She will avoid any lifting greater than 5 pounds with that shoulder.  We will switch her to a hinged knee brace for the left knee with gentle range of motion of the left knee as she tolerates with still keeping it extended at night.  Will see her back in 3 weeks for a repeat lateral view of the left knee and a single AP view of the right shoulder.

## 2024-05-19 DIAGNOSIS — S42294A Other nondisplaced fracture of upper end of right humerus, initial encounter for closed fracture: Secondary | ICD-10-CM | POA: Diagnosis not present

## 2024-05-19 DIAGNOSIS — L729 Follicular cyst of the skin and subcutaneous tissue, unspecified: Secondary | ICD-10-CM | POA: Diagnosis not present

## 2024-05-19 DIAGNOSIS — S82002A Unspecified fracture of left patella, initial encounter for closed fracture: Secondary | ICD-10-CM | POA: Diagnosis not present

## 2024-05-19 DIAGNOSIS — M8000XA Age-related osteoporosis with current pathological fracture, unspecified site, initial encounter for fracture: Secondary | ICD-10-CM | POA: Diagnosis not present

## 2024-05-20 ENCOUNTER — Telehealth (HOSPITAL_COMMUNITY): Payer: Self-pay

## 2024-05-20 NOTE — Telephone Encounter (Signed)
 Auth Submission: NO AUTH NEEDED Site of care: Site of care: [facility] Payer: [employer] Medication & CPT/J Code(s) submitted: Prolia (Denosumab) [id number] Diagnosis Code: M81.0 Route of submission (phone, fax, portal):  Phone # Fax # Auth type: Buy/Bill HB Units/visits requested: 60mg  x 2 doses q 6 months Reference number:  Approval from: 05/20/24 to 09/28/24

## 2024-05-25 DIAGNOSIS — M7981 Nontraumatic hematoma of soft tissue: Secondary | ICD-10-CM | POA: Diagnosis not present

## 2024-05-25 DIAGNOSIS — Z85820 Personal history of malignant melanoma of skin: Secondary | ICD-10-CM | POA: Diagnosis not present

## 2024-05-25 DIAGNOSIS — L723 Sebaceous cyst: Secondary | ICD-10-CM | POA: Diagnosis not present

## 2024-05-25 DIAGNOSIS — Z85828 Personal history of other malignant neoplasm of skin: Secondary | ICD-10-CM | POA: Diagnosis not present

## 2024-06-01 ENCOUNTER — Other Ambulatory Visit (HOSPITAL_COMMUNITY): Payer: Self-pay | Admitting: *Deleted

## 2024-06-02 ENCOUNTER — Ambulatory Visit (HOSPITAL_COMMUNITY)
Admission: RE | Admit: 2024-06-02 | Discharge: 2024-06-02 | Disposition: A | Discharge status: Home / Self Care | Source: Ambulatory Visit | Attending: Internal Medicine | Admitting: Internal Medicine

## 2024-06-02 DIAGNOSIS — M810 Age-related osteoporosis without current pathological fracture: Secondary | ICD-10-CM | POA: Diagnosis not present

## 2024-06-02 MED ORDER — DENOSUMAB 60 MG/ML ~~LOC~~ SOSY
PREFILLED_SYRINGE | SUBCUTANEOUS | Status: AC
  Filled 2024-06-02: qty 1

## 2024-06-02 MED ORDER — DENOSUMAB 60 MG/ML ~~LOC~~ SOSY
60.0000 mg | PREFILLED_SYRINGE | Freq: Once | SUBCUTANEOUS | Status: AC
  Administered 2024-06-02: 60 mg via SUBCUTANEOUS

## 2024-06-13 ENCOUNTER — Ambulatory Visit (INDEPENDENT_AMBULATORY_CARE_PROVIDER_SITE_OTHER): Admitting: Podiatry

## 2024-06-13 ENCOUNTER — Encounter: Payer: Self-pay | Admitting: Podiatry

## 2024-06-13 VITALS — Ht 67.0 in | Wt 130.0 lb

## 2024-06-13 DIAGNOSIS — H04123 Dry eye syndrome of bilateral lacrimal glands: Secondary | ICD-10-CM | POA: Diagnosis not present

## 2024-06-13 DIAGNOSIS — H34232 Retinal artery branch occlusion, left eye: Secondary | ICD-10-CM | POA: Diagnosis not present

## 2024-06-13 DIAGNOSIS — L600 Ingrowing nail: Secondary | ICD-10-CM | POA: Diagnosis not present

## 2024-06-13 DIAGNOSIS — Z961 Presence of intraocular lens: Secondary | ICD-10-CM | POA: Diagnosis not present

## 2024-06-13 NOTE — Patient Instructions (Signed)

## 2024-06-13 NOTE — Progress Notes (Signed)
 Subjective:   Patient ID: Kellie Humphrey, female   DOB: 78 y.o.   MRN: [id number]   HPI Patient presents with painful ingrown toenail left big toe that has been present and has had different bouts of this over the years.  Patient states that he gets sore at times and makes it hard to wear shoe gear.  Patient does not smoke likes to be active   Review of Systems  All other systems reviewed and are negative.       Objective:  Physical Exam Vitals and nursing note reviewed.  Constitutional:      Appearance: She is well-developed.  Pulmonary:     Effort: Pulmonary effort is normal.  Musculoskeletal:        General: Normal range of motion.  Skin:    General: Skin is warm.  Neurological:     Mental Status: She is alert.     Neurovascular status was found to be intact muscle strength is found to be adequate range of motion adequate with the patient noted to have incurvation medial border left big toe with mild thickness of the nailbed itself and good digital perfusion well oriented x 3     Assessment:  Grown toenail deformity left hallux medial border with pain     Plan:  H&P reviewed deformity of the length discussed treatment options and recommended surgical correction.  I allow her to read consent form for correction and after review she signs and today I infiltrated the left big toe 60 mg Xylocaine  Marcaine  mixture with sterile prep done and using sterile instrumentation removed the medial border exposed matrix applied phenol 3 applications 30 seconds followed by alcohol  lavage sterile dressing gave instructions on soaks and to wear dressing 24 hours taken it off earlier if throbbing were to occur.  Encouraged to call with any questions concerns that may arise during healing

## 2024-06-14 ENCOUNTER — Encounter: Payer: Self-pay | Admitting: Podiatry

## 2024-06-14 ENCOUNTER — Telehealth: Payer: Self-pay | Admitting: Lab

## 2024-06-14 NOTE — Telephone Encounter (Signed)
 Patient would like a call back has some questions for you and only wants to speak to you.

## 2024-06-16 ENCOUNTER — Other Ambulatory Visit (INDEPENDENT_AMBULATORY_CARE_PROVIDER_SITE_OTHER): Payer: Self-pay

## 2024-06-16 ENCOUNTER — Ambulatory Visit: Admitting: Orthopaedic Surgery

## 2024-06-16 ENCOUNTER — Encounter: Payer: Self-pay | Admitting: Orthopaedic Surgery

## 2024-06-16 DIAGNOSIS — S42254A Nondisplaced fracture of greater tuberosity of right humerus, initial encounter for closed fracture: Secondary | ICD-10-CM | POA: Diagnosis not present

## 2024-06-16 DIAGNOSIS — S82045A Nondisplaced comminuted fracture of left patella, initial encounter for closed fracture: Secondary | ICD-10-CM

## 2024-06-16 NOTE — Progress Notes (Signed)
 The patient is an active 78 year old female who is getting close to 7 weeks status post mechanical fall in which she sustained a nondisplaced right shoulder greater tuberosity fracture and a nondisplaced left knee inferior pole the patella fracture.  She is doing very well and said that they are improving each week in terms of decreasing pain and improving her mobility overall.  She has been wearing a knee brace for her left knee but we have allow full range of motion.  She says her ADLs are getting easier as well.  On examination of her left knee her extension is full and her flexion is full and the knee feels stable.  She is sensitive to palpation of the inferior pole of the patella which is to be expected.  A lateral view of her left knee shows the fracture looks like it is healed or close to healing with no displacement at all.  Examination of the right shoulder does show some slight weakness in the rotator cuff but her shoulder is well located and she does abduct it well and rotate it well.  X-rays of the right shoulder show that the greater tuberosity fracture remains nondisplaced and looks like it is almost completely healed.  At this point we will send her to outpatient physical therapy to work on her balance and coordination and strengthening of her left knee as well as range of motion and strengthening of her right shoulder.  Will then see her back in 6 weeks to see how she is doing overall but no x-rays are needed.

## 2024-06-17 ENCOUNTER — Other Ambulatory Visit: Payer: Self-pay

## 2024-06-17 DIAGNOSIS — S82045A Nondisplaced comminuted fracture of left patella, initial encounter for closed fracture: Secondary | ICD-10-CM

## 2024-06-17 DIAGNOSIS — S42254A Nondisplaced fracture of greater tuberosity of right humerus, initial encounter for closed fracture: Secondary | ICD-10-CM

## 2024-06-20 ENCOUNTER — Telehealth: Admitting: Neurology

## 2024-06-28 DIAGNOSIS — Z23 Encounter for immunization: Secondary | ICD-10-CM | POA: Diagnosis not present

## 2024-07-05 ENCOUNTER — Encounter: Payer: Self-pay | Admitting: Physical Therapy

## 2024-07-05 ENCOUNTER — Ambulatory Visit: Admitting: Physical Therapy

## 2024-07-05 DIAGNOSIS — R2681 Unsteadiness on feet: Secondary | ICD-10-CM

## 2024-07-05 DIAGNOSIS — M25562 Pain in left knee: Secondary | ICD-10-CM | POA: Diagnosis not present

## 2024-07-05 DIAGNOSIS — M25611 Stiffness of right shoulder, not elsewhere classified: Secondary | ICD-10-CM

## 2024-07-05 DIAGNOSIS — M25511 Pain in right shoulder: Secondary | ICD-10-CM

## 2024-07-05 NOTE — Therapy (Signed)
 OUTPATIENT PHYSICAL THERAPY EVALUATION   Patient Name: Kellie Humphrey MRN: [id number] DOB:1945/11/13, 78 y.o., female Today's Date: 07/05/2024  END OF SESSION:  PT End of Session - 07/05/24 1139     Visit Number 1    Number of Visits 6    Date for Recertification  08/16/24    Authorization Type [employer]    Progress Note Due on Visit 10    PT Start Time 1100    PT Stop Time 1139    PT Time Calculation (min) 39 min    Activity Tolerance Patient tolerated treatment well    Behavior During Therapy [facility] for tasks assessed/performed          Past Medical History:  Diagnosis Date   Anxiety disorder    Arthritis    Breast cancer (HCC) 07/25/2008   Cancer (HCC)    Cataract    Essential tremor    /familial   Heart murmur 2021   corrected with valve surgery   Hypothyroidism    Interstitial cystitis    Neuropathy    Nonrheumatic mitral valve regurgitation    PAC (premature atrial contraction)    Paresthesias 07/29/2014   Personal history of radiation therapy    PREMATURE ATRIAL CONTRACTIONS 05/08/2009   Qualifier: Diagnosis of  By: Buell, RN, [name]     Radiculopathy, cervical region    RLS (restless legs syndrome)    S/P minimally-invasive mitral valve repair 02/10/2020   Complex valvuloplasty including artificial Gore-tex neochord placement x8 with edge-to-edge suture plication of anterior commissure and 32 mm [name] 4D ring annuloplasty via right mini thoracotomy approach   Thyroid  disease    Varicose veins of right lower extremity with complications 03/25/2016   Past Surgical History:  Procedure Laterality Date   bilateral cataract repair  july/aug 2021   Dr Milan   BREAST BIOPSY  2009   BREAST LUMPECTOMY  07/25/2008   CARDIAC CATHETERIZATION  2021   DILATION AND CURETTAGE OF UTERUS     age 74   EYE SURGERY Left    Pterygium   HEMANGIOMA EXCISION Left 1990   arm   LEFT ATRIAL APPENDAGE OCCLUSION N/A 01/28/2024   Procedure: LEFT ATRIAL  APPENDAGE OCCLUSION;  Surgeon: Kennyth Chew, MD;  Location: [facility] INVASIVE CV LAB;  Service: Cardiovascular;  Laterality: N/A;   MELANOMA EXCISION Right 1980   thigh   MITRAL VALVE REPAIR Right 02/10/2020   Procedure: MINIMALLY INVASIVE MITRAL VALVE REPAIR (MVR) using Memo 4D 32 MM Mitral Valve Ring.;  Surgeon: Dusty Sudie DEL, MD;  Location: [facility] OR;  Service: Open Heart Surgery;  Laterality: Right;   NECK SURGERY  10/2009   C5-6 fusion   RIGHT/LEFT HEART CATH AND CORONARY ANGIOGRAPHY N/A 01/04/2020   Procedure: RIGHT/LEFT HEART CATH AND CORONARY ANGIOGRAPHY;  Surgeon: Verlin Lonni BIRCH, MD;  Location: [facility] INVASIVE CV LAB;  Service: Cardiovascular;  Laterality: N/A;   ROBOTIC ASSISTED BILATERAL SALPINGO OOPHERECTOMY Bilateral 10/01/2021   Procedure: XI ROBOTIC ASSISTED BILATERAL SALPINGO OOPHORECTOMY;  Surgeon: Viktoria Comer SAUNDERS, MD;  Location: [facility] ORS;  Service: Gynecology;  Laterality: Bilateral;   SHOULDER ARTHROSCOPY Left 2016   spider vein treatment     TEE WITHOUT CARDIOVERSION N/A 06/30/2018   Procedure: TRANSESOPHAGEAL ECHOCARDIOGRAM (TEE);  Surgeon: Shlomo Wilbert SAUNDERS, MD;  Location: [facility] ENDOSCOPY;  Service: Cardiovascular;  Laterality: N/A;   TEE WITHOUT CARDIOVERSION N/A 12/12/2019   Procedure: TRANSESOPHAGEAL ECHOCARDIOGRAM (TEE);  Surgeon: Maranda Leim DEL, MD;  Location: [facility] ENDOSCOPY;  Service: Cardiovascular;  Laterality: N/A;  TEE WITHOUT CARDIOVERSION N/A 02/10/2020   Procedure: TRANSESOPHAGEAL ECHOCARDIOGRAM (TEE);  Surgeon: Dusty Sudie DEL, MD;  Location: [facility] OR;  Service: Open Heart Surgery;  Laterality: N/A;   TONSILLECTOMY     as a child   TRANSESOPHAGEAL ECHOCARDIOGRAM (CATH LAB) N/A 01/28/2024   Procedure: TRANSESOPHAGEAL ECHOCARDIOGRAM;  Surgeon: Kennyth Chew, MD;  Location: [facility] INVASIVE CV LAB;  Service: Cardiovascular;  Laterality: N/A;   uterine fibroids removed  2012   Patient Active Problem List   Diagnosis Date Noted   Elevated serum creatinine 11/21/2023    Retinal artery occlusion, branch, left 11/21/2023   Palpitations 08/05/2022   PVC (premature ventricular contraction) 06/05/2022   Adnexal mass    Cyst of right ovary 08/21/2021   Weight loss 08/21/2021   Nausea without vomiting 08/21/2021   Encounter for removal of sutures 02/23/2020   Encounter for therapeutic drug monitoring 02/20/2020   Atrial fibrillation (HCC) 02/20/2020   S/P minimally-invasive mitral valve repair 02/10/2020   Nonrheumatic mitral valve regurgitation    Varicose veins of right lower extremity with complications 03/25/2016   Paresthesias 07/29/2014   Tremor 12/08/2013   Personal history of breast cancer 09/06/2013   Dense breast 09/06/2013   Malignant neoplasm of lower-outer quadrant of right breast of female, estrogen receptor positive (HCC) 02/11/2012   Severe mitral valve regurgitation 05/08/2009   PREMATURE ATRIAL CONTRACTIONS 05/08/2009    PCP: Loreli Elsie JONETTA Mickey., MD  REFERRING PROVIDER: Vernetta Lonni GRADE*  REFERRING DIAG: D57.745J (ICD-10-CM) - Closed nondisplaced fracture of greater tuberosity of right humerus, initial encounter S82.045A (ICD-10-CM) - Closed nondisplaced comminuted fracture of left patella, initial encounter  Rationale for Evaluation and Treatment: Rehabilitation  THERAPY DIAG:  Acute pain of right shoulder - Plan: PT plan of care cert/re-cert  Stiffness of right shoulder, not elsewhere classified - Plan: PT plan of care cert/re-cert  Unsteadiness on feet - Plan: PT plan of care cert/re-cert  Acute pain of left knee - Plan: PT plan of care cert/re-cert  ONSET DATE: 04/30/24   SUBJECTIVE:                                                                                                                                                                                           SUBJECTIVE STATEMENT: Pt is a 78 year old female who sustained a mechanical fall on 04/30/24 in which she sustained a nondisplaced right shoulder greater  tuberosity fracture and a nondisplaced left knee inferior pole the patella fracture.  She thinks her foot stuck to the floor causing the fall.  She c/o difficulty reaching overhead, and some knee aching with difficulty going up the stairs.  PERTINENT HISTORY:  cancer, essential tremor, neuropathy, PAC, nonrheumatic mitral valve regurgitation s/p mitral valve repair (2021), radiculopathy (cervical)  PAIN:  Are you having pain? Yes: NPRS scale: 0 currently, up to 8/10; aching in Lt knee Pain location: Rt shoulder Pain description: sharp, like something is stuck Aggravating factors: overhead activities Relieving factors: resting, tylenol   PRECAUTIONS:  Fall  RED FLAGS: None   WEIGHT BEARING RESTRICTIONS:  No  FALLS:  Has patient fallen in last 6 months? Yes. Number of falls 1 with injury  LIVING ENVIRONMENT: Lives with: lives with their spouse Lives in: House/apartment (condo) Stairs: doesn't use them  OCCUPATION:  Retired  PLOF:  Independent and Leisure: go to house in [place], walking 2-3 miles 5 days a week; no strength training  PATIENT GOALS:  Improve use of Rt arm, improve pain   OBJECTIVE:  Note: Objective measures were completed at Evaluation unless otherwise noted.  DIAGNOSTIC FINDINGS:  X-rays of the right shoulder show that the greater tuberosity fracture remains nondisplaced and looks like it is almost completely healed.   X-rays of her left knee shows the fracture looks like it is healed or close to healing with no displacement at all.  PATIENT SURVEYS:  Patient-Specific Activity Scoring Scheme  0 represents "unable to perform." 10 represents "able to perform at prior level. 0 1 2 3 4 5 6 7 8 9  10 (Date and Score)   Activity Eval     1. Lifting Rt arm overhead  4    2. Walking/Stairs  7    3. Balance 5   Score 5.33    Total score = sum of the activity scores/number of activities Minimum detectable change (90%CI) for average score = 2  points Minimum detectable change (90%CI) for single activity score = 3 points    COGNITIVE STATUS: Within functional limits for tasks assessed   POSTURE:  rounded shoulders and forward head  HAND DOMINANCE:  Right  GAIT: 07/05/24 Comments: Amb without AD independently; no significant balance deficits noted today but will formally assess next session.    UPPER EXTREMITY ROM:  ROM (A/P) Right eval Left eval  Shoulder flexion 110 (pain) 138  Shoulder extension    Shoulder abduction 90/110 (pain with A) 145  Shoulder internal rotation T11/12 T7/8  Shoulder external rotation     (Blank rows = not tested)   UPPER EXTREMITY MMT:  MMT Right eval Left eval  Shoulder flexion 3/5   Shoulder abduction 3/5   Shoulder internal rotation    Shoulder external rotation 3/5    (Blank rows = not tested)     LOWER EXTREMITY MMT:    MMT Right eval Left eval  Hip flexion 5/5 5/5  Hip extension    Hip abduction    Hip adduction    Hip internal rotation    Hip external rotation    Knee flexion 5/5 5/5  Knee extension 5/5 4/5  Ankle dorsiflexion 4/5 4/5  Ankle plantarflexion    Ankle inversion    Ankle eversion     (Blank rows = not tested)    SPECIAL TESTS:  07/05/24 Upper Extremity Rotator cuff assessment: Empty can test: positive  and Full can test: positive  (on right)  FUNCTIONAL TESTS:  07/05/24 deferred today   TREATMENT:  DATE:  07/05/24 TherEx See HEP - demonstrated with trial reps performed PRN, mod cues for comprehension    Self Care Educated on POC and clinical findings     PATIENT EDUCATION:  Education details: HEP Person educated: Patient Education method: Programmer, multimedia, Facilities manager, and Handouts Education comprehension: verbalized understanding, returned demonstration, and needs further education  HOME EXERCISE  PROGRAM: Access Code: [email or web address] URL: [email or web address] Date: 07/05/2024 Prepared by: Corean Ku  Exercises - Heel Toe Raises with Counter Support  - 1 x daily - 7 x weekly - 2 sets - 10 reps - 2-3 sec hold - Seated Straight Leg Raise   - 1 x daily - 7 x weekly - 2 sets - 10 reps - 2-3 sec hold - Supine Quadriceps Stretch with Strap on Table  - 1 x daily - 7 x weekly - 1 sets - 3 reps - 30 sec hold - Standing Shoulder Abduction ROM with Dowel (Mirrored)  - 1-2 x daily - 7 x weekly - 2 sets - 10 reps - 1-2 sec hold - Standing Shoulder Flexion AAROM with Dowel  - 1-2 x daily - 7 x weekly - 2 sets - 10 reps - 1-2 sec hold   ASSESSMENT:  CLINICAL IMPRESSION: Patient is a 78 y.o. female who was seen today for physical therapy evaluation and treatment for Rt humerus fx and Lt patellar fx. She demonstrates decreased ROM and strength, as well as c/o balance deficits with recent fall affecting functional mobility.  She will benefit from PT to address deficits listed.     OBJECTIVE IMPAIRMENTS: decreased balance, decreased mobility, decreased ROM, decreased strength, impaired UE functional use, postural dysfunction, and pain.   ACTIVITY LIMITATIONS: carrying, lifting, stairs, bathing, toileting, reach over head, hygiene/grooming, and locomotion level  PARTICIPATION LIMITATIONS: meal prep, cleaning, laundry, shopping, and community activity  PERSONAL FACTORS: Age, Time since onset of injury/illness/exacerbation, and 3+ comorbidities: cancer, essential tremor, neuropathy, PAC, nonrheumatic mitral valve regurgitation s/p mitral valve repair (2021), radiculopathy (cervical) are also affecting patient's functional outcome.   REHAB POTENTIAL: Good  CLINICAL DECISION MAKING: Evolving/moderate complexity  EVALUATION COMPLEXITY: Moderate   GOALS: Goals reviewed with patient? Yes  SHORT TERM GOALS: Target date: 07/26/2024  Independent with initial HEP Goal status:  INITIAL  2.  Perform Rt shoulder AROM without pain and improved 10 deg abduction and flexion Goal status: INITIAL  3. Formal balance assessment with LTGs to be written as appropriate Goal status: INITIAL  LONG TERM GOALS: Target date: 08/16/2024  Independent with final HEP Goal status: INITIAL  2.  PSFS score improved by 2 points Goal status: INITIAL  3.  Rt shoulder strength improved to 4/5 for improved function and mobility Goal status: INIITAL  4.  Report pain < 3/10 with overhead reaching for improved function Goal status: INITIAL  5.  Demonstrate ability to negotiate stairs without increase in pain. Goal status: INITIAL    PLAN:  PT FREQUENCY: 1x/week  PT DURATION: 6 weeks  PLANNED INTERVENTIONS: [id number]- PT Re-evaluation, 97750- Physical Performance Testing, 97110-Therapeutic exercises, 97530- Therapeutic activity, [id number]- Neuromuscular re-education, 97535- Self Care, [id number]- Manual therapy, [id number]- Gait training, [phone]- Aquatic Therapy, [phone]- Electrical stimulation (unattended), [phone] (1-2 muscles), [id number] (3+ muscles)- Dry Needling, Patient/Family education, Balance training, Stair training, Taping, Cryotherapy, and Moist heat.  PLAN FOR NEXT SESSION: Review HEP, needs BERG or DGI, 5x STS   NEXT MD VISIT: 07/28/24   Corean JULIANNA Ku, PT, DPT 07/05/24 12:16 PM

## 2024-07-08 ENCOUNTER — Telehealth: Admitting: Neurology

## 2024-07-10 ENCOUNTER — Encounter: Payer: Self-pay | Admitting: Internal Medicine

## 2024-07-21 ENCOUNTER — Ambulatory Visit: Admitting: Rehabilitative and Restorative Service Providers"

## 2024-07-21 ENCOUNTER — Encounter: Payer: Self-pay | Admitting: Rehabilitative and Restorative Service Providers"

## 2024-07-21 DIAGNOSIS — R2681 Unsteadiness on feet: Secondary | ICD-10-CM | POA: Diagnosis not present

## 2024-07-21 DIAGNOSIS — M25511 Pain in right shoulder: Secondary | ICD-10-CM

## 2024-07-21 DIAGNOSIS — M25611 Stiffness of right shoulder, not elsewhere classified: Secondary | ICD-10-CM

## 2024-07-21 DIAGNOSIS — M25562 Pain in left knee: Secondary | ICD-10-CM | POA: Diagnosis not present

## 2024-07-21 NOTE — Therapy (Signed)
 OUTPATIENT PHYSICAL THERAPY TREATMENT   Patient Name: Kellie Humphrey MRN: [id number] DOB:1945/11/09, 78 y.o., female Today's Date: 07/21/2024  END OF SESSION:  PT End of Session - 07/21/24 1144     Visit Number 2    Number of Visits 6    Date for Recertification  08/16/24    Authorization Type [employer]    Progress Note Due on Visit 10    PT Start Time 1143    PT Stop Time 1232    PT Time Calculation (min) 49 min    Activity Tolerance Patient tolerated treatment well;No increased pain    Behavior During Therapy [facility] for tasks assessed/performed           Past Medical History:  Diagnosis Date   Anxiety disorder    Arthritis    Breast cancer (HCC) 07/25/2008   Cancer (HCC)    Cataract    Essential tremor    /familial   Heart murmur 2021   corrected with valve surgery   Hypothyroidism    Interstitial cystitis    Neuropathy    Nonrheumatic mitral valve regurgitation    PAC (premature atrial contraction)    Paresthesias 07/29/2014   Personal history of radiation therapy    PREMATURE ATRIAL CONTRACTIONS 05/08/2009   Qualifier: Diagnosis of  By: Buell, RN, [name]     Radiculopathy, cervical region    RLS (restless legs syndrome)    S/P minimally-invasive mitral valve repair 02/10/2020   Complex valvuloplasty including artificial Gore-tex neochord placement x8 with edge-to-edge suture plication of anterior commissure and 32 mm [name] 4D ring annuloplasty via right mini thoracotomy approach   Thyroid  disease    Varicose veins of right lower extremity with complications 03/25/2016   Past Surgical History:  Procedure Laterality Date   bilateral cataract repair  july/aug 2021   Dr Milan   BREAST BIOPSY  2009   BREAST LUMPECTOMY  07/25/2008   CARDIAC CATHETERIZATION  2021   DILATION AND CURETTAGE OF UTERUS     age 57   EYE SURGERY Left    Pterygium   HEMANGIOMA EXCISION Left 1990   arm   LEFT ATRIAL APPENDAGE OCCLUSION N/A 01/28/2024    Procedure: LEFT ATRIAL APPENDAGE OCCLUSION;  Surgeon: Kennyth Chew, MD;  Location: [facility] INVASIVE CV LAB;  Service: Cardiovascular;  Laterality: N/A;   MELANOMA EXCISION Right 1980   thigh   MITRAL VALVE REPAIR Right 02/10/2020   Procedure: MINIMALLY INVASIVE MITRAL VALVE REPAIR (MVR) using Memo 4D 32 MM Mitral Valve Ring.;  Surgeon: Dusty Sudie DEL, MD;  Location: [facility] OR;  Service: Open Heart Surgery;  Laterality: Right;   NECK SURGERY  10/2009   C5-6 fusion   RIGHT/LEFT HEART CATH AND CORONARY ANGIOGRAPHY N/A 01/04/2020   Procedure: RIGHT/LEFT HEART CATH AND CORONARY ANGIOGRAPHY;  Surgeon: Verlin Lonni BIRCH, MD;  Location: [facility] INVASIVE CV LAB;  Service: Cardiovascular;  Laterality: N/A;   ROBOTIC ASSISTED BILATERAL SALPINGO OOPHERECTOMY Bilateral 10/01/2021   Procedure: XI ROBOTIC ASSISTED BILATERAL SALPINGO OOPHORECTOMY;  Surgeon: Viktoria Comer SAUNDERS, MD;  Location: [facility] ORS;  Service: Gynecology;  Laterality: Bilateral;   SHOULDER ARTHROSCOPY Left 2016   spider vein treatment     TEE WITHOUT CARDIOVERSION N/A 06/30/2018   Procedure: TRANSESOPHAGEAL ECHOCARDIOGRAM (TEE);  Surgeon: Shlomo Wilbert SAUNDERS, MD;  Location: [facility] ENDOSCOPY;  Service: Cardiovascular;  Laterality: N/A;   TEE WITHOUT CARDIOVERSION N/A 12/12/2019   Procedure: TRANSESOPHAGEAL ECHOCARDIOGRAM (TEE);  Surgeon: Maranda Leim DEL, MD;  Location: [facility] ENDOSCOPY;  Service: Cardiovascular;  Laterality: N/A;   TEE WITHOUT CARDIOVERSION N/A 02/10/2020   Procedure: TRANSESOPHAGEAL ECHOCARDIOGRAM (TEE);  Surgeon: Dusty Sudie DEL, MD;  Location: [facility] OR;  Service: Open Heart Surgery;  Laterality: N/A;   TONSILLECTOMY     as a child   TRANSESOPHAGEAL ECHOCARDIOGRAM (CATH LAB) N/A 01/28/2024   Procedure: TRANSESOPHAGEAL ECHOCARDIOGRAM;  Surgeon: Kennyth Chew, MD;  Location: [facility] INVASIVE CV LAB;  Service: Cardiovascular;  Laterality: N/A;   uterine fibroids removed  2012   Patient Active Problem List   Diagnosis Date Noted   Elevated serum  creatinine 11/21/2023   Retinal artery occlusion, branch, left 11/21/2023   Palpitations 08/05/2022   PVC (premature ventricular contraction) 06/05/2022   Adnexal mass    Cyst of right ovary 08/21/2021   Weight loss 08/21/2021   Nausea without vomiting 08/21/2021   Encounter for removal of sutures 02/23/2020   Encounter for therapeutic drug monitoring 02/20/2020   Atrial fibrillation (HCC) 02/20/2020   S/P minimally-invasive mitral valve repair 02/10/2020   Nonrheumatic mitral valve regurgitation    Varicose veins of right lower extremity with complications 03/25/2016   Paresthesias 07/29/2014   Tremor 12/08/2013   Personal history of breast cancer 09/06/2013   Dense breast 09/06/2013   Malignant neoplasm of lower-outer quadrant of right breast of female, estrogen receptor positive (HCC) 02/11/2012   Severe mitral valve regurgitation 05/08/2009   PREMATURE ATRIAL CONTRACTIONS 05/08/2009    PCP: Loreli Elsie JONETTA Mickey., MD  REFERRING PROVIDER: Vernetta Lonni GRADE*  REFERRING DIAG: D57.745J (ICD-10-CM) - Closed nondisplaced fracture of greater tuberosity of right humerus, initial encounter S82.045A (ICD-10-CM) - Closed nondisplaced comminuted fracture of left patella, initial encounter  Rationale for Evaluation and Treatment: Rehabilitation  THERAPY DIAG:  Acute pain of right shoulder  Stiffness of right shoulder, not elsewhere classified  Unsteadiness on feet  Acute pain of left knee  ONSET DATE: 04/30/24   SUBJECTIVE:                                                                                                                                                                                           SUBJECTIVE STATEMENT: Kellie Humphrey reports progress with her day 1 home exercises.  Her left knee is her biggest area of concern.  Pt is a 78 year old female who sustained a mechanical fall on 04/30/24 in which she sustained a nondisplaced right shoulder greater tuberosity fracture  and a nondisplaced left knee inferior pole the patella fracture.  She thinks her foot stuck to the floor causing the fall.  She c/o difficulty reaching overhead, and some knee aching with difficulty going up the stairs.   PERTINENT HISTORY:  cancer,  essential tremor, neuropathy, PAC, nonrheumatic mitral valve regurgitation s/p mitral valve repair (2021), radiculopathy (cervical)  PAIN:  Are you having pain? Yes: NPRS scale: 2-7/10 Rt shoulder; 0-7/10 Lt knee this week Pain location: Rt Shoulder & Lt Knee Pain description: sharp, like something is stuck Aggravating factors: overhead activities, change of position with the knee Relieving factors: resting, Tylenol   PRECAUTIONS:  Fall  RED FLAGS: None   WEIGHT BEARING RESTRICTIONS:  No  FALLS:  Has patient fallen in last 6 months? Yes. Number of falls 1 with injury  LIVING ENVIRONMENT: Lives with: lives with their spouse Lives in: House/apartment (condo) Stairs: doesn't use them  OCCUPATION:  Retired  PLOF:  Independent and Leisure: go to house in [employer] [place] [employer], walking 2-3 miles 5 days a week; no strength training  PATIENT GOALS:  Improve use of Rt arm, improve pain   OBJECTIVE:  Note: Objective measures were completed at Evaluation unless otherwise noted.  DIAGNOSTIC FINDINGS:  X-rays of the right shoulder show that the greater tuberosity fracture remains nondisplaced and looks like it is almost completely healed.   X-rays of her left knee shows the fracture looks like it is healed or close to healing with no displacement at all.  PATIENT SURVEYS:  Patient-Specific Activity Scoring Scheme  0 represents "unable to perform." 10 represents "able to perform at prior level. 0 1 2 3 4 5 6 7 8 9  10 (Date and Score)   Activity Eval     1. Lifting Rt arm overhead  4    2. Walking/Stairs  7    3. Balance 5   Score 5.33    Total score = sum of the activity scores/number of activities Minimum detectable change  (90%CI) for average score = 2 points Minimum detectable change (90%CI) for single activity score = 3 points    COGNITIVE STATUS: Within functional limits for tasks assessed   POSTURE:  rounded shoulders and forward head  HAND DOMINANCE:  Right  GAIT: 07/05/24 Comments: Amb without AD independently; no significant balance deficits noted today but will formally assess next session.    UPPER EXTREMITY ROM:  ROM (A/P) Right eval Left eval Left/Right 07/21/2024  Shoulder flexion 110 (pain) 138 150/130  Shoulder extension     Shoulder abduction 90/110 (pain with A) 145   Shoulder internal rotation T11/12 T7/8 70/45  Shoulder external rotation   90/90  Horizontal adduction   50/30  Knee flexion   142/146  Knee extension   0/0   (Blank rows = not tested)   UPPER EXTREMITY MMT:  MMT Right eval Left/Right 07/21/2024 in pounds  Shoulder flexion 3/5   Shoulder abduction 3/5   Shoulder internal rotation  17.3/18.0  Shoulder external rotation 3/5 9.8/9.4   (Blank rows = not tested)     LOWER EXTREMITY MMT:    MMT Right eval Left eval Left/Right 07/21/2024 in pounds  Hip flexion 5/5 5/5   Hip extension     Hip abduction     Hip adduction     Hip internal rotation     Hip external rotation     Knee flexion 5/5 5/5   Knee extension 5/5 4/5 32.4/53.3  Ankle dorsiflexion 4/5 4/5   Ankle plantarflexion     Ankle inversion     Ankle eversion      (Blank rows = not tested)    SPECIAL TESTS:  07/05/24 Upper Extremity Rotator cuff assessment: Empty can test: positive  and Full can test: positive  (on  right)  FUNCTIONAL TESTS:  07/05/24 deferred today   TREATMENT:                                                                                                                              DATE:  07/21/2024 Seated straight leg raises 3 sets of 5 for 3 seconds (pillows for support) Prone quadriceps stretch 2 x 20 seconds  Functional Activities: Supine shoulder  flexion with dowel (for reaching and overhead function, palms in, protract 1st) 10 x 5 seconds Thumb up the back/shoulder IR (for reaching back) 10 x 10 seconds Posterior capsule stretch (for reaching) 5 x 10 seconds  02464: Verbally reviewed day 1 HEP, changes made today, examination findings and HEP changes based on progress and re-exam   07/05/24 TherEx See HEP - demonstrated with trial reps performed PRN, mod cues for comprehension    Self Care Educated on POC and clinical findings     PATIENT EDUCATION:  Education details: HEP Person educated: Patient Education method: Programmer, multimedia, Facilities manager, and Handouts Education comprehension: verbalized understanding, returned demonstration, and needs further education  HOME EXERCISE PROGRAM: Access Code: [email or web address] URL: [email or web address] Date: 07/21/2024 Prepared by: Lamar Ivory  Exercises - Heel Toe Raises with Counter Support  - 1 x daily - 1 x weekly - 2 sets - 10 reps - 2-3 sec hold - Seated Straight Leg Raise   - 1-2 x daily - 7 x weekly - 2-5 sets - 10 reps - 2-3 sec hold - Supine Quadriceps Stretch with Strap on Table  - 1 x daily - 1 x weekly - 1 sets - 3 reps - 30 sec hold - Standing Shoulder Abduction ROM with Dowel (Mirrored)  - 1-2 x daily - 1 x weekly - 2 sets - 10 reps - 1-2 sec hold - Standing Shoulder Flexion AAROM with Dowel  - 2 x daily - 7 x weekly - 2 sets - 10 reps - 5 sec hold - Small Range Straight Leg Raise  - 1 x daily - 7 x weekly - 3-5 sets - 10 reps - 3 seconds hold - Standing Shoulder Internal Rotation Stretch with Hands Behind Back  - 2 x daily - 7 x weekly - 1 sets - 10 reps - 10 seconds hold - Standing Shoulder Posterior Capsule Stretch  - 2 x daily - 7 x weekly - 1 sets - 10 reps - 10 hold - Shoulder External Rotation with Anchored Resistance  - 1 x daily - 7 x weekly - 2 sets - 10 reps - 3 hold   ASSESSMENT:  CLINICAL IMPRESSION: Tenley reports progress since starting  supervised physical therapy.  She had better shoulder shoulder and knee active range of motion as assessed object assessed objectively today.  We also a step we also established an objective baseline objective baseline for quadriceps and rotator cuff strength and made changes to her home exercise program based on progress thus far p.o. thus far.  Josefa appears to be on track to meet long-term goal long-term goals established at evaluation.  Patient is a 78 y.o. female who was seen today for physical therapy evaluation and treatment for Rt humerus fx and Lt patellar fx. She demonstrates decreased ROM and strength, as well as c/o balance deficits with recent fall affecting functional mobility.  She will benefit from PT to address deficits listed.     OBJECTIVE IMPAIRMENTS: decreased balance, decreased mobility, decreased ROM, decreased strength, impaired UE functional use, postural dysfunction, and pain.   ACTIVITY LIMITATIONS: carrying, lifting, stairs, bathing, toileting, reach over head, hygiene/grooming, and locomotion level  PARTICIPATION LIMITATIONS: meal prep, cleaning, laundry, shopping, and community activity  PERSONAL FACTORS: Age, Time since onset of injury/illness/exacerbation, and 3+ comorbidities: cancer, essential tremor, neuropathy, PAC, nonrheumatic mitral valve regurgitation s/p mitral valve repair (2021), radiculopathy (cervical) are also affecting patient's functional outcome.   REHAB POTENTIAL: Good  CLINICAL DECISION MAKING: Evolving/moderate complexity  EVALUATION COMPLEXITY: Moderate   GOALS: Goals reviewed with patient? Yes  SHORT TERM GOALS: Target date: 07/26/2024  Independent with initial HEP Goal status: INITIAL  2.  Perform Rt shoulder AROM without pain and improved 10 deg abduction and flexion Goal status: INITIAL  3. Formal balance assessment with LTGs to be written as appropriate Goal status: INITIAL  LONG TERM GOALS: Target date:  08/16/2024  Independent with final HEP Goal status: Met 07/21/2024  2.  PSFS score improved by 2 points Goal status: INITIAL  3.  Rt shoulder strength improved to 4/5 for improved function and mobility Goal status: INIITAL  4.  Report pain < 3/10 with overhead reaching for improved function Goal status: INITIAL  5.  Demonstrate ability to negotiate stairs without increase in pain. Goal status: INITIAL    PLAN:  PT FREQUENCY: 1x/week  PT DURATION: 6 weeks  PLANNED INTERVENTIONS: 97164- PT Re-evaluation, 97750- Physical Performance Testing, 97110-Therapeutic exercises, 97530- Therapeutic activity, [id number]- Neuromuscular re-education, 97535- Self Care, [id number]- Manual therapy, [id number]- Gait training, [phone]- Aquatic Therapy, [phone]- Electrical stimulation (unattended), [phone] (1-2 muscles), [id number] (3+ muscles)- Dry Needling, Patient/Family education, Balance training, Stair training, Taping, Cryotherapy, and Moist heat.  PLAN FOR NEXT SESSION: Review HEP, needs BERG or DGI, 5x STS. Assess current home exercises and make modifications as needed.   NEXT MD VISIT: 07/28/24  Myer LELON Ivory, PT, MPT 07/21/24 2:41 PM

## 2024-07-28 ENCOUNTER — Encounter: Payer: Self-pay | Admitting: Rehabilitative and Restorative Service Providers"

## 2024-07-28 ENCOUNTER — Ambulatory Visit: Admitting: Rehabilitative and Restorative Service Providers"

## 2024-07-28 ENCOUNTER — Encounter: Payer: Self-pay | Admitting: Orthopaedic Surgery

## 2024-07-28 ENCOUNTER — Ambulatory Visit: Admitting: Orthopaedic Surgery

## 2024-07-28 DIAGNOSIS — R2681 Unsteadiness on feet: Secondary | ICD-10-CM | POA: Diagnosis not present

## 2024-07-28 DIAGNOSIS — S82045A Nondisplaced comminuted fracture of left patella, initial encounter for closed fracture: Secondary | ICD-10-CM

## 2024-07-28 DIAGNOSIS — M25562 Pain in left knee: Secondary | ICD-10-CM

## 2024-07-28 DIAGNOSIS — M25611 Stiffness of right shoulder, not elsewhere classified: Secondary | ICD-10-CM | POA: Diagnosis not present

## 2024-07-28 DIAGNOSIS — S42254A Nondisplaced fracture of greater tuberosity of right humerus, initial encounter for closed fracture: Secondary | ICD-10-CM

## 2024-07-28 DIAGNOSIS — M25511 Pain in right shoulder: Secondary | ICD-10-CM | POA: Diagnosis not present

## 2024-07-28 NOTE — Therapy (Signed)
 OUTPATIENT PHYSICAL THERAPY TREATMENT   Patient Name: Mickaela Starlin MRN: [id number] DOB:07/12/1946, 78 y.o., female Today's Date: 07/28/2024  END OF SESSION:  PT End of Session - 07/28/24 1515     Visit Number 3    Number of Visits 6    Date for Recertification  08/16/24    Authorization Type [employer]    Progress Note Due on Visit 10    PT Start Time 1514    PT Stop Time 1600    PT Time Calculation (min) 46 min    Activity Tolerance Patient tolerated treatment well;No increased pain    Behavior During Therapy [facility] for tasks assessed/performed            Past Medical History:  Diagnosis Date   Anxiety disorder    Arthritis    Breast cancer (HCC) 07/25/2008   Cancer (HCC)    Cataract    Essential tremor    /familial   Heart murmur 2021   corrected with valve surgery   Hypothyroidism    Interstitial cystitis    Neuropathy    Nonrheumatic mitral valve regurgitation    PAC (premature atrial contraction)    Paresthesias 07/29/2014   Personal history of radiation therapy    PREMATURE ATRIAL CONTRACTIONS 05/08/2009   Qualifier: Diagnosis of  By: Buell, RN, [name]     Radiculopathy, cervical region    RLS (restless legs syndrome)    S/P minimally-invasive mitral valve repair 02/10/2020   Complex valvuloplasty including artificial Gore-tex neochord placement x8 with edge-to-edge suture plication of anterior commissure and 32 mm [name] 4D ring annuloplasty via right mini thoracotomy approach   Thyroid  disease    Varicose veins of right lower extremity with complications 03/25/2016   Past Surgical History:  Procedure Laterality Date   bilateral cataract repair  july/aug 2021   Dr Milan   BREAST BIOPSY  2009   BREAST LUMPECTOMY  07/25/2008   CARDIAC CATHETERIZATION  2021   DILATION AND CURETTAGE OF UTERUS     age 3   EYE SURGERY Left    Pterygium   HEMANGIOMA EXCISION Left 1990   arm   LEFT ATRIAL APPENDAGE OCCLUSION N/A 01/28/2024    Procedure: LEFT ATRIAL APPENDAGE OCCLUSION;  Surgeon: Kennyth Chew, MD;  Location: [facility] INVASIVE CV LAB;  Service: Cardiovascular;  Laterality: N/A;   MELANOMA EXCISION Right 1980   thigh   MITRAL VALVE REPAIR Right 02/10/2020   Procedure: MINIMALLY INVASIVE MITRAL VALVE REPAIR (MVR) using Memo 4D 32 MM Mitral Valve Ring.;  Surgeon: Dusty Sudie DEL, MD;  Location: [facility] OR;  Service: Open Heart Surgery;  Laterality: Right;   NECK SURGERY  10/2009   C5-6 fusion   RIGHT/LEFT HEART CATH AND CORONARY ANGIOGRAPHY N/A 01/04/2020   Procedure: RIGHT/LEFT HEART CATH AND CORONARY ANGIOGRAPHY;  Surgeon: Verlin Lonni BIRCH, MD;  Location: [facility] INVASIVE CV LAB;  Service: Cardiovascular;  Laterality: N/A;   ROBOTIC ASSISTED BILATERAL SALPINGO OOPHERECTOMY Bilateral 10/01/2021   Procedure: XI ROBOTIC ASSISTED BILATERAL SALPINGO OOPHORECTOMY;  Surgeon: Viktoria Comer SAUNDERS, MD;  Location: [facility] ORS;  Service: Gynecology;  Laterality: Bilateral;   SHOULDER ARTHROSCOPY Left 2016   spider vein treatment     TEE WITHOUT CARDIOVERSION N/A 06/30/2018   Procedure: TRANSESOPHAGEAL ECHOCARDIOGRAM (TEE);  Surgeon: Shlomo Wilbert SAUNDERS, MD;  Location: [facility] ENDOSCOPY;  Service: Cardiovascular;  Laterality: N/A;   TEE WITHOUT CARDIOVERSION N/A 12/12/2019   Procedure: TRANSESOPHAGEAL ECHOCARDIOGRAM (TEE);  Surgeon: Maranda Leim DEL, MD;  Location: [facility] ENDOSCOPY;  Service:  Cardiovascular;  Laterality: N/A;   TEE WITHOUT CARDIOVERSION N/A 02/10/2020   Procedure: TRANSESOPHAGEAL ECHOCARDIOGRAM (TEE);  Surgeon: Dusty Sudie DEL, MD;  Location: [facility] OR;  Service: Open Heart Surgery;  Laterality: N/A;   TONSILLECTOMY     as a child   TRANSESOPHAGEAL ECHOCARDIOGRAM (CATH LAB) N/A 01/28/2024   Procedure: TRANSESOPHAGEAL ECHOCARDIOGRAM;  Surgeon: Kennyth Chew, MD;  Location: [facility] INVASIVE CV LAB;  Service: Cardiovascular;  Laterality: N/A;   uterine fibroids removed  2012   Patient Active Problem List   Diagnosis Date Noted   Elevated serum  creatinine 11/21/2023   Retinal artery occlusion, branch, left 11/21/2023   Palpitations 08/05/2022   PVC (premature ventricular contraction) 06/05/2022   Adnexal mass    Cyst of right ovary 08/21/2021   Weight loss 08/21/2021   Nausea without vomiting 08/21/2021   Encounter for removal of sutures 02/23/2020   Encounter for therapeutic drug monitoring 02/20/2020   Atrial fibrillation (HCC) 02/20/2020   S/P minimally-invasive mitral valve repair 02/10/2020   Nonrheumatic mitral valve regurgitation    Varicose veins of right lower extremity with complications 03/25/2016   Paresthesias 07/29/2014   Tremor 12/08/2013   Personal history of breast cancer 09/06/2013   Dense breast 09/06/2013   Malignant neoplasm of lower-outer quadrant of right breast of female, estrogen receptor positive (HCC) 02/11/2012   Severe mitral valve regurgitation 05/08/2009   PREMATURE ATRIAL CONTRACTIONS 05/08/2009    PCP: Loreli Elsie JONETTA Mickey., MD  REFERRING PROVIDER: Vernetta Lonni GRADE*  REFERRING DIAG: D57.745J (ICD-10-CM) - Closed nondisplaced fracture of greater tuberosity of right humerus, initial encounter S82.045A (ICD-10-CM) - Closed nondisplaced comminuted fracture of left patella, initial encounter  Rationale for Evaluation and Treatment: Rehabilitation  THERAPY DIAG:  Acute pain of right shoulder  Stiffness of right shoulder, not elsewhere classified  Unsteadiness on feet  Acute pain of left knee  ONSET DATE: 04/30/24   SUBJECTIVE:                                                                                                                                                                                           SUBJECTIVE STATEMENT: Sharee reports progress with her knee and shoulder since her last physical therapy visit.  Her major knee concern is strength where she has strength and active range of motion concerns with her shoulder.  Pt is a 78 year old female who sustained a  mechanical fall on 04/30/24 in which she sustained a nondisplaced right shoulder greater tuberosity fracture and a nondisplaced left knee inferior pole the patella fracture.  She thinks her foot stuck to the floor causing the fall.  She c/o difficulty reaching  overhead, and some knee aching with difficulty going up the stairs.   PERTINENT HISTORY:  cancer, essential tremor, neuropathy, PAC, nonrheumatic mitral valve regurgitation s/p mitral valve repair (2021), radiculopathy (cervical)  PAIN:  Are you having pain? Yes: NPRS scale: 2-6/10 Rt shoulder; 0-3/10 Lt knee this week Pain location: Rt Shoulder & Lt Knee Pain description: sharp, like something is stuck Aggravating factors: overhead activities, change of position with the knee Relieving factors: resting, Tylenol   PRECAUTIONS:  Fall  RED FLAGS: None   WEIGHT BEARING RESTRICTIONS:  No  FALLS:  Has patient fallen in last 6 months? Yes. Number of falls 1 with injury  LIVING ENVIRONMENT: Lives with: lives with their spouse Lives in: House/apartment (condo) Stairs: doesn't use them  OCCUPATION:  Retired  PLOF:  Independent and Leisure: go to house in [employer], walking 2-3 miles 5 days a week; no strength training  PATIENT GOALS:  Improve use of Rt arm, improve pain   OBJECTIVE:  Note: Objective measures were completed at Evaluation unless otherwise noted.  DIAGNOSTIC FINDINGS:  X-rays of the right shoulder show that the greater tuberosity fracture remains nondisplaced and looks like it is almost completely healed.   X-rays of her left knee shows the fracture looks like it is healed or close to healing with no displacement at all.  PATIENT SURVEYS:  Patient-Specific Activity Scoring Scheme  0 represents "unable to perform." 10 represents "able to perform at prior level. 0 1 2 3 4 5 6 7 8 9  10 (Date and Score)   Activity Eval   07/28/2024  1. Lifting Rt arm overhead  4  5  2. Walking/Stairs  7  8  3.  Balance 5 7  Score 5.33 6.67   Total score = sum of the activity scores/number of activities Minimum detectable change (90%CI) for average score = 2 points Minimum detectable change (90%CI) for single activity score = 3 points    COGNITIVE STATUS: Within functional limits for tasks assessed   POSTURE:  rounded shoulders and forward head  HAND DOMINANCE:  Right  GAIT: 07/05/24 Comments: Amb without AD independently; no significant balance deficits noted today but will formally assess next session.    UPPER EXTREMITY ROM:  ROM (A/P) Right eval Left eval Left/Right 07/21/2024 Left/Right 07/28/2024  Shoulder flexion 110 (pain) 138 150/130 /140  Shoulder extension      Shoulder abduction 90/110 (pain with A) 145    Shoulder internal rotation T11/12 T7/8 70/45 /60  Shoulder external rotation   90/90 /90  Horizontal adduction   50/30 /45  Knee flexion   142/146   Knee extension   0/0    (Blank rows = not tested)   UPPER EXTREMITY MMT:  MMT Right eval Left/Right 07/21/2024 in pounds  Shoulder flexion 3/5   Shoulder abduction 3/5   Shoulder internal rotation  17.3/18.0  Shoulder external rotation 3/5 9.8/9.4   (Blank rows = not tested)     LOWER EXTREMITY MMT:    MMT Right eval Left eval Left/Right 07/21/2024 in pounds  Hip flexion 5/5 5/5   Hip extension     Hip abduction     Hip adduction     Hip internal rotation     Hip external rotation     Knee flexion 5/5 5/5   Knee extension 5/5 4/5 32.4/53.3  Ankle dorsiflexion 4/5 4/5   Ankle plantarflexion     Ankle inversion     Ankle eversion      (Blank rows =  not tested)    SPECIAL TESTS:  07/05/24 Upper Extremity Rotator cuff assessment: Empty can test: positive  and Full can test: positive  (on right)  FUNCTIONAL TESTS:  07/05/24 deferred today   TREATMENT:                                                                                                                              DATE:   07/28/2024 Seated straight leg raises 3 sets of 5 for 3 seconds (pillows for support) Prone quadriceps stretch 5 x 20 seconds Shoulder external rotation yellow Thera-Band 2 sets of 10 for 3 seconds Shoulder internal rotation yellow Thera-Band 10 x 3 seconds  Functional Activities: Supine shoulder flexion (for reaching and overhead function, palms in, protract 1st) 10 x 10 seconds Thumb up the back/shoulder IR (for reaching back) 10 x 10 seconds Posterior capsule stretch (for reaching) 5 x 10 seconds  02464: Reviewed objective findings and made updates and changes to her home exercise program   07/21/2024 Seated straight leg raises 3 sets of 5 for 3 seconds (pillows for support) Prone quadriceps stretch 2 x 20 seconds  Functional Activities: Supine shoulder flexion with dowel (for reaching and overhead function, palms in, protract 1st) 10 x 5 seconds Thumb up the back/shoulder IR (for reaching back) 10 x 10 seconds Posterior capsule stretch (for reaching) 5 x 10 seconds  02464: Verbally reviewed day 1 HEP, changes made today, examination findings and HEP changes based on progress and re-exam   07/05/24 TherEx See HEP - demonstrated with trial reps performed PRN, mod cues for comprehension    Self Care Educated on POC and clinical findings    PATIENT EDUCATION:  Education details: HEP Person educated: Patient Education method: Programmer, Multimedia, Facilities Manager, and Handouts Education comprehension: verbalized understanding, returned demonstration, and needs further education  HOME EXERCISE PROGRAM: Access Code: [email or web address] URL: [email or web address] Date: 07/28/2024 Prepared by: Lamar Ivory  Exercises - Heel Toe Raises with Counter Support  - 1 x daily - 1 x weekly - 2 sets - 10 reps - 2-3 sec hold - Seated Straight Leg Raise   - 1-2 x daily - 7 x weekly - 5 sets - 10 reps - 2-3 sec hold - Supine Quadriceps Stretch with Strap on Table  - 1 x daily - 1 x weekly  - 1 sets - 3 reps - 30 sec hold - Standing Shoulder Abduction ROM with Dowel (Mirrored)  - 1-2 x daily - 1 x weekly - 2 sets - 10 reps - 1-2 sec hold - Standing Shoulder Flexion AAROM with Dowel  - 2 x daily - 7 x weekly - 1 sets - 10 reps - 5 sec hold - Seated Straight Leg Raise  - 1 x daily - 7 x weekly - 5 sets - 10 reps - 3 seconds hold - Standing Shoulder Internal Rotation Stretch with Hands Behind Back  - 2 x daily - 7 x weekly - 1 sets -  10 reps - 10 seconds hold - Standing Shoulder Posterior Capsule Stretch  - 1 x daily - 1 x weekly - 1 sets - 5-10 reps - 10 hold - Shoulder External Rotation with Anchored Resistance  - 1 x daily - 7 x weekly - 2 sets - 10 reps - 3 hold - Shoulder Internal Rotation with Resistance  - 1 x daily - 7 x weekly - 1 sets - 10 reps - 3 hold  ASSESSMENT:  CLINICAL IMPRESSION: Niyah reports continued progress since starting supervised physical therapy and her last scheduled visit.  She continues to show progress with her shoulder shoulder and knee active range of motion as assessed objectively today.  We made changes to her home exercise program based on progress thus far.  Tasha appears to be on track to meet long-term goal long-term goals established at evaluation.  Patient is a 78 y.o. female who was seen today for physical therapy evaluation and treatment for Rt humerus fx and Lt patellar fx. She demonstrates decreased ROM and strength, as well as c/o balance deficits with recent fall affecting functional mobility.  She will benefit from PT to address deficits listed.     OBJECTIVE IMPAIRMENTS: decreased balance, decreased mobility, decreased ROM, decreased strength, impaired UE functional use, postural dysfunction, and pain.   ACTIVITY LIMITATIONS: carrying, lifting, stairs, bathing, toileting, reach over head, hygiene/grooming, and locomotion level  PARTICIPATION LIMITATIONS: meal prep, cleaning, laundry, shopping, and community activity  PERSONAL  FACTORS: Age, Time since onset of injury/illness/exacerbation, and 3+ comorbidities: cancer, essential tremor, neuropathy, PAC, nonrheumatic mitral valve regurgitation s/p mitral valve repair (2021), radiculopathy (cervical) are also affecting patient's functional outcome.   REHAB POTENTIAL: Good  CLINICAL DECISION MAKING: Evolving/moderate complexity  EVALUATION COMPLEXITY: Moderate   GOALS: Goals reviewed with patient? Yes  SHORT TERM GOALS: Target date: 07/26/2024  Independent with initial HEP Goal status: Met 07/28/2024  2.  Perform Rt shoulder AROM without pain and improved 10 deg abduction and flexion Goal status: Met 07/28/2024  3. Formal balance assessment with LTGs to be written as appropriate Goal status: Ongoing 07/28/2024  LONG TERM GOALS: Target date: 08/16/2024  Independent with final HEP Goal status: Ongoing 07/28/2024  2.  PSFS score improved by 2 points Goal status: INITIAL  3.  Rt shoulder strength improved to 4/5 for improved function and mobility Goal status: INIITAL  4.  Report pain < 3/10 with overhead reaching for improved function Goal status: INITIAL  5.  Demonstrate ability to negotiate stairs without increase in pain. Goal status: INITIAL    PLAN:  PT FREQUENCY: 1x/week  PT DURATION: 6 weeks  PLANNED INTERVENTIONS: 97164- PT Re-evaluation, 97750- Physical Performance Testing, 97110-Therapeutic exercises, 97530- Therapeutic activity, [id number]- Neuromuscular re-education, 97535- Self Care, 02859- Manual therapy, [id number]- Gait training, [phone]- Aquatic Therapy, [phone]- Electrical stimulation (unattended), [phone] (1-2 muscles), 20561 (3+ muscles)- Dry Needling, Patient/Family education, Balance training, Stair training, Taping, Cryotherapy, and Moist heat.  PLAN FOR NEXT SESSION: Review her current HEP with appropriate quadriceps, scapular and rotator cuff strength progressions as needed.  Mileidy needs a BERG or DGI, 5x STS.   NEXT MD VISIT:  07/28/24  Myer LELON Ivory, PT, MPT 07/28/24 5:05 PM

## 2024-07-28 NOTE — Progress Notes (Signed)
 The patient is now over 3 months status post mechanical fall in which she sustained a right shoulder greater tuberosity fracture that was nondisplaced and an inferior pole of the patella on the left knee fracture.  She slipped on a wet floor in a grocery store.  She is now in physical therapy.  She has had 3 sessions and has 3 more to go.  She says she is doing well overall.  She is right-hand dominant.  She is an active 78 year old female and said she would like the shoulder to be a little further along but overall she is doing well.  On exam her right shoulder has almost full motion.  There is some slight stiffness to it.  Her rotator cuff integrity seems pretty good actually.  The left knee also has full range of motion with good strength and just a little bit of pain over the inferior pole patella.  There is a little bit of pain in some slight grinding in the shoulder itself but is only slight.  We did not x-ray anything today given her last x-rays look good.  From our standpoint she can finish out her therapy and will follow-up as needed.  However if after a month or so from now the shoulder still bothering her on the left side she would certainly come back and see us  and we will get new x-ray of the shoulder and consider a steroid injection if needed.  All questions and concerns were addressed and answered.

## 2024-07-29 ENCOUNTER — Ambulatory Visit: Admitting: Physician Assistant

## 2024-08-01 ENCOUNTER — Encounter: Payer: Self-pay | Admitting: Radiology

## 2024-08-01 NOTE — Progress Notes (Unsigned)
 Cardiology Office Note:  .   Date:  08/01/2024  ID:  Kellie Humphrey, DOB 10/25/1945, MRN [id number] PCP: Loreli Elsie JONETTA Mickey., MD  [facility] Providers Cardiologist:  Lurena MARLA Red, MD Electrophysiologist:  Danelle Birmingham, MD {  History of Present Illness: .   Kellie Humphrey is a 78 y.o. female w/PMHx of  hypothyroidism VHD (s/p MV repair 2021) SVT, PACs, PVCs Retinal artery occl (presumptive cardioembolic) AFib  She saw Dr. Birmingham 12/15/23, recent transient loss of vision found with retinal artery occl, certain this was cardio embolic > started on OAC though felt to be a poor longterm a/c candidate 2/2 hx of fall resulting on intracranial hemorrhage,  and medication interactions and referred to Dr. Kennyth to consider watchman implant  She saw Dr. Kennyth 12/18/23, felt a candidate and planned to pursue implant  LAAO on 01/28/24, discharged same day Post Implant Anticoagulation Strategy: Continue Eliquis  5mg  twice daily for 45 days, then transition to dual anti-platelet therapy with aspirin  81mg  and clopidogrel  75mg  once daily for 6 months.    Today's visit is scheduled as her post watchaman visit ROS:   She is doing well, looks forward to getting off her Eliquis  No procedural concerns, site healed well No CP Reports daily episodes of AFib ~ , less then an hour, this is an improvement No near syncope or syncope   Arrhythmia/AAD hx Flecainide  started 2019 LAAO on 01/28/24  Studies Reviewed: SABRA    EKG done today and reviewed by myself:  ***  SB 58bpm, PR , QRS , QTc   01/28/24: Watchman Transeptal Puncture Intra-procedural TEE which showed no LAA thrombus Left atrial appendage occlusive device placement on 01/28/24 by Dr. Kennyth.  CONCLUSIONS:  1.Successful implantation of a WATCHMAN left atrial appendage occlusive device    2. TEE demonstrating no LAA thrombus 3. No early apparent complications.   Echo 11/22/23:  1. Left ventricular  ejection fraction, by estimation, is 55 to 60%. The  left ventricle has normal function. The left ventricle has no regional  wall motion abnormalities. Left ventricular diastolic function could not  be evaluated.   2. Right ventricular systolic function is normal. The right ventricular  size is normal. There is normal pulmonary artery systolic pressure. The  estimated right ventricular systolic pressure is 31.5 mmHg.   3. The mitral valve has been repaired/replaced. No evidence of mitral  valve regurgitation. The mean mitral valve gradient is 6.0 mmHg with  average heart rate of 82 bpm. There is a 32 mm [name] prosthetic  annuloplasty ring present in the mitral position. Procedure Date: 02/10/20.   4. The aortic valve is tricuspid. There is mild calcification of the  aortic valve. Aortic valve regurgitation is not visualized. No aortic  stenosis is present.    Risk Assessment/Calculations:    Physical Exam:   VS:  There were no vitals taken for this visit.   Wt Readings from Last 3 Encounters:  06/13/24 130 lb (59 kg)  04/30/24 130 lb (59 kg)  03/16/24 128 lb (58.1 kg)    GEN: Well nourished, well developed in no acute distress NECK: No JVD; No carotid bruits CARDIAC: *** RRR, no murmurs, rubs, gallops RESPIRATORY: *** CTA b/l without rales, wheezing or rhonchi  ABDOMEN: Soft, non-tender, non-distended EXTREMITIES: *** No edema; No deformity   ASSESSMENT AND PLAN: .    paroxysmal AFib Atrial arrhythmias CHA2DS2Vasc is 5 Chronic flecainide /metoprolol , w/stable intervals S/p Watchman ***   Stop Eliquis  Start ASA  81mg  daily, plavix  75mg  daily Antibiotic prophylaxis (lifelong given her valve), she is very familiar given her MV repair (needs refills though)   VHD S/p MV repair Stable by her echo 2025 C/w Dr. Audria  Secondary hypercoagulable state 2/2 AFib    Dispo: as above, sooner if needed  Signed, Charlies Macario Arthur, PA-C

## 2024-08-03 ENCOUNTER — Ambulatory Visit: Attending: Physician Assistant | Admitting: Physician Assistant

## 2024-08-03 VITALS — BP 107/74 | Ht 67.0 in | Wt 131.0 lb

## 2024-08-03 DIAGNOSIS — I493 Ventricular premature depolarization: Secondary | ICD-10-CM

## 2024-08-03 DIAGNOSIS — Z79899 Other long term (current) drug therapy: Secondary | ICD-10-CM | POA: Diagnosis not present

## 2024-08-03 DIAGNOSIS — I480 Paroxysmal atrial fibrillation: Secondary | ICD-10-CM | POA: Diagnosis not present

## 2024-08-03 DIAGNOSIS — Z5181 Encounter for therapeutic drug level monitoring: Secondary | ICD-10-CM | POA: Diagnosis not present

## 2024-08-03 DIAGNOSIS — Z9889 Other specified postprocedural states: Secondary | ICD-10-CM | POA: Diagnosis not present

## 2024-08-03 DIAGNOSIS — D6869 Other thrombophilia: Secondary | ICD-10-CM

## 2024-08-03 MED ORDER — METOPROLOL TARTRATE 25 MG PO TABS: 25.0000 mg | ORAL_TABLET | Freq: Three times a day (TID) | ORAL | 3 refills | Status: DC

## 2024-08-03 MED ORDER — ASPIRIN 81 MG PO TBEC: 81.0000 mg | DELAYED_RELEASE_TABLET | Freq: Every day | ORAL | Status: AC

## 2024-08-03 MED ORDER — FLECAINIDE ACETATE 100 MG PO TABS: 100.0000 mg | ORAL_TABLET | Freq: Two times a day (BID) | ORAL | 3 refills | Status: AC

## 2024-08-03 NOTE — Patient Instructions (Signed)
 Medication Instructions:   START TAKING:  FLECAINIDE  100 MG TWICE  A  DAY    START TAKING :  ASPIRIN    81 MG ONCE A DAY     START TAKING :  METOPROLOL   25 MG THREE TIMES A DAY    STOP TAKING AND REMOVE THIS MEDICATION FROM YOUR MEDICATION LIST: PLAVIX     *If you need a refill on your cardiac medications before your next appointment, please call your pharmacy*   Lab Work: NONE ORDERED  TODAY    If you have labs (blood work) drawn today and your tests are completely normal, you will receive your results only by: MyChart Message (if you have MyChart) OR A paper copy in the mail If you have any lab test that is abnormal or we need to change your treatment, we will call you to review the results.   Testing/Procedures: NONE ORDERED  TODAY     Follow-Up: At [facility], you and your health needs are our priority.  As part of our continuing mission to provide you with exceptional heart care, our providers are all part of one team.  This team includes your primary Cardiologist (physician) and Advanced Practice Providers or APPs (Physician Assistants and Nurse Practitioners) who all work together to provide you with the care you need, when you need it.  Your next appointment:  3-4  week(s)   Provider:  Charlies Arthur, PA-C ( CONTACT  CASSIE HALL/ ANGELINE HAMMER FOR EP SCHEDULING ISSUES )     We recommend signing up for the patient portal called MyChart.  Sign up information is provided on this After Visit Summary.  MyChart is used to connect with patients for Virtual Visits (Telemedicine).  Patients are able to view lab/test results, encounter notes, upcoming appointments, etc.  Non-urgent messages can be sent to your provider as well.   To learn more about what you can do with MyChart, go to [email or web address].   Other Instructions

## 2024-08-04 ENCOUNTER — Ambulatory Visit: Admitting: Rehabilitative and Restorative Service Providers"

## 2024-08-04 ENCOUNTER — Encounter: Payer: Self-pay | Admitting: Rehabilitative and Restorative Service Providers"

## 2024-08-04 DIAGNOSIS — M25611 Stiffness of right shoulder, not elsewhere classified: Secondary | ICD-10-CM

## 2024-08-04 DIAGNOSIS — M25511 Pain in right shoulder: Secondary | ICD-10-CM | POA: Diagnosis not present

## 2024-08-04 DIAGNOSIS — R2681 Unsteadiness on feet: Secondary | ICD-10-CM

## 2024-08-04 DIAGNOSIS — M25562 Pain in left knee: Secondary | ICD-10-CM

## 2024-08-04 NOTE — Therapy (Signed)
 OUTPATIENT PHYSICAL THERAPY TREATMENT/PROGRESS NOTE  Progress Note Reporting Period 07/05/2024 to 08/04/2024  See note below for Objective Data and Assessment of Progress/Goals.      Patient Name: Evy Lutterman MRN: [id number] DOB:1945-10-06, 78 y.o., female Today's Date: 08/04/2024  END OF SESSION:  PT End of Session - 08/04/24 1018     Visit Number 4    Number of Visits 6    Date for Recertification  08/16/24    Authorization Type [employer]    Progress Note Due on Visit 10    PT Start Time 1016    PT Stop Time 1100    PT Time Calculation (min) 44 min    Activity Tolerance Patient tolerated treatment well;No increased pain    Behavior During Therapy [facility] for tasks assessed/performed             Past Medical History:  Diagnosis Date   Anxiety disorder    Arthritis    Breast cancer (HCC) 07/25/2008   Cancer (HCC)    Cataract    Essential tremor    /familial   Heart murmur 2021   corrected with valve surgery   Hypothyroidism    Interstitial cystitis    Neuropathy    Nonrheumatic mitral valve regurgitation    PAC (premature atrial contraction)    Paresthesias 07/29/2014   Personal history of radiation therapy    PREMATURE ATRIAL CONTRACTIONS 05/08/2009   Qualifier: Diagnosis of  By: Buell, RN, [name]     Radiculopathy, cervical region    RLS (restless legs syndrome)    S/P minimally-invasive mitral valve repair 02/10/2020   Complex valvuloplasty including artificial Gore-tex neochord placement x8 with edge-to-edge suture plication of anterior commissure and 32 mm [name] 4D ring annuloplasty via right mini thoracotomy approach   Thyroid  disease    Varicose veins of right lower extremity with complications 03/25/2016   Past Surgical History:  Procedure Laterality Date   bilateral cataract repair  july/aug 2021   Dr Milan   BREAST BIOPSY  2009   BREAST LUMPECTOMY  07/25/2008   CARDIAC CATHETERIZATION  2021   DILATION AND CURETTAGE OF  UTERUS     age 52   EYE SURGERY Left    Pterygium   HEMANGIOMA EXCISION Left 1990   arm   LEFT ATRIAL APPENDAGE OCCLUSION N/A 01/28/2024   Procedure: LEFT ATRIAL APPENDAGE OCCLUSION;  Surgeon: Kennyth Chew, MD;  Location: [facility] INVASIVE CV LAB;  Service: Cardiovascular;  Laterality: N/A;   MELANOMA EXCISION Right 1980   thigh   MITRAL VALVE REPAIR Right 02/10/2020   Procedure: MINIMALLY INVASIVE MITRAL VALVE REPAIR (MVR) using Memo 4D 32 MM Mitral Valve Ring.;  Surgeon: Dusty Sudie DEL, MD;  Location: [facility] OR;  Service: Open Heart Surgery;  Laterality: Right;   NECK SURGERY  10/2009   C5-6 fusion   RIGHT/LEFT HEART CATH AND CORONARY ANGIOGRAPHY N/A 01/04/2020   Procedure: RIGHT/LEFT HEART CATH AND CORONARY ANGIOGRAPHY;  Surgeon: Verlin Lonni BIRCH, MD;  Location: [facility] INVASIVE CV LAB;  Service: Cardiovascular;  Laterality: N/A;   ROBOTIC ASSISTED BILATERAL SALPINGO OOPHERECTOMY Bilateral 10/01/2021   Procedure: XI ROBOTIC ASSISTED BILATERAL SALPINGO OOPHORECTOMY;  Surgeon: Viktoria Comer SAUNDERS, MD;  Location: [facility] ORS;  Service: Gynecology;  Laterality: Bilateral;   SHOULDER ARTHROSCOPY Left 2016   spider vein treatment     TEE WITHOUT CARDIOVERSION N/A 06/30/2018   Procedure: TRANSESOPHAGEAL ECHOCARDIOGRAM (TEE);  Surgeon: Shlomo Wilbert SAUNDERS, MD;  Location: [facility] ENDOSCOPY;  Service: Cardiovascular;  Laterality: N/A;  TEE WITHOUT CARDIOVERSION N/A 12/12/2019   Procedure: TRANSESOPHAGEAL ECHOCARDIOGRAM (TEE);  Surgeon: Maranda Leim DEL, MD;  Location: [facility] ENDOSCOPY;  Service: Cardiovascular;  Laterality: N/A;   TEE WITHOUT CARDIOVERSION N/A 02/10/2020   Procedure: TRANSESOPHAGEAL ECHOCARDIOGRAM (TEE);  Surgeon: Dusty Sudie DEL, MD;  Location: [facility] OR;  Service: Open Heart Surgery;  Laterality: N/A;   TONSILLECTOMY     as a child   TRANSESOPHAGEAL ECHOCARDIOGRAM (CATH LAB) N/A 01/28/2024   Procedure: TRANSESOPHAGEAL ECHOCARDIOGRAM;  Surgeon: Kennyth Chew, MD;  Location: [facility] INVASIVE CV LAB;   Service: Cardiovascular;  Laterality: N/A;   uterine fibroids removed  2012   Patient Active Problem List   Diagnosis Date Noted   Elevated serum creatinine 11/21/2023   Retinal artery occlusion, branch, left 11/21/2023   Palpitations 08/05/2022   PVC (premature ventricular contraction) 06/05/2022   Adnexal mass    Cyst of right ovary 08/21/2021   Weight loss 08/21/2021   Nausea without vomiting 08/21/2021   Encounter for removal of sutures 02/23/2020   Encounter for therapeutic drug monitoring 02/20/2020   Atrial fibrillation (HCC) 02/20/2020   S/P minimally-invasive mitral valve repair 02/10/2020   Nonrheumatic mitral valve regurgitation    Varicose veins of right lower extremity with complications 03/25/2016   Paresthesias 07/29/2014   Tremor 12/08/2013   Personal history of breast cancer 09/06/2013   Dense breast 09/06/2013   Malignant neoplasm of lower-outer quadrant of right breast of female, estrogen receptor positive (HCC) 02/11/2012   Severe mitral valve regurgitation 05/08/2009   PREMATURE ATRIAL CONTRACTIONS 05/08/2009    PCP: Loreli Elsie JONETTA Mickey., MD  REFERRING PROVIDER: Vernetta Lonni GRADE*  REFERRING DIAG: D57.745J (ICD-10-CM) - Closed nondisplaced fracture of greater tuberosity of right humerus, initial encounter S82.045A (ICD-10-CM) - Closed nondisplaced comminuted fracture of left patella, initial encounter  Rationale for Evaluation and Treatment: Rehabilitation  THERAPY DIAG:  Acute pain of right shoulder - Plan: PT plan of care cert/re-cert  Stiffness of right shoulder, not elsewhere classified - Plan: PT plan of care cert/re-cert  Unsteadiness on feet - Plan: PT plan of care cert/re-cert  Acute pain of left knee - Plan: PT plan of care cert/re-cert  ONSET DATE: 04/30/24   SUBJECTIVE:                                                                                                                                                                                            SUBJECTIVE STATEMENT: Jahaira continues to note progress with her knee and shoulder since starting supervised rehabilitation.  Strength remains her primary concern in regards to her knee, while strength and active range of motion are a  concern for her shoulder.  Pt is a 78 year old female who sustained a mechanical fall on 04/30/24 in which she sustained a nondisplaced right shoulder greater tuberosity fracture and a nondisplaced left knee inferior pole the patella fracture.  She thinks her foot stuck to the floor causing the fall.  She c/o difficulty reaching overhead, and some knee aching with difficulty going up the stairs.   PERTINENT HISTORY:  cancer, essential tremor, neuropathy, PAC, nonrheumatic mitral valve regurgitation s/p mitral valve repair (2021), radiculopathy (cervical)  PAIN:  Are you having pain? Yes: NPRS scale: 2-5/10 Rt shoulder; 0-3/10 Lt knee this week Pain location: Rt Shoulder & Lt Knee Pain description: Can still be sharp, like something is stuck Aggravating factors: overhead activities, change of position with the knee Relieving factors: resting, Tylenol   PRECAUTIONS:  Fall  RED FLAGS: None   WEIGHT BEARING RESTRICTIONS:  No  FALLS:  Has patient fallen in last 6 months? Yes. Number of falls 1 with injury  LIVING ENVIRONMENT: Lives with: lives with their spouse Lives in: House/apartment (condo) Stairs: doesn't use them  OCCUPATION:  Retired  PLOF:  Independent and Leisure: go to house in [place] [employer], walking 2-3 miles 5 days a week; no strength training  PATIENT GOALS:  Improve use of Rt arm, improve pain   OBJECTIVE:  Note: Objective measures were completed at Evaluation unless otherwise noted.  DIAGNOSTIC FINDINGS:  X-rays of the right shoulder show that the greater tuberosity fracture remains nondisplaced and looks like it is almost completely healed.   X-rays of her left knee shows the fracture looks like it is healed or  close to healing with no displacement at all.  PATIENT SURVEYS:  Patient-Specific Activity Scoring Scheme  0 represents "unable to perform." 10 represents "able to perform at prior level. 0 1 2 3 4 5 6 7 8 9  10 (Date and Score)   Activity Eval   07/28/2024  1. Lifting Rt arm overhead  4  5  2. Walking/Stairs  7  8  3. Balance 5 7  Score 5.33 6.67   Total score = sum of the activity scores/number of activities Minimum detectable change (90%CI) for average score = 2 points Minimum detectable change (90%CI) for single activity score = 3 points    COGNITIVE STATUS: Within functional limits for tasks assessed   POSTURE:  rounded shoulders and forward head  HAND DOMINANCE:  Right  GAIT: 07/05/24 Comments: Amb without AD independently; no significant balance deficits noted today but will formally assess next session.    UPPER EXTREMITY ROM:  ROM (A/P) Right eval Left eval Left/Right 07/21/2024 Left/Right 07/28/2024 Right 08/04/2024  Shoulder flexion 110 (pain) 138 150/130 /140 145  Shoulder extension       Shoulder abduction 90/110 (pain with A) 145     Shoulder internal rotation T11/12 T7/8 70/45 /60 50  Shoulder external rotation   90/90 /90 100  Horizontal adduction   50/30 /45 50  Knee flexion   142/146    Knee extension   0/0     (Blank rows = not tested)   UPPER EXTREMITY MMT:  MMT Right eval Left/Right 07/21/2024 in pounds Right 08/04/2024  Shoulder flexion 3/5    Shoulder abduction 3/5    Shoulder internal rotation  17.3/18.0 24.8  Shoulder external rotation 3/5 9.8/9.4 10.3   (Blank rows = not tested)     LOWER EXTREMITY MMT:    MMT Right eval Left eval Left/Right 07/21/2024 in pounds Right 08/04/2024  Hip  flexion 5/5 5/5    Hip extension      Hip abduction      Hip adduction      Hip internal rotation      Hip external rotation      Knee flexion 5/5 5/5    Knee extension 5/5 4/5 32.4/53.3 37.9  Ankle dorsiflexion 4/5 4/5    Ankle  plantarflexion      Ankle inversion      Ankle eversion       (Blank rows = not tested)    SPECIAL TESTS:  07/05/24 Upper Extremity Rotator cuff assessment: Empty can test: positive  and Full can test: positive  (on right)  FUNCTIONAL TESTS:  07/05/24 deferred today   TREATMENT:                                                                                                                              DATE:  08/04/2024 Seated straight leg raises 2 sets of 10 for 3 seconds (pillows for support), 1# on the 2nd set Prone quadriceps stretch 4 x 20 seconds Shoulder external rotation yellow Thera-Band 2 sets of 10 for 3 seconds Shoulder internal rotation yellow Thera-Band 10 x 3 seconds  Functional Activities: Supine shoulder flexion (for reaching and overhead function, palms in, protract 1st) 10 x 10 seconds Thumb up the back/shoulder IR (for reaching back) 10 x 10 seconds Supine arm raises/scapular protraction (reaching and overhead function) 10 x with 3# and 10 X with 4#  [id number]: Reviewed today's objective findings and made updates and changes to her home exercise program   07/28/2024 Seated straight leg raises 3 sets of 5 for 3 seconds (pillows for support) Prone quadriceps stretch 5 x 20 seconds Shoulder external rotation yellow Thera-Band 2 sets of 10 for 3 seconds Shoulder internal rotation yellow Thera-Band 10 x 3 seconds  Functional Activities: Supine shoulder flexion (for reaching and overhead function, palms in, protract 1st) 10 x 10 seconds Thumb up the back/shoulder IR (for reaching back) 10 x 10 seconds Posterior capsule stretch (for reaching) 5 x 10 seconds  02464: Reviewed objective findings and made updates and changes to her home exercise program   07/21/2024 Seated straight leg raises 3 sets of 5 for 3 seconds (pillows for support) Prone quadriceps stretch 2 x 20 seconds  Functional Activities: Supine shoulder flexion with dowel (for reaching and overhead  function, palms in, protract 1st) 10 x 5 seconds Thumb up the back/shoulder IR (for reaching back) 10 x 10 seconds Posterior capsule stretch (for reaching) 5 x 10 seconds  02464: Verbally reviewed day 1 HEP, changes made today, examination findings and HEP changes based on progress and re-exam  PATIENT EDUCATION:  Education details: HEP Person educated: Patient Education method: Programmer, Multimedia, Demonstration, and Handouts Education comprehension: verbalized understanding, returned demonstration, and needs further education  HOME EXERCISE PROGRAM: Access Code: [email or web address] URL: [email or web address] Date: 08/04/2024 Prepared by: Lamar Ivory  Exercises - Heel Toe Raises  with Counter Support  - 1 x daily - 1 x weekly - 2 sets - 10 reps - 2-3 sec hold - Seated Straight Leg Raise   - 1-2 x daily - 7 x weekly - 5 sets - 10 reps - 2-3 sec hold - Supine Quadriceps Stretch with Strap on Table  - 1 x daily - 1 x weekly - 1 sets - 3 reps - 30 sec hold - Standing Shoulder Abduction ROM with Dowel (Mirrored)  - 1-2 x daily - 1 x weekly - 2 sets - 10 reps - 1-2 sec hold - Standing Shoulder Flexion AAROM with Dowel  - 2 x daily - 7 x weekly - 1 sets - 10 reps - 5 sec hold - Small Range Straight Leg Raise  - 1 x daily - 7 x weekly - 5 sets - 10 reps - 3 seconds hold - Standing Shoulder Internal Rotation Stretch with Hands Behind Back  - 2 x daily - 7 x weekly - 1 sets - 10 reps - 10 seconds hold - Standing Shoulder Posterior Capsule Stretch  - 1 x daily - 1 x weekly - 1 sets - 5-10 reps - 10 hold - Shoulder External Rotation with Anchored Resistance  - 1 x daily - 7 x weekly - 2 sets - 10 reps - 3 hold - Shoulder Internal Rotation with Resistance  - 1 x daily - 7 x weekly - 1 sets - 10 reps - 3 hold - Supine Scapular Protraction in Flexion with Dumbbells  - 1-2 x daily - 7 x weekly - 2 sets - 20 reps - 3 seconds hold  ASSESSMENT:  CLINICAL IMPRESSION: Jordane continues to make objective  and functional progress with her supervised physical therapy.  Her shoulder active range of motion is excellent for a proximal humeral fracture, although we are still working on limitations in flexion and internal rotation.  Quadriceps and rotator cuff strength have improved, not only since starting PT but since her last appointment.  Anaysia remains on track to meet long-term goals established at evaluation and will benefit from a bit more supervised work to help facilitate improved function, meeting all long-term goals and successful transition into long-term management independently.    Patient is a 78 y.o. female who was seen today for physical therapy evaluation and treatment for Rt humerus fx and Lt patellar fx. She demonstrates decreased ROM and strength, as well as c/o balance deficits with recent fall affecting functional mobility.  She will benefit from PT to address deficits listed.     OBJECTIVE IMPAIRMENTS: decreased balance, decreased mobility, decreased ROM, decreased strength, impaired UE functional use, postural dysfunction, and pain.   ACTIVITY LIMITATIONS: carrying, lifting, stairs, bathing, toileting, reach over head, hygiene/grooming, and locomotion level  PARTICIPATION LIMITATIONS: meal prep, cleaning, laundry, shopping, and community activity  PERSONAL FACTORS: Age, Time since onset of injury/illness/exacerbation, and 3+ comorbidities: cancer, essential tremor, neuropathy, PAC, nonrheumatic mitral valve regurgitation s/p mitral valve repair (2021), radiculopathy (cervical) are also affecting patient's functional outcome.   REHAB POTENTIAL: Good  CLINICAL DECISION MAKING: Evolving/moderate complexity  EVALUATION COMPLEXITY: Moderate   GOALS: Goals reviewed with patient? Yes  SHORT TERM GOALS: Target date: 07/26/2024  Independent with initial HEP Goal status: Met 07/28/2024  2.  Perform Rt shoulder AROM without pain and improved 10 deg abduction and flexion Goal  status: Met 07/28/2024  3. Formal balance assessment with LTGs to be written as appropriate Goal status: Ongoing 07/28/2024  LONG TERM GOALS: Target  date: 09/01/2024  Independent with final HEP Goal status: Ongoing 08/04/2024  2.  PSFS score improved by 2 points Goal status: Ongoing 08/04/2024  3.  Rt shoulder strength improved to 4/5 for improved function and mobility Goal status: Partially met 08/04/2024  4.  Report pain < 3/10 with overhead reaching for improved function Goal status: Partially met 08/04/2024  5.  Demonstrate ability to negotiate stairs without increase in pain. Goal status: Ongoing 08/04/2024    PLAN:  PT FREQUENCY: 1x/week  PT DURATION: 4 additional weeks  PLANNED INTERVENTIONS: 97164- PT Re-evaluation, 97750- Physical Performance Testing, 97110-Therapeutic exercises, 97530- Therapeutic activity, 97112- Neuromuscular re-education, 97535- Self Care, 02859- Manual therapy, [phone]- Gait training, [phone]- Aquatic Therapy, [phone]- Electrical stimulation (unattended), [phone] (1-2 muscles), [phone] (3+ muscles)- Dry Needling, Patient/Family education, Balance training, Stair training, Taping, Cryotherapy, and Moist heat.  PLAN FOR NEXT SESSION: Continue work on shoulder flexion and internal rotation active range of motion/flexibility.  Continue work on scapular, rotator cuff and quadriceps strength.  Check on long-term goals and Constantina needs a BERG or DGI, 5x STS.   Myer LELON Ivory, PT, MPT 08/04/24 6:12 PM

## 2024-08-08 ENCOUNTER — Ambulatory Visit: Admitting: Physical Therapy

## 2024-08-08 ENCOUNTER — Encounter: Payer: Self-pay | Admitting: Physical Therapy

## 2024-08-08 DIAGNOSIS — R2681 Unsteadiness on feet: Secondary | ICD-10-CM

## 2024-08-08 DIAGNOSIS — M25562 Pain in left knee: Secondary | ICD-10-CM | POA: Diagnosis not present

## 2024-08-08 DIAGNOSIS — M25611 Stiffness of right shoulder, not elsewhere classified: Secondary | ICD-10-CM | POA: Diagnosis not present

## 2024-08-08 DIAGNOSIS — R293 Abnormal posture: Secondary | ICD-10-CM

## 2024-08-08 DIAGNOSIS — M25511 Pain in right shoulder: Secondary | ICD-10-CM

## 2024-08-08 NOTE — Therapy (Signed)
 OUTPATIENT PHYSICAL THERAPY TREATMENT   Patient Name: Kebra Lowrimore MRN: [id number] DOB:[birth year], 78 y.o., female Today's Date: 08/08/2024  END OF SESSION:  PT End of Session - 08/08/24 1016     Visit Number 5    Number of Visits 6    Date for Recertification  08/16/24    Authorization Type [employer]    Progress Note Due on Visit 10    PT Start Time 1015    PT Stop Time 1045    PT Time Calculation (min) 30 min    Activity Tolerance Patient tolerated treatment well;No increased pain    Behavior During Therapy [facility] for tasks assessed/performed              Past Medical History:  Diagnosis Date   Anxiety disorder    Arthritis    Breast cancer (HCC) 07/25/2008   Cancer (HCC)    Cataract    Essential tremor    /familial   Heart murmur 2021   corrected with valve surgery   Hypothyroidism    Interstitial cystitis    Neuropathy    Nonrheumatic mitral valve regurgitation    PAC (premature atrial contraction)    Paresthesias 07/29/2014   Personal history of radiation therapy    PREMATURE ATRIAL CONTRACTIONS 05/08/2009   Qualifier: Diagnosis of  By: Buell, RN, [name]     Radiculopathy, cervical region    RLS (restless legs syndrome)    S/P minimally-invasive mitral valve repair 02/10/2020   Complex valvuloplasty including artificial Gore-tex neochord placement x8 with edge-to-edge suture plication of anterior commissure and 32 mm [name] 4D ring annuloplasty via right mini thoracotomy approach   Thyroid  disease    Varicose veins of right lower extremity with complications 03/25/2016   Past Surgical History:  Procedure Laterality Date   bilateral cataract repair  july/aug 2021   Dr Milan   BREAST BIOPSY  2009   BREAST LUMPECTOMY  07/25/2008   CARDIAC CATHETERIZATION  2021   DILATION AND CURETTAGE OF UTERUS     age 61   EYE SURGERY Left    Pterygium   HEMANGIOMA EXCISION Left 1990   arm   LEFT ATRIAL APPENDAGE OCCLUSION N/A 01/28/2024    Procedure: LEFT ATRIAL APPENDAGE OCCLUSION;  Surgeon: Kennyth Chew, MD;  Location: [facility] INVASIVE CV LAB;  Service: Cardiovascular;  Laterality: N/A;   MELANOMA EXCISION Right 1980   thigh   MITRAL VALVE REPAIR Right 02/10/2020   Procedure: MINIMALLY INVASIVE MITRAL VALVE REPAIR (MVR) using Memo 4D 32 MM Mitral Valve Ring.;  Surgeon: Dusty Sudie DEL, MD;  Location: [facility] OR;  Service: Open Heart Surgery;  Laterality: Right;   NECK SURGERY  10/2009   C5-6 fusion   RIGHT/LEFT HEART CATH AND CORONARY ANGIOGRAPHY N/A 01/04/2020   Procedure: RIGHT/LEFT HEART CATH AND CORONARY ANGIOGRAPHY;  Surgeon: Verlin Lonni BIRCH, MD;  Location: [facility] INVASIVE CV LAB;  Service: Cardiovascular;  Laterality: N/A;   ROBOTIC ASSISTED BILATERAL SALPINGO OOPHERECTOMY Bilateral 10/01/2021   Procedure: XI ROBOTIC ASSISTED BILATERAL SALPINGO OOPHORECTOMY;  Surgeon: Viktoria Comer SAUNDERS, MD;  Location: [facility] ORS;  Service: Gynecology;  Laterality: Bilateral;   SHOULDER ARTHROSCOPY Left 2016   spider vein treatment     TEE WITHOUT CARDIOVERSION N/A 06/30/2018   Procedure: TRANSESOPHAGEAL ECHOCARDIOGRAM (TEE);  Surgeon: Shlomo Wilbert SAUNDERS, MD;  Location: [facility] ENDOSCOPY;  Service: Cardiovascular;  Laterality: N/A;   TEE WITHOUT CARDIOVERSION N/A 12/12/2019   Procedure: TRANSESOPHAGEAL ECHOCARDIOGRAM (TEE);  Surgeon: Maranda Leim DEL, MD;  Location: [facility] ENDOSCOPY;  Service: Cardiovascular;  Laterality: N/A;   TEE WITHOUT CARDIOVERSION N/A 02/10/2020   Procedure: TRANSESOPHAGEAL ECHOCARDIOGRAM (TEE);  Surgeon: Dusty Sudie DEL, MD;  Location: [facility] OR;  Service: Open Heart Surgery;  Laterality: N/A;   TONSILLECTOMY     as a child   TRANSESOPHAGEAL ECHOCARDIOGRAM (CATH LAB) N/A 01/28/2024   Procedure: TRANSESOPHAGEAL ECHOCARDIOGRAM;  Surgeon: Kennyth Chew, MD;  Location: [facility] INVASIVE CV LAB;  Service: Cardiovascular;  Laterality: N/A;   uterine fibroids removed  2012   Patient Active Problem List   Diagnosis Date Noted   Elevated serum  creatinine 11/21/2023   Retinal artery occlusion, branch, left 11/21/2023   Palpitations 08/05/2022   PVC (premature ventricular contraction) 06/05/2022   Adnexal mass    Cyst of right ovary 08/21/2021   Weight loss 08/21/2021   Nausea without vomiting 08/21/2021   Encounter for removal of sutures 02/23/2020   Encounter for therapeutic drug monitoring 02/20/2020   Atrial fibrillation (HCC) 02/20/2020   S/P minimally-invasive mitral valve repair 02/10/2020   Nonrheumatic mitral valve regurgitation    Varicose veins of right lower extremity with complications 03/25/2016   Paresthesias 07/29/2014   Tremor 12/08/2013   Personal history of breast cancer 09/06/2013   Dense breast 09/06/2013   Malignant neoplasm of lower-outer quadrant of right breast of female, estrogen receptor positive (HCC) 02/11/2012   Severe mitral valve regurgitation 05/08/2009   PREMATURE ATRIAL CONTRACTIONS 05/08/2009    PCP: Loreli Elsie JONETTA Mickey., MD  REFERRING PROVIDER: Vernetta Lonni GRADE*  REFERRING DIAG: D57.745J (ICD-10-CM) - Closed nondisplaced fracture of greater tuberosity of right humerus, initial encounter S82.045A (ICD-10-CM) - Closed nondisplaced comminuted fracture of left patella, initial encounter  Rationale for Evaluation and Treatment: Rehabilitation  THERAPY DIAG:  Acute pain of right shoulder  Stiffness of right shoulder, not elsewhere classified  Unsteadiness on feet  Acute pain of left knee  Abnormal posture  ONSET DATE: 04/30/24   SUBJECTIVE:                                                                                                                                                                                           SUBJECTIVE STATEMENT: Doing well, wants some balance exercises; still feels off when moving head quickly.  Pt is a 78 year old female who sustained a mechanical fall on 04/30/24 in which she sustained a nondisplaced right shoulder greater tuberosity  fracture and a nondisplaced left knee inferior pole the patella fracture.  She thinks her foot stuck to the floor causing the fall.  She c/o difficulty reaching overhead, and some knee aching with difficulty going up the stairs.   PERTINENT HISTORY:  cancer,  essential tremor, neuropathy, PAC, nonrheumatic mitral valve regurgitation s/p mitral valve repair (2021), radiculopathy (cervical)  PAIN:  Are you having pain? Yes: NPRS scale: 2-5/10 Rt shoulder; 0-3/10 Lt knee this week Pain location: Rt Shoulder & Lt Knee Pain description: Can still be sharp, like something is stuck Aggravating factors: overhead activities, change of position with the knee Relieving factors: resting, Tylenol   PRECAUTIONS:  Fall  RED FLAGS: None   WEIGHT BEARING RESTRICTIONS:  No  FALLS:  Has patient fallen in last 6 months? Yes. Number of falls 1 with injury  LIVING ENVIRONMENT: Lives with: lives with their spouse Lives in: House/apartment (condo) Stairs: doesn't use them  OCCUPATION:  Retired  PLOF:  Independent and Leisure: go to house in [place] [employer], walking 2-3 miles 5 days a week; no strength training  PATIENT GOALS:  Improve use of Rt arm, improve pain   OBJECTIVE:  Note: Objective measures were completed at Evaluation unless otherwise noted.  DIAGNOSTIC FINDINGS:  X-rays of the right shoulder show that the greater tuberosity fracture remains nondisplaced and looks like it is almost completely healed.   X-rays of her left knee shows the fracture looks like it is healed or close to healing with no displacement at all.  PATIENT SURVEYS:  Patient-Specific Activity Scoring Scheme  0 represents "unable to perform." 10 represents "able to perform at prior level. 0 1 2 3 4 5 6 7 8 9  10 (Date and Score)   Activity Eval   07/28/2024 08/08/24  1. Lifting Rt arm overhead  4  5 8   2. Walking/Stairs  7  8 9   3. Balance 5 7 6   Score 5.33 6.67 7.67   Total score = sum of the activity  scores/number of activities Minimum detectable change (90%CI) for average score = 2 points Minimum detectable change (90%CI) for single activity score = 3 points    COGNITIVE STATUS: Within functional limits for tasks assessed   POSTURE:  rounded shoulders and forward head  HAND DOMINANCE:  Right  GAIT: 07/05/24 Comments: Amb without AD independently; no significant balance deficits noted today but will formally assess next session.    UPPER EXTREMITY ROM:  ROM (A/P) Right eval Left eval Left/Right 07/21/2024 Left/Right 07/28/2024 Right 08/04/2024  Shoulder flexion 110 (pain) 138 150/130 /140 145  Shoulder extension       Shoulder abduction 90/110 (pain with A) 145     Shoulder internal rotation T11/12 T7/8 70/45 /60 50  Shoulder external rotation   90/90 /90 100  Horizontal adduction   50/30 /45 50  Knee flexion   142/146    Knee extension   0/0     (Blank rows = not tested)   UPPER EXTREMITY MMT:  MMT Right eval Left/Right 07/21/2024 in pounds Right 08/04/2024  Shoulder flexion 3/5    Shoulder abduction 3/5    Shoulder internal rotation  17.3/18.0 24.8  Shoulder external rotation 3/5 9.8/9.4 10.3   (Blank rows = not tested)     LOWER EXTREMITY MMT:    MMT Right eval Left eval Left/Right 07/21/2024 in pounds Right 08/04/2024  Hip flexion 5/5 5/5    Hip extension      Hip abduction      Hip adduction      Hip internal rotation      Hip external rotation      Knee flexion 5/5 5/5    Knee extension 5/5 4/5 32.4/53.3 37.9  Ankle dorsiflexion 4/5 4/5    Ankle plantarflexion  Ankle inversion      Ankle eversion       (Blank rows = not tested)    SPECIAL TESTS:  07/05/24 Upper Extremity Rotator cuff assessment: Empty can test: positive  and Full can test: positive  (on right)  FUNCTIONAL TESTS:  07/05/24 deferred today   TREATMENT:                                                                                                                               DATE:  08/08/24 UBE UE/LE L5 x 6 min Negotiated stairs with focus on eccentric quad control with descending; no difficulty with ascending Discussed HEP and encouraged her to continue to work on strengthening to maximize function and mobility  Neuro Re-Ed Corner balance exercises on foam: Feet apart with EC horizontal/vertical head turns x 10 each Feet together with EC x 10 sec Feet together with EO horizontal/vertical head turns x 10 each Amb in hallway 30'x2 with horizontal and vertical head turns - occasional need to reach for wall for support    08/04/2024 Seated straight leg raises 2 sets of 10 for 3 seconds (pillows for support), 1# on the 2nd set Prone quadriceps stretch 4 x 20 seconds Shoulder external rotation yellow Thera-Band 2 sets of 10 for 3 seconds Shoulder internal rotation yellow Thera-Band 10 x 3 seconds  Functional Activities: Supine shoulder flexion (for reaching and overhead function, palms in, protract 1st) 10 x 10 seconds Thumb up the back/shoulder IR (for reaching back) 10 x 10 seconds Supine arm raises/scapular protraction (reaching and overhead function) 10 x with 3# and 10 X with 4#  97535: Reviewed today's objective findings and made updates and changes to her home exercise program   07/28/2024 Seated straight leg raises 3 sets of 5 for 3 seconds (pillows for support) Prone quadriceps stretch 5 x 20 seconds Shoulder external rotation yellow Thera-Band 2 sets of 10 for 3 seconds Shoulder internal rotation yellow Thera-Band 10 x 3 seconds  Functional Activities: Supine shoulder flexion (for reaching and overhead function, palms in, protract 1st) 10 x 10 seconds Thumb up the back/shoulder IR (for reaching back) 10 x 10 seconds Posterior capsule stretch (for reaching) 5 x 10 seconds  02464: Reviewed objective findings and made updates and changes to her home exercise program   07/21/2024 Seated straight leg raises 3 sets of 5 for 3 seconds  (pillows for support) Prone quadriceps stretch 2 x 20 seconds  Functional Activities: Supine shoulder flexion with dowel (for reaching and overhead function, palms in, protract 1st) 10 x 5 seconds Thumb up the back/shoulder IR (for reaching back) 10 x 10 seconds Posterior capsule stretch (for reaching) 5 x 10 seconds  02464: Verbally reviewed day 1 HEP, changes made today, examination findings and HEP changes based on progress and re-exam  PATIENT EDUCATION:  Education details: HEP Person educated: Patient Education method: Explanation, Demonstration, and Handouts Education comprehension: verbalized understanding, returned demonstration, and needs  further education  HOME EXERCISE PROGRAM: Access Code: [email or web address] URL: [email or web address] Date: 08/08/2024 Prepared by: Corean Ku  Exercises - Heel Toe Raises with Counter Support  - 1 x daily - 1 x weekly - 2 sets - 10 reps - 2-3 sec hold - Seated Straight Leg Raise   - 1-2 x daily - 7 x weekly - 5 sets - 10 reps - 2-3 sec hold - Supine Quadriceps Stretch with Strap on Table  - 1 x daily - 1 x weekly - 1 sets - 3 reps - 30 sec hold - Standing Shoulder Abduction ROM with Dowel (Mirrored)  - 1-2 x daily - 1 x weekly - 2 sets - 10 reps - 1-2 sec hold - Standing Shoulder Flexion AAROM with Dowel  - 2 x daily - 7 x weekly - 1 sets - 10 reps - 5 sec hold - Small Range Straight Leg Raise  - 1 x daily - 7 x weekly - 5 sets - 10 reps - 3 seconds hold - Standing Shoulder Internal Rotation Stretch with Hands Behind Back  - 2 x daily - 7 x weekly - 1 sets - 10 reps - 10 seconds hold - Standing Shoulder Posterior Capsule Stretch  - 1 x daily - 1 x weekly - 1 sets - 5-10 reps - 10 hold - Shoulder External Rotation with Anchored Resistance  - 1 x daily - 7 x weekly - 2 sets - 10 reps - 3 hold - Shoulder Internal Rotation with Resistance  - 1 x daily - 7 x weekly - 1 sets - 10 reps - 3 hold - Supine Scapular Protraction in Flexion  with Dumbbells  - 1-2 x daily - 7 x weekly - 2 sets - 20 reps - 3 seconds hold - Wide Stance with Eyes Closed and Head Rotation on Foam Pad  - 2 x daily - 7 x weekly - 1 sets - 1 reps - 10 rotations each way - Romberg Stance Eyes Closed on Foam Pad  - 2 x daily - 7 x weekly - 1 sets - 5 reps - 10-15 sec hold - Romberg Stance on Foam Pad with Head Rotation  - 2 x daily - 7 x weekly - 1 sets - 1 reps - 10 rotations each way - Walking with Head Rotation  - 1 x daily - 7 x weekly - 1 sets - 3 reps - 10-20 feet  ASSESSMENT:  CLINICAL IMPRESSION: Pt has met 1 LTG at this time, she is overall doing well and feel she can transition to home program at this time.  Will hold x 30 days and pt will call if she needs to return.    Patient is a 78 y.o. female who was seen today for physical therapy evaluation and treatment for Rt humerus fx and Lt patellar fx. She demonstrates decreased ROM and strength, as well as c/o balance deficits with recent fall affecting functional mobility.  She will benefit from PT to address deficits listed.     OBJECTIVE IMPAIRMENTS: decreased balance, decreased mobility, decreased ROM, decreased strength, impaired UE functional use, postural dysfunction, and pain.   ACTIVITY LIMITATIONS: carrying, lifting, stairs, bathing, toileting, reach over head, hygiene/grooming, and locomotion level  PARTICIPATION LIMITATIONS: meal prep, cleaning, laundry, shopping, and community activity  PERSONAL FACTORS: Age, Time since onset of injury/illness/exacerbation, and 3+ comorbidities: cancer, essential tremor, neuropathy, PAC, nonrheumatic mitral valve regurgitation s/p mitral valve repair (2021), radiculopathy (cervical) are also affecting patient's functional outcome.  REHAB POTENTIAL: Good  CLINICAL DECISION MAKING: Evolving/moderate complexity  EVALUATION COMPLEXITY: Moderate   GOALS: Goals reviewed with patient? Yes  SHORT TERM GOALS: Target date: 07/26/2024  Independent  with initial HEP Goal status: Met 07/28/2024  2.  Perform Rt shoulder AROM without pain and improved 10 deg abduction and flexion Goal status: Met 07/28/2024  3. Formal balance assessment with LTGs to be written as appropriate Goal status: Ongoing 07/28/2024  LONG TERM GOALS: Target date: 09/01/2024  Independent with final HEP Goal status: Ongoing 08/04/2024  2.  PSFS score improved by 2 points Goal status: MET 08/08/24  3.  Rt shoulder strength improved to 4/5 for improved function and mobility Goal status: Partially met 08/04/2024  4.  Report pain < 3/10 with overhead reaching for improved function Goal status: Partially met 08/04/2024  5.  Demonstrate ability to negotiate stairs without increase in pain. Goal status: Ongoing 08/04/2024    PLAN:  PT FREQUENCY: 1x/week  PT DURATION: 4 additional weeks  PLANNED INTERVENTIONS: 97164- PT Re-evaluation, 97750- Physical Performance Testing, 97110-Therapeutic exercises, 97530- Therapeutic activity, 97112- Neuromuscular re-education, 97535- Self Care, 02859- Manual therapy, [phone]- Gait training, [phone]- Aquatic Therapy, [phone]- Electrical stimulation (unattended), [phone] (1-2 muscles), 20561 (3+ muscles)- Dry Needling, Patient/Family education, Balance training, Stair training, Taping, Cryotherapy, and Moist heat.  PLAN FOR NEXT SESSION: hold PT x 30 days     Corean JULIANNA Ku, PT, DPT 08/08/24 11:26 AM

## 2024-08-18 DIAGNOSIS — N762 Acute vulvitis: Secondary | ICD-10-CM | POA: Diagnosis not present

## 2024-08-18 DIAGNOSIS — N760 Acute vaginitis: Secondary | ICD-10-CM | POA: Diagnosis not present

## 2024-08-31 ENCOUNTER — Ambulatory Visit: Admitting: Orthopaedic Surgery

## 2024-08-31 ENCOUNTER — Encounter: Payer: Self-pay | Admitting: Orthopaedic Surgery

## 2024-08-31 DIAGNOSIS — G8929 Other chronic pain: Secondary | ICD-10-CM | POA: Diagnosis not present

## 2024-08-31 DIAGNOSIS — M25511 Pain in right shoulder: Secondary | ICD-10-CM

## 2024-08-31 MED ORDER — METHYLPREDNISOLONE ACETATE 40 MG/ML IJ SUSP
40.0000 mg | INTRAMUSCULAR | Status: AC | PRN
  Administered 2024-08-31: 40 mg via INTRA_ARTICULAR

## 2024-08-31 MED ORDER — LIDOCAINE HCL 1 % IJ SOLN
3.0000 mL | INTRAMUSCULAR | Status: AC | PRN
  Administered 2024-08-31: 3 mL

## 2024-08-31 NOTE — Progress Notes (Signed)
 The patient is a 78 year old who is now about 54-month status post a mechanical fall in which she sustained a nondisplaced greater tuberosity fracture.  That her fracture eventually healed and she went through physical therapy which also helped in terms of getting her motion back.  She does report clicking in the shoulder but she does get aching in the right shoulder.  On my exam today she actually abducts and forward flexes as well as externally rotate her shoulder very well.  There is some pain associated with it but the rotator cuff itself feels intact.  There are some clicking in the shoulder.  I did recommend a steroid injection in the subacromial outlet today which she agreed to and tolerated very well.  If this is not getting better after the first of the year, she will contact us  and we will see her back with new x-rays and potentially MRI of the shoulder.  It does look like though she will continue to improve based on what we are seeing thus far.  All questions and concerns were addressed and answered.    Procedure Note  Patient: Kellie Humphrey             Date of Birth: [birth year]           MRN: [id number]             Visit Date: 08/31/2024  Procedures: Visit Diagnoses:  1. Chronic right shoulder pain     Large Joint Inj: R subacromial bursa on 08/31/2024 10:55 AM Indications: pain and diagnostic evaluation Details: 22 G 1.5 in needle  Arthrogram: No  Medications: 3 mL lidocaine  1 %; 40 mg methylPREDNISolone  acetate 40 MG/ML Outcome: tolerated well, no immediate complications Procedure, treatment alternatives, risks and benefits explained, specific risks discussed. Consent was given by the patient. Immediately prior to procedure a time out was called to verify the correct patient, procedure, equipment, support staff and site/side marked as required. Patient was prepped and draped in the usual sterile fashion.

## 2024-09-08 NOTE — Progress Notes (Unsigned)
 Cardiology Office Note:  .   Date:  09/08/2024  ID:  Romero Karon Blades, DOB June 08, 1946, MRN [id number] PCP: Loreli Elsie JONETTA Mickey., MD  [facility] Providers Cardiologist:  Lurena MARLA Red, MD Electrophysiologist:  Danelle Birmingham, MD {  History of Present Illness: .   Janean Eischen is a 78 y.o. female w/PMHx of  hypothyroidism VHD (s/p MV repair 2021) SVT, PACs, PVCs Retinal artery occl (presumptive cardioembolic) AFib  She saw Dr. Birmingham 12/15/23, recent transient loss of vision found with retinal artery occl, certain this was cardio embolic > started on OAC though felt to be a poor longterm a/c candidate 2/2 hx of fall resulting on intracranial hemorrhage,  and medication interactions and referred to Dr. Kennyth to consider watchman implant  She saw Dr. Kennyth 12/18/23, felt a candidate and planned to pursue implant  LAAO on 01/28/24, discharged same day Post Implant Anticoagulation Strategy: Continue Eliquis  5mg  twice daily for 45 days, then transition to dual anti-platelet therapy with aspirin  81mg  and clopidogrel  75mg  once daily for 6 months.    I saw her 03/16/24 She is doing well, looks forward to getting off her Eliquis  No procedural concerns, site healed well No CP Reports daily episodes of AFib ~ , less then an hour, this is an improvement No near syncope or syncope She was transitioned to ASA/Plavix  off Eliquis    CT: 04/06/24 IMPRESSION: 1. A 27mm Watchman FLX device is present with no evidence of peridevice leak. There is contrast past the device, suggestive of delayed endothelialization. No device related thrombus 2.  Coronary calcium  score 8 (29th percentile)  Unfortunately had a fall in August and arm fracture >> reported extensive bruising that was slow to resolve > Dr. Kennyth cleared for Plavix  alone  I saw her 08/03/24 Her arm is healed, bruising resolved No CP, SOB She reports a few months that her palpitations seemed to settle down with none,  felt great, though in the last couple months again daily More noted in the afternoons, she reached out and Dr. Birmingham advised she take 25mg  flecainide  mid-day > this has not helped much if at all It is not racing but feels irregular heart beat that can last hours Seems to be w/increasing duration and frequency again No near syncope or syncope Watch tracings c/w PVCs Flecainide  adjusted to 100mg  BID, her metoprolol   to 25mg  TID  Today's visit is scheduled as a planned f/u ROS:   *** flecainide , nodal blocker EKG *** symptoms, PVCs *** watchman   Arrhythmia/AAD hx Flecainide  started 2019 LAAO on 01/28/24  Studies Reviewed: SABRA    EKG done today and reviewed by myself:  ***  08/03/24:SR 64bpm, PR , QRS , QTc 03/16/24: SB 58bpm, PR , QRS , QTc   01/28/24: Watchman Transeptal Puncture Intra-procedural TEE which showed no LAA thrombus Left atrial appendage occlusive device placement on 01/28/24 by Dr. Kennyth.  CONCLUSIONS:  1.Successful implantation of a WATCHMAN left atrial appendage occlusive device    2. TEE demonstrating no LAA thrombus 3. No early apparent complications.   Echo 11/22/23:  1. Left ventricular ejection fraction, by estimation, is 55 to 60%. The  left ventricle has normal function. The left ventricle has no regional  wall motion abnormalities. Left ventricular diastolic function could not  be evaluated.   2. Right ventricular systolic function is normal. The right ventricular  size is normal. There is normal pulmonary artery systolic pressure. The  estimated right ventricular systolic pressure is 31.5 mmHg.  3. The mitral valve has been repaired/replaced. No evidence of mitral  valve regurgitation. The mean mitral valve gradient is 6.0 mmHg with  average heart rate of 82 bpm. There is a 32 mm [name] prosthetic  annuloplasty ring present in the mitral position. Procedure Date: 02/10/20.   4. The aortic valve is tricuspid. There is  mild calcification of the  aortic valve. Aortic valve regurgitation is not visualized. No aortic  stenosis is present.    Risk Assessment/Calculations:    Physical Exam:   VS:  There were no vitals taken for this visit.   Wt Readings from Last 3 Encounters:  08/03/24 131 lb (59.4 kg)  06/13/24 130 lb (59 kg)  04/30/24 130 lb (59 kg)    GEN: Well nourished, well developed in no acute distress NECK: No JVD; No carotid bruits CARDIAC: *** RRR, no murmurs, rubs, gallops RESPIRATORY: *** CTA b/l without rales, wheezing or rhonchi  ABDOMEN: Soft, non-tender, non-distended EXTREMITIES: *** No edema; No deformity   ASSESSMENT AND PLAN: .    paroxysmal AFib Atrial arrhythmias CHA2DS2Vasc is 5 Chronic flecainide /metoprolol , w/*** stable intervals S/p Watchman ***   VHD S/p MV repair Stable by her echo 2025 C/w Dr. Audria  Secondary hypercoagulable state 2/2 AFib  5. PVCs Palpitations ***     Dispo: back in 3-4 weeks, sooner if needed  Signed, Charlies Macario Arthur, PA-C

## 2024-09-09 ENCOUNTER — Ambulatory Visit: Attending: Physician Assistant

## 2024-09-09 ENCOUNTER — Ambulatory Visit: Attending: Physician Assistant | Admitting: Physician Assistant

## 2024-09-09 VITALS — BP 118/66 | Ht 67.0 in | Wt 129.0 lb

## 2024-09-09 DIAGNOSIS — Z9889 Other specified postprocedural states: Secondary | ICD-10-CM | POA: Diagnosis not present

## 2024-09-09 DIAGNOSIS — R002 Palpitations: Secondary | ICD-10-CM

## 2024-09-09 DIAGNOSIS — I493 Ventricular premature depolarization: Secondary | ICD-10-CM

## 2024-09-09 DIAGNOSIS — Z5181 Encounter for therapeutic drug level monitoring: Secondary | ICD-10-CM

## 2024-09-09 DIAGNOSIS — I480 Paroxysmal atrial fibrillation: Secondary | ICD-10-CM

## 2024-09-09 DIAGNOSIS — Z79899 Other long term (current) drug therapy: Secondary | ICD-10-CM

## 2024-09-09 MED ORDER — METOPROLOL TARTRATE 50 MG PO TABS: 50.0000 mg | ORAL_TABLET | Freq: Two times a day (BID) | ORAL | 3 refills | Status: AC

## 2024-09-09 NOTE — Patient Instructions (Addendum)
 Medication Instructions:    START TAKING :   METOPROLOL  50 MG TWICE  A  DAY     *If you need a refill on your cardiac medications before your next appointment, please call your pharmacy*    Lab Work: NONE ORDERED  TODAY   If you have labs (blood work) drawn today and your tests are completely normal, you will receive your results only by: MyChart Message (if you have MyChart) OR A paper copy in the mail If you have any lab test that is abnormal or we need to change your treatment, we will call you to review the results.    Testing/Procedures: Your physician has recommended that you wear an event monitor. Event monitors are medical devices that record the hearts electrical activity. Doctors most often us  these monitors to diagnose arrhythmias. Arrhythmias are problems with the speed or rhythm of the heartbeat. The monitor is a small, portable device. You can wear one while you do your normal daily activities. This is usually used to diagnose what is causing palpitations/syncope (passing out).     Follow-Up: At [facility], you and your health needs are our priority.  As part of our continuing mission to provide you with exceptional heart care, our providers are all part of one team.  This team includes your primary Cardiologist (physician) and Advanced Practice Providers or APPs (Physician Assistants and Nurse Practitioners) who all work together to provide you with the care you need, when you need it.  Your next appointment:  3 -4 month(s)  Provider:    Charlies Arthur, PA-C  ( CONTACT  CASSIE HALL/ ANGELINE HAMMER FOR EP SCHEDULING ISSUES )     We recommend signing up for the patient portal called MyChart.  Sign up information is provided on this After Visit Summary.  MyChart is used to connect with patients for Virtual Visits (Telemedicine).  Patients are able to view lab/test results, encounter notes, upcoming appointments, etc.  Non-urgent messages can be sent to your  provider as well.   To learn more about what you can do with MyChart, go to [email or web address].   Other Instructions     ZIO XT- Long Term Monitor Instructions  Your physician has requested you wear a ZIO patch monitor for  7  days.  This is a single patch monitor. [employer] supplies one patch monitor per enrollment. Additional stickers are not available. Please do not apply patch if you will be having a Nuclear Stress Test,  Echocardiogram, Cardiac CT, MRI, or Chest Xray during the period you would be wearing the  monitor. The patch cannot be worn during these tests. You cannot remove and re-apply the  ZIO XT patch monitor.  Your ZIO patch monitor will be mailed 3 day USPS to your address on file. It may take 3-5 days  to receive your monitor after you have been enrolled.  Once you have received your monitor, please review the enclosed instructions. Your monitor  has already been registered assigning a specific monitor serial # to you.  Billing and Patient Assistance Program Information  We have supplied [employer] with any of your insurance information on file for billing purposes. [employer] offers a sliding scale Patient Assistance Program for patients that do not have  insurance, or whose insurance does not completely cover the cost of the ZIO monitor.  You must apply for the Patient Assistance Program to qualify for this discounted rate.  To apply, please call [employer] at [phone], select option 4, select  option 2, ask to apply for  Patient Assistance Program. Meredeth will ask your household income, and how many people  are in your household. They will quote your out-of-pocket cost based on that information.  [employer] will also be able to set up a 53-month, interest-free payment plan if needed.  Applying the monitor   Shave hair from upper left chest.  Hold abrader disc by orange tab. Rub abrader in 40 strokes over the upper left chest as  indicated in your monitor  instructions.  Clean area with 4 enclosed alcohol  pads. Let dry.  Apply patch as indicated in monitor instructions. Patch will be placed under collarbone on left  side of chest with arrow pointing upward.  Rub patch adhesive wings for 2 minutes. Remove white label marked 1. Remove the white  label marked 2. Rub patch adhesive wings for 2 additional minutes.  While looking in a mirror, press and release button in center of patch. A small green light will  flash 3-4 times. This will be your only indicator that the monitor has been turned on.  Do not shower for the first 24 hours. You may shower after the first 24 hours.  Press the button if you feel a symptom. You will hear a small click. Record Date, Time and  Symptom in the Patient Logbook.  When you are ready to remove the patch, follow instructions on the last 2 pages of Patient  Logbook. Stick patch monitor onto the last page of Patient Logbook.  Place Patient Logbook in the blue and white box. Use locking tab on box and tape box closed  securely. The blue and white box has prepaid postage on it. Please place it in the mailbox as  soon as possible. Your physician should have your test results approximately 7 days after the  monitor has been mailed back to [facility].  Call [facility] Customer Care at [phone] if you have questions regarding  your ZIO XT patch monitor. Call them immediately if you see an orange light blinking on your  monitor.  If your monitor falls off in less than 4 days, contact our Monitor department at [phone].  If your monitor becomes loose or falls off after 4 days call [employer] at [phone] for  suggestions on securing your monitor

## 2024-09-09 NOTE — Progress Notes (Unsigned)
 Enrolled patient for a 7 day Zio XT monitor to be mailed to patients home  Kellie Humphrey to read

## 2024-09-28 DIAGNOSIS — R002 Palpitations: Secondary | ICD-10-CM

## 2024-09-28 DIAGNOSIS — I493 Ventricular premature depolarization: Secondary | ICD-10-CM | POA: Diagnosis not present

## 2024-09-30 ENCOUNTER — Ambulatory Visit: Payer: Self-pay | Admitting: Physician Assistant

## 2024-11-04 ENCOUNTER — Other Ambulatory Visit: Payer: Self-pay

## 2024-11-04 ENCOUNTER — Other Ambulatory Visit: Payer: Self-pay | Admitting: Pharmacy Technician

## 2024-11-04 ENCOUNTER — Other Ambulatory Visit (HOSPITAL_COMMUNITY): Payer: Self-pay

## 2024-11-04 MED ORDER — JUBBONTI 60 MG/ML ~~LOC~~ SOSY: 60.0000 mg | PREFILLED_SYRINGE | SUBCUTANEOUS | 0 refills | Status: AC

## 2024-12-12 ENCOUNTER — Ambulatory Visit: Admitting: Physician Assistant
